# Patient Record
Sex: Female | Born: 1940
Health system: Southern US, Community
[De-identification: ages and names within clinical notes are randomized; demographics above are authoritative.]

## PROBLEM LIST (undated history)

## (undated) DIAGNOSIS — K219 Gastro-esophageal reflux disease without esophagitis: Secondary | ICD-10-CM

## (undated) DIAGNOSIS — M8580 Other specified disorders of bone density and structure, unspecified site: Secondary | ICD-10-CM

## (undated) DIAGNOSIS — N189 Chronic kidney disease, unspecified: Secondary | ICD-10-CM

## (undated) DIAGNOSIS — E785 Hyperlipidemia, unspecified: Secondary | ICD-10-CM

## (undated) DIAGNOSIS — K861 Other chronic pancreatitis: Secondary | ICD-10-CM

## (undated) DIAGNOSIS — T8859XA Other complications of anesthesia, initial encounter: Secondary | ICD-10-CM

## (undated) DIAGNOSIS — E079 Disorder of thyroid, unspecified: Secondary | ICD-10-CM

## (undated) DIAGNOSIS — Z974 Presence of external hearing-aid: Secondary | ICD-10-CM

## (undated) DIAGNOSIS — I10 Essential (primary) hypertension: Secondary | ICD-10-CM

## (undated) DIAGNOSIS — M1990 Unspecified osteoarthritis, unspecified site: Secondary | ICD-10-CM

## (undated) HISTORY — PX: CHOLECYSTECTOMY: SHX55

## (undated) HISTORY — DX: Other chronic pancreatitis: K86.1

## (undated) HISTORY — DX: Hyperlipidemia, unspecified: E78.5

## (undated) HISTORY — PX: TONSILECTOMY, ADENOIDECTOMY, BILATERAL MYRINGOTOMY AND TUBES: SHX2538

## (undated) HISTORY — DX: Other specified disorders of bone density and structure, unspecified site: M85.80

## (undated) HISTORY — PX: TUBAL LIGATION: SHX77

## (undated) HISTORY — DX: Chronic kidney disease, unspecified: N18.9

## (undated) HISTORY — DX: Disorder of thyroid, unspecified: E07.9

## (undated) HISTORY — PX: TONSILLECTOMY AND ADENOIDECTOMY: SHX28

## (undated) HISTORY — DX: Unspecified osteoarthritis, unspecified site: M19.90

## (undated) HISTORY — DX: Gastro-esophageal reflux disease without esophagitis: K21.9

## (undated) HISTORY — PX: APPENDECTOMY: SHX54

## (undated) HISTORY — PX: WHIPPLE PROCEDURE: SHX2667

---

## 1996-08-27 HISTORY — PX: WHIPPLE PROCEDURE: SHX2667

## 1999-03-10 ENCOUNTER — Other Ambulatory Visit: Admission: RE | Admit: 1999-03-10 | Discharge: 1999-03-10 | Payer: Self-pay | Admitting: Internal Medicine

## 2001-07-15 ENCOUNTER — Encounter: Payer: Self-pay | Admitting: *Deleted

## 2001-07-15 ENCOUNTER — Ambulatory Visit (HOSPITAL_COMMUNITY): Admission: RE | Admit: 2001-07-15 | Discharge: 2001-07-15 | Payer: Self-pay | Admitting: *Deleted

## 2002-12-08 ENCOUNTER — Emergency Department (HOSPITAL_COMMUNITY): Admission: EM | Admit: 2002-12-08 | Discharge: 2002-12-08 | Payer: Self-pay | Admitting: Emergency Medicine

## 2002-12-08 ENCOUNTER — Encounter: Payer: Self-pay | Admitting: Emergency Medicine

## 2003-10-26 ENCOUNTER — Encounter: Admission: RE | Admit: 2003-10-26 | Discharge: 2003-10-26 | Payer: Self-pay | Admitting: Family Medicine

## 2003-10-29 ENCOUNTER — Inpatient Hospital Stay (HOSPITAL_COMMUNITY): Admission: EM | Admit: 2003-10-29 | Discharge: 2003-11-01 | Payer: Self-pay | Admitting: Emergency Medicine

## 2003-11-03 ENCOUNTER — Observation Stay (HOSPITAL_COMMUNITY): Admission: RE | Admit: 2003-11-03 | Discharge: 2003-11-04 | Payer: Self-pay | Admitting: *Deleted

## 2003-11-04 ENCOUNTER — Encounter (INDEPENDENT_AMBULATORY_CARE_PROVIDER_SITE_OTHER): Payer: Self-pay | Admitting: *Deleted

## 2005-03-28 ENCOUNTER — Emergency Department (HOSPITAL_COMMUNITY): Admission: EM | Admit: 2005-03-28 | Discharge: 2005-03-28 | Payer: Self-pay | Admitting: Emergency Medicine

## 2005-10-23 ENCOUNTER — Encounter: Admission: RE | Admit: 2005-10-23 | Discharge: 2006-01-21 | Payer: Self-pay | Admitting: Family Medicine

## 2006-02-06 ENCOUNTER — Ambulatory Visit: Payer: Self-pay | Admitting: Family Medicine

## 2006-05-14 ENCOUNTER — Ambulatory Visit: Payer: Self-pay | Admitting: Family Medicine

## 2006-07-19 ENCOUNTER — Ambulatory Visit: Payer: Self-pay | Admitting: Family Medicine

## 2006-08-06 ENCOUNTER — Ambulatory Visit: Payer: Self-pay | Admitting: Family Medicine

## 2006-08-21 ENCOUNTER — Ambulatory Visit: Payer: Self-pay | Admitting: Family Medicine

## 2006-09-24 ENCOUNTER — Encounter: Admission: RE | Admit: 2006-09-24 | Discharge: 2006-12-23 | Payer: Self-pay | Admitting: Family Medicine

## 2006-10-11 ENCOUNTER — Encounter: Admission: RE | Admit: 2006-10-11 | Discharge: 2006-10-11 | Payer: Self-pay | Admitting: Physician Assistant

## 2006-10-11 ENCOUNTER — Ambulatory Visit: Payer: Self-pay | Admitting: Family Medicine

## 2006-11-13 ENCOUNTER — Ambulatory Visit: Payer: Self-pay | Admitting: Family Medicine

## 2006-12-18 ENCOUNTER — Ambulatory Visit: Payer: Self-pay | Admitting: Family Medicine

## 2007-01-28 ENCOUNTER — Ambulatory Visit: Payer: Self-pay | Admitting: Family Medicine

## 2007-02-19 ENCOUNTER — Ambulatory Visit: Payer: Self-pay | Admitting: Family Medicine

## 2007-04-23 ENCOUNTER — Ambulatory Visit: Payer: Self-pay | Admitting: Family Medicine

## 2007-07-21 ENCOUNTER — Ambulatory Visit: Payer: Self-pay | Admitting: Family Medicine

## 2007-07-30 ENCOUNTER — Ambulatory Visit: Payer: Self-pay | Admitting: Family Medicine

## 2007-08-15 ENCOUNTER — Ambulatory Visit: Payer: Self-pay | Admitting: Family Medicine

## 2007-10-29 ENCOUNTER — Ambulatory Visit: Payer: Self-pay | Admitting: Family Medicine

## 2008-01-28 ENCOUNTER — Ambulatory Visit: Payer: Self-pay | Admitting: Family Medicine

## 2008-02-16 ENCOUNTER — Ambulatory Visit (HOSPITAL_BASED_OUTPATIENT_CLINIC_OR_DEPARTMENT_OTHER): Admission: RE | Admit: 2008-02-16 | Discharge: 2008-02-17 | Payer: Self-pay | Admitting: Orthopedic Surgery

## 2008-03-19 ENCOUNTER — Ambulatory Visit (HOSPITAL_COMMUNITY): Admission: RE | Admit: 2008-03-19 | Discharge: 2008-03-19 | Payer: Self-pay | Admitting: Orthopedic Surgery

## 2008-05-05 ENCOUNTER — Ambulatory Visit: Payer: Self-pay | Admitting: Family Medicine

## 2008-08-24 ENCOUNTER — Ambulatory Visit: Payer: Self-pay | Admitting: Family Medicine

## 2008-09-07 LAB — HM DEXA SCAN

## 2008-10-20 ENCOUNTER — Inpatient Hospital Stay (HOSPITAL_COMMUNITY): Admission: EM | Admit: 2008-10-20 | Discharge: 2008-10-23 | Payer: Self-pay | Admitting: Emergency Medicine

## 2008-11-08 ENCOUNTER — Ambulatory Visit: Payer: Self-pay | Admitting: Family Medicine

## 2008-11-19 ENCOUNTER — Encounter (INDEPENDENT_AMBULATORY_CARE_PROVIDER_SITE_OTHER): Payer: Self-pay | Admitting: Internal Medicine

## 2008-12-22 ENCOUNTER — Ambulatory Visit: Payer: Self-pay | Admitting: Family Medicine

## 2008-12-24 ENCOUNTER — Ambulatory Visit: Payer: Self-pay | Admitting: Family Medicine

## 2009-01-12 ENCOUNTER — Ambulatory Visit: Payer: Self-pay | Admitting: Family Medicine

## 2009-01-26 ENCOUNTER — Ambulatory Visit: Payer: Self-pay | Admitting: Family Medicine

## 2009-04-25 ENCOUNTER — Ambulatory Visit: Payer: Self-pay | Admitting: Family Medicine

## 2009-07-25 ENCOUNTER — Ambulatory Visit: Payer: Self-pay | Admitting: Family Medicine

## 2009-09-14 ENCOUNTER — Encounter: Admission: RE | Admit: 2009-09-14 | Discharge: 2009-09-14 | Payer: Self-pay | Admitting: Family Medicine

## 2009-09-14 ENCOUNTER — Ambulatory Visit: Payer: Self-pay | Admitting: Family Medicine

## 2009-10-24 ENCOUNTER — Ambulatory Visit: Payer: Self-pay | Admitting: Family Medicine

## 2009-11-08 ENCOUNTER — Encounter: Admission: RE | Admit: 2009-11-08 | Discharge: 2009-11-08 | Payer: Self-pay | Admitting: Otolaryngology

## 2009-11-16 ENCOUNTER — Ambulatory Visit: Payer: Self-pay | Admitting: Family Medicine

## 2010-01-19 ENCOUNTER — Ambulatory Visit: Payer: Self-pay | Admitting: Family Medicine

## 2010-04-26 ENCOUNTER — Ambulatory Visit: Payer: Self-pay | Admitting: Physician Assistant

## 2010-09-17 ENCOUNTER — Encounter: Payer: Self-pay | Admitting: Neurology

## 2010-09-19 ENCOUNTER — Ambulatory Visit
Admission: RE | Admit: 2010-09-19 | Discharge: 2010-09-19 | Payer: Self-pay | Source: Home / Self Care | Attending: Family Medicine | Admitting: Family Medicine

## 2010-09-20 ENCOUNTER — Ambulatory Visit
Admission: RE | Admit: 2010-09-20 | Discharge: 2010-09-20 | Payer: Self-pay | Source: Home / Self Care | Attending: Family Medicine | Admitting: Family Medicine

## 2010-12-12 LAB — POCT I-STAT, CHEM 8
BUN: 32 mg/dL — ABNORMAL HIGH (ref 6–23)
Calcium, Ion: 1.19 mmol/L (ref 1.12–1.32)
Creatinine, Ser: 1.1 mg/dL (ref 0.4–1.2)
Glucose, Bld: 192 mg/dL — ABNORMAL HIGH (ref 70–99)
HCT: 35 % — ABNORMAL LOW (ref 36.0–46.0)
Hemoglobin: 11.9 g/dL — ABNORMAL LOW (ref 12.0–15.0)
Potassium: 4.3 mEq/L (ref 3.5–5.1)
TCO2: 20 mmol/L (ref 0–100)

## 2010-12-12 LAB — GLUCOSE, CAPILLARY
Glucose-Capillary: 100 mg/dL — ABNORMAL HIGH (ref 70–99)
Glucose-Capillary: 111 mg/dL — ABNORMAL HIGH (ref 70–99)
Glucose-Capillary: 123 mg/dL — ABNORMAL HIGH (ref 70–99)
Glucose-Capillary: 37 mg/dL — CL (ref 70–99)
Glucose-Capillary: 40 mg/dL — ABNORMAL LOW (ref 70–99)
Glucose-Capillary: 46 mg/dL — ABNORMAL LOW (ref 70–99)
Glucose-Capillary: 76 mg/dL (ref 70–99)
Glucose-Capillary: 78 mg/dL (ref 70–99)
Glucose-Capillary: 94 mg/dL (ref 70–99)
Glucose-Capillary: 96 mg/dL (ref 70–99)

## 2010-12-12 LAB — HEMOGLOBIN AND HEMATOCRIT, BLOOD
HCT: 24.8 % — ABNORMAL LOW (ref 36.0–46.0)
HCT: 26.7 % — ABNORMAL LOW (ref 36.0–46.0)
HCT: 29.9 % — ABNORMAL LOW (ref 36.0–46.0)
HCT: 30.2 % — ABNORMAL LOW (ref 36.0–46.0)
HCT: 34.1 % — ABNORMAL LOW (ref 36.0–46.0)
Hemoglobin: 10.1 g/dL — ABNORMAL LOW (ref 12.0–15.0)
Hemoglobin: 10.2 g/dL — ABNORMAL LOW (ref 12.0–15.0)
Hemoglobin: 11.7 g/dL — ABNORMAL LOW (ref 12.0–15.0)
Hemoglobin: 11.7 g/dL — ABNORMAL LOW (ref 12.0–15.0)

## 2010-12-12 LAB — BASIC METABOLIC PANEL
BUN: 6 mg/dL (ref 6–23)
CO2: 19 mEq/L (ref 19–32)
CO2: 21 mEq/L (ref 19–32)
Calcium: 7.8 mg/dL — ABNORMAL LOW (ref 8.4–10.5)
Calcium: 8 mg/dL — ABNORMAL LOW (ref 8.4–10.5)
Chloride: 111 mEq/L (ref 96–112)
Chloride: 116 mEq/L — ABNORMAL HIGH (ref 96–112)
Creatinine, Ser: 0.7 mg/dL (ref 0.4–1.2)
Creatinine, Ser: 0.89 mg/dL (ref 0.4–1.2)
GFR calc Af Amer: >60 mL/min (ref >60)
GFR calc non Af Amer: >60 mL/min (ref >60)
GFR calc non Af Amer: >60 mL/min (ref >60)
Glucose, Bld: 132 mg/dL — ABNORMAL HIGH (ref 70–99)
Glucose, Bld: 148 mg/dL — ABNORMAL HIGH (ref 70–99)
Potassium: 4.9 mEq/L (ref 3.5–5.1)
Sodium: 139 mEq/L (ref 135–145)
Sodium: 140 mEq/L (ref 135–145)

## 2010-12-12 LAB — CBC
HCT: 25.1 % — ABNORMAL LOW (ref 36.0–46.0)
HCT: 29 % — ABNORMAL LOW (ref 36.0–46.0)
HCT: 32.5 % — ABNORMAL LOW (ref 36.0–46.0)
Hemoglobin: 11.4 g/dL — ABNORMAL LOW (ref 12.0–15.0)
Hemoglobin: 8.8 g/dL — ABNORMAL LOW (ref 12.0–15.0)
Hemoglobin: 9.8 g/dL — ABNORMAL LOW (ref 12.0–15.0)
Hemoglobin: 9.8 g/dL — ABNORMAL LOW (ref 12.0–15.0)
MCV: 94.4 fL (ref 78.0–100.0)
Platelets: 203 10*3/uL (ref 150–400)
Platelets: 282 10*3/uL (ref 150–400)
RBC: 2.61 MIL/uL — ABNORMAL LOW (ref 3.87–5.11)
RBC: 3.26 MIL/uL — ABNORMAL LOW (ref 3.87–5.11)
RBC: 3.39 MIL/uL — ABNORMAL LOW (ref 3.87–5.11)
RDW: 13.1 % (ref 11.5–15.5)
RDW: 13.6 % (ref 11.5–15.5)
RDW: 16.9 % — ABNORMAL HIGH (ref 11.5–15.5)
WBC: 10 10*3/uL (ref 4.0–10.5)
WBC: 12.6 10*3/uL — ABNORMAL HIGH (ref 4.0–10.5)
WBC: 6.9 10*3/uL (ref 4.0–10.5)
WBC: 8.6 10*3/uL (ref 4.0–10.5)

## 2010-12-12 LAB — HEMOCCULT GUIAC POC 1CARD (OFFICE): Fecal Occult Bld: POSITIVE

## 2010-12-12 LAB — CLOTEST (H. PYLORI), BIOPSY: Helicobacter screen: NEGATIVE

## 2010-12-12 LAB — DIFFERENTIAL
Basophils Absolute: 0 10*3/uL (ref 0.0–0.1)
Basophils Relative: 0 % (ref 0–1)
Eosinophils Relative: 2 % (ref 0–5)
Monocytes Absolute: 0.6 10*3/uL (ref 0.1–1.0)
Monocytes Relative: 7 % (ref 3–12)

## 2010-12-12 LAB — RETICULOCYTES: Retic Count, Absolute: 54.7 10*3/uL (ref 19.0–186.0)

## 2010-12-12 LAB — CROSSMATCH
ABO/RH(D): A POS
Antibody Screen: NEGATIVE

## 2010-12-12 LAB — COMPREHENSIVE METABOLIC PANEL
ALT: 21 U/L (ref 0–35)
AST: 22 U/L (ref 0–37)
Albumin: 2.6 g/dL — ABNORMAL LOW (ref 3.5–5.2)
Alkaline Phosphatase: 73 U/L (ref 39–117)
Chloride: 114 mEq/L — ABNORMAL HIGH (ref 96–112)
GFR calc Af Amer: >60 mL/min (ref >60)
Potassium: 4.2 mEq/L (ref 3.5–5.1)
Sodium: 138 mEq/L (ref 135–145)
Total Bilirubin: 0.3 mg/dL (ref 0.3–1.2)
Total Protein: 4.7 g/dL — ABNORMAL LOW (ref 6.0–8.3)

## 2010-12-12 LAB — CK TOTAL AND CKMB (NOT AT ARMC)
CK, MB: 1.9 ng/mL (ref 0.3–4.0)
CK, MB: 2.8 ng/mL (ref 0.3–4.0)
Relative Index: INVALID (ref 0.0–2.5)
Relative Index: INVALID (ref 0.0–2.5)
Total CK: 37 U/L (ref 7–177)

## 2010-12-12 LAB — POCT CARDIAC MARKERS
CKMB, poc: <1 ng/mL — ABNORMAL LOW (ref 1.0–8.0)
Troponin i, poc: <0.05 ng/mL (ref 0.00–0.09)

## 2010-12-12 LAB — TROPONIN I: Troponin I: <0.01 ng/mL (ref 0.00–0.06)

## 2010-12-12 LAB — VITAMIN B12: Vitamin B-12: 689 pg/mL (ref 211–911)

## 2010-12-12 LAB — LIPID PANEL
LDL Cholesterol: 44 mg/dL (ref 0–99)
VLDL: 26 mg/dL (ref 0–40)

## 2010-12-12 LAB — HEMOGLOBIN A1C: Hgb A1c MFr Bld: 7.4 % — ABNORMAL HIGH (ref 4.6–6.1)

## 2010-12-19 ENCOUNTER — Other Ambulatory Visit: Payer: Self-pay | Admitting: Family Medicine

## 2010-12-19 ENCOUNTER — Ambulatory Visit (INDEPENDENT_AMBULATORY_CARE_PROVIDER_SITE_OTHER): Payer: MEDICARE | Admitting: Family Medicine

## 2010-12-19 DIAGNOSIS — I10 Essential (primary) hypertension: Secondary | ICD-10-CM

## 2010-12-19 DIAGNOSIS — Z79899 Other long term (current) drug therapy: Secondary | ICD-10-CM

## 2010-12-19 DIAGNOSIS — E119 Type 2 diabetes mellitus without complications: Secondary | ICD-10-CM

## 2010-12-19 DIAGNOSIS — M899 Disorder of bone, unspecified: Secondary | ICD-10-CM

## 2010-12-19 DIAGNOSIS — E785 Hyperlipidemia, unspecified: Secondary | ICD-10-CM

## 2010-12-20 ENCOUNTER — Emergency Department (HOSPITAL_COMMUNITY)
Admission: EM | Admit: 2010-12-20 | Discharge: 2010-12-20 | Disposition: A | Discharge status: Home / Self Care | Payer: Medicare Other | Attending: Emergency Medicine | Admitting: Emergency Medicine

## 2010-12-20 ENCOUNTER — Emergency Department (HOSPITAL_COMMUNITY): Payer: Medicare Other

## 2010-12-20 ENCOUNTER — Encounter (HOSPITAL_COMMUNITY): Payer: Self-pay | Admitting: Radiology

## 2010-12-20 DIAGNOSIS — E039 Hypothyroidism, unspecified: Secondary | ICD-10-CM | POA: Insufficient documentation

## 2010-12-20 DIAGNOSIS — N1330 Unspecified hydronephrosis: Secondary | ICD-10-CM | POA: Insufficient documentation

## 2010-12-20 DIAGNOSIS — I10 Essential (primary) hypertension: Secondary | ICD-10-CM | POA: Insufficient documentation

## 2010-12-20 DIAGNOSIS — N200 Calculus of kidney: Secondary | ICD-10-CM | POA: Insufficient documentation

## 2010-12-20 DIAGNOSIS — E109 Type 1 diabetes mellitus without complications: Secondary | ICD-10-CM | POA: Insufficient documentation

## 2010-12-20 DIAGNOSIS — R109 Unspecified abdominal pain: Secondary | ICD-10-CM | POA: Insufficient documentation

## 2010-12-20 DIAGNOSIS — D72829 Elevated white blood cell count, unspecified: Secondary | ICD-10-CM | POA: Insufficient documentation

## 2010-12-20 DIAGNOSIS — R1032 Left lower quadrant pain: Secondary | ICD-10-CM | POA: Insufficient documentation

## 2010-12-20 HISTORY — DX: Essential (primary) hypertension: I10

## 2010-12-20 LAB — DIFFERENTIAL
Band Neutrophils: 2 % (ref 0–10)
Basophils Absolute: 0 10*3/uL (ref 0.0–0.1)
Basophils Relative: 0 % (ref 0–1)
Blasts: 0 %
Eosinophils Absolute: 0 10*3/uL (ref 0.0–0.7)
Eosinophils Relative: 0 % (ref 0–5)
Lymphocytes Relative: 17 % (ref 12–46)
Lymphs Abs: 2.1 10*3/uL (ref 0.7–4.0)
Metamyelocytes Relative: 0 %
Monocytes Absolute: 0.1 10*3/uL (ref 0.1–1.0)
Monocytes Relative: 1 % — ABNORMAL LOW (ref 3–12)
Myelocytes: 0 %
Neutro Abs: 10.4 10*3/uL — ABNORMAL HIGH (ref 1.7–7.7)
Neutrophils Relative %: 80 % — ABNORMAL HIGH (ref 43–77)
Promyelocytes Absolute: 0 %
nRBC: 0 /100{WBCs}

## 2010-12-20 LAB — URINALYSIS, ROUTINE W REFLEX MICROSCOPIC
Nitrite: NEGATIVE
Specific Gravity, Urine: 1.019 (ref 1.005–1.030)
Urobilinogen, UA: 0.2 mg/dL (ref 0.0–1.0)
pH: 5 (ref 5.0–8.0)

## 2010-12-20 LAB — POCT I-STAT, CHEM 8
BUN: 25 mg/dL — ABNORMAL HIGH (ref 6–23)
Chloride: 107 mEq/L (ref 96–112)
Potassium: 4.1 mEq/L (ref 3.5–5.1)
Sodium: 139 mEq/L (ref 135–145)
TCO2: 19 mmol/L (ref 0–100)

## 2010-12-20 LAB — URINE MICROSCOPIC-ADD ON

## 2010-12-20 LAB — CBC
Platelets: 256 10*3/uL (ref 150–400)
RBC: 3.89 MIL/uL (ref 3.87–5.11)
WBC: 12.6 10*3/uL — ABNORMAL HIGH (ref 4.0–10.5)

## 2011-01-09 NOTE — Op Note (Signed)
NAME:  Deborah Travis, Deborah Travis                ACCOUNT NO.:  [id number]   MEDICAL RECORD NO.:  [id number]          PATIENT TYPE:  AMB   LOCATION:  DSC                          FACILITY:  [facility]   PHYSICIAN:  Marlowe Kays, M.D.  DATE OF BIRTH:  [birth year]   DATE OF PROCEDURE:  02/16/2008  DATE OF DISCHARGE:                               OPERATIVE REPORT   PREOPERATIVE DIAGNOSIS:  Painful bunion with metatarsus primus varus and  hallux valgus deformities, right foot.   POSTOPERATIVE DIAGNOSIS:  Painful bunion with metatarsus primus varus  and hallux valgus deformities, right foot.   OPERATION:  Funk bunionectomy, right foot.   SURGEON:  Marlowe Kays, MD   ASSISTANT:  Nurse.   ANESTHESIA:  General.   JUSTIFICATION FOR PROCEDURE:  She has had a progressive problem with  painful bunion on the right foot.  It has been associated with some  slight hallux valgus which is passively correctable and roughly a 15-  degree first and second metatarsal ankle on the standing films.  The MP  joint of her great toe looked good.  It was felt that the above  bunionectomy would be appropriate for this type of deformity.   PROCEDURE IN DETAIL:  Satisfactory general anesthesia, pneumatic  tourniquet inflated to 300 mmHg after elevating the leg, the right foot  and ankle prepped with DuraPrep and draped in sterile field.  Time-out  performed.  I marked out a incision along the inner border of the distal  foot extending from the distal first metatarsal over the bunion and over  the proximal phalanx.  The incision was carried down to the underlying  capsule with care to protect the dorsal sensory nerve.  I then opened  the capsule, the flap based distally exposing the large bunion which I  removed with a micro saw.  I then measured 1 cm from the articular  surface proximal ward and made a scribe line on the cancellous bone with  a cautery and measured an additional 6 mm proximally and marked a second  line.   At the more proximal line, I then made a cut perpendicular to the  bone preserving the lateral cortex and then made an oblique cut through  the distal line, again preserving the lateral cortex.  I then perforated  the lateral cortex with a small osteotome and gently cracked down the  osteotomy.  I did a little additional tailoring of the bunion deformity  and at the conclusion, the great toe was straight with hallux valgus  corrected and the bunionectomy area was nice and smooth and in line with  the first metatarsal shaft.  I irrigated the wound well with sterile  saline and closed the capsular flap proximally first with interrupted 0  Vicryl and then along the perimeter.  Subcutaneous tissue was closed and  skin was closed as a unit with interrupted 4-0 nylon mattress sutures.  The toe was blocked with 0.5%  plain Marcaine.  Betadine dry dressing with a sterile padded tongue  blade along the great toe and metatarsal was applied.  Tourniquet was  released.  She tolerated the procedure well and was taken to recovery in  satisfactory condition with no known complications.           ______________________________  Marlowe Kays, M.D.     JA/MEDQ  D:  02/16/2008  T:  02/17/2008  Job:  [id number]

## 2011-01-09 NOTE — Consult Note (Signed)
NAME:  Deborah Travis, Deborah Travis                ACCOUNT NO.:  [id number]   MEDICAL RECORD NO.:  [id number]          PATIENT TYPE:  OBV   LOCATION:  3705                         FACILITY:  [facility]   PHYSICIAN:  Graylin Shiver, M.D.   DATE OF BIRTH:  09/07/1940   DATE OF CONSULTATION:  10/21/2008  DATE OF DISCHARGE:                                 CONSULTATION   We were asked to see Deborah Travis today in consultation for GI and heme-  positive anemia by Dr. Peggye Pitt of [facility].   HISTORY OF PRESENT ILLNESS:  This is a very pleasant 70 year old female  who was admitted with chest pain.  Her cardiac enzymes are negative.  She tells me she has had black stools times past 2 days, and she had  some chronic epigastric pain.  Her appetite is good.  She describes no  weight loss.  No NSAID use.  No vomiting.  No constipation.  She has a  significant GI history of recurrent ulcers and idiopathic pancreatitis  for which she had a Whipple procedure.  The patient is also due for a  screening colon.  Previously, she had a tubulovillous adenoma on  colonoscopy in 2005.  Her last endoscopy was also in 2005 and 2 duodenal  ulcers were found by Dr. Roosvelt Harps.  Her PCP is Dr. Sharlot Gowda.   PAST MEDICAL HISTORY:  Significant for recurrent pancreatitis, status  post Whipple, hypothyroidism, hypertension, hyperlipidemia, and  recurrent gastric and duodenal ulcers.  She is status post  cholecystectomy.  She developed diabetes mellitus 8 years after her  Whipple procedure.   CURRENT MEDICATIONS:  Fosamax, vitamin D, lisinopril,  hydrochlorothiazide, NovoLog, metformin, calcium, multivitamin,  pravastatin, and levothyroxine.   ALLERGIES:  CODEINE and MORPHINE.   SOCIAL HISTORY:  Negative for alcohol, tobacco, and drugs.   FAMILY HISTORY:  Negative for colon cancer, biliary disease,  pancreatitis, and ulcers.   REVIEW OF SYSTEMS:  Negative except for HPI.   PHYSICAL EXAMINATION:  GENERAL:  She is  alert and oriented in no  apparent distress.  She is very pleasant to speak.  VITAL SIGNS:  Temperature 97.1, pulse 76, respirations 18, and blood  pressure 89/60.  HEAR:  Regular rate and rhythm.  LUNGS:  Clear to auscultation.  ABDOMEN:  Nontender and nondistended with good bowel sounds.   LABORATORY DATA:  Hemoglobin is currently 8.5, is down from a 11.4 on  admission.  Her MCV value was 96, BUN 11, creatinine 0.8, hematocrit  24.8, white count 8.6, platelets 197,000, ferritin 47, TIBC 260% and sat  35%.  Chest x-ray shows prominence of the right paratracheal tissues.   ASSESSMENT:  Dr. Herbert Moors has seen and examined the patient,  collected a history, and reviewed the chart.  His impression is that  this is a pleasant 70 year old female with heme-positive anemia, history  of ulcers, possible tubulovillous adenoma.   PLAN:  We will plan for endoscopy and colonoscopy on October 22, 2008,  at approximately 11:45 a.m.      Stephani Police, PA    ______________________________  Karel Jarvis  Leafy Ro, M.D.    MLY/MEDQ  D:  10/21/2008  T:  10/22/2008  Job:  [id number]   cc:   Everardo All. Madilyn Fireman, M.D.  Eduardo Osier. Sharyn Lull, M.D.

## 2011-01-09 NOTE — Op Note (Signed)
NAME:  Deborah Travis, Deborah Travis                ACCOUNT NO.:  [id number]   MEDICAL RECORD NO.:  [id number]          PATIENT TYPE:  AMB   LOCATION:  DAY                          FACILITY:  [facility]   PHYSICIAN:  Marlowe Kays, M.D.  DATE OF BIRTH:  June 30, 1941   DATE OF PROCEDURE:  03/19/2008  DATE OF DISCHARGE:                               OPERATIVE REPORT   PREOPERATIVE DIAGNOSIS:  Painful bunion, metatarsus primus varus and  hallux valgus deformities of left foot.   POSTOPERATIVE DIAGNOSIS:  Painful bunion, metatarsus primus varus and  hallux valgus deformities of left foot.   OPERATION PERFORMED:  Alfred Levins bunionectomy of the left foot.   SURGEON:  [place].   ASSISTANT:  None.   ANESTHESIA:  General.   PATHOLOGY AND INDICATIONS FOR PROCEDURE:  She has a 15-degree first and  second metatarsal angle with painful bunion and hallux valgus deformity  which is passively correctable.  I performed a successful Funk  bunionectomy on her right foot a little over a month ago, with a nice  result.   DESCRIPTION OF PROCEDURE:  Satisfactory general anesthesia, pneumatic  tourniquet left lower extremity with the leg esmarched out nonsterilely  inflated to 300 mmHg.  DuraPrep from midcalf to toes and the leg was  draped in a sterile field.  Time-out performed.  I made a dorsal medial  incision extending from just over the proximal phalanx of the big toe  proximally to the distal metatarsal.  The incision was carried down to  the joint capsule, protecting the dorsal sensory nerve.  The capsule was  opened with a flap based distally to expose a large bunion, which was  removed with a combination of osteotome and rongeur.  I then measured a  centimeter proximal from the articular surface and made a scribe line on  the cut bone with a cautery and then measured and an additional 6 mm  proximal to that, with another scribe line made.  On the more proximal  line I then used a micro saw to make a cut  perpendicular to the bone,  leaving the lateral cortex intact.  Distally I then made an oblique cut,  again, leaving the lateral cortex intact and removing the wedge of bone.  I then perforated the lateral cortex with a micro osteotome and closed  the osteotomy without complication.  This seemed to give a nice  correction to the deformity.  I then trimmed up around the bunion area  with a small rongeur and after irrigating the wound well and placing a  little cancellus bone graft at the osteotomy site, I held the great toe  in a corrected position and closed the flap with interrupted 0 Vicryl.  The skin and subcutaneous tissues were then closed with interrupted 4-0  nylon mattress sutures.  Adaptic and a dry, sterile  dressing were applied with a well-padded sterile tongue blade along the  medial toe and foot.  The tourniquet was released.  She tolerated the  procedure well and was taken to recovery in satisfactory condition with  no complications.  ______________________________  Marlowe Kays, M.D.     JA/MEDQ  D:  03/19/2008  T:  03/19/2008  Job:  [id number]

## 2011-01-09 NOTE — Op Note (Signed)
NAME:  Deborah Travis, Deborah Travis                ACCOUNT NO.:  [id number]   MEDICAL RECORD NO.:  [id number]          PATIENT TYPE:  INP   LOCATION:  3705                         FACILITY:  [facility]   PHYSICIAN:  Graylin Shiver, M.D.   DATE OF BIRTH:  November 03, 1940   DATE OF PROCEDURE:  10/22/2008  DATE OF DISCHARGE:                               OPERATIVE REPORT   INDICATIONS FOR PROCEDURE:  Anemia, rule out upper GI lesion.   Informed consent was obtained after explanation of the risks of  bleeding, infection, and perforation.   PREMEDICATION:  1. Fentanyl 62.5 mcg IV.  2. Versed 6 mg IV.   PROCEDURE:  With the patient in the left lateral decubitus position, the  Pentax gastroscope was inserted into the oropharynx and passed into the  esophagus.  It was advanced down the esophagus, then into the stomach  and into the duodenum.  The second portion of the duodenum looked  normal.  In the immediate postbulbar area, there was a 1-cm postbulbar  duodenal ulcer.  The duodenal bulb looked fine.  The stomach looked  normal.  Biopsy for CLO test was obtained.  The esophagus looked normal.  She tolerated the procedure well without complications.   IMPRESSION:  1-cm postbulbar duodenal ulcer.   RECOMMENDATIONS:  I would recommend she be on a proton pump inhibitor  and the CLO test will be checked.           ______________________________  Graylin Shiver, M.D.     SFG/MEDQ  D:  10/22/2008  T:  10/23/2008  Job:  [id number]   cc:   [facility]

## 2011-01-09 NOTE — Consult Note (Signed)
NAME:  Deborah Travis, Deborah Travis                ACCOUNT NO.:  [id number]   MEDICAL RECORD NO.:  [id number]          PATIENT TYPE:  OBV   LOCATION:  3705                         FACILITY:  [facility]   PHYSICIAN:  Cassell Clement, M.D. DATE OF BIRTH:  [birth year]   DATE OF CONSULTATION:  10/20/2008  DATE OF DISCHARGE:                                 CONSULTATION   I was asked to see this patient at the request of [facility] regarding  possible coronary artery disease.  This is a pleasant 70 year old  Caucasian female who is a patient of Dr. Sharlot Gowda.  She was admitted  on October 19, 2008, with chest pain and weakness and near syncope.  She does not have any history of known heart problems.  Yesterday, she  was lying on her couch resting and when she got up, she felt a little  weak and had a twinge of chest tightness.  Her symptoms passed and she  had made previous arrangements to meet a friend while walking her dog,  so she proceeded to do that.  It involved walking up hill and while  walking, she noted a tightness in her chest and extreme weakness and  dyspnea.  They walked a short distance, perhaps a block together and the  patient decided she could not walk any further and needed to go home,  but was too weak to walk home, so the friend called 911.  Prior to the  EMS arriving, the patient was very shaky and clammy, but by the time EMS  arrived, she actually was feeling better.  She was brought to the  emergency room where she was evaluated and admitted.  Of note is the  fact that she does not have any history of prior cardiac workup.  She  does have insulin-dependent diabetes for about 4 years.  She also has a  past history of bleeding ulcers.  She has not been experiencing any  recent epigastric pain, but her stools have been dark since October 18, 2008.  In the emergency room, her stool was Hemoccult positive and she  was found to be anemic.   Her family history reveals that her father died  at 29 of heart disease.  Mother died at 85 of a stroke.  She has an older sister, who does not  have any heart trouble.   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  She quit smoking about 5  months ago.  She does not use alcohol.  She is divorced.  A grandson  lives with her.   Past medical history shows that she had a Whipple procedure for  relapsing pancreatitis by Dr. Gerrit Friends several years ago.  She has had a  history of hypothyroidism and has been on thyroid medication for about  10 years.  She has a history of hypercholesterolemia and hypertension.   Her review of systems reveals no recent bronchitis.  She does have a dry  cough, which she attributes to her lisinopril, but she does not think it  is a problem to want to switch to something else.  All other systems  negative in detail.   Her home medications:  1. Lisinopril HCT.  2. Pravastatin.  3. Metformin.  4. Levothyroxine.  5. Vitamin D.  6. Fosamax.  7. Calcium 1200 mg daily.  8. Vitamin B complex daily.  9. Multivitamin daily.   PHYSICAL EXAMINATION:  VITAL SIGNS:  This afternoon, her blood pressure  is low at 93/52, her pulse is 74, temperature 98.  GENERAL:  This is a pale, middle-aged woman in no acute distress.  HEENT:  Pupils were equal and reactive.  Sclerae nonicteric.  NECK:  Jugular venous pressure is normal.  Carotids are normal.  Thyroid  is not enlarged.  The patient has no lymphadenopathy.  CHEST:  Clear to  auscultation.  HEART:  No murmur, gallop, rub, or click.  ABDOMEN:  Soft without hepatosplenomegaly or masses.  EXTREMITIES:  Good peripheral pulses.  No phlebitis or edema.  SKIN:  Pale, but there is no skin rash.  NEUROLOGIC:  Physiologic.   Her electrocardiogram is normal.  Her chest x-ray has no active disease.   Pertinent labs include hemoglobin on admission 11.4 down to 9.6 today.  Her stool was positive hemoccult.   She has troponins of 0.02 and 0.62.  CK-MBs are normal at 1.5 and 2.8.  Cholesterol was 104 with LDL of 44.   IMPRESSION:  1. Chest pain, possible angina brought on by stress of anemia and      gastrointestinal bleed, but more likely, the chest pain is related      to the gastrointestinal bleed with hypotension itself.  No evidence      at this point of an acute myocardial infarction.  Her EKG is      totally normal and the slight bump in her troponin with normal CK-      MBs would be consistent with hypotension secondary to her      gastrointestinal bleed.  2. She does have multiple risk factors for coronary artery disease      including family history, diabetes, hypertension, prior      hypercholesterolemia, and smoking and after she is stable from      gastrointestinal standpoint, we will want to consider outpatient      Cardiolite stress testing on a later date.   RECOMMENDATIONS:  At this point, no inpatient stress testing is  indicated.  She may need a GI consult and possible consideration of  endoscopy looking for a bleeding source.  We will plan for an outpatient  stress Cardiolite at a later date.  We will follow with you during this  hospitalization to help coordinate that.           ______________________________  Cassell Clement, M.D.     TB/MEDQ  D:  10/20/2008  T:  10/21/2008  Job:  [id number]   cc:   Sharlot Gowda, M.D.  [facility]

## 2011-01-09 NOTE — H&P (Signed)
NAME:  Deborah Travis, Deborah Travis                ACCOUNT NO.:  [id number]   MEDICAL RECORD NO.:  [id number]          PATIENT TYPE:  EMS   LOCATION:  MAJO                         FACILITY:  [facility]   PHYSICIAN:  Vania Rea, M.D. DATE OF BIRTH:  1940-11-06   DATE OF ADMISSION:  10/20/2008  DATE OF DISCHARGE:                              HISTORY & PHYSICAL   PRIMARY CARE PHYSICIAN:  Sharlot Gowda, M.D.   CHIEF COMPLAINT:  Chest pressure.   HISTORY OF PRESENT ILLNESS:  This is a 70 year old-Caucasian lady with a  history of diabetes and hypertension, also peptic ulcer disease on no  specific treatment who was in a fair state of health and usually goes  walking her dog everyday when the weather is good, who woke up this  afternoon with chest pressure, but thought it would go away  spontaneously and took her dog for a walk.  As she was walking, the  chest pressure got progressively worse.  It was associated with nausea,  dizziness, diaphoresis and a feeling of going to pass out.  Eventually,  she sat down on the ground, and with the rest, her chest pressure  subsided.  A friend who was walking with her called an ambulance, and  she was brought to the emergency room to be evaluated.  She was noted to  be hypotensive with a blood pressure in the 80s, and as a result she  received IV fluids, but she did not receive sublingual nitroglycerin.  In the emergency room, the patient has remained pain free, but the  hospitalist service was called to assist with management.  She has had  no similar history.  She has never had a cardiac work-up.   PAST MEDICAL HISTORY:  1. Diabetes.  2. Hypertension.  3. Hypothyroidism.  4. Peptic ulcer disease status post bleed 4 years ago.  5. History of chronic pancreatitis, resolved status post a partial      Whipple's procedure with preservation of the pylorus in 1998.   MEDICATIONS:  1. Fosamax 70 mg weekly each Wednesday.  2. Vitamin D 50,000 units weekly each  Wednesday.  3. Lisinopril/HCTZ combination one tablet daily.  4. NovoLog 70/30 mix, 10 units twice daily.  5. Metformin 500 mg daily.  6. Calcium citrate 1200 mg daily.  7. Multivitamins one daily.  8. Levothyroxine 100 mcg daily.  9. Pravastatin unknown dose one tablet daily.   ALLERGIES:  1. CODEINE.  2. MORPHINE.   SOCIAL HISTORY:  Denies tobacco, alcohol or illicit drug use.  She is  retired and used to work as an Transport planner.  Apart  from walking her dog in good weather, she does not do any specific form  of exercise.   FAMILY HISTORY:  No family history of cardiac disease.   She does have a chronic cough for many months now, which she feels is  associated with her lisinopril use.  She has been using that for a  little under a year.  She also, for the past 2 weeks, has been having a  runny nose, which she feels is due to  allergies.   REVIEW OF SYSTEMS:  The patient has had 2 episodes of black tarry  stools, one on Monday and one on Tuesday.  Otherwise, other than noted  above, a 10-point review of systems is unremarkable.   PHYSICAL EXAMINATION:  GENERAL:  Slender, small-built, middle-aged  Caucasian lady lying on a stretcher, not in acute distress.  VITAL SIGNS:  Temperature is 97.8, blood pressure in the emergency room  has ranged between 81/59 to a current value of 92/56, her pulse is 70,  respirations 14.  She is saturating at 99% on 2 liters.  HEENT:  Pupils are round and equal.  Mucous membranes are pink and  anicteric.  She is not dehydrated.  No cervical lymphadenopathy or  thyromegaly.  No jugular venous distention.  No carotid bruit.  CHEST:  Clear to auscultation bilaterally.  CARDIOVASCULAR:  Regular rhythm without murmur.  ABDOMEN:  Scaphoid, soft and nontender.  EXTREMITIES:  Without edema.  She has 2+ dorsalis pedis pulses  bilaterally.  CENTRAL NERVOUS SYSTEM:  Cranial nerves II-XII are grossly intact.  She  has no focal neurologic  deficit.   LABORATORY DATA:  White count is 12.6, hemoglobin 11.4, platelets 282.  Her serum chemistry revealed a sodium of 138, potassium 4.3, chloride  109, BUN 32, creatinine 1.1, glucose 192.  Her cardiac enzymes are  completely normal.  Her stool occult blood was positive.   CHEST X-RAY:  No active pulmonary process.  Prominence of the right  paratracheal tissues, which could be due to mediastinal vascular  structures, but adenopathy is not excluded.   ASSESSMENT:  1. Unstable angina in a lady with evidence of some GI bleeding.  Plan      - will admit this lady.  Will hydrate her.  Will check her      hemoglobin every 4 hours and also check cardiac enzymes.  If her      hemoglobin remains stable, will continue the chest pain rule out      pathway, but at some point she will likely need a gastrointestinal      evaluation for GI bleed  2. Diabetes type 2, control unknown.  3. Hypertension, controlled.  4. Cough associated with lisinopril.  5. History of osteoporosis on Fosamax and vitamin D.  Will hold      Fosamax for the time being in view of the history suggestive of GI      bleed.  6. Other plans as per orders.      Vania Rea, M.D.  Electronically Signed     LC/MEDQ  D:  10/20/2008  T:  10/20/2008  Job:  [id number]   cc:   Sharlot Gowda, M.D.  John C. Madilyn Fireman, M.D.

## 2011-01-09 NOTE — Discharge Summary (Signed)
Deborah Travis, Deborah Travis                ACCOUNT NO.:  [id number]   MEDICAL RECORD NO.:  [id number]          PATIENT TYPE:  INP   LOCATION:  3705                         FACILITY:  [facility]   PHYSICIAN:  Lonia Blood, M.D.DATE OF BIRTH:  [birth year]   DATE OF ADMISSION:  10/19/2008  DATE OF DISCHARGE:  10/23/2008                               DISCHARGE SUMMARY   PRIMARY CARE PHYSICIAN:  Sharlot Gowda, M.D.   GI PHYSICIAN:  Dr. Evette Cristal.   CARDIOLOGIST:  Dr. Patty Sermons.   DISCHARGE DIAGNOSES:  1. A 1-cm post bulbar duodenal ulcer.      a.     Biopsy for CLOtest pending at discharge.      b.     Protonix initiated.  2. Iron-deficiency anemia.      a.     Failed inpatient colonoscopy due to poor prep.      b.     To follow up with Dr. Evette Cristal in an outpatient setting to       reschedule colonoscopy.  3. Chest pain.      a.     Ruled out for acute myocardial infarction.      b.     For outpatient stress testing once gastrointestinal issues       stabilized.  4. Acute blood loss anemia.      a.     Mild.      b.     Status post 1 unit packed red blood cells.      c.     Nadir hemoglobin 8.8, with peak hemoglobin 11.7, and       discharge hemoglobin 10.1.  5. Diabetes mellitus.  6. Hypertension.  7. Hypothyroidism.  8. Peptic ulcer disease history with significant gastrointestinal      bleeding approximately 4 years ago.  9. Chronic pancreatitis, status post partial Whipple procedure with      preservation of the pylorus in 1998.   DISCHARGE MEDICATIONS:  1. Levothyroxine 100 mcg p.o. daily.  2. Pravastatin p.o. daily.  3. Fosamax 70 mg p.o. q. Wednesday.  4. Vitamin D 50,000 units p.o. q. Wednesday.  5. Calcium citrate 1200 mg daily.  6. Multivitamin p.o. daily.  7. Lisinopril/HCTZ p.o. daily.  8. NovoLog 70/30 10 units subcu b.i.d.  9. Metformin 500 mg p.o. daily.  10.Protonix 40 mg p.o. daily.   FOLLOW UP:  1. The patient is advised to follow up with Dr. Susann Givens within 7  days'      time.  A CBC is recommended at that time.  Additionally, pathology      results from the [facility] computer system should be assessed to      determine if the patient's CLOtest has returned.  2. The patient is advised to follow up with Dr. Evette Cristal in the      outpatient setting to complete her colonoscopy, which was not able      to be accomplished during her inpatient day.  3. Once the patient's GI issues are stabilized, an estimated time      frame of 15-30 days, the patient  should follow up with Dr.      Patty Sermons in the outpatient setting for scheduling of a cardiac      stress test.   CONSULTATIONS:  1. Dr. Patty Sermons.  2. Dr. Evette Cristal.   PROCEDURES:  1. EGD October 22, 2008 - a 1-cm post post bulbar duodenal ulcer with      biopsies taken for CLOtest.  2. Failed colonoscopy October 22, 2008.   HOSPITAL COURSE:  [name] a very pleasant 70 year old female  who presented to the hospital on October 20, 2008 with complaints of  chest pressure.  She was admitted to the acute units.  Cardiac enzymes  were cycled.  These were unrevealing.  Serial cardiac enzymes were  accomplished and were not acute.  The patient was seen in consultation  by Dr. Patty Sermons.  It was not felt that the patient was suffering with  severe unstable angina pectoris.  Given the patient's newly-discovered  acute GI issues, it was felt that stabilization of these issues was  warranted prior to evaluation.  The patient is felt to be safe and  stable for outpatient cardiac stress testing and this is to be  accomplished once the patient's GI issues have stabilized.   During the patient's hospital stay, she was found to be significantly  anemic, with a hemoglobin nadir of approximately 8.8.  The patient was  guaiac tested and this was found to be positive.  GI was consulted.  The  patient underwent an EGD.  This revealed a duodenal ulcer as noted  above.  The treatment for such will be Protonix  daily without fail.  Attempts were made to proceed with colonoscopy.  Unfortunately, the  patient was not adequately prepped and this had to be aborted.  A biopsy  was taken for CLOtest but the results are not yet available at the time  of her discharge.  After returning to the floor, the patient improved  nicely.  She did receive 1 unit of packed red blood cells during her  hospital stay for her anemia.  The patient's hemoglobin peaked in the  mid 11s but then reached a stable hemoglobin of approximately 10.  She  was able to tolerate p.o. intake without difficulty.  She was stable on  her feet with stable vitals and was therefore cleared for discharge  home.      Lonia Blood, M.D.  Electronically Signed     JTM/MEDQ  D:  10/23/2008  T:  10/23/2008  Job:  [id number]   cc:   Sharlot Gowda, M.D.  Graylin Shiver, M.D.  Cassell Clement, M.D.

## 2011-01-09 NOTE — Op Note (Signed)
NAME:  Deborah Travis, Deborah Travis                ACCOUNT NO.:  [id number]   MEDICAL RECORD NO.:  [id number]          PATIENT TYPE:  INP   LOCATION:  3705                         FACILITY:  [facility]   PHYSICIAN:  Graylin Shiver, M.D.   DATE OF BIRTH:  [birth year]   DATE OF PROCEDURE:  10/22/2008  DATE OF DISCHARGE:                               OPERATIVE REPORT   INDICATIONS:  1. Anemia.  2. History of adenomatous colon polyps.   Informed consent was obtained after explanation of the risks of  bleeding, infection, and perforation.   PREMEDICATION:  The procedure was done immediately after an EGD with an  additional dose of fentanyl 12.5 mcg IV.   PROCEDURE:  A colonoscopy was planned.  However, a full colonoscopy  could not be done due to the prep, which was very poor.   With the patient in the left lateral decubitus position, a rectal exam  was performed.  No masses were felt.  The colonoscope was inserted into  the rectum and advanced into the sigmoid colon.  The prep was poor.  There was stool and there was fibrous-appearing vegetable matter.  A  good exam could not be done.  I saw no obvious lesions on this exam, but  a full colonoscopy could not be done.   I have instructed the patient to call for an outpatient followup  colonoscopy after she is discharged from the hospital.           ______________________________  Graylin Shiver, M.D.     SFG/MEDQ  D:  10/22/2008  T:  10/23/2008  Job:  [id number]

## 2011-01-10 ENCOUNTER — Ambulatory Visit (INDEPENDENT_AMBULATORY_CARE_PROVIDER_SITE_OTHER): Payer: Medicare Other | Admitting: Family Medicine

## 2011-01-10 ENCOUNTER — Encounter: Payer: Self-pay | Admitting: Family Medicine

## 2011-01-10 DIAGNOSIS — M79605 Pain in left leg: Secondary | ICD-10-CM

## 2011-01-10 DIAGNOSIS — R109 Unspecified abdominal pain: Secondary | ICD-10-CM

## 2011-01-10 DIAGNOSIS — R1011 Right upper quadrant pain: Secondary | ICD-10-CM

## 2011-01-10 DIAGNOSIS — K279 Peptic ulcer, site unspecified, unspecified as acute or chronic, without hemorrhage or perforation: Secondary | ICD-10-CM

## 2011-01-10 DIAGNOSIS — M79609 Pain in unspecified limb: Secondary | ICD-10-CM

## 2011-01-10 LAB — CBC WITH DIFFERENTIAL/PLATELET
Basophils Absolute: 0 10*3/uL (ref 0.0–0.1)
Basophils Relative: 0 % (ref 0–1)
Eosinophils Absolute: 0.1 10*3/uL (ref 0.0–0.7)
Eosinophils Relative: 1 % (ref 0–5)
HCT: 37.6 % (ref 36.0–46.0)
Lymphocytes Relative: 32 % (ref 12–46)
MCH: 31.4 pg (ref 26.0–34.0)
MCHC: 33.5 g/dL (ref 30.0–36.0)
MCV: 93.8 fL (ref 78.0–100.0)
Monocytes Absolute: 0.6 10*3/uL (ref 0.1–1.0)
RDW: 13.5 % (ref 11.5–15.5)

## 2011-01-10 LAB — COMPREHENSIVE METABOLIC PANEL
BUN: 17 mg/dL (ref 6–23)
CO2: 18 mEq/L — ABNORMAL LOW (ref 19–32)
Glucose, Bld: 128 mg/dL — ABNORMAL HIGH (ref 70–99)
Sodium: 142 mEq/L (ref 135–145)
Total Bilirubin: 0.3 mg/dL (ref 0.3–1.2)
Total Protein: 6.2 g/dL (ref 6.0–8.3)

## 2011-01-10 NOTE — Progress Notes (Signed)
  Subjective:    Patient ID: Deborah Travis, female    DOB: 11-04-1940, 70 y.o.   MRN: [id number]  HPI she is here for evaluation of a ten-day history of right upper cautery and aching sensation associated with nausea. She states that food makes this worse and lasts approximately 3 or 4 hours. She has had no bloating, vomiting, diarrhea, black tarry stools. She has a previous history of pancreatitis and subsequent partial Whipple procedure. She also has a previous history of bleeding ulcers. Gall bladder surgery was over 40 years ago. He presently is on a PPI. He also complains of left hip and thigh pain that lasts approximately 5 seconds. It can occur at any time or any position. She also recently had a bout of renal stones but is doing well at this time. She did not get her DEXA scan done.   Review of Systems Negative except as above    Objective:   Physical Exam alert and in no distress. Cardiac exam shows regular rhythm without murmurs or gallops. Lungs are clear to auscultation. Abdominal exam shows active bowel sounds with right upper quadrant tenderness but no rebound.        Assessment & Plan:  RUQ pain, etiology unclear. Routine blood screening including amylase and lipase. With her previous history of pancreatitis as well as ulcer disease, double referral will be made. No therapy for the hip pain at this time.

## 2011-01-10 NOTE — Patient Instructions (Signed)
Stay on present medications. I will call you with results tomorrow. We will probably refer you back to Dr. Madilyn Fireman.

## 2011-01-12 ENCOUNTER — Telehealth: Payer: Self-pay

## 2011-01-12 NOTE — H&P (Signed)
NAME:  ADELEE, HANNULA                          ACCOUNT NO.:  [id number]   MEDICAL RECORD NO.:  [id number]                   PATIENT TYPE:  OBV   LOCATION:  5731                                 FACILITY:  [facility]   PHYSICIAN:  Althea Grimmer. Luther Parody, M.D.            DATE OF BIRTH:  07-Oct-1940   DATE OF ADMISSION:  11/03/2003  DATE OF DISCHARGE:                                HISTORY & PHYSICAL   Ms. Kram is a 70 year old female who I asked to return to the hospital  today because of recurrent melena and hematochezia.  She was admitted to the  hospital on March 4 and underwent an upper endoscopy that showed two  duodenal ulcers, one of which was at least 1 cm in size and had a dark spot  within it.  This was at the distal bulb area and very hard to visualize  because of the angulation of the duodenum.  She was in the hospital,  however, from the 4th through the 7th with a very stable and spontaneously  increasing hemoglobin and her discharge hemoglobin was 10.1.  However, the  day following surgery she called with reported melena and today she had  melena and hematochezia times two.  She has a prior history of gastric and  duodenal ulcers and also chronic relapsing idiopathic pancreatitis, treated  with a pylorus-sparing Whipple operation.  She had been treated with IV  Protonix drip when hospitalized and went home on Prevacid twice daily.  Her  epigastric abdominal pain has basically disappeared at present.  For the  remainder of her history, please see the recently dictated history and  discharge summary within the past week.   PHYSICAL EXAMINATION:  GENERAL:  She is a well-developed, well-nourished  adult female in no acute distress.  VITAL SIGNS:  Afebrile; blood pressure 103/61; pulse 66 and regular.  SKIN:  Normal.  EYES:  Anicteric.  OROPHARYNX:  Unremarkable.  NECK:  Supple without thyromegaly.  CHEST:  Clear.  HEART SOUNDS:  Regular rate and rhythm.  ABDOMEN:  Soft with  active bowel sounds.  The epigastric tenderness  previously noted is not present today.  RECTAL:  Maroon stool.  EXTREMITIES:  Without cyanosis, clubbing, edema or rash.   Hemoglobin is 10.2, which is actually higher than at discharge.  The patient  was taken immediately for upper endoscopy which revealed that the smaller  duodenal ulcer apparently had completely healed.  The large duodenal ulcer  appeared to be healing.  There was no active bleeding.  The dark spot was  less evident and there was no blood throughout the entire GI tract.  For  this reason, I suspect that she may be having lower GI bleeding as well.   IMPRESSION:  Rule out lower gastrointestinal bleed.   PLAN:  The patient is admitted for hydration, serial hemoglobins, IV  Protonix and colonoscopy in the morning.  Please see the orders.  Althea Grimmer. Luther Parody, M.D.    PJS/MEDQ  D:  11/03/2003  T:  11/04/2003  Job:  [id number]   cc:   Meredith Staggers, M.D.  [address], [address]  [place]  [place] [address]  Fax: [phone]   Everardo All. Madilyn Fireman, M.D.  [address], [address]  [place]  [place] [address]  Fax: [phone]

## 2011-01-12 NOTE — Discharge Summary (Signed)
NAME:  Deborah Travis, Deborah Travis                          ACCOUNT NO.:  [id number]   MEDICAL RECORD NO.:  [id number]                   PATIENT TYPE:  INP   LOCATION:  5502                                 FACILITY:  [facility]   PHYSICIAN:  Althea Grimmer. Luther Parody, M.D.            DATE OF BIRTH:  1941-01-11   DATE OF ADMISSION:  10/29/2003  DATE OF DISCHARGE:  11/01/2003                                 DISCHARGE SUMMARY   DISCHARGE DIAGNOSES:  1. Recurrent duodenal ulcer disease.  2. History of chronic idiopathic relapsing pancreatitis status post pylorus     sparing Whipple surgery.  3. Status post remote cholecystectomy.  4. Hypertension (not evident on this admission).  5. Hypothyroidism on supplementation.   HISTORY OF PRESENT ILLNESS:  Deborah Travis is a 70 year old female sent to the  emergency room with melena and hematochezia associated with epigastric pain.  She has had gastric and duodenal ulcers in the past and was last seen in our  practice for these in 1999. Tests for Helicobacter at that time were  negative. She also had a history of chronic relapsing idiopathic  pancreatitis treated here and at [facility] with a pylorus sparing Whipple  operation done by Dr. Gerrit Friends. She did report that as an outpatient she was  told that her pancreatic enzymes were elevated, but I have no documentation  of this. She has had no vomiting. She reports not taking any nonsteroidals,  though she has taken Excedrin PM every few nights, occasionally two at a  time. For the remainder of the history please see the recent dictated  admission note.   PHYSICAL EXAMINATION:  VITAL SIGNS:  She was afebrile. Blood pressure  115/80, pulse 90 and  regular.  HEENT:  Eyes were anicteric.  CHEST:  Clear.  HEART:  Regular rate and rhythm.  ABDOMEN:  Tender in the epigastrium. There was a long, well-healed  horizontal upper abdominal scar.   LABORATORY DATA:  Hemoglobin 11, white blood count 11.4.  Amylase, lipase,  and liver  function tests normal. Upper endoscopy was performed the day of  admission and revealed two duodenal ulcers; one of which had stigmata of  recent bleeding, but was not actively hemorrhaging. CLOtest was performed  and was negative. The patient was treated with Protonix drip. With hydration  her hemoglobin dropped to 8.5 and she was transfused one unit of blood.  Gradually her pain lessened and she was switched to oral medications. She  had no further overt bleeding.   On discharge her hemoglobin was 10.1, her lipase 18. Prothrombin time was  normal, INR 1. Serum gastrin pending.   DISCHARGE MEDICATIONS:  1. Levoxyl 75 mcg daily.  2. Hydrochlorothiazide was on hold since the patient's blood pressure was     borderline throughout her hospitalization.  3. Vicodin 5/500 mg one every six hours p.r.n. pain.  4. Aciphex 20 mg b.i.d.   CONDITION ON DISCHARGE:  Improved  and stable.   DISCHARGE FOLLOW UP:  Follow-up to see Dr. Madilyn Fireman in 10-14 days in the office  or call sooner if p.r.n. problems.                                                Althea Grimmer. Luther Parody, M.D.    PJS/MEDQ  D:  11/01/2003  T:  11/02/2003  Job:  [id number]   cc:   Everardo All. Madilyn Fireman, M.D.  [address]  [place]  [place] [address]  Fax: [phone]   Meredith Staggers, M.D.  [address]  [place]  [place] [address]  Fax: [phone]

## 2011-01-12 NOTE — H&P (Signed)
NAME:  Deborah Travis, Deborah Travis                          ACCOUNT NO.:  [id number]   MEDICAL RECORD NO.:  [id number]                   PATIENT TYPE:  INP   LOCATION:  5502                                 FACILITY:  [facility]   PHYSICIAN:  Althea Grimmer. Luther Parody, M.D.            DATE OF BIRTH:  1941/05/28   DATE OF ADMISSION:  10/29/2003  DATE OF DISCHARGE:                                HISTORY & PHYSICAL   HISTORY OF PRESENT ILLNESS:  Deborah Travis is a 70 year old female who is sent  to the emergency room because of melanoa and hematochezia associated with  epigastric pain.  She has an interesting past history in that she has had  multiple gastric and duodenal ulcers and was last seen in our practice for  these in 1999.  At that time, CLOtest was negative.  She also was suffering  from chronic relapsing pancreatitis and as evaluated both here and at [facility]  for this problem.  There is a question as to whether her ulcer disease at  that time was secondary to aspirin or nonsteroidals.  She eventually in 1999  underwent a pylorus sparing Whipple operation and has done quite well until  recently.  She reports to me that recently her pancreatic enzymes were  elevated but I have no documentation or known further evaluation of this.   This morning, she passed both melena and red blood in the stool and said  that her epigastric discomfort was worse.  It was burning and constant  bordering ache.  She has not vomited.  She is not taking any medications for  ulcers at this time.  She does not take nonsteroidals, though she does take  an Excedrin PM every few nights and occasionally two at a time.  She is not  losing weight.   PAST MEDICAL HISTORY:  1. History of ulcer disease.  2. History of chronic relapsing pancreatitis, status post pylorus sparing     Whipple disease.  3. Status post remote cholecystectomy.  4. Hypertension.  5. Hypothyroidism.   CURRENT MEDICATIONS:  1. Levoxyl 75 mcg q.d.  2.  Hydrochlorothiazide 25 mg q.d.   ALLERGIES:  None reported.   FAMILY HISTORY:  Negative for known colorectal neoplasia, ulcers,  pancreatitis, or biliary disease.   SOCIAL HISTORY:  Nonsmoker and nondrinker.   REVIEW OF SYMPTOMS:  GENERAL:  No weight loss or night sweats.  ENDOCRINE:  History of hypothyroidism on supplementation.  SKIN:  No rash or pruritus.  EYES:  No icterus or change in vision.  ENT:  No aphthous ulcers or chronic  sore throat.  RESPIRATORY:  No shortness of breath, cough, wheezing.  CARDIAC:  No chest pain, palpitations, or history of valvular heart disease.  GASTROINTESTINAL:  As above.  GENITOURINARY:  No dysuria or hematuria.  The  remainder of the review of systems is negative.   PHYSICAL EXAMINATION:  GENERAL:  She is a well-developed, well-nourished  adult female in no acute distress.  VITAL SIGNS:  Afebrile.  Blood pressure 115/80, pulse is 90 and regular.  SKIN:  Normal.  HEENT:  Eyes:  Anicteric.  Oropharynx:  Unremarkable.  NECK:  Supple without thyromegaly.  There is no cervical or inguinal  adenopathy.  CHEST:  Clear.  HEART:  Regular rate and rhythm.  ABDOMEN:  Soft with epigastric tenderness to deep palpation.  There is a  long well-healed horizontal upper abdominal scar.  RECTAL:  Not performed.  EXTREMITIES:  Without clubbing, cyanosis, edema, or rash.   LABORATORY DATA:  Hemoglobin of 11, platelet count 327,000, white blood  count 11.4.  Prothrombin time and INR are normal.  Electrolytes are normal.  BUN 19, creatinine 1.  Liver function tests normal.  Amylase and lipase are  normal.   A CT scan from October 26, 2003 demonstrates that the patient is status post  cholecystectomy and Whipple procedure.  There is normal pneumobilia  consistent with her prior surgery.  There are pancreatic tail  calcifications.  She is status post appendectomy.  No other pathologic  findings are noted.   IMPRESSION:  A 70 year old female who occasionally uses  Excedrin.  She has a  history of bleeding from ulcer disease and sounds like she has a recurrent  ulcer.   PLAN:  Upper endoscopy will be performed as soon as possible.  She is  started on Protonix.  Further recommendations will follow this procedure.  I  do not believe that her pain is due to pancreatitis at this time.  Upper  endoscopy did indeed reveal two nonbleeding duodenal ulcers.  This procedure  note is dictated separately.                                                Althea Grimmer. Luther Parody, M.D.    PJS/MEDQ  D:  10/29/2003  T:  10/30/2003  Job:  [id number]   cc:   Meredith Staggers, M.D.  [address]  [place]  [place] [address]  Fax: [phone]

## 2011-01-12 NOTE — Telephone Encounter (Signed)
Called pt told her of lab results and she came by to pick up a copy of labs

## 2011-03-01 LAB — HM COLONOSCOPY

## 2011-03-23 ENCOUNTER — Telehealth: Payer: Self-pay

## 2011-03-23 NOTE — Telephone Encounter (Signed)
Called pt to let her know that Rosaura Carpenter has decided to let the GI doctor make that decision

## 2011-03-23 NOTE — Telephone Encounter (Signed)
Pt called said right side still hurting for 3 days problem with B/M not able to go said she saw Dr.Hayes and goes back Aug.13 she would like to know if a colonoscopy is needed wants your advise

## 2011-03-23 NOTE — Telephone Encounter (Signed)
Tell her that I would defer that decision to her GI doctor

## 2011-03-28 ENCOUNTER — Other Ambulatory Visit: Payer: Self-pay | Admitting: Gastroenterology

## 2011-03-28 DIAGNOSIS — R634 Abnormal weight loss: Secondary | ICD-10-CM

## 2011-03-28 DIAGNOSIS — R1011 Right upper quadrant pain: Secondary | ICD-10-CM

## 2011-03-30 ENCOUNTER — Other Ambulatory Visit: Payer: Self-pay | Admitting: Gastroenterology

## 2011-03-30 ENCOUNTER — Ambulatory Visit
Admission: RE | Admit: 2011-03-30 | Discharge: 2011-03-30 | Disposition: A | Discharge status: Home / Self Care | Payer: Medicare Other | Source: Ambulatory Visit | Attending: Gastroenterology | Admitting: Gastroenterology

## 2011-03-30 DIAGNOSIS — R634 Abnormal weight loss: Secondary | ICD-10-CM

## 2011-03-30 DIAGNOSIS — R1011 Right upper quadrant pain: Secondary | ICD-10-CM

## 2011-03-30 MED ORDER — IOHEXOL 300 MG/ML  SOLN
100.0000 mL | Freq: Once | INTRAMUSCULAR | Status: AC | PRN
  Administered 2011-03-30: 100 mL via INTRAVENOUS

## 2011-04-09 ENCOUNTER — Other Ambulatory Visit: Payer: Self-pay | Admitting: Gastroenterology

## 2011-04-09 DIAGNOSIS — R1011 Right upper quadrant pain: Secondary | ICD-10-CM

## 2011-04-10 ENCOUNTER — Ambulatory Visit
Admission: RE | Admit: 2011-04-10 | Discharge: 2011-04-10 | Disposition: A | Discharge status: Home / Self Care | Payer: Medicare Other | Source: Ambulatory Visit | Attending: Gastroenterology | Admitting: Gastroenterology

## 2011-04-10 DIAGNOSIS — R1011 Right upper quadrant pain: Secondary | ICD-10-CM

## 2011-04-17 ENCOUNTER — Encounter: Payer: Self-pay | Admitting: Family Medicine

## 2011-04-19 ENCOUNTER — Ambulatory Visit: Payer: MEDICARE | Admitting: Family Medicine

## 2011-05-12 ENCOUNTER — Encounter (HOSPITAL_BASED_OUTPATIENT_CLINIC_OR_DEPARTMENT_OTHER): Payer: Self-pay | Admitting: *Deleted

## 2011-05-12 ENCOUNTER — Emergency Department (HOSPITAL_BASED_OUTPATIENT_CLINIC_OR_DEPARTMENT_OTHER)
Admission: EM | Admit: 2011-05-12 | Discharge: 2011-05-13 | Disposition: A | Discharge status: Home / Self Care | Payer: Medicare Other | Attending: Emergency Medicine | Admitting: Emergency Medicine

## 2011-05-12 DIAGNOSIS — E119 Type 2 diabetes mellitus without complications: Secondary | ICD-10-CM | POA: Insufficient documentation

## 2011-05-12 DIAGNOSIS — N189 Chronic kidney disease, unspecified: Secondary | ICD-10-CM | POA: Insufficient documentation

## 2011-05-12 DIAGNOSIS — H8110 Benign paroxysmal vertigo, unspecified ear: Secondary | ICD-10-CM

## 2011-05-12 DIAGNOSIS — I129 Hypertensive chronic kidney disease with stage 1 through stage 4 chronic kidney disease, or unspecified chronic kidney disease: Secondary | ICD-10-CM | POA: Insufficient documentation

## 2011-05-12 DIAGNOSIS — R110 Nausea: Secondary | ICD-10-CM | POA: Insufficient documentation

## 2011-05-12 MED ORDER — ONDANSETRON HCL 4 MG/2ML IJ SOLN
INTRAMUSCULAR | Status: AC
  Administered 2011-05-13
  Filled 2011-05-12: qty 2

## 2011-05-12 NOTE — ED Notes (Signed)
EMS reports N/V and dizziness possible vertigo

## 2011-05-13 ENCOUNTER — Encounter (HOSPITAL_BASED_OUTPATIENT_CLINIC_OR_DEPARTMENT_OTHER): Payer: Self-pay

## 2011-05-13 MED ORDER — LORAZEPAM 2 MG/ML IJ SOLN
1.0000 mg | Freq: Once | INTRAMUSCULAR | Status: AC
  Administered 2011-05-13: 1 mg via INTRAVENOUS
  Filled 2011-05-13: qty 1

## 2011-05-13 MED ORDER — MECLIZINE HCL 25 MG PO TABS: 25.0000 mg | ORAL_TABLET | Freq: Four times a day (QID) | ORAL | Status: AC

## 2011-05-13 MED ORDER — MECLIZINE HCL 25 MG PO TABS
25.0000 mg | ORAL_TABLET | Freq: Once | ORAL | Status: AC
  Administered 2011-05-13: 25 mg via ORAL
  Filled 2011-05-13: qty 1

## 2011-05-13 MED ORDER — SODIUM CHLORIDE 0.9 % IV SOLN
INTRAVENOUS | Status: DC
  Administered 2011-05-13: 01:00:00 via INTRAVENOUS

## 2011-05-13 NOTE — ED Notes (Signed)
Pt has woke up, ambulated to restroom without difficulty and returned to stretcher.  Pt was able to contact a friend to come to [facility] and pick her up.  Discharge pending.

## 2011-05-13 NOTE — ED Notes (Signed)
Pt remains very drowsy and confused.  Dr Freida Busman is aware.

## 2011-05-13 NOTE — ED Notes (Signed)
Pt states that she believes that she has vertigo.  Pt presents c/o dizziness when standing up, nausea, and vomiting.  Pt states that in the last 24 hours she has vomited approx 2 times.  Pt denies dysuria, hematuria, diarrhea, constipation, sinus pain/pressure, congestion, sneezing, sob.

## 2011-05-13 NOTE — ED Provider Notes (Signed)
History     CSN: [id number] Arrival date & time: 05/12/2011 11:48 PM   Chief Complaint  Patient presents with  . Dizziness  . Nausea     (Include location/radiation/quality/duration/timing/severity/associated sxs/prior treatment) The history is provided by the patient.  pt has h/o vertigo, similar sx started today--ems gave pt zofran for n/v--sx worse with movement of head, denies headache, neck pain, focal weakness--sx better with eyes closed--no ear pain or recent uri sx   Past Medical History  Diagnosis Date  . Hypertension   . Diabetes mellitus   . Thyroid disease     HYPOTHYROID  . GERD (gastroesophageal reflux disease)   . Osteoporosis   . Vitamin D deficiency   . Dyslipidemia   . Chronic kidney disease     RENAL STONE     Past Surgical History  Procedure Date  . Cholecystectomy   . Whipple procedure partial    History reviewed. No pertinent family history.  History  Substance Use Topics  . Smoking status: Current Everyday Smoker -- 0.0 packs/day  . Smokeless tobacco: Never Used  . Alcohol Use: Not on file    OB History    Grav Para Term Preterm Abortions TAB SAB Ect Mult Living                  Review of Systems  All other systems reviewed and are negative.    Allergies  Codeine and Morphine and related  Home Medications   Current Outpatient Rx  Name Route Sig Dispense Refill  . ONDANSETRON HCL 4 MG/2ML IJ SOLN Intravenous Inject 4 mg into the vein once.      Marland Kitchen CALCIUM CARBONATE 1250 MG PO CAPS Oral Take 1,250 mg by mouth 2 (two) times daily with a meal.      . INSULIN ASPART PROT & ASPART (70-30) 100 UNIT/ML [place] SUSP Subcutaneous Inject 10 Units into the skin 2 (two) times daily with a meal.      . LANSOPRAZOLE 15 MG PO CPDR Oral Take 15 mg by mouth daily.      Marland Kitchen LEVOTHYROXINE SODIUM 100 MCG PO TABS Oral Take 100 mcg by mouth daily.      Marland Kitchen LISINOPRIL 10 MG PO TABS Oral Take 10 mg by mouth 1 dose over 24 hours.     Marland Kitchen METFORMIN HCL 1000 MG  (MOD) PO TB24 Oral Take 1,000 mg by mouth 2 (two) times daily with a meal.      . PRAVASTATIN SODIUM 40 MG PO TABS Oral Take 40 mg by mouth daily.        Physical Exam    BP 128/67  Pulse 67  Temp(Src) 98 F (36.7 C) (Oral)  Resp 18  SpO2 98%  Physical Exam  Nursing note and vitals reviewed. Constitutional: She is oriented to person, place, and time. Vital signs are normal. She appears well-developed and well-nourished.  Non-toxic appearance. No distress.  HENT:  Head: Normocephalic and atraumatic.  Eyes: Conjunctivae and EOM are normal. Pupils are equal, round, and reactive to light.  Neck: Normal range of motion. Neck supple. No tracheal deviation present.  Cardiovascular: Normal rate, regular rhythm and normal heart sounds.  Exam reveals no gallop.   No murmur heard. Pulmonary/Chest: Effort normal and breath sounds normal. No stridor. No respiratory distress. She has no wheezes.  Abdominal: Soft. Normal appearance and bowel sounds are normal. She exhibits no distension. There is no tenderness. There is no rebound.  Musculoskeletal: Normal range of motion. She exhibits  no edema and no tenderness.  Neurological: She is alert and oriented to person, place, and time. She has normal strength. No sensory deficit. Coordination normal. GCS eye subscore is 4. GCS verbal subscore is 5. GCS motor subscore is 6.       Nystagmus noted  Skin: Skin is warm and dry.  Psychiatric: She has a normal mood and affect. Her speech is normal and behavior is normal.    ED Course  Procedures  Results for orders placed in visit on 04/17/11  HM DEXA SCAN      Component Value Range   HM Dexa Scan OSTEOPENIA     No results found.   No diagnosis found.   MDM 3:30 AM Pt rechecked after getting ativan and antivert, sleepy, dizziness improved, will continue to monitor     6:28 AM Patient rechecked and now able to walk in the department unassisted will be discharged to home with a relative will  be given a prescription for Antivert  Toy Baker, MD 05/13/11 [phone]

## 2011-05-14 ENCOUNTER — Encounter (INDEPENDENT_AMBULATORY_CARE_PROVIDER_SITE_OTHER): Payer: Self-pay | Admitting: Surgery

## 2011-05-16 ENCOUNTER — Encounter (INDEPENDENT_AMBULATORY_CARE_PROVIDER_SITE_OTHER): Payer: Self-pay | Admitting: Surgery

## 2011-05-16 ENCOUNTER — Ambulatory Visit (INDEPENDENT_AMBULATORY_CARE_PROVIDER_SITE_OTHER): Payer: Medicare Other | Admitting: Surgery

## 2011-05-16 DIAGNOSIS — R109 Unspecified abdominal pain: Secondary | ICD-10-CM | POA: Insufficient documentation

## 2011-05-16 NOTE — Patient Instructions (Signed)
Please call if pain recurs.  TMG

## 2011-05-16 NOTE — Progress Notes (Signed)
Chief Complaint  Patient presents with  . New Evaluation    abdominal pain  for [facility]    HISTORY: Patient is a 70 year old female who presents at the request of her gastroenterologist for evaluation of right flank and right upper quadrant abdominal pain. Patient is known to my surgical practice. She had undergone cholecystectomy and then a pylorus sparing Whipple procedure in the late 1990s. This was for chronic pancreatitis.  Over the past 6 months the patient has noted intermittent pain in the right upper cautery of the abdominal wall. She denies nausea or vomiting. She denies fevers or chills. She has had no change in her bowel habits. Pain occurs randomly and does not appear to be related to food intake or physical activity. She denies any sign of hernia. Pain does occur at night on occasion. Pain may be related to unusual levels of stress relating to the death of her son recently.  Patient has undergone an extensive workup by her gastroenterologist including laboratory studies, upper endoscopy, and CT scan of the abdomen and pelvis. Essentially all of these studies were normal.   Past Medical History  Diagnosis Date  . Hypertension   . Diabetes mellitus   . GERD (gastroesophageal reflux disease)   . Osteoporosis   . Vitamin D deficiency   . Dyslipidemia   . Chronic kidney disease     RENAL STONE  . Duodenal ulcer   . Thyroid disease     HYPOTHYROID  . Chronic pancreatitis      Current Outpatient Prescriptions  Medication Sig Dispense Refill  . calcium carbonate 1250 MG capsule Take 1,250 mg by mouth 2 (two) times daily with a meal.        . esomeprazole (NEXIUM) 40 MG capsule Take 40 mg by mouth 2 (two) times daily.        . insulin aspart protamine-insulin aspart (NOVOLOG 70/30) (70-30) 100 UNIT/ML injection Inject 10 Units into the skin 2 (two) times daily with a meal.        . levothyroxine (SYNTHROID, LEVOTHROID) 100 MCG tablet Take 100 mcg by mouth daily.        Marland Kitchen  lisinopril (PRINIVIL,ZESTRIL) 10 MG tablet Take 10 mg by mouth 1 dose over 24 hours.       . meclizine (ANTIVERT) 25 MG tablet Take 1 tablet (25 mg total) by mouth 4 (four) times daily.  28 tablet  0  . metFORMIN (GLUMETZA) 1000 MG (MOD) 24 hr tablet Take 1,000 mg by mouth 2 (two) times daily with a meal.        . pravastatin (PRAVACHOL) 40 MG tablet Take 40 mg by mouth daily.        . lansoprazole (PREVACID) 15 MG capsule Take 15 mg by mouth daily.        . ondansetron (ZOFRAN) 4 MG/2ML SOLN Inject 4 mg into the vein once.        . Pancrelipase, Lip-Prot-Amyl, (CREON) 24000 UNITS CPEP Take by mouth. 3 capsules before each meal          Allergies  Allergen Reactions  . Codeine Itching  . Morphine And Related Itching     No family history on file.   History   Social History  . Marital Status: Single    Spouse Name: N/A    Number of Children: N/A  . Years of Education: N/A   Social History Main Topics  . Smoking status: Current Everyday Smoker -- 0.0 packs/day  . Smokeless tobacco:  Never Used   Comment: 3 cigarettes a day  . Alcohol Use: No  . Drug Use: No  . Sexually Active: None   Other Topics Concern  . None   Social History Narrative  . None     REVIEW OF SYSTEMS - PERTINENT POSITIVES ONLY: Patient has noted a small sharp foreign body beneath the skin in the right flank. This is near the site of her pain. Otherwise review of systems is essentially negative.   EXAM: Filed Vitals:   05/16/11 0852  BP: 108/70  Pulse: 100  Temp: 97.4 F (36.3 C)  Resp: 16    HEENT: normocephalic; pupils equal and reactive; sclerae clear; dentition good; mucous membranes moist NECK:  ; symmetric on extension; no palpable anterior or posterior cervical lymphadenopathy; no supraclavicular masses; no tenderness CHEST: clear to auscultation bilaterally without rales, rhonchi, or wheezes CARDIAC: regular rate and rhythm without significant murmur; peripheral pulses are  full ABDOMEN: Abdomen is examined both in a recumbent and a standing position. There is an extended right subcostal incision which is well healed. There is no sign of herniation. There is no hepatosplenomegaly. There is palpable suture material at the right lateral extent of the incision consistent with the previously placed Novafil suture. This is mildly tender to palpation. Remainder of the abdomen is without palpable mass. Bowel sounds are present. EXT:  non-tender without edema; no deformity NEURO: no gross focal deficits; no sign of tremor   LABORATORY RESULTS: See E-Chart for most recent results   RADIOLOGY RESULTS: See E-Chart or I-Site for most recent results   IMPRESSION: #1 status post Whipple procedure with sparing of the pylorus for chronic pancreatitis #2 status post cholecystectomy #3 right flank and right upper quadrant abdominal pain, intermittent, of undetermined etiology #4 foreign body right flank consistent with suture material   PLAN: At this point the patient has been pain free for the past 2 weeks. She does not wish any intervention at this time. We discussed the possibility of diagnostic laparoscopy but I am not in favor of that procedure. We discussed removing the foreign body from the right most extent of her incision under local anesthesia and sedation as an outpatient procedure. Patient is interested in this procedure if her pain recurs. I have asked her to contact us if she begins having pain again. If the pain appears to be centered around the area of the suture material, it would be a simple matter to remove that as an outpatient procedure.  Patient will contact us if symptoms recur.   Velora Heckler, MD, FACS General & Endocrine Surgery [facility]      Visit Diagnoses: 1. Abdominal pain     Primary Care Physician: Carollee Herter, MD, MD

## 2011-05-21 ENCOUNTER — Ambulatory Visit (INDEPENDENT_AMBULATORY_CARE_PROVIDER_SITE_OTHER): Payer: Medicare Other | Admitting: Family Medicine

## 2011-05-21 ENCOUNTER — Encounter: Payer: Self-pay | Admitting: Family Medicine

## 2011-05-21 DIAGNOSIS — Z634 Disappearance and death of family member: Secondary | ICD-10-CM

## 2011-05-21 DIAGNOSIS — G479 Sleep disorder, unspecified: Secondary | ICD-10-CM

## 2011-05-21 DIAGNOSIS — Z23 Encounter for immunization: Secondary | ICD-10-CM

## 2011-05-21 DIAGNOSIS — K8681 Exocrine pancreatic insufficiency: Secondary | ICD-10-CM | POA: Insufficient documentation

## 2011-05-21 DIAGNOSIS — I152 Hypertension secondary to endocrine disorders: Secondary | ICD-10-CM | POA: Insufficient documentation

## 2011-05-21 DIAGNOSIS — E118 Type 2 diabetes mellitus with unspecified complications: Secondary | ICD-10-CM | POA: Insufficient documentation

## 2011-05-21 DIAGNOSIS — E785 Hyperlipidemia, unspecified: Secondary | ICD-10-CM

## 2011-05-21 DIAGNOSIS — E1159 Type 2 diabetes mellitus with other circulatory complications: Secondary | ICD-10-CM

## 2011-05-21 DIAGNOSIS — E1169 Type 2 diabetes mellitus with other specified complication: Secondary | ICD-10-CM

## 2011-05-21 DIAGNOSIS — E119 Type 2 diabetes mellitus without complications: Secondary | ICD-10-CM

## 2011-05-21 DIAGNOSIS — K219 Gastro-esophageal reflux disease without esophagitis: Secondary | ICD-10-CM

## 2011-05-21 DIAGNOSIS — I10 Essential (primary) hypertension: Secondary | ICD-10-CM

## 2011-05-21 LAB — POCT GLYCOSYLATED HEMOGLOBIN (HGB A1C): Hemoglobin A1C: 6.1

## 2011-05-21 MED ORDER — ZOLPIDEM TARTRATE 5 MG PO TABS: 5.0000 mg | ORAL_TABLET | Freq: Every evening | ORAL | Status: DC | PRN

## 2011-05-21 NOTE — Progress Notes (Signed)
  Subjective:    Patient ID: Deborah Travis, female    DOB: [birth year], 70 y.o.   MRN: [id number]  HPI She was seen recently in the emergency room and treated for dizziness with Antivert. She is doing much better today. She continues on NovoLog and metformin. Her blood sugars are in the low 100s. The last 6 months and been very stressful for her dealing with her grandson who recently committed suicide. Also of note is after that occurred the abdominal pain she was having has gone away. She has lost some weight but claims is on recent stress She does plan to get a DEXA scan done as well as eye exam but has postponed because of the above. Her reflux is under good control. She continues on medicines listed in the chart. Smoking and drinking were reviewed.   Review of Systems Negative except as above    Objective:   Physical Exam Alert and in no distress otherwise not examined. Hemoglobin A1c 6.1       Assessment & Plan:   1. Diabetes mellitus  POCT HgB A1C  2. Bereavement    3. Hypertension associated with diabetes    4. GERD (gastroesophageal reflux disease)    5. Hyperlipidemia LDL goal < 70    6. Sleep disturbance     strongly encouraged her to get involved with bereavement counseling through hospice. Continue on present medications. I will give her a small dose of Ambien with instructions on use. Continue on present medications.

## 2011-05-24 LAB — POCT I-STAT, CHEM 8
Glucose, Bld: 107 — ABNORMAL HIGH
HCT: 48 — ABNORMAL HIGH
Hemoglobin: 16.3 — ABNORMAL HIGH
Potassium: 3.7
Sodium: 144

## 2011-05-25 LAB — BASIC METABOLIC PANEL
BUN: 13
GFR calc Af Amer: >60
GFR calc non Af Amer: 53 — ABNORMAL LOW
Potassium: 5.8 — ABNORMAL HIGH
Sodium: 142

## 2011-05-25 LAB — POCT I-STAT 4, (NA,K, GLUC, HGB,HCT)
Glucose, Bld: 113 — ABNORMAL HIGH
HCT: 43

## 2011-05-25 LAB — GLUCOSE, CAPILLARY: Glucose-Capillary: 101 — ABNORMAL HIGH

## 2011-05-25 LAB — HEMOGLOBIN AND HEMATOCRIT, BLOOD: Hemoglobin: 13.3

## 2011-06-26 ENCOUNTER — Telehealth: Payer: Self-pay | Admitting: Family Medicine

## 2011-06-26 ENCOUNTER — Other Ambulatory Visit: Payer: Self-pay | Admitting: Internal Medicine

## 2011-06-26 MED ORDER — PRAVASTATIN SODIUM 40 MG PO TABS: 40.0000 mg | ORAL_TABLET | Freq: Every day | ORAL | Status: DC

## 2011-06-26 MED ORDER — LEVOTHYROXINE SODIUM 100 MCG PO TABS: 100.0000 ug | ORAL_TABLET | Freq: Every day | ORAL | Status: DC

## 2011-06-26 MED ORDER — METFORMIN HCL ER (MOD) 1000 MG PO TB24: 1000.0000 mg | ORAL_TABLET | Freq: Two times a day (BID) | ORAL | Status: DC

## 2011-06-26 MED ORDER — LISINOPRIL 10 MG PO TABS: 10.0000 mg | ORAL_TABLET | ORAL | Status: DC

## 2011-06-26 NOTE — Telephone Encounter (Signed)
Renewed her med and sent to wrong place. Canceled her order at [facility] and renewed all 4 meds at [facility]

## 2011-06-26 NOTE — Telephone Encounter (Signed)
Is it okay to refill all 4 of her meds. She has an appt set up for 09/20/11.

## 2011-06-26 NOTE — Telephone Encounter (Signed)
Go head and renew her medications

## 2011-06-27 ENCOUNTER — Telehealth: Payer: Self-pay | Admitting: Family Medicine

## 2011-06-27 NOTE — Telephone Encounter (Signed)
FAX REQ RECV'D GLUMETZA 1000 MG #180 REQUESTING THAT PT BE CHANGED TO METFORMIN BECAUSE DRUG COST $1000.00

## 2011-06-28 ENCOUNTER — Telehealth: Payer: Self-pay | Admitting: Family Medicine

## 2011-06-28 MED ORDER — METFORMIN HCL 1000 MG PO TABS: 1000.0000 mg | ORAL_TABLET | Freq: Two times a day (BID) | ORAL | Status: DC

## 2011-06-28 NOTE — Telephone Encounter (Signed)
Renew her Ambien

## 2011-06-28 NOTE — Telephone Encounter (Signed)
Deborah Travis called in at [facility] [address]. [phone]

## 2011-06-28 NOTE — Telephone Encounter (Signed)
Patient was changed to generic metformin

## 2011-07-17 ENCOUNTER — Telehealth: Payer: Self-pay | Admitting: Family Medicine

## 2011-07-17 NOTE — Telephone Encounter (Signed)
I let pt know that you does not really want to give out an antibotic for something you have not seen yet and that you would like her to come in but pt said she doesn't have a way to get here cause she doesn't have a car.

## 2011-07-17 NOTE — Telephone Encounter (Signed)
She needs an appointment.

## 2011-07-18 ENCOUNTER — Ambulatory Visit: Payer: Medicare Other | Admitting: Medical

## 2011-08-01 ENCOUNTER — Ambulatory Visit
Admission: RE | Admit: 2011-08-01 | Discharge: 2011-08-01 | Disposition: A | Discharge status: Home / Self Care | Payer: Medicare Other | Source: Ambulatory Visit | Attending: Medical | Admitting: Medical

## 2011-08-01 ENCOUNTER — Ambulatory Visit (INDEPENDENT_AMBULATORY_CARE_PROVIDER_SITE_OTHER): Payer: Medicare Other | Admitting: Medical

## 2011-08-01 ENCOUNTER — Encounter: Payer: Self-pay | Admitting: Medical

## 2011-08-01 VITALS — BP 100/70 | HR 68 | Temp 98.0°F | Resp 14 | Wt 108.0 lb

## 2011-08-01 DIAGNOSIS — R0781 Pleurodynia: Secondary | ICD-10-CM | POA: Insufficient documentation

## 2011-08-01 DIAGNOSIS — M79675 Pain in left toe(s): Secondary | ICD-10-CM | POA: Insufficient documentation

## 2011-08-01 DIAGNOSIS — R079 Chest pain, unspecified: Secondary | ICD-10-CM

## 2011-08-01 DIAGNOSIS — M79609 Pain in unspecified limb: Secondary | ICD-10-CM

## 2011-08-01 DIAGNOSIS — M549 Dorsalgia, unspecified: Secondary | ICD-10-CM

## 2011-08-01 DIAGNOSIS — W108XXA Fall (on) (from) other stairs and steps, initial encounter: Secondary | ICD-10-CM | POA: Insufficient documentation

## 2011-08-01 MED ORDER — TRAMADOL HCL 50 MG PO TABS
ORAL_TABLET | ORAL | Status: DC
Start: 2011-08-01 — End: 2011-08-06

## 2011-08-01 NOTE — Progress Notes (Signed)
Subjective:   HPI  Deborah Travis is a 70 y.o. female who presents with injury.  She notes that she fell down 2-3 steps as she was going down stairs in her daughter's house last evening. She says she mis stepped, slid down and fell against the stairs.  Denies head injury or LOC.  She landed on her right side, back hit the stairs.  She is complaining of back pain in right chest wall and low back, pain in left foot.  Hurts in right anterior ribs.  Denies any breathing problems.  Using some Tylenol for symptom.  She hit her left great toe on something and this hurts too. She notes no prior fractures, but has had bilat bunion surgery, and she has hx/o osteopenia.  She has fallen one other time in remote past, but both times were slipping on a surface type injury.  No other aggravating or relieving factors.    The following portions of the patient's history were reviewed and updated as appropriate: allergies, current medications, past family history, past medical history, past social history, past surgical history and problem list.  Past Medical History  Diagnosis Date  . Hypertension   . Diabetes mellitus   . GERD (gastroesophageal reflux disease)   . Osteoporosis   . Vitamin D deficiency   . Dyslipidemia   . Chronic kidney disease     RENAL STONE  . Duodenal ulcer   . Thyroid disease     HYPOTHYROID  . Chronic pancreatitis    Review of Systems Constitutional: -fever, -chills, -sweats, -unexpected -weight change,+fatigue ENT: -runny nose, -ear pain, -sore throat Cardiology:  -chest pain, -palpitations, -edema Respiratory: -cough, -shortness of breath, -wheezing Gastroenterology: -abdominal pain, -nausea, -vomiting, -diarrhea, -constipation Hematology: -bleeding or bruising problems Musculoskeletal: -arthralgias, -myalgias, -joint swelling, +back pain Ophthalmology: -vision changes Urology: -dysuria, -difficulty urinating, -hematuria, -urinary frequency, -urgency Neurology: -headache,  -weakness, -tingling, -numbness   Objective:   Physical Exam  Filed Vitals:   08/01/11 0932  BP: 100/70  Pulse: 68  Temp: 98 F (36.7 C)  Resp: 14    General appearance: alert, no distress, WD/WN, thin white female Skin: right posterior lower ribs with few areas of purplish ecchymosis Neck: supple, no lymphadenopathy, no thyromegaly, no masses Heart: RRR, normal S1, S2, no murmurs Lungs: CTA bilaterally, no wheezes, rhonchi, or rales Abdomen: +bs, soft, non tender, non distended Back and chest wall, quite tender over right lateral and posterior chest wall, tender to lesser extent over right lumbar paraspinal region, and tender over posterior pelvis at superior aspect of the medial pelvis, but no midline back tenderness, ROM within normal Musculoskeletal: mild right lateral upper arm tender, otherwise nontender, no swelling, no obvious deformity Extremities: no edema, no cyanosis, no clubbing Pulses: 2+ symmetric, upper and lower extremities, normal cap refill Neurological: alert, oriented x 3, CN2-12 intact, strength normal upper extremities and lower extremities, nonfocal exam, gait normal Psychiatric: normal affect, behavior normal, pleasant    Assessment and Plan :    Encounter Diagnoses  Name Primary?  . Fall down stairs Yes  . Rib pain   . Back pain   . Toe pain, left    Advised she avoid re injury, discussed deep breathing and incentive spirometry, ice pack prn, advised that she likely has a rib contusion, but this can take a month or more to resolve completley.  She can use OTC Aleve for the next week prn, script for Ultram today.   Will send for xray of back  and ribs.  Discussed dangers of falls/fractures and discussed fall precautions.  Advised Ca+Vit D for osteopenia.  Follow-up in January as planned for physical.

## 2011-08-06 ENCOUNTER — Telehealth: Payer: Self-pay | Admitting: Internal Medicine

## 2011-08-06 ENCOUNTER — Other Ambulatory Visit: Payer: Self-pay | Admitting: Family Medicine

## 2011-08-06 MED ORDER — TRAMADOL HCL 50 MG PO TABS: ORAL_TABLET | ORAL | Status: DC

## 2011-08-06 NOTE — Telephone Encounter (Signed)
Her tramadol was renewed

## 2011-08-06 NOTE — Progress Notes (Signed)
Patient wanted a refill on tramadol. This was called in.

## 2011-08-23 ENCOUNTER — Other Ambulatory Visit: Payer: Self-pay | Admitting: Family Medicine

## 2011-08-23 MED ORDER — HYDROCOD POLST-CHLORPHEN POLST 10-8 MG/5ML PO LQCR: 5.0000 mL | Freq: Two times a day (BID) | ORAL | Status: DC | PRN

## 2011-08-23 NOTE — Telephone Encounter (Signed)
Called med in 

## 2011-08-23 NOTE — Telephone Encounter (Signed)
Pt called and states that she now has a cold with a cough and due to the broken rib (which she was seen here for) it is extremely painful when she coughs.  Is asking that you call in something for cough. [facility] [address]  Please call pt with status.

## 2011-08-23 NOTE — Telephone Encounter (Signed)
Call in some Tussionex

## 2011-08-24 ENCOUNTER — Encounter: Payer: Self-pay | Admitting: Medical

## 2011-08-24 ENCOUNTER — Ambulatory Visit (INDEPENDENT_AMBULATORY_CARE_PROVIDER_SITE_OTHER): Payer: Medicare Other | Admitting: Medical

## 2011-08-24 VITALS — BP 112/80 | HR 76 | Temp 98.1°F | Resp 16 | Wt 106.0 lb

## 2011-08-24 DIAGNOSIS — J40 Bronchitis, not specified as acute or chronic: Secondary | ICD-10-CM

## 2011-08-24 MED ORDER — HYDROCODONE-HOMATROPINE 5-1.5 MG/5ML PO SYRP: 5.0000 mL | ORAL_SOLUTION | Freq: Four times a day (QID) | ORAL | Status: AC | PRN

## 2011-08-24 MED ORDER — AZITHROMYCIN 500 MG PO TABS: 500.0000 mg | ORAL_TABLET | Freq: Every day | ORAL | Status: AC

## 2011-08-24 NOTE — Progress Notes (Signed)
Subjective:   HPI  Deborah Travis is a 70 y.o. female who presents  with 3 day history of productive cough, chest congestion, green nasal drainage, scratchy throat, chest discomfort with coughing, and she called in yesterday and Tussionex was called out, but she couldn't afford this. She is a smoker.  She is still recovering from recent rib contusion injury.  No other aggravating or relieving factors.    No other c/o.  The following portions of the patient's history were reviewed and updated as appropriate: allergies, current medications, past family history, past medical history, past social history, past surgical history and problem list.  Past Medical History  Diagnosis Date  . Hypertension   . Diabetes mellitus   . GERD (gastroesophageal reflux disease)   . Vitamin D deficiency   . Dyslipidemia   . Chronic kidney disease     RENAL STONE  . Duodenal ulcer   . Thyroid disease     HYPOTHYROID  . Chronic pancreatitis   . Osteopenia     Review of Systems Constitutional: +fever, -chills, -sweats, -unexpected -weight change,-fatigue ENT: +runny nose, -ear pain, +sore throat Cardiology:  -chest pain, -palpitations, -edema Respiratory: +cough, +shortness of breath, -wheezing Gastroenterology: -abdominal pain, -nausea, -vomiting, -diarrhea, -constipation Hematology: -bleeding or bruising problems Musculoskeletal: -arthralgias, -myalgias, -joint swelling, -back pain Ophthalmology: -vision changes Urology: -dysuria, -difficulty urinating, -hematuria, -urinary frequency, -urgency Neurology: -headache, -weakness, -tingling, -numbness   Objective:   Filed Vitals:   08/24/11 1542  BP: 112/80  Pulse: 76  Temp: 98.1 F (36.7 C)  Resp: 16    General appearance: Alert, WD/WN, no distress, ill appearing                             Skin: warm, no rash, no diaphoresis                           Head: no sinus tenderness                            Eyes: conjunctiva normal, corneas  clear, PERRLA                            Ears: pearly TMs, external ear canals normal                          Nose: septum midline, turbinates swollen, with erythema and clear discharge             Mouth/throat: MMM, tongue normal, mild pharyngeal erythema                           Neck: supple, no adenopathy, no thyromegaly, nontender                          Heart: RRR, normal S1, S2, no murmurs                         Lungs: +bronchial breath sounds, +scattered rhonchi, no wheezes, no rales                Extremities: no edema, nontender     Assessment and Plan:   Encounter Diagnosis  Name Primary?  . Bronchitis  Yes   Prescription given today for Azithromycin and Hydromet as below.  Discussed diagnosis and treatment of bronchitis.  Suggested symptomatic OTC remedies for cough and congestion.  Nasal saline spray for nasal congestion.  Tylenol or Ibuprofen OTC for fever and malaise.  Call/return in 2-3 days if symptoms are worse or not improving.  Advised that cough may linger even after the infection is improved.

## 2011-08-24 NOTE — Patient Instructions (Signed)
Acute Bronchitis Bronchitis is a problem of the air tubes leading to your lungs. Acute means the illness started quickly. In this condition, the lining of those tubes becomes puffy (swollen) and can leak fluid. This makes it harder for air to get in and out of your lungs. You may cough a lot. This is because the air tubes are narrow. Bronchitis is most often caused by a virus. Medicines that kill germs (antibiotics) may be needed with germ (bacteria) infections for people who:  Smoke.   Have lasting (chronic) lung problems.   Are elderly.  HOME CARE  Rest.   Drink enough water and fluids to keep the pee clear or pale yellow.   Only take medicine as told by your doctor.   Medicines may be prescribed that will open up the airways. This will help make breathing easier.   Bronchitis usually gets better on its own in a few days.  Recovery from some problems (symptoms) of bronchitis may be slow. You should start feeling a little better after 2 to 3 days. Coughing may last for 3 to 4 weeks. GET HELP RIGHT AWAY IF:  You or your child has a temperature by mouth above 101, not controlled by medicine.   Chills or chest pain develops.   You or your child develops very bad shortness of breath.   There is bloody saliva mixed with mucus (sputum).   You or your child throws up (vomits) often, loses too much fluid (dehydration), feels faint, or has a very bad headache.   You or your child does not improve after 1 week of treatment.  MAKE SURE YOU:   Understand these instructions.   Will watch this condition.   Will get help right away if you or your child is not doing well or gets worse.  Document Released: 01/30/2008 Document Re-Released: 11/07/2009 [employer] Patient Information 2011 [employer].  

## 2011-08-29 ENCOUNTER — Other Ambulatory Visit: Payer: Self-pay | Admitting: Family Medicine

## 2011-08-29 ENCOUNTER — Telehealth: Payer: Self-pay | Admitting: Medical

## 2011-08-29 ENCOUNTER — Other Ambulatory Visit: Payer: Self-pay | Admitting: Medical

## 2011-08-29 MED ORDER — AMOXICILLIN-POT CLAVULANATE 875-125 MG PO TABS: 1.0000 | ORAL_TABLET | Freq: Two times a day (BID) | ORAL | Status: AC

## 2011-08-29 MED ORDER — TRAMADOL HCL 50 MG PO TABS: 50.0000 mg | ORAL_TABLET | Freq: Four times a day (QID) | ORAL | Status: DC | PRN

## 2011-08-29 NOTE — Telephone Encounter (Signed)
Per chart, Augmentin already sent in today by Dr. Susann Givens.

## 2011-08-29 NOTE — Progress Notes (Signed)
She is no better. Still coughing and congested. I will switch her to Augmentin

## 2011-09-05 ENCOUNTER — Encounter: Payer: Self-pay | Admitting: Medical

## 2011-09-05 ENCOUNTER — Ambulatory Visit (INDEPENDENT_AMBULATORY_CARE_PROVIDER_SITE_OTHER): Payer: Medicare Other | Admitting: Medical

## 2011-09-05 VITALS — BP 90/60 | HR 72 | Temp 98.3°F | Resp 16 | Wt 105.0 lb

## 2011-09-05 DIAGNOSIS — R0989 Other specified symptoms and signs involving the circulatory and respiratory systems: Secondary | ICD-10-CM

## 2011-09-05 DIAGNOSIS — R0601 Orthopnea: Secondary | ICD-10-CM

## 2011-09-05 DIAGNOSIS — R0602 Shortness of breath: Secondary | ICD-10-CM | POA: Insufficient documentation

## 2011-09-05 DIAGNOSIS — R05 Cough: Secondary | ICD-10-CM

## 2011-09-05 DIAGNOSIS — J3489 Other specified disorders of nose and nasal sinuses: Secondary | ICD-10-CM | POA: Insufficient documentation

## 2011-09-05 DIAGNOSIS — R059 Cough, unspecified: Secondary | ICD-10-CM

## 2011-09-05 DIAGNOSIS — R062 Wheezing: Secondary | ICD-10-CM | POA: Insufficient documentation

## 2011-09-05 MED ORDER — ALBUTEROL SULFATE HFA 108 (90 BASE) MCG/ACT IN AERS: 2.0000 | INHALATION_SPRAY | Freq: Four times a day (QID) | RESPIRATORY_TRACT | Status: DC | PRN

## 2011-09-05 MED ORDER — METHYLPREDNISOLONE 4 MG PO KIT: PACK | ORAL | Status: AC

## 2011-09-05 NOTE — Progress Notes (Signed)
Subjective:   HPI  Deborah Travis is a 71 y.o. female who presents for recheck.  I saw her recently for bronchitis, she began on Azithromycin, but she called back about symptoms not improving.  Augmentin called out.  She has 3 days left on Augmentin.  Of note, she had rib contusion within last few months.  She reports ongoing cough, shortness of breath, wheezing, and in the last several days, sleeping inclined as she is unable to sleep flat due to SOB.  She reports left maxillary sinus pressure, green sputum, fatigue, head congestion, subjective fever, chills.  She denies chest pain, palpations, no aches.  She is using EchoStar, Mucinex.  Denies hx/o lung or heart disease.  Diabetes has been well controlled.  She notes hx/o having to use albuterol once prior for bronchitis, but no hx/o nebulizer or steroidal therapy.  She started smoking in her 46s, but threw her cigarettes out yesterday.  She has lately been smoking 3-4 cigarettes daily.  No other aggravating or relieving factors.    No other c/o.  The following portions of the patient's history were reviewed and updated as appropriate: allergies, current medications, past family history, past medical history, past social history, past surgical history and problem list.  Past Medical History  Diagnosis Date  . Hypertension   . Diabetes mellitus   . GERD (gastroesophageal reflux disease)   . Vitamin D deficiency   . Dyslipidemia   . Chronic kidney disease     RENAL STONE  . Duodenal ulcer   . Thyroid disease     HYPOTHYROID  . Chronic pancreatitis   . Osteopenia     Review of Systems Constitutional: +fever, +chills, -sweats, -unexpected -weight change, +fatigue ENT: +runny nose, -ear pain, +sore throat Cardiology:  -chest pain, -palpitations, -edema Respiratory: +cough, +shortness of breath, +wheezing Gastroenterology: -abdominal pain, -nausea, -vomiting, -diarrhea  Objective:   Filed Vitals:   09/05/11 1622  BP: 90/60  Pulse:  72  Temp: 98.3 F (36.8 C)  Resp: 16    General appearance: Alert, WD/WN, no distress                             Skin: warm, no rash, no diaphoresis                           Head: no sinus tenderness                            Eyes: conjunctiva normal, corneas clear, PERRLA                            Ears: pearly TMs, external ear canals normal                          Nose: septum midline, turbinates swollen, with erythema and clear discharge             Mouth/throat: MMM, tongue normal, mild pharyngeal erythema                           Neck: supple, no adenopathy, no thyromegaly, nontender                          Heart:  RRR, normal S1, S2, no murmurs                         Lungs: +bronchial breath sounds, +decreased breath sounds, few scattered wheezes, no rales                Extremities: no edema, nontender     Adult ECG Report  Indication: SOB, wheezing  Rate: 75 bpm  Rhythm: normal sinus rhythm  QRS Axis: -15 degrees  PR Interval:  QRS Duration: 64ms  QTc:  Conduction Disturbances: none  Other Abnormalities: low voltage, P wave borderline enlargement  Patient's cardiac risk factors are: advanced age (older than 41 for men, 56 for women), diabetes mellitus, dyslipidemia and hypertension.  EKG comparison: none  Narrative Interpretation: left axis deviation, otherwise normal   Assessment and Plan:   Encounter Diagnoses  Name Primary?  . Wheezing Yes  . Shortness of breath   . Orthopnea   . Cough   . Sinus pressure    Pulse ox 98% room air.  Discussed possible etiologies, but I suspect bronchial inflammation instead of worsening infection.   Will send for CXR, labs today to help further evaluate.   Continue Augmentin, add Medrol Dosepak, Albuterol, and we will call with results.  C/t Mucinex, rest.  If worse, call/return.

## 2011-09-06 ENCOUNTER — Ambulatory Visit
Admission: RE | Admit: 2011-09-06 | Discharge: 2011-09-06 | Disposition: A | Discharge status: Home / Self Care | Payer: Medicare Other | Source: Ambulatory Visit | Attending: Medical | Admitting: Medical

## 2011-09-06 LAB — CBC WITH DIFFERENTIAL/PLATELET
Eosinophils Absolute: 0.3 10*3/uL (ref 0.0–0.7)
Eosinophils Relative: 3 % (ref 0–5)
HCT: 39.5 % (ref 36.0–46.0)
Lymphs Abs: 3 10*3/uL (ref 0.7–4.0)
MCH: 29.9 pg (ref 26.0–34.0)
MCV: 94.5 fL (ref 78.0–100.0)
Monocytes Absolute: 0.9 10*3/uL (ref 0.1–1.0)
Monocytes Relative: 9 % (ref 3–12)
Platelets: 320 10*3/uL (ref 150–400)
RBC: 4.18 MIL/uL (ref 3.87–5.11)

## 2011-09-06 LAB — BASIC METABOLIC PANEL
BUN: 14 mg/dL (ref 6–23)
CO2: 21 mEq/L (ref 19–32)
Chloride: 106 mEq/L (ref 96–112)
Creat: 0.85 mg/dL (ref 0.50–1.10)
Glucose, Bld: 99 mg/dL (ref 70–99)
Potassium: 4.8 mEq/L (ref 3.5–5.3)

## 2011-09-20 ENCOUNTER — Encounter: Payer: Self-pay | Admitting: Internal Medicine

## 2011-09-20 ENCOUNTER — Ambulatory Visit: Payer: Medicare Other | Admitting: Family Medicine

## 2011-09-25 ENCOUNTER — Ambulatory Visit: Payer: Medicare Other | Admitting: Family Medicine

## 2011-10-09 ENCOUNTER — Encounter: Payer: Self-pay | Admitting: Family Medicine

## 2011-10-09 ENCOUNTER — Other Ambulatory Visit: Payer: Self-pay | Admitting: Family Medicine

## 2011-10-09 ENCOUNTER — Ambulatory Visit (INDEPENDENT_AMBULATORY_CARE_PROVIDER_SITE_OTHER): Payer: Medicare Other | Admitting: Family Medicine

## 2011-10-09 DIAGNOSIS — I10 Essential (primary) hypertension: Secondary | ICD-10-CM

## 2011-10-09 DIAGNOSIS — E785 Hyperlipidemia, unspecified: Secondary | ICD-10-CM

## 2011-10-09 DIAGNOSIS — K219 Gastro-esophageal reflux disease without esophagitis: Secondary | ICD-10-CM

## 2011-10-09 DIAGNOSIS — E1169 Type 2 diabetes mellitus with other specified complication: Secondary | ICD-10-CM

## 2011-10-09 DIAGNOSIS — I152 Hypertension secondary to endocrine disorders: Secondary | ICD-10-CM

## 2011-10-09 DIAGNOSIS — E1159 Type 2 diabetes mellitus with other circulatory complications: Secondary | ICD-10-CM

## 2011-10-09 DIAGNOSIS — E119 Type 2 diabetes mellitus without complications: Secondary | ICD-10-CM

## 2011-10-09 DIAGNOSIS — E559 Vitamin D deficiency, unspecified: Secondary | ICD-10-CM

## 2011-10-09 LAB — POCT UA - MICROALBUMIN: Albumin/Creatinine Ratio, Urine, POC: 10.3

## 2011-10-09 MED ORDER — PRAVASTATIN SODIUM 40 MG PO TABS: 40.0000 mg | ORAL_TABLET | Freq: Every day | ORAL | Status: DC

## 2011-10-09 MED ORDER — INSULIN ASPART PROT & ASPART (70-30 MIX) 100 UNIT/ML ~~LOC~~ SUSP: 10.0000 [IU] | Freq: Two times a day (BID) | SUBCUTANEOUS | Status: DC

## 2011-10-09 MED ORDER — METFORMIN HCL 1000 MG PO TABS: 1000.0000 mg | ORAL_TABLET | Freq: Two times a day (BID) | ORAL | Status: DC

## 2011-10-09 MED ORDER — ESOMEPRAZOLE MAGNESIUM 40 MG PO CPDR: 40.0000 mg | DELAYED_RELEASE_CAPSULE | Freq: Two times a day (BID) | ORAL | Status: DC

## 2011-10-09 MED ORDER — LISINOPRIL 10 MG PO TABS: 10.0000 mg | ORAL_TABLET | ORAL | Status: DC

## 2011-10-09 MED ORDER — LEVOTHYROXINE SODIUM 100 MCG PO TABS: 100.0000 ug | ORAL_TABLET | Freq: Every day | ORAL | Status: DC

## 2011-10-09 NOTE — Progress Notes (Signed)
  Subjective:    Patient ID: Deborah Travis, female    DOB: 15-Dec-1940, 71 y.o.   MRN: [id number]  HPI She is here for a diabetes he continues on medications listed in the chart. She keeps himself fairly active. She does smoke approximately 3 cigarettes per day. She does not drink she has had some difficulty with reflux and would like samples of Nexium which seemed to work fairly well. She does use insulin however no samples were available to give to her. She has a history of vitamin D deficiency. Apparently her last DEXA scan was over 2 years ago.   Review of Systems     Objective:   Physical Exam Alert and in no distress. Hemoglobin A1c 6.3. Foot exam shows normal pulses, sensation with no lesions. Reflexes not checked.      Assessment & Plan:   1. Diabetes mellitus  POCT HgB A1C, POCT UA - Microalbumin  2. Hypertension associated with diabetes    3. Hyperlipidemia LDL goal < 70    4. GERD (gastroesophageal reflux disease)    5. Vitamin d deficiency  DG Bone Density   continue on present medication regimen. Encouraged her to quit smoking entirely. Recheck here in 4 months

## 2011-10-09 NOTE — Telephone Encounter (Signed)
Renew her medications

## 2011-10-09 NOTE — Patient Instructions (Signed)
Stay on your present medications and cut out those last 3 cigarettes!

## 2011-10-09 NOTE — Telephone Encounter (Signed)
Pt called and said she needed a 90 day refill on all the meds you went over with her this morning.  Please refill to [facility].

## 2011-10-09 NOTE — Telephone Encounter (Signed)
Med sent in for 90 days 

## 2012-01-12 ENCOUNTER — Encounter (HOSPITAL_BASED_OUTPATIENT_CLINIC_OR_DEPARTMENT_OTHER): Payer: Self-pay | Admitting: Emergency Medicine

## 2012-01-12 ENCOUNTER — Inpatient Hospital Stay (HOSPITAL_COMMUNITY)
Admission: EM | Admit: 2012-01-12 | Discharge: 2012-01-14 | Disposition: A | Discharge status: Home / Self Care | Payer: Medicare Other | Source: Home / Self Care | Attending: Internal Medicine | Admitting: Internal Medicine

## 2012-01-12 ENCOUNTER — Emergency Department (HOSPITAL_BASED_OUTPATIENT_CLINIC_OR_DEPARTMENT_OTHER): Payer: Medicare Other

## 2012-01-12 DIAGNOSIS — L03119 Cellulitis of unspecified part of limb: Secondary | ICD-10-CM

## 2012-01-12 DIAGNOSIS — E119 Type 2 diabetes mellitus without complications: Secondary | ICD-10-CM | POA: Diagnosis present

## 2012-01-12 DIAGNOSIS — I10 Essential (primary) hypertension: Secondary | ICD-10-CM | POA: Diagnosis present

## 2012-01-12 DIAGNOSIS — L02519 Cutaneous abscess of unspecified hand: Secondary | ICD-10-CM | POA: Diagnosis present

## 2012-01-12 DIAGNOSIS — M949 Disorder of cartilage, unspecified: Secondary | ICD-10-CM | POA: Diagnosis present

## 2012-01-12 DIAGNOSIS — I959 Hypotension, unspecified: Secondary | ICD-10-CM

## 2012-01-12 DIAGNOSIS — S61409A Unspecified open wound of unspecified hand, initial encounter: Secondary | ICD-10-CM | POA: Diagnosis present

## 2012-01-12 DIAGNOSIS — E559 Vitamin D deficiency, unspecified: Secondary | ICD-10-CM | POA: Diagnosis present

## 2012-01-12 DIAGNOSIS — Z87442 Personal history of urinary calculi: Secondary | ICD-10-CM

## 2012-01-12 DIAGNOSIS — E118 Type 2 diabetes mellitus with unspecified complications: Secondary | ICD-10-CM | POA: Diagnosis present

## 2012-01-12 DIAGNOSIS — E039 Hypothyroidism, unspecified: Secondary | ICD-10-CM | POA: Diagnosis present

## 2012-01-12 DIAGNOSIS — M899 Disorder of bone, unspecified: Secondary | ICD-10-CM | POA: Diagnosis present

## 2012-01-12 DIAGNOSIS — W64XXXA Exposure to other animate mechanical forces, initial encounter: Secondary | ICD-10-CM | POA: Diagnosis present

## 2012-01-12 DIAGNOSIS — I129 Hypertensive chronic kidney disease with stage 1 through stage 4 chronic kidney disease, or unspecified chronic kidney disease: Secondary | ICD-10-CM | POA: Diagnosis present

## 2012-01-12 DIAGNOSIS — Z79899 Other long term (current) drug therapy: Secondary | ICD-10-CM

## 2012-01-12 DIAGNOSIS — IMO0001 Reserved for inherently not codable concepts without codable children: Secondary | ICD-10-CM | POA: Diagnosis present

## 2012-01-12 DIAGNOSIS — F17200 Nicotine dependence, unspecified, uncomplicated: Secondary | ICD-10-CM | POA: Diagnosis present

## 2012-01-12 DIAGNOSIS — E1169 Type 2 diabetes mellitus with other specified complication: Secondary | ICD-10-CM | POA: Diagnosis not present

## 2012-01-12 DIAGNOSIS — E785 Hyperlipidemia, unspecified: Secondary | ICD-10-CM | POA: Diagnosis present

## 2012-01-12 DIAGNOSIS — Y929 Unspecified place or not applicable: Secondary | ICD-10-CM

## 2012-01-12 DIAGNOSIS — Z885 Allergy status to narcotic agent status: Secondary | ICD-10-CM

## 2012-01-12 DIAGNOSIS — E032 Hypothyroidism due to medicaments and other exogenous substances: Secondary | ICD-10-CM

## 2012-01-12 DIAGNOSIS — K219 Gastro-esophageal reflux disease without esophagitis: Secondary | ICD-10-CM | POA: Diagnosis present

## 2012-01-12 DIAGNOSIS — Z794 Long term (current) use of insulin: Secondary | ICD-10-CM

## 2012-01-12 DIAGNOSIS — S50811A Abrasion of right forearm, initial encounter: Secondary | ICD-10-CM

## 2012-01-12 DIAGNOSIS — IMO0002 Reserved for concepts with insufficient information to code with codable children: Secondary | ICD-10-CM | POA: Diagnosis present

## 2012-01-12 DIAGNOSIS — R031 Nonspecific low blood-pressure reading: Secondary | ICD-10-CM | POA: Diagnosis present

## 2012-01-12 DIAGNOSIS — L0390 Cellulitis, unspecified: Secondary | ICD-10-CM | POA: Diagnosis present

## 2012-01-12 DIAGNOSIS — L03114 Cellulitis of left upper limb: Secondary | ICD-10-CM

## 2012-01-12 DIAGNOSIS — K8681 Exocrine pancreatic insufficiency: Secondary | ICD-10-CM | POA: Diagnosis present

## 2012-01-12 DIAGNOSIS — Y999 Unspecified external cause status: Secondary | ICD-10-CM

## 2012-01-12 DIAGNOSIS — N189 Chronic kidney disease, unspecified: Secondary | ICD-10-CM | POA: Diagnosis present

## 2012-01-12 LAB — DIFFERENTIAL
Basophils Absolute: 0 10*3/uL (ref 0.0–0.1)
Basophils Relative: 0 % (ref 0–1)
Eosinophils Absolute: 0 K/uL (ref 0.0–0.7)
Eosinophils Relative: 0 % (ref 0–5)
Lymphocytes Relative: 32 % (ref 12–46)
Lymphs Abs: 2.9 K/uL (ref 0.7–4.0)
Monocytes Absolute: 0.9 K/uL (ref 0.1–1.0)
Monocytes Relative: 11 % (ref 3–12)
Neutro Abs: 5.1 10*3/uL (ref 1.7–7.7)
Neutrophils Relative %: 57 % (ref 43–77)

## 2012-01-12 LAB — CBC
HCT: 35 % — ABNORMAL LOW (ref 36.0–46.0)
Hemoglobin: 12 g/dL (ref 12.0–15.0)
MCH: 32.2 pg (ref 26.0–34.0)
MCHC: 34.3 g/dL (ref 30.0–36.0)
MCV: 93.8 fL (ref 78.0–100.0)
Platelets: 209 K/uL (ref 150–400)
RBC: 3.73 MIL/uL — ABNORMAL LOW (ref 3.87–5.11)
RDW: 13.4 % (ref 11.5–15.5)
WBC: 9 10*3/uL (ref 4.0–10.5)

## 2012-01-12 LAB — BASIC METABOLIC PANEL WITH GFR
BUN: 12 mg/dL (ref 6–23)
CO2: 23 meq/L (ref 19–32)
Calcium: 8.8 mg/dL (ref 8.4–10.5)
Creatinine, Ser: 1.1 mg/dL (ref 0.50–1.10)
GFR calc non Af Amer: 50 mL/min — ABNORMAL LOW (ref >90)
Glucose, Bld: 174 mg/dL — ABNORMAL HIGH (ref 70–99)

## 2012-01-12 LAB — BASIC METABOLIC PANEL
Chloride: 110 mEq/L (ref 96–112)
GFR calc Af Amer: 58 mL/min — ABNORMAL LOW (ref >90)
Potassium: 3.6 mEq/L (ref 3.5–5.1)
Sodium: 142 mEq/L (ref 135–145)

## 2012-01-12 LAB — GLUCOSE, CAPILLARY
Glucose-Capillary: 81 mg/dL (ref 70–99)
Glucose-Capillary: 190 mg/dL — ABNORMAL HIGH (ref 70–99)

## 2012-01-12 LAB — CREATININE, SERUM: Creatinine, Ser: 0.91 mg/dL (ref 0.50–1.10)

## 2012-01-12 MED ORDER — ENOXAPARIN SODIUM 40 MG/0.4ML ~~LOC~~ SOLN
40.0000 mg | Freq: Every day | SUBCUTANEOUS | Status: DC
  Filled 2012-01-12 (×3): qty 0.4

## 2012-01-12 MED ORDER — CALCIUM CARBONATE 1250 (500 CA) MG PO TABS
1.0000 | ORAL_TABLET | Freq: Every day | ORAL | Status: DC
  Administered 2012-01-13 – 2012-01-14 (×2): 500 mg via ORAL
  Filled 2012-01-12 (×2): qty 1

## 2012-01-12 MED ORDER — BISACODYL 5 MG PO TBEC: 10.0000 mg | DELAYED_RELEASE_TABLET | Freq: Every day | ORAL | Status: DC | PRN

## 2012-01-12 MED ORDER — ONDANSETRON HCL 4 MG/2ML IJ SOLN: 4.0000 mg | Freq: Four times a day (QID) | INTRAMUSCULAR | Status: DC | PRN

## 2012-01-12 MED ORDER — OXYCODONE-ACETAMINOPHEN 5-325 MG PO TABS
1.0000 | ORAL_TABLET | ORAL | Status: DC | PRN
  Administered 2012-01-12: 1 via ORAL
  Administered 2012-01-13 – 2012-01-14 (×2): 2 via ORAL
  Filled 2012-01-12: qty 2
  Filled 2012-01-12: qty 1
  Filled 2012-01-12: qty 2

## 2012-01-12 MED ORDER — SODIUM CHLORIDE 0.9 % IV BOLUS (SEPSIS)
250.0000 mL | Freq: Once | INTRAVENOUS | Status: AC
  Administered 2012-01-12: 250 mL via INTRAVENOUS

## 2012-01-12 MED ORDER — LEVOTHYROXINE SODIUM 100 MCG PO TABS
100.0000 ug | ORAL_TABLET | Freq: Every day | ORAL | Status: DC
  Administered 2012-01-13 – 2012-01-14 (×2): 100 ug via ORAL
  Filled 2012-01-12 (×2): qty 1

## 2012-01-12 MED ORDER — ONDANSETRON HCL 4 MG PO TABS: 4.0000 mg | ORAL_TABLET | Freq: Four times a day (QID) | ORAL | Status: DC | PRN

## 2012-01-12 MED ORDER — INSULIN ASPART PROT & ASPART (70-30 MIX) 100 UNIT/ML ~~LOC~~ SUSP
10.0000 [IU] | Freq: Two times a day (BID) | SUBCUTANEOUS | Status: DC
  Administered 2012-01-12 – 2012-01-13 (×2): 10 [IU] via SUBCUTANEOUS
  Filled 2012-01-12 (×2): qty 3

## 2012-01-12 MED ORDER — HYDROMORPHONE HCL PF 1 MG/ML IJ SOLN
1.0000 mg | INTRAMUSCULAR | Status: DC | PRN
  Administered 2012-01-12 – 2012-01-14 (×6): 1 mg via INTRAVENOUS
  Filled 2012-01-12 (×6): qty 1

## 2012-01-12 MED ORDER — SIMVASTATIN 20 MG PO TABS
20.0000 mg | ORAL_TABLET | Freq: Every day | ORAL | Status: DC
  Administered 2012-01-13: 20 mg via ORAL
  Filled 2012-01-12 (×2): qty 1

## 2012-01-12 MED ORDER — CALCIUM CARBONATE 1250 (500 CA) MG PO CAPS: 1250.0000 mg | ORAL_CAPSULE | Freq: Every day | ORAL | Status: DC

## 2012-01-12 MED ORDER — ACETAMINOPHEN 325 MG PO TABS: 650.0000 mg | ORAL_TABLET | Freq: Four times a day (QID) | ORAL | Status: DC | PRN

## 2012-01-12 MED ORDER — FENTANYL CITRATE 0.05 MG/ML IJ SOLN
50.0000 ug | INTRAMUSCULAR | Status: DC | PRN
  Administered 2012-01-12: 50 ug via INTRAVENOUS
  Filled 2012-01-12: qty 2

## 2012-01-12 MED ORDER — VITAMIN D3 25 MCG (1000 UNIT) PO TABS
1000.0000 [IU] | ORAL_TABLET | Freq: Every day | ORAL | Status: DC
  Administered 2012-01-13 – 2012-01-14 (×2): 1000 [IU] via ORAL
  Filled 2012-01-12 (×2): qty 1

## 2012-01-12 MED ORDER — ADULT MULTIVITAMIN W/MINERALS CH
1.0000 | ORAL_TABLET | Freq: Every day | ORAL | Status: DC
  Administered 2012-01-13 – 2012-01-14 (×2): 1 via ORAL
  Filled 2012-01-12 (×2): qty 1

## 2012-01-12 MED ORDER — SODIUM CHLORIDE 0.9 % IV SOLN
Freq: Once | INTRAVENOUS | Status: AC
  Administered 2012-01-12: 13:00:00 via INTRAVENOUS

## 2012-01-12 MED ORDER — PANTOPRAZOLE SODIUM 40 MG PO TBEC: 40.0000 mg | DELAYED_RELEASE_TABLET | Freq: Every day | ORAL | Status: DC

## 2012-01-12 MED ORDER — SENNA 8.6 MG PO TABS
1.0000 | ORAL_TABLET | Freq: Two times a day (BID) | ORAL | Status: DC
  Administered 2012-01-14: 8.6 mg via ORAL
  Filled 2012-01-12 (×5): qty 1

## 2012-01-12 MED ORDER — PANTOPRAZOLE SODIUM 40 MG PO TBEC
40.0000 mg | DELAYED_RELEASE_TABLET | Freq: Every day | ORAL | Status: DC
  Administered 2012-01-13 – 2012-01-14 (×2): 40 mg via ORAL
  Filled 2012-01-12 (×2): qty 1

## 2012-01-12 MED ORDER — SODIUM CHLORIDE 0.9 % IV SOLN
INTRAVENOUS | Status: DC
  Administered 2012-01-12: via INTRAVENOUS
  Administered 2012-01-13: 100 mL via INTRAVENOUS

## 2012-01-12 MED ORDER — FENTANYL CITRATE 0.05 MG/ML IJ SOLN
INTRAMUSCULAR | Status: AC
  Administered 2012-01-12: 50 ug
  Filled 2012-01-12: qty 2

## 2012-01-12 MED ORDER — METFORMIN HCL 500 MG PO TABS
1000.0000 mg | ORAL_TABLET | Freq: Two times a day (BID) | ORAL | Status: DC
  Administered 2012-01-13 – 2012-01-14 (×3): 1000 mg via ORAL
  Filled 2012-01-12 (×5): qty 2

## 2012-01-12 MED ORDER — METFORMIN HCL 500 MG PO TABS
1000.0000 mg | ORAL_TABLET | Freq: Two times a day (BID) | ORAL | Status: DC
  Filled 2012-01-12: qty 2

## 2012-01-12 MED ORDER — SODIUM CHLORIDE 0.9 % IV SOLN
3.0000 g | Freq: Three times a day (TID) | INTRAVENOUS | Status: DC
  Administered 2012-01-12 – 2012-01-13 (×5): 3 g via INTRAVENOUS
  Filled 2012-01-12 (×8): qty 3

## 2012-01-12 MED ORDER — ACETAMINOPHEN 650 MG RE SUPP: 650.0000 mg | Freq: Four times a day (QID) | RECTAL | Status: DC | PRN

## 2012-01-12 MED ORDER — INSULIN ASPART 100 UNIT/ML ~~LOC~~ SOLN: 0.0000 [IU] | Freq: Three times a day (TID) | SUBCUTANEOUS | Status: DC

## 2012-01-12 MED ORDER — SODIUM CHLORIDE 0.9 % IV SOLN
3.0000 g | Freq: Once | INTRAVENOUS | Status: AC
  Administered 2012-01-12: 3 g via INTRAVENOUS
  Filled 2012-01-12 (×2): qty 3

## 2012-01-12 MED ORDER — FENTANYL CITRATE 0.05 MG/ML IJ SOLN
50.0000 ug | Freq: Once | INTRAMUSCULAR | Status: AC
  Administered 2012-01-12: 50 ug via INTRAVENOUS
  Filled 2012-01-12: qty 2

## 2012-01-12 NOTE — ED Provider Notes (Signed)
History     CSN: [id number]  Arrival date & time 01/12/12  1002   First MD Initiated Contact with Patient 01/12/12 1132      Chief Complaint  Patient presents with  . Animal Bite    (Consider location/radiation/quality/duration/timing/severity/associated sxs/prior treatment) HPI Comments: Pt reports yesterday her cat was scared by her family members dog and her cat ended up biting her several times to left hand around thumb and radial portion of hand and wrist area.  Minimal bleeding. Also was scratched by cat on left forearm and scratched on right forearm by the dog.  Both animcals have had all shot including rabies.  Pt's tetanus is UTD.  No fevers or chills, no N/V/D.  Right arm and thumb is painful, thumb is swollen.  No meds PTA.  Hurst to move at thumb on left.  Pt is diabetic and noted fluctuation of sugars at home.    Patient is a 71 y.o. female presenting with animal bite. The history is provided by the patient.  Animal Bite  Pertinent negatives include no numbness and no weakness.    Past Medical History  Diagnosis Date  . Hypertension   . Diabetes mellitus   . GERD (gastroesophageal reflux disease)   . Vitamin d deficiency   . Dyslipidemia   . Chronic kidney disease     RENAL STONE  . Duodenal ulcer   . Thyroid disease     HYPOTHYROID  . Chronic pancreatitis   . Osteopenia     Past Surgical History  Procedure Date  . Cholecystectomy   . Whipple procedure 1998  . Appendectomy   . Tonsilectomy, adenoidectomy, bilateral myringotomy and tubes     Family History  Problem Relation Age of Onset  . Stroke Mother   . Heart disease Father   . Diabetes Sister     History  Substance Use Topics  . Smoking status: Current Everyday Smoker -- 0.0 packs/day  . Smokeless tobacco: Never Used   Comment: 3 cigarettes a day  . Alcohol Use: No    OB History    Grav Para Term Preterm Abortions TAB SAB Ect Mult Living                  Review of Systems    Constitutional: Negative for fever and chills.  Musculoskeletal: Positive for joint swelling and arthralgias.  Skin: Positive for color change and wound.  Neurological: Negative for weakness and numbness.  All other systems reviewed and are negative.    Allergies  Codeine and Morphine and related  Home Medications   Current Outpatient Rx  Name Route Sig Dispense Refill  . ALBUTEROL SULFATE HFA 108 (90 BASE) MCG/ACT IN AERS Inhalation Inhale 2 puffs into the lungs every 6 (six) hours as needed for wheezing. 1 Inhaler 0  . CALCIUM CARBONATE 1250 MG PO CAPS Oral Take 1,250 mg by mouth 2 (two) times daily with a meal.      . VITAMIN D 1000 UNITS PO TABS Oral Take 1,000 Units by mouth daily.      Marland Kitchen ESOMEPRAZOLE MAGNESIUM 40 MG PO CPDR Oral Take 1 capsule (40 mg total) by mouth 2 (two) times daily. 90 capsule 3  . INSULIN ASPART PROT & ASPART (70-30) 100 UNIT/ML Paden SUSP Subcutaneous Inject 10 Units into the skin 2 (two) times daily with a meal. 30 mL 1  . LEVOTHYROXINE SODIUM 100 MCG PO TABS Oral Take 1 tablet (100 mcg total) by mouth daily. 90 tablet 1  .  LISINOPRIL 10 MG PO TABS Oral Take 1 tablet (10 mg total) by mouth 1 day or 1 dose. 90 tablet 3  . METFORMIN HCL 1000 MG PO TABS Oral Take 1 tablet (1,000 mg total) by mouth 2 (two) times daily with a meal. 180 tablet 1  . ONE-DAILY MULTI VITAMINS PO TABS Oral Take 1 tablet by mouth daily.      Marland Kitchen PRAVASTATIN SODIUM 40 MG PO TABS Oral Take 1 tablet (40 mg total) by mouth daily. 90 tablet 1  . ZOLPIDEM TARTRATE 5 MG PO TABS Oral Take 1 tablet (5 mg total) by mouth at bedtime as needed for sleep. 15 tablet 0    BP 93/56  Pulse 70  Temp(Src) 98.2 F (36.8 C) (Oral)  Resp 16  SpO2 98%  Physical Exam  Nursing note and vitals reviewed. Constitutional: She is oriented to person, place, and time. She appears well-developed and well-nourished. No distress.  HENT:  Head: Normocephalic.  Eyes: No scleral icterus.  Neck: Normal range of  motion. Neck supple.  Cardiovascular: Normal rate.   Pulmonary/Chest: Effort normal.  Abdominal: Soft. There is no tenderness.  Musculoskeletal:       Left wrist: She exhibits decreased range of motion, tenderness and swelling. She exhibits no bony tenderness, no deformity and no laceration.       Left hand: She exhibits decreased range of motion and tenderness. She exhibits normal capillary refill, no deformity and no laceration. normal sensation noted.       Hands: Neurological: She is alert and oriented to person, place, and time.  Skin: Skin is warm and dry. No pallor.    ED Course  Procedures (including critical care time)  Labs Reviewed  CBC - Abnormal; Notable for the following:    RBC 3.73 (*)    HCT 35.0 (*)    All other components within normal limits  BASIC METABOLIC PANEL - Abnormal; Notable for the following:    Glucose, Bld 174 (*)    GFR calc non Af Amer 50 (*)    GFR calc Af Amer 58 (*)    All other components within normal limits  DIFFERENTIAL   Dg Hand Complete Left  01/12/2012  *RADIOLOGY REPORT*  Clinical Data: Left hand injury and pain.  LEFT HAND - COMPLETE 3+ VIEW  Comparison: None  Findings: No evidence of acute fracture, subluxation or dislocation identified.  No radio-opaque foreign bodies are present.  No focal bony lesions are noted.  The joint spaces are unremarkable.  IMPRESSION: No evidence of bony abnormality.  Original Report Authenticated By: Rosendo Gros, M.D.   I reviewed the above plain film myself.    1. Cellulitis of left hand   2. Abrasion of right forearm   3. Diabetes mellitus       MDM  Pt with multiple bite marks by cat, tetanus is UTD and rabies are UTD of animals per pt.  However given evidence of early cellulitis, swelling and pt is diabetic, will start IV unasyn and admit to hospital. Pt is agreeable.          Gavin Pound. Etty Isaac, MD 01/12/12 1511

## 2012-01-12 NOTE — Progress Notes (Signed)
ANTIBIOTIC CONSULT NOTE - INITIAL  Pharmacy Consult for Unasyn Indication: Cellulitis of left hand   Allergies  Allergen Reactions  . Codeine Itching  . Morphine And Related Itching    Patient Measurements: Height: 5\' 3"  (160 cm) Weight: 112 lb 3.4 oz (50.9 kg) IBW/kg (Calculated) : 52.4    Vital Signs: Temp: 98.2 F (36.8 C) (05/18 1637) Temp src: Oral (05/18 1637) BP: 102/67 mmHg (05/18 1637) Pulse Rate: 71  (05/18 1637) Intake/Output from previous day:   Intake/Output from this shift:    Labs:  Basename 01/12/12 1223  WBC 9.0  HGB 12.0  PLT 209  LABCREA --  CREATININE 1.10   Estimated Creatinine Clearance: 39.3 ml/min (by C-G formula based on Cr of 1.1). No results found for this basename: VANCOTROUGH:2,VANCOPEAK:2,VANCORANDOM:2,GENTTROUGH:2,GENTPEAK:2,GENTRANDOM:2,TOBRATROUGH:2,TOBRAPEAK:2,TOBRARND:2,AMIKACINPEAK:2,AMIKACINTROU:2,AMIKACIN:2, in the last 72 hours   Microbiology: No results found for this or any previous visit (from the past 720 hour(s)).  Medical History: Past Medical History  Diagnosis Date  . Hypertension   . Diabetes mellitus   . GERD (gastroesophageal reflux disease)   . Vitamin d deficiency   . Dyslipidemia   . Chronic kidney disease     RENAL STONE  . Duodenal ulcer   . Thyroid disease     HYPOTHYROID  . Chronic pancreatitis   . Osteopenia     Medications:  Prescriptions prior to admission  Medication Sig Dispense Refill  . calcium carbonate 1250 MG capsule Take 1,250 mg by mouth daily.       . cholecalciferol (VITAMIN D) 1000 UNITS tablet Take 1,000 Units by mouth daily.        Marland Kitchen esomeprazole (NEXIUM) 40 MG capsule Take 40 mg by mouth daily.      . insulin aspart protamine-insulin aspart (NOVOLOG 70/30) (70-30) 100 UNIT/ML injection Inject 10 Units into the skin 2 (two) times daily with a meal.  30 mL  1  . levothyroxine (SYNTHROID, LEVOTHROID) 100 MCG tablet Take 1 tablet (100 mcg total) by mouth daily.  90 tablet  1  .  lisinopril (PRINIVIL,ZESTRIL) 10 MG tablet Take 10 mg by mouth daily.      . metFORMIN (GLUCOPHAGE) 1000 MG tablet Take 1 tablet (1,000 mg total) by mouth 2 (two) times daily with a meal.  180 tablet  1  . Multiple Vitamin (MULITIVITAMIN WITH MINERALS) TABS Take 1 tablet by mouth daily.      . pravastatin (PRAVACHOL) 40 MG tablet Take 1 tablet (40 mg total) by mouth daily.  90 tablet  1   Scheduled:    . sodium chloride   Intravenous Once  . ampicillin-sulbactam (UNASYN) IV  3 g Intravenous Once  . calcium carbonate  1,250 mg Oral Daily  . cholecalciferol  1,000 Units Oral Daily  . enoxaparin  40 mg Subcutaneous Q24H  . fentaNYL      . fentaNYL  50 mcg Intravenous Once  . insulin aspart  0-9 Units Subcutaneous TID WC  . insulin aspart protamine-insulin aspart  10 Units Subcutaneous BID WC  . levothyroxine  100 mcg Oral Daily  . metFORMIN  1,000 mg Oral BID WC  . mulitivitamin with minerals  1 tablet Oral Daily  . pantoprazole  40 mg Oral Daily  . senna  1 tablet Oral BID  . simvastatin  20 mg Oral q1800  . sodium chloride  250 mL Intravenous Once  . DISCONTD: pantoprazole  40 mg Oral Daily   Assessment: 71 y.o. Female with multiple bite marks by cat;  evidence of  early cellulitis, swelling and pt is diabetic. Starting IV Unasyn.  SCr =1.1,  Estimated CrCl ~ 39 ml/min   Plan:  Unasyn 3gm  IV q8h  Arman Filter 01/12/2012,7:04 PM

## 2012-01-12 NOTE — ED Notes (Addendum)
Pt c/o pain to LT wrist/hand s/p cat bite yesterday (also has bite from dog on RT forearm- both animals are  up to date on vaccines)

## 2012-01-12 NOTE — H&P (Signed)
[facility] History and Physical  Deborah Travis ZOX:[id number] DOB: 1941/06/29 DOA: 01/12/2012  Referring physician:  PCP: Carollee Herter, MD, MD   Chief Complaint: Left wrist area/forearm pain and swelling  HPI:  The patient is a 71 year old white female with past medical history significant for diabetes and mellitus will arm presents following a cat bite yesterday at about 10:30 AM, to the radial aspect of her left hand and forearm/wrist, and a superficial scratches to her right forearm by a dog. She states that her cat was scared by a family member's are dog and ended up biting her several times to the areas noted above and she also sustained superficial scratches from the dog as well. She states that overnight she began having some swelling to the right hand/forearm area, and today it was more swollen and much more painful and so she went to the [facility] ED. She denies fevers and she's not had any drainage. It is noted that both the cat and the dog are up to date on their shots. At the [facility] ED x-ray of the left hand was done and showed no evidence of bony abnormality. She was started on IV antibiotics and admitted for further evaluation and management.  Review of Systems She denies chest pain, cough, dysuria, diarrhea, melena, hematochezia, nausea or vomiting, polyuria, polyphagia, polydipsia, dizziness, fevers, shortness of breath, leg swelling, and no hemoptysis. Other review of systems as per history of present illness. Past Medical History  Diagnosis Date  . Hypertension   . Diabetes mellitus   . GERD (gastroesophageal reflux disease)   . Vitamin d deficiency   . Dyslipidemia   . Chronic kidney disease     RENAL STONE  . Duodenal ulcer   . Thyroid disease     HYPOTHYROID  . Chronic pancreatitis   . Osteopenia    Past Surgical History  Procedure Date  . Cholecystectomy   . Whipple procedure 1998  . Appendectomy   . Tonsilectomy, adenoidectomy, bilateral  myringotomy and tubes    Social History:  reports that she has been smoking.  She has never used smokeless tobacco. She reports that she does not drink alcohol or use illicit drugs.  Allergies  Allergen Reactions  . Codeine Itching  . Morphine And Related Itching    Family History  Problem Relation Age of Onset  . Stroke Mother   . Heart disease Father   . Diabetes Sister     Prior to Admission medications   Medication Sig Start Date End Date Taking? Authorizing Provider  calcium carbonate 1250 MG capsule Take 1,250 mg by mouth daily.    Yes Historical Provider, MD  cholecalciferol (VITAMIN D) 1000 UNITS tablet Take 1,000 Units by mouth daily.     Yes Historical Provider, MD  esomeprazole (NEXIUM) 40 MG capsule Take 40 mg by mouth daily.   Yes Historical Provider, MD  insulin aspart protamine-insulin aspart (NOVOLOG 70/30) (70-30) 100 UNIT/ML injection Inject 10 Units into the skin 2 (two) times daily with a meal. 10/09/11  Yes Ronnald Nian, MD  levothyroxine (SYNTHROID, LEVOTHROID) 100 MCG tablet Take 1 tablet (100 mcg total) by mouth daily. 10/09/11  Yes Ronnald Nian, MD  lisinopril (PRINIVIL,ZESTRIL) 10 MG tablet Take 10 mg by mouth daily.   Yes Historical Provider, MD  metFORMIN (GLUCOPHAGE) 1000 MG tablet Take 1 tablet (1,000 mg total) by mouth 2 (two) times daily with a meal. 10/09/11 10/08/12 Yes Ronnald Nian, MD  Multiple Vitamin [facility]  WITH MINERALS) TABS Take 1 tablet by mouth daily.   Yes Historical Provider, MD  pravastatin (PRAVACHOL) 40 MG tablet Take 1 tablet (40 mg total) by mouth daily. 10/09/11  Yes Ronnald Nian, MD   Physical Exam: Filed Vitals:   01/12/12 1013 01/12/12 1414 01/12/12 1503 01/12/12 1637  BP: 96/56 83/51 93/56  102/67  Pulse: 71 69 70 71  Temp: 97.6 F (36.4 C)  98.2 F (36.8 C) 98.2 F (36.8 C)  TempSrc: Oral   Oral  Resp: 16 14 16 16   Height:    5\' 3"  (1.6 m)  Weight:    50.9 kg (112 lb 3.4 oz)  SpO2: 100% 98%  100%    Constitutional: Vital signs reviewed.  Patient is a well-developed and well-nourished, in no acute distress and cooperative with exam. Alert and oriented x3.  Head: Normocephalic and atraumatic Mouth: no erythema or exudates, MMM Eyes: PERRL, EOMI, conjunctivae normal, No scleral icterus.  Neck: Supple, Trachea midline normal ROM, No JVD, mass, thyromegaly, or carotid bruit present.  Cardiovascular: RRR, S1 normal, S2 normal, no MRG, pulses symmetric and intact bilaterally Pulmonary/Chest: CTAB, no wheezes, rales, or rhonchi Abdominal: Soft. Non-tender, non-distended, bowel sounds are normal, no masses, organomegaly, or guarding present. Extremities: Left upper extremity radial aspect of left hand and wrist area edematous and tender, scratch marks noted, no erythema, warm. Superficial scratch marks noted on her right forearm. No edema or tenderness on the right. Lower extremities with no cyanosis or edema Neurological: A&O x3, Strength is normal and symmetric bilaterally, cranial nerve II-XII are grossly intact, no focal motor deficit, sensory intact to light touch bilaterally.  Psychiatric: Normal mood and affect. speech and behavior is normal.  Labs on Admission:  Basic Metabolic Panel:  Lab 01/12/12 1610  NA 142  K 3.6  CL 110  CO2 23  GLUCOSE 174*  BUN 12  CREATININE 1.10  CALCIUM 8.8  MG --  PHOS --   Liver Function Tests: No results found for this basename: AST:5,ALT:5,ALKPHOS:5,BILITOT:5,PROT:5,ALBUMIN:5 in the last 168 hours No results found for this basename: LIPASE:5,AMYLASE:5 in the last 168 hours No results found for this basename: AMMONIA:5 in the last 168 hours CBC:  Lab 01/12/12 1223  WBC 9.0  NEUTROABS 5.1  HGB 12.0  HCT 35.0*  MCV 93.8  PLT 209   Cardiac Enzymes: No results found for this basename: CKTOTAL:5,CKMB:5,CKMBINDEX:5,TROPONINI:5 in the last 168 hours BNP: No components found with this basename: POCBNP:5 CBG:  Lab 01/12/12 1739  GLUCAP 81     Radiological Exams on Admission: Dg Hand Complete Left  01/12/2012  *RADIOLOGY REPORT*  Clinical Data: Left hand injury and pain.  LEFT HAND - COMPLETE 3+ VIEW  Comparison: None  Findings: No evidence of acute fracture, subluxation or dislocation identified.  No radio-opaque foreign bodies are present.  No focal bony lesions are noted.  The joint spaces are unremarkable.  IMPRESSION: No evidence of bony abnormality.  Original Report Authenticated By: Rosendo Gros, M.D.      Assessment/Plan Principal Problem:  *Cellulitis, left upper extremity-status post cat bite -Will obtain blood cultures continue IV antibiotics-Unasyn that will include coverage for possible Pasteurella Multicoida. -Pain management and follow.  Active Problems:  Hypotension -Possibly secondary to #1 (although NO other findings consistent with a SIRS/sepsis such as tachycardia or leukocytosis/leukopenia) -Obtain blood cultures as above, antibiotics as above. Hemoglobin is stable. She is chest pain-free -We'll bolus with IV fluids monitor and further treat accordingly.  Diabetes mellitus -Continue outpatient meds,  also cover, with sliding-scale insulin and follow.  GERD (gastroesophageal reflux disease) -Continue PPI  hypothyroidism -Continue Synthroid  Code Status: Full code  Kela Millin, MD  [facility] Pager [phone]  If 7PM-7AM, please contact night-coverage [email or web address] Password [email or web address] 01/12/2012, 7:10 PM

## 2012-01-12 NOTE — ED Notes (Signed)
Pt c/o of pain -8- order reced for pain med

## 2012-01-12 NOTE — Progress Notes (Signed)
Deborah Travis [id number] Admission Data: 01/12/2012 7:13 PM Attending Provider: Kela Millin, MD  WGN:[id number] Deborah Most, MD, MD Consults/ Treatment Team:    Deborah Travis is a 71 y.o. female patient admitted from [facility] with left hand cellulitis related to dog and cat bites on her upper extremities awake, alert  & orientated  X 3,  No Order, VSS - Blood pressure 102/67, pulse 71, temperature 98.2 F (36.8 C), temperature source Oral, resp. rate 16, height 5\' 3"  (1.6 m), weight 50.9 kg (112 lb 3.4 oz), SpO2 100.00%.,  no c/o shortness of breath, no c/o chest pain, no distress noted.    IV site WDL:  antecubital right, condition patent and no redness with a transparent dsg that's clean dry and intact.  Allergies:   Allergies  Allergen Reactions  . Codeine Itching  . Morphine And Related Itching     Past Medical History  Diagnosis Date  . Hypertension   . Diabetes mellitus   . GERD (gastroesophageal reflux disease)   . Vitamin d deficiency   . Dyslipidemia   . Chronic kidney disease     RENAL STONE  . Duodenal ulcer   . Thyroid disease     HYPOTHYROID  . Chronic pancreatitis   . Osteopenia     History:  obtained from the patient.   Pt orientation to unit, room and routine. Information packet given to patient/family and safety video watched.  Admission INP armband ID verified with patient/family, and in place. SR up x 2, fall risk assessment complete with Patient and family verbalizing understanding of risks associated with falls. Pt verbalizes an understanding of how to use the call bell and to call for help before getting out of bed.  Skin, clean-dry- intact without evidence of bruising, or skin tears.   No evidence of skin break down noted on exam.     Will cont to monitor and assist as needed.  Cindra Eves, RN 01/12/2012 7:13 PM

## 2012-01-13 LAB — GLUCOSE, CAPILLARY: Glucose-Capillary: 95 mg/dL (ref 70–99)

## 2012-01-13 LAB — CBC
MCH: 31 pg (ref 26.0–34.0)
MCV: 93.8 fL (ref 78.0–100.0)
Platelets: 198 10*3/uL (ref 150–400)
RDW: 13.9 % (ref 11.5–15.5)
WBC: 9.8 10*3/uL (ref 4.0–10.5)

## 2012-01-13 NOTE — Progress Notes (Signed)
Pt was discovered smoking in the bathroom. Cigarettes confiscated and put in med drawer. Will text MD for an order for nicotine patch. Peter Congo RN

## 2012-01-13 NOTE — Progress Notes (Signed)
CBG: 51  Treatment: 15 GM carbohydrate snack  Symptoms: Shaky  Follow-up CBG: Time:1230 CBG Result:59 Possible Reasons for Event: Inadequate meal intake  Comments/MD notified:  Pt was given orange juice and pt started to eat lunch, CBG retaken at 1255 was 79  Jai Bear Czarnecki

## 2012-01-13 NOTE — Progress Notes (Signed)
This AM before breakfast pts CBG 88,  after receiving 10 units of 70/30 CBG 51 at lunch time. Pt's CBG now 95. Dr. Arthor Captain notified orders obtained to hold PM dose of 70/30 scheduled for now. Deborah Travis [facility]

## 2012-01-13 NOTE — Progress Notes (Signed)
DAILY PROGRESS NOTE                              [facility]  SUBJECTIVE: Complaining about pain, patient cannot make a grip with her left hand  OBJECTIVE: BP 93/50  Pulse 71  Temp(Src) 98.5 F (36.9 C) (Oral)  Resp 18  Ht 5\' 3"  (1.6 m)  Wt 50.9 kg (112 lb 3.4 oz)  BMI 19.88 kg/m2  SpO2 93%  Intake/Output Summary (Last 24 hours) at 01/13/12 1254 Last data filed at 01/13/12 0900  Gross per 24 hour  Intake 1581.66 ml  Output      2 ml  Net 1579.66 ml                      Weight change:  Physical Exam: General: Alert and awake oriented x3 not in any acute distress. HEENT: anicteric sclera, pupils equal reactive to light and accommodation CVS: S1-S2 heard, no murmur rubs or gallops Chest: clear to auscultation bilaterally, no wheezing rales or rhonchi Abdomen:  normal bowel sounds, soft, nontender, nondistended, no organomegaly Neuro: Cranial nerves II-XII intact, no focal neurological deficits Extremities: no cyanosis, no clubbing or edema noted bilaterally   Lab Results:  Basename 01/12/12 2100 01/12/12 1223  NA -- 142  K -- 3.6  CL -- 110  CO2 -- 23  GLUCOSE -- 174*  BUN -- 12  CREATININE 0.91 1.10  CALCIUM -- 8.8  MG -- --  PHOS -- --   No results found for this basename: AST:2,ALT:2,ALKPHOS:2,BILITOT:2,PROT:2,ALBUMIN:2 in the last 72 hours No results found for this basename: LIPASE:2,AMYLASE:2 in the last 72 hours  Basename 01/13/12 0515 01/12/12 1223  WBC 9.8 9.0  NEUTROABS -- 5.1  HGB 11.4* 12.0  HCT 34.5* 35.0*  MCV 93.8 93.8  PLT 198 209   No results found for this basename: CKTOTAL:3,CKMB:3,CKMBINDEX:3,TROPONINI:3 in the last 72 hours No components found with this basename: POCBNP:3 No results found for this basename: DDIMER:2 in the last 72 hours No results found for this basename: HGBA1C:2 in the last 72 hours No results found for this basename: CHOL:2,HDL:2,LDLCALC:2,TRIG:2,CHOLHDL:2,LDLDIRECT:2 in the last 72  hours No results found for this basename: TSH,T4TOTAL,FREET3,T3FREE,THYROIDAB in the last 72 hours No results found for this basename: VITAMINB12:2,FOLATE:2,FERRITIN:2,TIBC:2,IRON:2,RETICCTPCT:2 in the last 72 hours  Micro Results: No results found for this or any previous visit (from the past 240 hour(s)).  Studies/Results: Dg Hand Complete Left  01/12/2012  *RADIOLOGY REPORT*  Clinical Data: Left hand injury and pain.  LEFT HAND - COMPLETE 3+ VIEW  Comparison: None  Findings: No evidence of acute fracture, subluxation or dislocation identified.  No radio-opaque foreign bodies are present.  No focal bony lesions are noted.  The joint spaces are unremarkable.  IMPRESSION: No evidence of bony abnormality.  Original Report Authenticated By: Rosendo Gros, M.D.   Medications: Scheduled Meds:   . ampicillin-sulbactam (UNASYN) IV  3 g Intravenous Once  . ampicillin-sulbactam (UNASYN) IV  3 g Intravenous Q8H  . calcium carbonate  1 tablet Oral Daily  . cholecalciferol  1,000 Units Oral Daily  . enoxaparin  40 mg Subcutaneous QHS  . fentaNYL      . insulin aspart  0-9 Units Subcutaneous TID WC  . insulin aspart protamine-insulin aspart  10 Units Subcutaneous BID WC  . levothyroxine  100 mcg Oral Daily  . metFORMIN  1,000 mg Oral BID WC  . mulitivitamin  with minerals  1 tablet Oral Daily  . pantoprazole  40 mg Oral Daily  . senna  1 tablet Oral BID  . simvastatin  20 mg Oral q1800  . sodium chloride  250 mL Intravenous Once  . DISCONTD: calcium carbonate  1,250 mg Oral Daily  . DISCONTD: metFORMIN  1,000 mg Oral BID WC  . DISCONTD: pantoprazole  40 mg Oral Daily   Continuous Infusions:   . sodium chloride 100 mL (01/13/12 1103)   PRN Meds:.acetaminophen, acetaminophen, bisacodyl, HYDROmorphone (DILAUDID) injection, ondansetron (ZOFRAN) IV, ondansetron, oxyCODONE-acetaminophen, DISCONTD: fentaNYL  ASSESSMENT & PLAN: Principal Problem:  *Cellulitis Active Problems:  Diabetes  mellitus  GERD (gastroesophageal reflux disease)  Hypotension  Hypothyroidism   Left upper extremity cellulitis -Secondary to a cat bite, blood culture obtained.  -Patient started on Unasyn for coverage for possible Pasteurella multocida. -Improving, likely can be discharged in the morning on oral Augmentin.  Hypotension -No finding consistent with SIRS/sepsis. This is likely transient hypotension -Bolus of IV fluids monitor her and treat accordingly.  Diabetes mellitus type 2 -Continued outpatient medication. Seems controlled, less hemoglobin A1c was 6.3 on 10/09/2011. -Carbohydrate modified diet, insulin sliding scale.  Hypothyroidism -Continue Synthroid.   LOS: 1 day   Tahirah Sara A 01/13/2012, 12:54 PM

## 2012-01-14 DIAGNOSIS — E119 Type 2 diabetes mellitus without complications: Secondary | ICD-10-CM

## 2012-01-14 DIAGNOSIS — L03119 Cellulitis of unspecified part of limb: Secondary | ICD-10-CM

## 2012-01-14 DIAGNOSIS — L02519 Cutaneous abscess of unspecified hand: Secondary | ICD-10-CM

## 2012-01-14 DIAGNOSIS — E032 Hypothyroidism due to medicaments and other exogenous substances: Secondary | ICD-10-CM

## 2012-01-14 DIAGNOSIS — I959 Hypotension, unspecified: Secondary | ICD-10-CM

## 2012-01-14 LAB — GLUCOSE, CAPILLARY: Glucose-Capillary: 95 mg/dL (ref 70–99)

## 2012-01-14 MED ORDER — AMOXICILLIN-POT CLAVULANATE 875-125 MG PO TABS: 1.0000 | ORAL_TABLET | Freq: Two times a day (BID) | ORAL | Status: DC

## 2012-01-14 NOTE — Care Management Note (Signed)
    Page 1 of 1   01/14/2012     10:26:22 AM   CARE MANAGEMENT NOTE 01/14/2012  Patient:  Deborah Travis, Deborah Travis   Account Number:  [id number]  Date Initiated:  01/14/2012  Documentation initiated by:  Letha Cape  Subjective/Objective Assessment:   dx cellulitis  admit- lives with daughter.     Action/Plan:   Anticipated DC Date:  01/14/2012   Anticipated DC Plan:  HOME/SELF CARE      DC Planning Services  CM consult      Choice offered to / List presented to:             Status of service:  Completed, signed off Medicare Important Message given?   (If response is "NO", the following Medicare IM given date fields will be blank) Date Medicare IM given:   Date Additional Medicare IM given:    Discharge Disposition:  HOME/SELF CARE  Per UR Regulation:  Reviewed for med. necessity/level of care/duration of stay  If discussed at Long Length of Stay Meetings, dates discussed:    Comments:  01/14/12 10:19 Letha Cape RN, BSN [phone] patient lives with daughter, pta independent, patient has medication coverage and transportation.  Patient for dc today, no need identified.

## 2012-01-14 NOTE — Progress Notes (Signed)
Inpatient Diabetes Program Recommendations  AACE/ADA: New Consensus Statement on Inpatient Glycemic Control (2009)  Target Ranges:  Prepandial:   less than 140 mg/dL      Peak postprandial:   less than 180 mg/dL (1-2 hours)      Critically ill patients:  140 - 180 mg/dL   Reason for Visit: Most CBG's below target  Results for OLUWADARA, GORMAN (MRN [id number]) as of 01/14/2012 09:50  Ref. Range 01/13/2012 08:11 01/13/2012 11:43 01/13/2012 12:23 01/13/2012 12:55 01/13/2012 16:41 01/13/2012 21:08 01/14/2012 07:47  Glucose-Capillary Latest Range: 70-99 mg/dL 88 51 (L) 59 (L) 76 95 187 (H) 95    Note: Patient only eating 20%.  Evening dose of 70/30 held with MD order due to previous low CBG at noon after AM dose of 70/30.  Request MD discontinue 70/30 until appetite improves.  Also request MD stop Metformin in effort to improve appetite.  Thank you. Byrl Latin S. Elsie Lincoln, RN, CNS, CDE  [phone])

## 2012-01-14 NOTE — Discharge Summary (Signed)
HOSPITAL DISCHARGE SUMMARY  Deborah Travis  MRN: [id number]  DOB:03-11-1941  Date of Admission: 01/12/2012 Date of Discharge: 01/14/2012         LOS: 2 days   Attending Physician:  Clydia Llano A  Patient's PCP:  Carollee Herter, MD, MD  Consults:  None  Discharge Diagnoses: Present on Admission:  .Cellulitis .Diabetes mellitus .GERD (gastroesophageal reflux disease) .Hypotension .Hypothyroidism   Medication List  As of 01/14/2012 12:16 PM   TAKE these medications         amoxicillin-clavulanate 875-125 MG per tablet   Commonly known as: AUGMENTIN   Take 1 tablet by mouth 2 (two) times daily.      calcium carbonate 1250 MG capsule   Take 1,250 mg by mouth daily.      cholecalciferol 1000 UNITS tablet   Commonly known as: VITAMIN D   Take 1,000 Units by mouth daily.      esomeprazole 40 MG capsule   Commonly known as: NEXIUM   Take 40 mg by mouth daily.      insulin aspart protamine-insulin aspart (70-30) 100 UNIT/ML injection   Commonly known as: NOVOLOG 70/30   Inject 10 Units into the skin 2 (two) times daily with a meal.      levothyroxine 100 MCG tablet   Commonly known as: SYNTHROID, LEVOTHROID   Take 1 tablet (100 mcg total) by mouth daily.      lisinopril 10 MG tablet   Commonly known as: PRINIVIL,ZESTRIL   Take 10 mg by mouth daily.      metFORMIN 1000 MG tablet   Commonly known as: GLUCOPHAGE   Take 1 tablet (1,000 mg total) by mouth 2 (two) times daily with a meal.      mulitivitamin with minerals Tabs   Take 1 tablet by mouth daily.      pravastatin 40 MG tablet   Commonly known as: PRAVACHOL   Take 1 tablet (40 mg total) by mouth daily.             Brief Admission History: The patient is a 71 year old white female with past medical history significant for diabetes and mellitus will arm presents following a cat bite yesterday at about 10:30 AM, to the radial aspect of her left hand and forearm/wrist, and a superficial scratches to  her right forearm by a dog. She states that her cat was scared by a family member's are dog and ended up biting her several times to the areas noted above and she also sustained superficial scratches from the dog as well. She states that overnight she began having some swelling to the right hand/forearm area, and today it was more swollen and much more painful and so she went to the [facility] ED. She denies fevers and she's not had any drainage.  It is noted that both the cat and the dog are up to date on their shots.  At the [facility] ED x-ray of the left hand was done and showed no evidence of bony abnormality. She was started on IV antibiotics and admitted for further evaluation and management.  Hospital Course: Present on Admission:  .Cellulitis .Diabetes mellitus .GERD (gastroesophageal reflux disease) .Hypotension .Hypothyroidism  1. Left upper extremity cellulitis: Patient admitted to the hospital after the cat bite cellulitis. Because of patient diabetic and the infection involving part of the thumb patient admitted for IV antibiotics. Patient started initially on Unasyn. She did receive that for 2 days and her cellulitis improved very much. She was  felt to be appropriate to be discharged home on Augmentin for 7 more days.  2. Diabetes mellitus type 2: Seems very controlled with hemoglobin A1c of 6.3 check done February 2013. Patient is on 10 units of 70/30 mix twice a day and 1 g of metformin twice a day. Please note that while she is in the hospital patient developed hypoglycemia was CBG of 51. Patient denies any hypoglycemic episodes at home. She still-hypoglycemia awareness with sweating, anxiety and shaking. Patient felt comfortable to go home in the same dose of 70/30 mix and metformin.  3. Hypotension: This is was transient with systolic blood pressure of 83. After that patient blood pressure was really ranging between 90 and 110. Patient is hopeful pressure stays like this all the  time. She does not have any symptoms like dizziness or chest pain.  4. Hypothyroidism: Her preadmission Synthroid supplement was continued.   Day of Discharge BP 97/67  Pulse 67  Temp(Src) 98 F (36.7 C) (Oral)  Resp 18  Ht 5\' 3"  (1.6 m)  Wt 50.9 kg (112 lb 3.4 oz)  BMI 19.88 kg/m2  SpO2 95% Physical Exam: GEN: No acute distress, cooperative with exam PSYCH: alert and oriented x4; does not appear anxious does not appear depressed; affect is normal  HEENT: Mucous membranes pink and anicteric;  Mouth: without oral thrush or lesions Eyes: PERRLA; EOM intact;  Neck: no cervical lymphadenopathy nor thyromegaly or carotid bruit; no JVD;  CHEST WALL: No tenderness, symmetrical to breathing bilaterally CHEST: Normal respiration, clear to auscultation bilaterally  HEART: Regular rate and rhythm; no murmurs, rubs or gallops, S1 and S2 heard  BACK: No kyphosis or scoliosis; no CVA tenderness  ABDOMEN:  soft non-tender; no masses, no organomegaly, normal abdominal bowel sounds; no pannus; no intertriginous candida.  EXTREMITIES: No bone or joint deformity; no edema; no ulcerations.  PULSES: 2+ and symmetric, neurovascularity is intact SKIN: Normal hydration no rash or ulceration, no flushing or suspicious lesions  CNS: Cranial nerves 2-12 grossly intact no focal neurologic deficit, coordination is intact gait not tested    Results for orders placed during the hospital encounter of 01/12/12 (from the past 24 hour(s))  GLUCOSE, CAPILLARY     Status: Abnormal   Collection Time   01/13/12 12:23 PM      Component Value Range   Glucose-Capillary 59 (*) 70 - 99 (mg/dL)  GLUCOSE, CAPILLARY     Status: Normal   Collection Time   01/13/12 12:55 PM      Component Value Range   Glucose-Capillary 76  70 - 99 (mg/dL)  GLUCOSE, CAPILLARY     Status: Normal   Collection Time   01/13/12  4:41 PM      Component Value Range   Glucose-Capillary 95  70 - 99 (mg/dL)  GLUCOSE, CAPILLARY     Status:  Abnormal   Collection Time   01/13/12  9:08 PM      Component Value Range   Glucose-Capillary 187 (*) 70 - 99 (mg/dL)   Comment 1 Notify RN    GLUCOSE, CAPILLARY     Status: Normal   Collection Time   01/14/12  7:47 AM      Component Value Range   Glucose-Capillary 95  70 - 99 (mg/dL)    Disposition: Home   Follow-up Appts: Discharge Orders    Future Appointments: Provider: Department: Dept Phone: Center:   02/05/2012 8:45 AM Ronnald Nian, MD [facility] [phone] [facility]     Future  Orders Please Complete By Expires   Diet Carb Modified      Increase activity slowly         Follow-up Information    Follow up with Carollee Herter, MD in 2 weeks.   Contact information:   [address] [place] [address] [phone]          I spent 40 minutes completing paperwork and coordinating discharge efforts.  SignedClydia Llano A 01/14/2012, 12:16 PM

## 2012-01-14 NOTE — Progress Notes (Signed)
Patient discharged home with daughter. Discharge instructions given. Patient verbalized understanding. PRIDDY, Cleofas Hudgins

## 2012-01-15 ENCOUNTER — Inpatient Hospital Stay (HOSPITAL_COMMUNITY)
Admission: EM | Admit: 2012-01-15 | Discharge: 2012-01-17 | DRG: 603 | Disposition: A | Discharge status: Home / Self Care | Payer: Medicare Other | Attending: Internal Medicine | Admitting: Internal Medicine

## 2012-01-15 ENCOUNTER — Encounter (HOSPITAL_COMMUNITY): Payer: Self-pay | Admitting: Emergency Medicine

## 2012-01-15 DIAGNOSIS — L03114 Cellulitis of left upper limb: Secondary | ICD-10-CM | POA: Diagnosis present

## 2012-01-15 DIAGNOSIS — E119 Type 2 diabetes mellitus without complications: Secondary | ICD-10-CM

## 2012-01-15 DIAGNOSIS — I152 Hypertension secondary to endocrine disorders: Secondary | ICD-10-CM | POA: Insufficient documentation

## 2012-01-15 DIAGNOSIS — E1159 Type 2 diabetes mellitus with other circulatory complications: Secondary | ICD-10-CM | POA: Insufficient documentation

## 2012-01-15 DIAGNOSIS — E039 Hypothyroidism, unspecified: Secondary | ICD-10-CM | POA: Diagnosis present

## 2012-01-15 DIAGNOSIS — E118 Type 2 diabetes mellitus with unspecified complications: Secondary | ICD-10-CM | POA: Diagnosis present

## 2012-01-15 DIAGNOSIS — I10 Essential (primary) hypertension: Secondary | ICD-10-CM | POA: Insufficient documentation

## 2012-01-15 DIAGNOSIS — I959 Hypotension, unspecified: Secondary | ICD-10-CM | POA: Diagnosis present

## 2012-01-15 DIAGNOSIS — L0390 Cellulitis, unspecified: Secondary | ICD-10-CM

## 2012-01-15 DIAGNOSIS — IMO0001 Reserved for inherently not codable concepts without codable children: Secondary | ICD-10-CM

## 2012-01-15 DIAGNOSIS — M7989 Other specified soft tissue disorders: Secondary | ICD-10-CM

## 2012-01-15 DIAGNOSIS — K8681 Exocrine pancreatic insufficiency: Secondary | ICD-10-CM | POA: Diagnosis present

## 2012-01-15 DIAGNOSIS — E1169 Type 2 diabetes mellitus with other specified complication: Secondary | ICD-10-CM | POA: Insufficient documentation

## 2012-01-15 DIAGNOSIS — E785 Hyperlipidemia, unspecified: Secondary | ICD-10-CM | POA: Insufficient documentation

## 2012-01-15 LAB — DIFFERENTIAL
Basophils Absolute: 0 10*3/uL (ref 0.0–0.1)
Basophils Absolute: 0 10*3/uL (ref 0.0–0.1)
Basophils Relative: 0 % (ref 0–1)
Eosinophils Relative: 3 % (ref 0–5)
Lymphocytes Relative: 24 % (ref 12–46)
Monocytes Relative: 10 % (ref 3–12)
Neutro Abs: 4.9 10*3/uL (ref 1.7–7.7)
Neutro Abs: 3.1 10*3/uL (ref 1.7–7.7)
Neutrophils Relative %: 55 % (ref 43–77)

## 2012-01-15 LAB — CBC
Hemoglobin: 11 g/dL — ABNORMAL LOW (ref 12.0–15.0)
MCH: 30.9 pg (ref 26.0–34.0)
MCHC: 33.4 g/dL (ref 30.0–36.0)
MCV: 93 fL (ref 78.0–100.0)
Platelets: 203 10*3/uL (ref 150–400)
Platelets: 203 10*3/uL (ref 150–400)
RDW: 13.7 % (ref 11.5–15.5)
WBC: 7.5 10*3/uL (ref 4.0–10.5)

## 2012-01-15 LAB — COMPREHENSIVE METABOLIC PANEL
ALT: 16 U/L (ref 0–35)
Calcium: 8.5 mg/dL (ref 8.4–10.5)
GFR calc Af Amer: 72 mL/min — ABNORMAL LOW (ref >90)
Glucose, Bld: 140 mg/dL — ABNORMAL HIGH (ref 70–99)
Sodium: 141 mEq/L (ref 135–145)
Total Protein: 5.6 g/dL — ABNORMAL LOW (ref 6.0–8.3)

## 2012-01-15 LAB — PROTIME-INR
INR: 1.12 (ref 0.00–1.49)
Prothrombin Time: 14.6 seconds (ref 11.6–15.2)

## 2012-01-15 LAB — APTT: aPTT: 37 seconds (ref 24–37)

## 2012-01-15 LAB — HEMOGLOBIN A1C: Mean Plasma Glucose: 148 mg/dL — ABNORMAL HIGH (ref <117)

## 2012-01-15 LAB — SEDIMENTATION RATE: Sed Rate: 19 mm/hr (ref 0–22)

## 2012-01-15 MED ORDER — ENOXAPARIN SODIUM 40 MG/0.4ML ~~LOC~~ SOLN
40.0000 mg | SUBCUTANEOUS | Status: DC
  Administered 2012-01-15: 40 mg via SUBCUTANEOUS
  Filled 2012-01-15 (×3): qty 0.4

## 2012-01-15 MED ORDER — SODIUM CHLORIDE 0.9 % IV SOLN
INTRAVENOUS | Status: AC
  Administered 2012-01-15: 12:00:00 via INTRAVENOUS

## 2012-01-15 MED ORDER — MORPHINE SULFATE 2 MG/ML IJ SOLN
1.0000 mg | INTRAMUSCULAR | Status: DC | PRN
  Administered 2012-01-15 – 2012-01-17 (×7): 1 mg via INTRAVENOUS
  Filled 2012-01-15 (×6): qty 1

## 2012-01-15 MED ORDER — PANTOPRAZOLE SODIUM 40 MG PO TBEC
40.0000 mg | DELAYED_RELEASE_TABLET | Freq: Every day | ORAL | Status: DC
  Administered 2012-01-15 – 2012-01-17 (×3): 40 mg via ORAL
  Filled 2012-01-15 (×2): qty 1

## 2012-01-15 MED ORDER — LISINOPRIL 10 MG PO TABS
10.0000 mg | ORAL_TABLET | Freq: Every day | ORAL | Status: DC
  Administered 2012-01-15: 10 mg via ORAL
  Filled 2012-01-15 (×2): qty 1

## 2012-01-15 MED ORDER — DIPHENHYDRAMINE HCL 50 MG/ML IJ SOLN
12.5000 mg | Freq: Once | INTRAMUSCULAR | Status: AC
  Administered 2012-01-15: 12.5 mg via INTRAVENOUS

## 2012-01-15 MED ORDER — ADULT MULTIVITAMIN W/MINERALS CH
1.0000 | ORAL_TABLET | Freq: Every day | ORAL | Status: DC
  Administered 2012-01-15 – 2012-01-17 (×3): 1 via ORAL
  Filled 2012-01-15 (×3): qty 1

## 2012-01-15 MED ORDER — HYDROCODONE-ACETAMINOPHEN 5-325 MG PO TABS
1.0000 | ORAL_TABLET | ORAL | Status: DC | PRN
  Administered 2012-01-15 – 2012-01-16 (×2): 2 via ORAL
  Filled 2012-01-15 (×3): qty 2

## 2012-01-15 MED ORDER — CALCIUM CARBONATE 1250 (500 CA) MG PO TABS
1250.0000 mg | ORAL_TABLET | Freq: Every day | ORAL | Status: DC
  Administered 2012-01-15 – 2012-01-17 (×3): 1250 mg via ORAL
  Filled 2012-01-15 (×3): qty 1

## 2012-01-15 MED ORDER — INSULIN ASPART PROT & ASPART (70-30 MIX) 100 UNIT/ML ~~LOC~~ SUSP
10.0000 [IU] | Freq: Two times a day (BID) | SUBCUTANEOUS | Status: DC
  Administered 2012-01-15: 10 [IU] via SUBCUTANEOUS
  Filled 2012-01-15: qty 3

## 2012-01-15 MED ORDER — MORPHINE SULFATE 4 MG/ML IJ SOLN
4.0000 mg | Freq: Once | INTRAMUSCULAR | Status: AC
  Administered 2012-01-15: 4 mg via INTRAVENOUS
  Filled 2012-01-15: qty 1

## 2012-01-15 MED ORDER — SODIUM CHLORIDE 0.9 % IV SOLN
1.5000 g | Freq: Four times a day (QID) | INTRAVENOUS | Status: DC
  Administered 2012-01-15 – 2012-01-17 (×8): 1.5 g via INTRAVENOUS
  Filled 2012-01-15 (×10): qty 1.5

## 2012-01-15 MED ORDER — DIPHENHYDRAMINE HCL 50 MG/ML IJ SOLN
12.5000 mg | Freq: Once | INTRAMUSCULAR | Status: DC
  Filled 2012-01-15: qty 1

## 2012-01-15 MED ORDER — ONDANSETRON HCL 4 MG PO TABS: 4.0000 mg | ORAL_TABLET | Freq: Four times a day (QID) | ORAL | Status: DC | PRN

## 2012-01-15 MED ORDER — DIPHENHYDRAMINE HCL 50 MG/ML IJ SOLN
12.5000 mg | Freq: Four times a day (QID) | INTRAMUSCULAR | Status: DC | PRN
  Administered 2012-01-15 (×2): 12.5 mg via INTRAVENOUS
  Filled 2012-01-15 (×2): qty 1

## 2012-01-15 MED ORDER — MORPHINE SULFATE 2 MG/ML IJ SOLN
INTRAMUSCULAR | Status: AC
  Filled 2012-01-15: qty 1

## 2012-01-15 MED ORDER — SIMVASTATIN 20 MG PO TABS
20.0000 mg | ORAL_TABLET | Freq: Every day | ORAL | Status: DC
  Administered 2012-01-15 – 2012-01-16 (×2): 20 mg via ORAL
  Filled 2012-01-15 (×3): qty 1

## 2012-01-15 MED ORDER — VITAMIN D3 25 MCG (1000 UNIT) PO TABS
1000.0000 [IU] | ORAL_TABLET | Freq: Every day | ORAL | Status: DC
  Administered 2012-01-15 – 2012-01-17 (×3): 1000 [IU] via ORAL
  Filled 2012-01-15 (×3): qty 1

## 2012-01-15 MED ORDER — ONDANSETRON HCL 4 MG/2ML IJ SOLN: 4.0000 mg | Freq: Four times a day (QID) | INTRAMUSCULAR | Status: DC | PRN

## 2012-01-15 MED ORDER — LEVOTHYROXINE SODIUM 100 MCG PO TABS
100.0000 ug | ORAL_TABLET | Freq: Every day | ORAL | Status: DC
  Administered 2012-01-15 – 2012-01-17 (×3): 100 ug via ORAL
  Filled 2012-01-15 (×3): qty 1

## 2012-01-15 MED ORDER — SODIUM CHLORIDE 0.9 % IV SOLN
3.0000 g | Freq: Once | INTRAVENOUS | Status: AC
  Administered 2012-01-15: 3 g via INTRAVENOUS
  Filled 2012-01-15: qty 3

## 2012-01-15 NOTE — Progress Notes (Signed)
UR completed 

## 2012-01-15 NOTE — H&P (Signed)
PCP:  Carollee Herter, MD, MD   DOA:  01/15/2012  1:19 AM  Chief Complaint:  Left hand swelling  HPI: Pleasant 71 year old female with history of HTN, DM, dyslipidemia who presented to ED with worsening swelling and erythema and tenderness over the left posterior aspect of the hand. Pain is 8/10 in intensity, non radiating and it did not get better with PO antibiotic. Patient came to ED 1 day ago and was sent home with PO antibiotics but has noticed that the swelling was getting worse and she has returned to ED for IV antibiotics. Patient reported subjective fever and chills. NO other complaints of shortness of breath, cough, chest pain. No abdominal pain, no nausea or vomiting.  Allergies: Allergies  Allergen Reactions  . Codeine Itching  . Morphine And Related Itching    Prior to Admission medications   Medication Sig Start Date End Date Taking? Authorizing Provider  amoxicillin-clavulanate (AUGMENTIN) 875-125 MG per tablet Take 1 tablet by mouth 2 (two) times daily. 01/14/12 01/24/12 Yes Clydia Llano, MD  calcium carbonate 1250 MG capsule Take 1,250 mg by mouth daily.    Yes Historical Provider, MD  cholecalciferol (VITAMIN D) 1000 UNITS tablet Take 1,000 Units by mouth daily.     Yes Historical Provider, MD  esomeprazole (NEXIUM) 40 MG capsule Take 40 mg by mouth daily.   Yes Historical Provider, MD  insulin aspart protamine-insulin aspart (NOVOLOG 70/30) (70-30) 100 UNIT/ML injection Inject 10 Units into the skin 2 (two) times daily with a meal. 10/09/11  Yes Ronnald Nian, MD  levothyroxine (SYNTHROID, LEVOTHROID) 100 MCG tablet Take 1 tablet (100 mcg total) by mouth daily. 10/09/11  Yes Ronnald Nian, MD  lisinopril (PRINIVIL,ZESTRIL) 10 MG tablet Take 10 mg by mouth daily.   Yes Historical Provider, MD  metFORMIN (GLUCOPHAGE) 1000 MG tablet Take 1 tablet (1,000 mg total) by mouth 2 (two) times daily with a meal. 10/09/11 10/08/12 Yes Ronnald Nian, MD  Multiple Vitamin  (MULITIVITAMIN WITH MINERALS) TABS Take 1 tablet by mouth daily.   Yes Historical Provider, MD  pravastatin (PRAVACHOL) 40 MG tablet Take 1 tablet (40 mg total) by mouth daily. 10/09/11  Yes Ronnald Nian, MD    Past Medical History  Diagnosis Date  . Hypertension   . Diabetes mellitus   . GERD (gastroesophageal reflux disease)   . Vitamin d deficiency   . Dyslipidemia   . Chronic kidney disease     RENAL STONE  . Duodenal ulcer   . Thyroid disease     HYPOTHYROID  . Chronic pancreatitis   . Osteopenia     Past Surgical History  Procedure Date  . Cholecystectomy   . Whipple procedure 1998  . Appendectomy   . Tonsilectomy, adenoidectomy, bilateral myringotomy and tubes     Social History:  reports that she has been smoking.  She has never used smokeless tobacco. She reports that she does not drink alcohol or use illicit drugs.  Family History  Problem Relation Age of Onset  . Stroke Mother   . Heart disease Father   . Diabetes Sister     Review of Systems:  Constitutional: Denies fever, chills, diaphoresis, appetite change and fatigue.  HEENT: Denies photophobia, eye pain, redness, hearing loss, ear pain, congestion, sore throat, rhinorrhea, sneezing, mouth sores, trouble swallowing, neck pain, neck stiffness and tinnitus.   Respiratory: Denies SOB, DOE, cough, chest tightness,  and wheezing.   Cardiovascular: Denies chest pain, palpitations and leg swelling.  Gastrointestinal: Denies nausea, vomiting, abdominal pain, diarrhea, constipation, blood in stool and abdominal distention.  Genitourinary: Denies dysuria, urgency, frequency, hematuria, flank pain and difficulty urinating.  Musculoskeletal: Denies myalgias, back pain, joint swelling, arthralgias and gait problem.  Skin: per hpi.  Neurological: Denies dizziness, seizures, syncope, weakness, light-headedness, numbness and headaches.  Hematological: Denies adenopathy. Easy bruising, personal or family bleeding  history  Psychiatric/Behavioral: Denies suicidal ideation, mood changes, confusion, nervousness, sleep disturbance and agitation   Physical Exam:  Filed Vitals:   01/15/12 0536 01/15/12 0801 01/15/12 0909 01/15/12 1353  BP: 124/78 117/70 116/68 101/63  Pulse: 68 65 62 68  Temp: 98.7 F (37.1 C)  98.5 F (36.9 C) 98.9 F (37.2 C)  TempSrc: Oral  Oral Oral  Resp: 18 18 20 20   Height:   5\' 3"  (1.6 m)   Weight:   52.5 kg (115 lb 11.9 oz)   SpO2: 97% 96% 96% 100%    Constitutional: Vital signs reviewed.  Patient is in no acute distress and cooperative with exam. Alert and oriented x3.  Head: Normocephalic and atraumatic Ear: TM normal bilaterally Mouth: no erythema or exudates, MMM Eyes: PERRL, EOMI, conjunctivae normal, No scleral icterus.  Neck: Supple, Trachea midline normal ROM, No JVD, mass, thyromegaly, or carotid bruit present.  Cardiovascular: RRR, S1 normal, S2 normal, no MRG, pulses symmetric and intact bilaterally Pulmonary/Chest: CTAB, no wheezes, rales, or rhonchi Abdominal: Soft. Non-tender, non-distended, bowel sounds are normal, no masses, organomegaly, or guarding present.  GU: no CVA tenderness Musculoskeletal: No joint deformities, erythema, or stiffness, ROM full and no nontender Ext: swelling and erythema on left hand; positive radial pulses Hematology: no cervical, inginal, or axillary adenopathy.  Neurological: A&O x3, Strenght is normal and symmetric bilaterally, cranial nerve II-XII are grossly intact, no focal motor deficit, sensory intact to light touch bilaterally.  Skin: Warm, dry and intact. No rash, cyanosis, or clubbing.  Psychiatric: Normal mood and affect. speech and behavior is normal. Judgment and thought content normal. Cognition and memory are normal.   Labs on Admission:  CBC     Status: Abnormal   Collection Time   01/15/12  4:45 AM      Component Value Range Comment   WBC 7.5  4.0 - 10.5 (K/uL)    RBC 3.71 (*) 3.87 - 5.11 (MIL/uL)     Hemoglobin 11.5 (*) 12.0 - 15.0 (g/dL)    HCT 46.9 (*) 62.9 - 46.0 (%)    MCV 93.0  78.0 - 100.0 (fL)    MCH 31.0  26.0 - 34.0 (pg)    MCHC 33.3  30.0 - 36.0 (g/dL)    RDW 52.8  41.3 - 24.4 (%)    Platelets 203  150 - 400 (K/uL)   DIFFERENTIAL     Status: Normal   Collection Time   01/15/12  4:45 AM      Component Value Range Comment   Neutrophils Relative 65  43 - 77 (%)    Neutro Abs 4.9  1.7 - 7.7 (K/uL)    Lymphocytes Relative 24  12 - 46 (%)    Lymphs Abs 1.8  0.7 - 4.0 (K/uL)    Monocytes Relative 8  3 - 12 (%)    Monocytes Absolute 0.6  0.1 - 1.0 (K/uL)    Eosinophils Relative 3  0 - 5 (%)    Eosinophils Absolute 0.2  0.0 - 0.7 (K/uL)    Basophils Relative 0  0 - 1 (%)    Basophils Absolute  0.0  0.0 - 0.1 (K/uL)   SEDIMENTATION RATE     Status: Normal   Collection Time   01/15/12  4:45 AM      Component Value Range Comment   Sed Rate 19  0 - 22 (mm/hr)   GLUCOSE, CAPILLARY     Status: Normal   Collection Time   01/15/12  8:35 AM      Component Value Range Comment   Glucose-Capillary 82  70 - 99 (mg/dL)   CBC     Status: Abnormal   Collection Time   01/15/12 12:30 PM      Component Value Range Comment   WBC 5.7  4.0 - 10.5 (K/uL)    RBC 3.56 (*) 3.87 - 5.11 (MIL/uL)    Hemoglobin 11.0 (*) 12.0 - 15.0 (g/dL)    HCT 45.4 (*) 09.8 - 46.0 (%)    MCV 92.4  78.0 - 100.0 (fL)    MCH 30.9  26.0 - 34.0 (pg)    MCHC 33.4  30.0 - 36.0 (g/dL)    RDW 11.9  14.7 - 82.9 (%)    Platelets 203  150 - 400 (K/uL)   COMPREHENSIVE METABOLIC PANEL     Status: Abnormal   Collection Time   01/15/12 12:30 PM      Component Value Range Comment   Sodium 141  135 - 145 (mEq/L)    Potassium 3.7  3.5 - 5.1 (mEq/L)    Chloride 111  96 - 112 (mEq/L)    CO2 20  19 - 32 (mEq/L)    Glucose, Bld 140 (*) 70 - 99 (mg/dL)    BUN 8  6 - 23 (mg/dL)    Creatinine, Ser 5.62  0.50 - 1.10 (mg/dL)    Calcium 8.5  8.4 - 10.5 (mg/dL)    Total Protein 5.6 (*) 6.0 - 8.3 (g/dL)    Albumin 2.7 (*) 3.5 - 5.2  (g/dL)    AST 17  0 - 37 (U/L)    ALT 16  0 - 35 (U/L)    Alkaline Phosphatase 85  39 - 117 (U/L)    Total Bilirubin 0.3  0.3 - 1.2 (mg/dL)    GFR calc non Af Amer 63 (*) >90 (mL/min)    GFR calc Af Amer 72 (*) >90 (mL/min)   MAGNESIUM     Status: Normal   Collection Time   01/15/12 12:30 PM      Component Value Range Comment   Magnesium 1.5  1.5 - 2.5 (mg/dL)   PHOSPHORUS     Status: Normal   Collection Time   01/15/12 12:30 PM      Component Value Range Comment   Phosphorus 3.1  2.3 - 4.6 (mg/dL)   DIFFERENTIAL     Status: Normal   Collection Time   01/15/12 12:30 PM      Component Value Range Comment   Neutrophils Relative 55  43 - 77 (%)    Neutro Abs 3.1  1.7 - 7.7 (K/uL)    Lymphocytes Relative 32  12 - 46 (%)    Lymphs Abs 1.9  0.7 - 4.0 (K/uL)    Monocytes Relative 10  3 - 12 (%)    Monocytes Absolute 0.6  0.1 - 1.0 (K/uL)    Eosinophils Relative 3  0 - 5 (%)    Eosinophils Absolute 0.2  0.0 - 0.7 (K/uL)    Basophils Relative 0  0 - 1 (%)    Basophils Absolute 0.0  0.0 - 0.1 (K/uL)   APTT     Status: Normal   Collection Time   01/15/12 12:30 PM      Component Value Range Comment   aPTT 37  24 - 37 (seconds)   PROTIME-INR     Status: Normal   Collection Time   01/15/12 12:30 PM      Component Value Range Comment   Prothrombin Time 14.6  11.6 - 15.2 (seconds)    INR 1.12  0.00 - 1.49       Assessment/Plan  Active Problems:  Cat bite - unasyn started on admission - patient reports swelling already slowly improving - provide benadryl PRN itching  Diabetes mellitus - continue home medication regimen which includes insulin 10 units 2 x day - hold metformin in light of the infection  HTN - continue home medication regimen which includes lisinopril  Hypothyroidism - please continue levothyroxine  DVT Prophylaxis - lovenox sub Q  Code Status - full code  Disposition - anticipate D/C in am  Education  - test results and diagnostic studies were  discussed with patient and pt's family who was present at the bedside - patient and family have verbalized the understanding - questions were answered at the bedside and contact information was provided for additional questions or concerns  Time Spent on Admission: Over 30 minutes  Mistee Soliman 01/15/2012, 4:22 PM  [facility] Pager # [phone] Main Office # [phone]

## 2012-01-15 NOTE — ED Provider Notes (Signed)
History     CSN: [id number]  Arrival date & time 01/14/12  2320   First MD Initiated Contact with Patient 01/15/12 [phone]      Chief Complaint  Patient presents with  . Cellulitis  . Animal Bite    (Consider location/radiation/quality/duration/timing/severity/associated sxs/prior treatment) HPI 71 year old female presents to the emergency department with worsening cellulitis secondary to cat bite. Patient reports she was bitten by her cat on 87. Patient was initially seen at [facility], and [facility]. Patient was discharged yesterday and started on Augmentin. This evening she noticed worsening pain, redness and swelling to the area. Patient returned to the ER. Patient has history of diabetes. She denies any fever. Animals were immunized.   Past Medical History  Diagnosis Date  . Hypertension   . Diabetes mellitus   . GERD (gastroesophageal reflux disease)   . Vitamin d deficiency   . Dyslipidemia   . Chronic kidney disease     RENAL STONE  . Duodenal ulcer   . Thyroid disease     HYPOTHYROID  . Chronic pancreatitis   . Osteopenia     Past Surgical History  Procedure Date  . Cholecystectomy   . Whipple procedure 1998  . Appendectomy   . Tonsilectomy, adenoidectomy, bilateral myringotomy and tubes     Family History  Problem Relation Age of Onset  . Stroke Mother   . Heart disease Father   . Diabetes Sister     History  Substance Use Topics  . Smoking status: Current Everyday Smoker -- 0.0 packs/day  . Smokeless tobacco: Never Used   Comment: 3 cigarettes a day  . Alcohol Use: No    OB History    Grav Para Term Preterm Abortions TAB SAB Ect Mult Living                  Review of Systems  All other systems reviewed and are negative.    Allergies  Codeine and Morphine and related  Home Medications   Current Outpatient Rx  Name Route Sig Dispense Refill  . AMOXICILLIN-POT CLAVULANATE 875-125 MG PO TABS Oral  Take 1 tablet by mouth 2 (two) times daily. 14 tablet 0  . CALCIUM CARBONATE 1250 MG PO CAPS Oral Take 1,250 mg by mouth daily.     Marland Kitchen VITAMIN D 1000 UNITS PO TABS Oral Take 1,000 Units by mouth daily.      Marland Kitchen ESOMEPRAZOLE MAGNESIUM 40 MG PO CPDR Oral Take 40 mg by mouth daily.    . INSULIN ASPART PROT & ASPART (70-30) 100 UNIT/ML [place] SUSP Subcutaneous Inject 10 Units into the skin 2 (two) times daily with a meal. 30 mL 1  . LEVOTHYROXINE SODIUM 100 MCG PO TABS Oral Take 1 tablet (100 mcg total) by mouth daily. 90 tablet 1  . LISINOPRIL 10 MG PO TABS Oral Take 10 mg by mouth daily.    Marland Kitchen METFORMIN HCL 1000 MG PO TABS Oral Take 1 tablet (1,000 mg total) by mouth 2 (two) times daily with a meal. 180 tablet 1  . ADULT MULTIVITAMIN W/MINERALS CH Oral Take 1 tablet by mouth daily.    Marland Kitchen PRAVASTATIN SODIUM 40 MG PO TABS Oral Take 1 tablet (40 mg total) by mouth daily. 90 tablet 1    BP 124/78  Pulse 68  Temp(Src) 98.7 F (37.1 C) (Oral)  Resp 18  SpO2 97%  Physical Exam  Nursing note and vitals reviewed. Constitutional: She is oriented to  person, place, and time. She appears well-developed and well-nourished.  HENT:  Head: Normocephalic and atraumatic.  Nose: Nose normal.  Mouth/Throat: Oropharynx is clear and moist.  Eyes: Conjunctivae and EOM are normal. Pupils are equal, round, and reactive to light.  Neck: Normal range of motion. Neck supple. No JVD present. No tracheal deviation present. No thyromegaly present.  Cardiovascular: Normal rate, regular rhythm, normal heart sounds and intact distal pulses.  Exam reveals no gallop and no friction rub.   No murmur heard. Pulmonary/Chest: Effort normal and breath sounds normal. No stridor. No respiratory distress. She has no wheezes. She has no rales. She exhibits no tenderness.  Abdominal: Soft. Bowel sounds are normal. She exhibits no distension and no mass. There is no tenderness. There is no rebound and no guarding.  Musculoskeletal: Normal  range of motion. She exhibits tenderness. She exhibits no edema.       Patient with area of edema and erythema to left wrist. Pain with range of motion at the wrist, no effusion, no crepitus noted. No pain over tendon sheaths  Lymphadenopathy:    She has no cervical adenopathy.  Neurological: She is oriented to person, place, and time. She exhibits normal muscle tone. Coordination normal.  Skin: Skin is dry. No rash noted. No erythema. No pallor.  Psychiatric: She has a normal mood and affect. Her behavior is normal. Judgment and thought content normal.    ED Course  Procedures (including critical care time)  Labs Reviewed  CBC - Abnormal; Notable for the following:    RBC 3.71 (*)    Hemoglobin 11.5 (*)    HCT 34.5 (*)    All other components within normal limits  DIFFERENTIAL  SEDIMENTATION RATE   No results found.   1. Cellulitis       MDM  71 year old female with recent cellulitis secondary Bite. Patient returns with persistent cellulitis now worsening. Patient reevaluated this morning, now has streaking up the arm. Will discuss with hospitalist for readmission to the hospital        Olivia Mackie, MD 01/15/12 [phone]

## 2012-01-15 NOTE — ED Notes (Addendum)
Pt was bitten by a cat on 5/16 on her L arm and was seen at [facility] and admitted to [facility]. She was released this morning and states that it is beginning to swell and become red again. Pt is worried because she is diabetic. Took both Augmentin tablets today as prescribed. Was also bitten by dog on R arm.

## 2012-01-15 NOTE — Progress Notes (Signed)
ANTIBIOTIC CONSULT NOTE - INITIAL  Pharmacy Consult for Unasyn Indication: cellulitis  Allergies  Allergen Reactions  . Codeine Itching  . Morphine And Related Itching    Patient Measurements: Height: 5\' 3"  (160 cm) Weight: 115 lb 11.9 oz (52.5 kg) IBW/kg (Calculated) : 52.4   Vital Signs: Temp: 98.5 F (36.9 C) (05/21 0909) Temp src: Oral (05/21 0909) BP: 116/68 mmHg (05/21 0909) Pulse Rate: 62  (05/21 0909) Intake/Output from previous day:   Intake/Output from this shift:    Labs:  Basename 01/15/12 0445 01/13/12 0515 01/12/12 2100 01/12/12 1223  WBC 7.5 9.8 -- 9.0  HGB 11.5* 11.4* -- 12.0  PLT 203 198 -- 209  LABCREA -- -- -- --  CREATININE -- -- 0.91 1.10   Estimated Creatinine Clearance: 48.9 ml/min (by C-G formula based on Cr of 0.91). No results found for this basename: VANCOTROUGH:2,VANCOPEAK:2,VANCORANDOM:2,GENTTROUGH:2,GENTPEAK:2,GENTRANDOM:2,TOBRATROUGH:2,TOBRAPEAK:2,TOBRARND:2,AMIKACINPEAK:2,AMIKACINTROU:2,AMIKACIN:2, in the last 72 hours   Microbiology: Recent Results (from the past 720 hour(s))  CULTURE, BLOOD (ROUTINE X 2)     Status: Normal (Preliminary result)   Collection Time   01/12/12  8:40 PM      Component Value Range Status Comment   Specimen Description BLOOD LEFT HAND   Final    Special Requests BOTTLES DRAWN AEROBIC AND ANAEROBIC 10CC EA   Final    Culture  Setup Time [id number]   Final    Culture     Final    Value:        BLOOD CULTURE RECEIVED NO GROWTH TO DATE CULTURE WILL BE HELD FOR 5 DAYS BEFORE ISSUING A FINAL NEGATIVE REPORT   Report Status PENDING   Incomplete   CULTURE, BLOOD (ROUTINE X 2)     Status: Normal (Preliminary result)   Collection Time   01/12/12  8:50 PM      Component Value Range Status Comment   Specimen Description BLOOD LEFT ARM   Final    Special Requests BOTTLES DRAWN AEROBIC ONLY 10CC   Final    Culture  Setup Time [id number]   Final    Culture     Final    Value:        BLOOD CULTURE RECEIVED NO  GROWTH TO DATE CULTURE WILL BE HELD FOR 5 DAYS BEFORE ISSUING A FINAL NEGATIVE REPORT   Report Status PENDING   Incomplete     Medical History: Past Medical History  Diagnosis Date  . Hypertension   . Diabetes mellitus   . GERD (gastroesophageal reflux disease)   . Vitamin d deficiency   . Dyslipidemia   . Chronic kidney disease     RENAL STONE  . Duodenal ulcer   . Thyroid disease     HYPOTHYROID  . Chronic pancreatitis   . Osteopenia     Medications:  Scheduled:    . ampicillin-sulbactam (UNASYN) IV  3 g Intravenous Once  . calcium carbonate  1,250 mg Oral Daily  . cholecalciferol  1,000 Units Oral Daily  . diphenhydrAMINE  12.5 mg Intravenous Once  . enoxaparin  40 mg Subcutaneous Q24H  . insulin aspart protamine-insulin aspart  10 Units Subcutaneous BID WC  . levothyroxine  100 mcg Oral Daily  . lisinopril  10 mg Oral Daily  .  morphine injection  4 mg Intravenous Once  . mulitivitamin with minerals  1 tablet Oral Daily  . pantoprazole  40 mg Oral Daily  . simvastatin  20 mg Oral q1800  . DISCONTD: diphenhydrAMINE  12.5 mg Intramuscular Once  Infusions:    . sodium chloride     Assessment: 71 yo female with worsening cellulitis secondary to cat bite on 5/16 to restart Unasyn per pharmacy dosing  Goal of Therapy:  Unasyn adjustment per renal function  Plan:  1. Unasyn 1.5mg  IV q6 2. Stable renal function 3. Will sign off  Hessie Knows, PharmD, BCPS Pager [phone] 01/15/2012 11:29 AM

## 2012-01-16 DIAGNOSIS — L03114 Cellulitis of left upper limb: Secondary | ICD-10-CM | POA: Diagnosis present

## 2012-01-16 DIAGNOSIS — L03119 Cellulitis of unspecified part of limb: Secondary | ICD-10-CM

## 2012-01-16 DIAGNOSIS — E1165 Type 2 diabetes mellitus with hyperglycemia: Secondary | ICD-10-CM

## 2012-01-16 DIAGNOSIS — L02519 Cutaneous abscess of unspecified hand: Secondary | ICD-10-CM

## 2012-01-16 DIAGNOSIS — I10 Essential (primary) hypertension: Secondary | ICD-10-CM

## 2012-01-16 DIAGNOSIS — IMO0001 Reserved for inherently not codable concepts without codable children: Secondary | ICD-10-CM

## 2012-01-16 LAB — COMPREHENSIVE METABOLIC PANEL
BUN: 11 mg/dL (ref 6–23)
CO2: 21 mEq/L (ref 19–32)
Calcium: 8.2 mg/dL — ABNORMAL LOW (ref 8.4–10.5)
Creatinine, Ser: 0.93 mg/dL (ref 0.50–1.10)
GFR calc Af Amer: 72 mL/min — ABNORMAL LOW (ref >90)
GFR calc non Af Amer: 62 mL/min — ABNORMAL LOW (ref >90)
Glucose, Bld: 231 mg/dL — ABNORMAL HIGH (ref 70–99)

## 2012-01-16 LAB — GLUCOSE, CAPILLARY
Glucose-Capillary: 49 mg/dL — ABNORMAL LOW (ref 70–99)
Glucose-Capillary: 90 mg/dL (ref 70–99)
Glucose-Capillary: 121 mg/dL — ABNORMAL HIGH (ref 70–99)

## 2012-01-16 LAB — CBC
Hemoglobin: 10.5 g/dL — ABNORMAL LOW (ref 12.0–15.0)
Platelets: 198 10*3/uL (ref 150–400)
RBC: 3.35 MIL/uL — ABNORMAL LOW (ref 3.87–5.11)
WBC: 6.3 10*3/uL (ref 4.0–10.5)

## 2012-01-16 MED ORDER — INSULIN ASPART PROT & ASPART (70-30 MIX) 100 UNIT/ML ~~LOC~~ SUSP
5.0000 [IU] | Freq: Two times a day (BID) | SUBCUTANEOUS | Status: DC
  Administered 2012-01-16: 5 [IU] via SUBCUTANEOUS
  Filled 2012-01-16: qty 10

## 2012-01-16 NOTE — Progress Notes (Signed)
Subjective: Patient states does not feel too well this morning.  Objective: Vital signs in last 24 hours: Filed Vitals:   01/15/12 1353 01/15/12 2105 01/16/12 0530 01/16/12 1014  BP: 101/63 107/65 96/59 111/68  Pulse: 68 60 61 54  Temp: 98.9 F (37.2 C) 97.8 F (36.6 C) 97.9 F (36.6 C)   TempSrc: Oral Oral Oral   Resp: 20 20 20    Height:      Weight:      SpO2: 100% 100% 100%     Intake/Output Summary (Last 24 hours) at 01/16/12 1025 Last data filed at 01/16/12 0900  Gross per 24 hour  Intake   1985 ml  Output    501 ml  Net   1484 ml    Weight change:   General: Alert, awake, oriented x3, in no acute distress. HEENT: No bruits, no goiter. Heart: Regular rate and rhythm, without murmurs, rubs, gallops. Lungs: Clear to auscultation bilaterally. Abdomen: Soft, nontender, nondistended, positive bowel sounds. Extremities: No clubbing cyanosis or edema with positive pedal pulses. Left hand with decreased swelling and erythema. Neuro: Grossly intact, nonfocal.    Lab Results:  Basename 01/16/12 0455 01/15/12 1230  NA 139 141  K 3.8 3.7  CL 109 111  CO2 21 20  GLUCOSE 231* 140*  BUN 11 8  CREATININE 0.93 0.92  CALCIUM 8.2* 8.5  MG -- 1.5  PHOS -- 3.1    Basename 01/16/12 0455 01/15/12 1230  AST 16 17  ALT 14 16  ALKPHOS 81 85  BILITOT 0.2* 0.3  PROT 5.5* 5.6*  ALBUMIN 2.7* 2.7*   No results found for this basename: LIPASE:2,AMYLASE:2 in the last 72 hours  Basename 01/16/12 0455 01/15/12 1230 01/15/12 0445  WBC 6.3 5.7 --  NEUTROABS -- 3.1 4.9  HGB 10.5* 11.0* --  HCT 31.2* 32.9* --  MCV 93.1 92.4 --  PLT 198 203 --   No results found for this basename: CKTOTAL:3,CKMB:3,CKMBINDEX:3,TROPONINI:3 in the last 72 hours No components found with this basename: POCBNP:3 No results found for this basename: DDIMER:2 in the last 72 hours  Basename 01/15/12 1230  HGBA1C 6.8*   No results found for this basename:  CHOL:2,HDL:2,LDLCALC:2,TRIG:2,CHOLHDL:2,LDLDIRECT:2 in the last 72 hours  Basename 01/15/12 1230  TSH 1.072  T4TOTAL --  T3FREE --  THYROIDAB --   No results found for this basename: VITAMINB12:2,FOLATE:2,FERRITIN:2,TIBC:2,IRON:2,RETICCTPCT:2 in the last 72 hours  Micro Results: Recent Results (from the past 240 hour(s))  CULTURE, BLOOD (ROUTINE X 2)     Status: Normal (Preliminary result)   Collection Time   01/12/12  8:40 PM      Component Value Range Status Comment   Specimen Description BLOOD LEFT HAND   Final    Special Requests BOTTLES DRAWN AEROBIC AND ANAEROBIC 10CC EA   Final    Culture  Setup Time [id number]   Final    Culture     Final    Value:        BLOOD CULTURE RECEIVED NO GROWTH TO DATE CULTURE WILL BE HELD FOR 5 DAYS BEFORE ISSUING A FINAL NEGATIVE REPORT   Report Status PENDING   Incomplete   CULTURE, BLOOD (ROUTINE X 2)     Status: Normal (Preliminary result)   Collection Time   01/12/12  8:50 PM      Component Value Range Status Comment   Specimen Description BLOOD LEFT ARM   Final    Special Requests BOTTLES DRAWN AEROBIC ONLY 10CC   Final  Culture  Setup Time [id number]   Final    Culture     Final    Value:        BLOOD CULTURE RECEIVED NO GROWTH TO DATE CULTURE WILL BE HELD FOR 5 DAYS BEFORE ISSUING A FINAL NEGATIVE REPORT   Report Status PENDING   Incomplete     Studies/Results: No results found.  Medications:    . ampicillin-sulbactam (UNASYN) IV  1.5 g Intravenous Q6H  . calcium carbonate  1,250 mg Oral Daily  . cholecalciferol  1,000 Units Oral Daily  . enoxaparin  40 mg Subcutaneous Q24H  . insulin aspart protamine-insulin aspart  5 Units Subcutaneous BID WC  . levothyroxine  100 mcg Oral Daily  . mulitivitamin with minerals  1 tablet Oral Daily  . pantoprazole  40 mg Oral Daily  . simvastatin  20 mg Oral q1800  . DISCONTD: insulin aspart protamine-insulin aspart  10 Units Subcutaneous BID WC  . DISCONTD: lisinopril  10 mg Oral Daily     Assessment: Principal Problem:  *Cellulitis of hand, left Active Problems:  Diabetes mellitus  Hypertension associated with diabetes  GERD (gastroesophageal reflux disease)  Hyperlipidemia LDL goal < 70  Hypotension  Hypothyroidism   Plan: 1. Left hand cellulitis secondary to cat bite Clinical improvement on IV antibiotics. We'll continue IV Unasyn through today and will change to an oral regimen in the morning. Elevate left upper extremity.  #2 diabetes mellitus type 2 Hemoglobin A1c was 6.8. Patient's CBGs have ranged from 49 to 90. Will decrease patient's 70/30-5 units twice daily. We'll follow CBGs. If patient still with hypoglycemic episodes we'll need to discontinue patient's 70/30.  #3 hypertension Patient with systolic blood pressures in the 90s to low 100s. Will discontinue patient's lisinopril and check blood pressures every 4 hours for the next 24 hours.  #4 gastroesophageal reflux disease PPI  #5 hypothyroidism Continue home regimen of Synthroid  #6 prophylaxis PPI for GI prophylaxis, Lovenox for DVT prophylaxis.   LOS: 1 day   Tiron Suski [phone] 01/16/2012, 10:25 AM

## 2012-01-16 NOTE — Progress Notes (Signed)
Pt blood sugar is 49 pt given gram crackers and orange juice will recheck pt's blood sugar in 15 mins.

## 2012-01-16 NOTE — Progress Notes (Signed)
Physical Therapy Note  Order received and chart reviewed.  Spoke with pt at bedside and she states she has no physical therapy needs at this time.  Pt reports ambulating in halls and denies being unsteady.  Pt also reports use of L UE back to normal (swelling decreased and able to grip per pt report).  Pt denies DME needs.  PT to sign off.  Zenovia Jarred, PT Pager: [phone]

## 2012-01-17 DIAGNOSIS — L02519 Cutaneous abscess of unspecified hand: Secondary | ICD-10-CM

## 2012-01-17 DIAGNOSIS — L03119 Cellulitis of unspecified part of limb: Secondary | ICD-10-CM

## 2012-01-17 DIAGNOSIS — E1165 Type 2 diabetes mellitus with hyperglycemia: Secondary | ICD-10-CM

## 2012-01-17 DIAGNOSIS — I10 Essential (primary) hypertension: Secondary | ICD-10-CM

## 2012-01-17 DIAGNOSIS — IMO0001 Reserved for inherently not codable concepts without codable children: Secondary | ICD-10-CM

## 2012-01-17 LAB — BASIC METABOLIC PANEL
Chloride: 109 mEq/L (ref 96–112)
Creatinine, Ser: 1.06 mg/dL (ref 0.50–1.10)
GFR calc Af Amer: 61 mL/min — ABNORMAL LOW (ref >90)
GFR calc non Af Amer: 53 mL/min — ABNORMAL LOW (ref >90)
Potassium: 3.8 mEq/L (ref 3.5–5.1)

## 2012-01-17 LAB — CBC
MCHC: 33.4 g/dL (ref 30.0–36.0)
MCV: 93.4 fL (ref 78.0–100.0)
Platelets: 204 10*3/uL (ref 150–400)
RDW: 13.7 % (ref 11.5–15.5)
WBC: 5.7 10*3/uL (ref 4.0–10.5)

## 2012-01-17 MED ORDER — INSULIN ASPART PROT & ASPART (70-30 MIX) 100 UNIT/ML ~~LOC~~ SUSP: 5.0000 [IU] | Freq: Two times a day (BID) | SUBCUTANEOUS | Status: DC

## 2012-01-17 MED ORDER — AMOXICILLIN-POT CLAVULANATE 875-125 MG PO TABS: 1.0000 | ORAL_TABLET | Freq: Two times a day (BID) | ORAL | Status: AC

## 2012-01-17 MED ORDER — HYDROCODONE-ACETAMINOPHEN 5-325 MG PO TABS: 1.0000 | ORAL_TABLET | ORAL | Status: AC | PRN

## 2012-01-17 NOTE — Discharge Summary (Signed)
Discharge Summary  Deborah Travis MR#: [id number]  DOB:Aug 10, 1941  Date of Admission: 01/15/2012 Date of Discharge: 01/17/2012  Patient's PCP: Deborah Herter, MD, MD  Attending Physician:Deborah Travis  Consults:   None  Discharge Diagnoses: Cellulitis of hand, left Present on Admission:  .Cellulitis of hand, left .Diabetes mellitus .GERD (gastroesophageal reflux disease) .Hypotension .Hypothyroidism   Brief Admitting History and Physical Pleasant 71 year old female with history of HTN, DM, dyslipidemia who presented to ED with worsening swelling and erythema and tenderness over the left posterior aspect of the hand. Pain is 8/10 in intensity, non radiating and it did not get better with PO antibiotic. Patient came to ED 1 day ago and was sent home with PO antibiotics but has noticed that the swelling was getting worse and she has returned to ED for IV antibiotics. Patient reported subjective fever and chills. NO other complaints of shortness of breath, cough, chest pain. No abdominal pain, no nausea or vomiting. For the rest of admission history and physical please see H&P dictated by Dr. Elisabeth Travis.     Discharge Medications Medication List  As of 01/17/2012  9:23 AM   START taking these medications         HYDROcodone-acetaminophen 5-325 MG per tablet   Commonly known as: NORCO   Take 1-2 tablets by mouth every 4 (four) hours as needed for pain.         CHANGE how you take these medications         amoxicillin-clavulanate 875-125 MG per tablet   Commonly known as: AUGMENTIN   Take 1 tablet by mouth 2 (two) times daily. Take for 12 days.   What changed: doctor's instructions      insulin aspart protamine-insulin aspart (70-30) 100 UNIT/ML injection   Commonly known as: NOVOLOG 70/30   Inject 5 Units into the skin 2 (two) times daily with a meal.   What changed: dose         CONTINUE taking these medications         calcium carbonate 1250 MG capsule     cholecalciferol 1000 UNITS tablet   Commonly known as: VITAMIN D      esomeprazole 40 MG capsule   Commonly known as: NEXIUM      levothyroxine 100 MCG tablet   Commonly known as: SYNTHROID, LEVOTHROID   Take 1 tablet (100 mcg total) by mouth daily.      metFORMIN 1000 MG tablet   Commonly known as: GLUCOPHAGE   Take 1 tablet (1,000 mg total) by mouth 2 (two) times daily with a meal.      mulitivitamin with minerals Tabs      pravastatin 40 MG tablet   Commonly known as: PRAVACHOL   Take 1 tablet (40 mg total) by mouth daily.         STOP taking these medications         lisinopril 10 MG tablet          Where to get your medications    These are the prescriptions that you need to pick up.   You may get these medications from any pharmacy.         amoxicillin-clavulanate 875-125 MG per tablet   HYDROcodone-acetaminophen 5-325 MG per tablet   insulin aspart protamine-insulin aspart (70-30) 100 UNIT/ML injection           Hospital Course: #1Cellulitis of hand, left Patient was admitted with worsening cellulitis of her left hand secondary to a cat bite.  Patient had previously been admitted 01/12/2012 01/14/2012 cellulitis with a cat bite. Patient during that hospitalization improved on IV Unasyn and was discharged home on Augmentin. However patient's symptoms worsened on oral Augmentin and she subsequently presented to the hospital for readmission. Patient was admitted and subsequently placed on IV Unasyn with good clinical response. Patient's swelling and erythema improved and pain in the left hand or improved. Patient improved clinically on a daily basis and will be discharged home on Augmentin for 12 more days to complete a two-week course of antibiotic therapy. Patient will followup with PCP as outpatient.  #2 well-controlled type 2 diabetes mellitus Patient was on metformin and NPH 70/30 10 units twice a day at home. During this hospitalization the patient experienced  some hypoglycemic episodes with CBGs troponin is normal at 49. Patient's metformin was held during this hospitalization. Patient's NPH 70/30 was decreased to 5 units twice daily with improvement in her hypoglycemic episodes. Patient did not have any further hypoglycemic episodes during the hospitalization and will be discharged home on NPH 70/30 at 5 units twice daily. Patient will followup with PCP as outpatient.  #3 hypertension Patient was noted to be on 10 mg of lisinopril daily at home. During this hospitalization patient had episodes borderline hypotension with systolic blood pressures in the 90s. Patient's lisinopril was subsequently discontinued during the hospitalization. Patient blood pressure remained stable off her lisinopril and her systolic blood pressures ranged from 105- 124/63-92. Patient will be discharged home off of her antihypertensive medications. Patient will followup with PCP as outpatient at which point in time to determine as to whether to resume patient's lisinopril at a lower dose or at her home regimen.  Present on Admission:  .Cellulitis of hand, left .Diabetes mellitus .GERD (gastroesophageal reflux disease) .Hypotension .Hypothyroidism   Day of Discharge BP 124/76  Pulse 56  Temp(Src) 97.8 F (36.6 C) (Oral)  Resp 18  Ht 5\' 3"  (1.6 m)  Wt 52.5 kg (115 lb 11.9 oz)  BMI 20.50 kg/m2  SpO2 97% Subjective: Patient feels much better. Left hand swelling has improved and the erythema has improved. Patient wants to go home. General: Alert, awake, oriented x3, in no acute distress. HEENT: No bruits, no goiter. Heart: Regular rate and rhythm, without murmurs, rubs, gallops. Lungs: Clear to auscultation bilaterally. Abdomen: Soft, nontender, nondistended, positive bowel sounds. Extremities: No clubbing cyanosis or edema with positive pedal pulses. Left hand with no erythema, no warmth, minimal tenderness to palpation. No edema. Neuro: Grossly intact,  nonfocal.   Results for orders placed during the hospital encounter of 01/15/12 (from the past 48 hour(s))  CBC     Status: Abnormal   Collection Time   01/15/12 12:30 PM      Component Value Range Comment   WBC 5.7  4.0 - 10.5 (K/uL)    RBC 3.56 (*) 3.87 - 5.11 (MIL/uL)    Hemoglobin 11.0 (*) 12.0 - 15.0 (g/dL)    HCT 40.9 (*) 81.1 - 46.0 (%)    MCV 92.4  78.0 - 100.0 (fL)    MCH 30.9  26.0 - 34.0 (pg)    MCHC 33.4  30.0 - 36.0 (g/dL)    RDW 91.4  78.2 - 95.6 (%)    Platelets 203  150 - 400 (K/uL)   COMPREHENSIVE METABOLIC PANEL     Status: Abnormal   Collection Time   01/15/12 12:30 PM      Component Value Range Comment   Sodium 141  135 - 145 (mEq/L)  Potassium 3.7  3.5 - 5.1 (mEq/L)    Chloride 111  96 - 112 (mEq/L)    CO2 20  19 - 32 (mEq/L)    Glucose, Bld 140 (*) 70 - 99 (mg/dL)    BUN 8  6 - 23 (mg/dL)    Creatinine, Ser 1.47  0.50 - 1.10 (mg/dL)    Calcium 8.5  8.4 - 10.5 (mg/dL)    Total Protein 5.6 (*) 6.0 - 8.3 (g/dL)    Albumin 2.7 (*) 3.5 - 5.2 (g/dL)    AST 17  0 - 37 (U/L)    ALT 16  0 - 35 (U/L)    Alkaline Phosphatase 85  39 - 117 (U/L)    Total Bilirubin 0.3  0.3 - 1.2 (mg/dL)    GFR calc non Af Amer 63 (*) >90 (mL/min)    GFR calc Af Amer 72 (*) >90 (mL/min)   MAGNESIUM     Status: Normal   Collection Time   01/15/12 12:30 PM      Component Value Range Comment   Magnesium 1.5  1.5 - 2.5 (mg/dL)   PHOSPHORUS     Status: Normal   Collection Time   01/15/12 12:30 PM      Component Value Range Comment   Phosphorus 3.1  2.3 - 4.6 (mg/dL)   DIFFERENTIAL     Status: Normal   Collection Time   01/15/12 12:30 PM      Component Value Range Comment   Neutrophils Relative 55  43 - 77 (%)    Neutro Abs 3.1  1.7 - 7.7 (K/uL)    Lymphocytes Relative 32  12 - 46 (%)    Lymphs Abs 1.9  0.7 - 4.0 (K/uL)    Monocytes Relative 10  3 - 12 (%)    Monocytes Absolute 0.6  0.1 - 1.0 (K/uL)    Eosinophils Relative 3  0 - 5 (%)    Eosinophils Absolute 0.2  0.0 - 0.7  (K/uL)    Basophils Relative 0  0 - 1 (%)    Basophils Absolute 0.0  0.0 - 0.1 (K/uL)   APTT     Status: Normal   Collection Time   01/15/12 12:30 PM      Component Value Range Comment   aPTT 37  24 - 37 (seconds)   PROTIME-INR     Status: Normal   Collection Time   01/15/12 12:30 PM      Component Value Range Comment   Prothrombin Time 14.6  11.6 - 15.2 (seconds)    INR 1.12  0.00 - 1.49    TSH     Status: Normal   Collection Time   01/15/12 12:30 PM      Component Value Range Comment   TSH 1.072  0.350 - 4.500 (uIU/mL)   HEMOGLOBIN A1C     Status: Abnormal   Collection Time   01/15/12 12:30 PM      Component Value Range Comment   Hemoglobin A1C 6.8 (*) <5.7 (%)    Mean Plasma Glucose 148 (*) <117 (mg/dL)   GLUCOSE, CAPILLARY     Status: Abnormal   Collection Time   01/16/12  2:21 AM      Component Value Range Comment   Glucose-Capillary 49 (*) 70 - 99 (mg/dL)   GLUCOSE, CAPILLARY     Status: Normal   Collection Time   01/16/12  2:43 AM      Component Value Range Comment   Glucose-Capillary 90  70 - 99 (mg/dL)   COMPREHENSIVE METABOLIC PANEL     Status: Abnormal   Collection Time   01/16/12  4:55 AM      Component Value Range Comment   Sodium 139  135 - 145 (mEq/L)    Potassium 3.8  3.5 - 5.1 (mEq/L)    Chloride 109  96 - 112 (mEq/L)    CO2 21  19 - 32 (mEq/L)    Glucose, Bld 231 (*) 70 - 99 (mg/dL)    BUN 11  6 - 23 (mg/dL)    Creatinine, Ser 1.91  0.50 - 1.10 (mg/dL)    Calcium 8.2 (*) 8.4 - 10.5 (mg/dL)    Total Protein 5.5 (*) 6.0 - 8.3 (g/dL)    Albumin 2.7 (*) 3.5 - 5.2 (g/dL)    AST 16  0 - 37 (U/L)    ALT 14  0 - 35 (U/L)    Alkaline Phosphatase 81  39 - 117 (U/L)    Total Bilirubin 0.2 (*) 0.3 - 1.2 (mg/dL)    GFR calc non Af Amer 62 (*) >90 (mL/min)    GFR calc Af Amer 72 (*) >90 (mL/min)   CBC     Status: Abnormal   Collection Time   01/16/12  4:55 AM      Component Value Range Comment   WBC 6.3  4.0 - 10.5 (K/uL)    RBC 3.35 (*) 3.87 - 5.11 (MIL/uL)     Hemoglobin 10.5 (*) 12.0 - 15.0 (g/dL)    HCT 47.8 (*) 29.5 - 46.0 (%)    MCV 93.1  78.0 - 100.0 (fL)    MCH 31.3  26.0 - 34.0 (pg)    MCHC 33.7  30.0 - 36.0 (g/dL)    RDW 62.1  30.8 - 65.7 (%)    Platelets 198  150 - 400 (K/uL)   GLUCOSE, CAPILLARY     Status: Abnormal   Collection Time   01/16/12 12:17 PM      Component Value Range Comment   Glucose-Capillary 177 (*) 70 - 99 (mg/dL)    Comment 1 Notify RN     GLUCOSE, CAPILLARY     Status: Abnormal   Collection Time   01/16/12  5:26 PM      Component Value Range Comment   Glucose-Capillary 121 (*) 70 - 99 (mg/dL)    Comment 1 Notify RN     CBC     Status: Abnormal   Collection Time   01/17/12  5:18 AM      Component Value Range Comment   WBC 5.7  4.0 - 10.5 (K/uL)    RBC 3.46 (*) 3.87 - 5.11 (MIL/uL)    Hemoglobin 10.8 (*) 12.0 - 15.0 (g/dL)    HCT 84.6 (*) 96.2 - 46.0 (%)    MCV 93.4  78.0 - 100.0 (fL)    MCH 31.2  26.0 - 34.0 (pg)    MCHC 33.4  30.0 - 36.0 (g/dL)    RDW 95.2  84.1 - 32.4 (%)    Platelets 204  150 - 400 (K/uL)   BASIC METABOLIC PANEL     Status: Abnormal   Collection Time   01/17/12  5:18 AM      Component Value Range Comment   Sodium 140  135 - 145 (mEq/L)    Potassium 3.8  3.5 - 5.1 (mEq/L)    Chloride 109  96 - 112 (mEq/L)    CO2 22  19 - 32 (mEq/L)  Glucose, Bld 92  70 - 99 (mg/dL)    BUN 14  6 - 23 (mg/dL)    Creatinine, Ser 1.61  0.50 - 1.10 (mg/dL)    Calcium 8.1 (*) 8.4 - 10.5 (mg/dL)    GFR calc non Af Amer 53 (*) >90 (mL/min)    GFR calc Af Amer 61 (*) >90 (mL/min)     Dg Hand Complete Left  01/12/2012  *RADIOLOGY REPORT*  Clinical Data: Left hand injury and pain.  LEFT HAND - COMPLETE 3+ VIEW  Comparison: None  Findings: No evidence of acute fracture, subluxation or dislocation identified.  No radio-opaque foreign bodies are present.  No focal bony lesions are noted.  The joint spaces are unremarkable.  IMPRESSION: No evidence of bony abnormality.  Original Report Authenticated By:  Rosendo Gros, M.D.     Disposition: Home  Diet: Carb Modified  Activity: Increase activity slowly   Follow-up Appts: Discharge Orders    Future Appointments: Provider: Department: Dept Phone: Center:   02/05/2012 8:45 AM Ronnald Nian, MD [facility] [phone] [facility]     Future Orders Please Complete By Expires   Diet Carb Modified      Increase activity slowly      Discharge instructions      Comments:   Follow up with Deborah Herter, MD, as scheduled       TESTS THAT NEED FOLLOW-UP Followup on patient's blood pressure and blood glucose levels. Patient's lisinopril was discontinued secondary to borderline hypotension and her 70/30 NPH was decreased in half secondary to hypoglycemic episodes.  Time spent on discharge, talking to the patient, and coordinating care: 45 mins.   Signed: Cristal Howatt [phone] 01/17/2012, 9:23 AM

## 2012-01-19 LAB — CULTURE, BLOOD (ROUTINE X 2)
Culture  Setup Time: 201305190126
Culture: NO GROWTH

## 2012-01-23 LAB — GLUCOSE, CAPILLARY
Glucose-Capillary: 147 mg/dL — ABNORMAL HIGH (ref 70–99)
Glucose-Capillary: 108 mg/dL — ABNORMAL HIGH (ref 70–99)
Glucose-Capillary: 93 mg/dL (ref 70–99)

## 2012-02-05 ENCOUNTER — Ambulatory Visit (INDEPENDENT_AMBULATORY_CARE_PROVIDER_SITE_OTHER): Payer: Medicare Other | Admitting: Family Medicine

## 2012-02-05 ENCOUNTER — Encounter: Payer: Self-pay | Admitting: Family Medicine

## 2012-02-05 VITALS — BP 110/60 | HR 70 | Wt 109.0 lb

## 2012-02-05 DIAGNOSIS — L03114 Cellulitis of left upper limb: Secondary | ICD-10-CM

## 2012-02-05 DIAGNOSIS — E119 Type 2 diabetes mellitus without complications: Secondary | ICD-10-CM

## 2012-02-05 DIAGNOSIS — L02519 Cutaneous abscess of unspecified hand: Secondary | ICD-10-CM

## 2012-02-05 NOTE — Progress Notes (Signed)
  Subjective:    Patient ID: Deborah Travis, female    DOB: [birth year], 71 y.o.   MRN: [id number]  HPI She is here for a followup visit after 2 hospitalizations for treatment of cellulitis to the dorsal surface of the hand following a cat bite. She was treated with Augmentin and on her second admission given IV antibiotics and sent home on Augmentin. She has done quite well since then. She does have concerns over her diabetes and potentially be able to come off of the insulin. Her last hemoglobin A1c is 6.8.  Review of Systems     Objective:   Physical Exam A. exam of her hands shows no erythema, swelling or tenderness to palpation with normal motion.       Assessment & Plan:   1. Cellulitis of left hand   2. Diabetes mellitus    the cellulitis is now resolved. I discussed her diabetes care. Encouraged her to continue on her present medication regimen. I stated that I did not think it would be a good idea to try and taper off the insulin at this time.

## 2012-04-02 ENCOUNTER — Telehealth: Payer: Self-pay | Admitting: Family Medicine

## 2012-04-02 MED ORDER — LEVOTHYROXINE SODIUM 100 MCG PO TABS: 100.0000 ug | ORAL_TABLET | Freq: Every day | ORAL | Status: DC

## 2012-04-02 MED ORDER — PRAVASTATIN SODIUM 40 MG PO TABS: 40.0000 mg | ORAL_TABLET | Freq: Every day | ORAL | Status: DC

## 2012-04-02 MED ORDER — METFORMIN HCL 1000 MG PO TABS: 1000.0000 mg | ORAL_TABLET | Freq: Two times a day (BID) | ORAL | Status: DC

## 2012-04-02 NOTE — Telephone Encounter (Signed)
Sent pt med in per pt request

## 2012-04-04 ENCOUNTER — Ambulatory Visit: Payer: Medicare Other | Admitting: Medical

## 2012-04-11 ENCOUNTER — Telehealth: Payer: Self-pay | Admitting: Family Medicine

## 2012-04-11 ENCOUNTER — Other Ambulatory Visit: Payer: Self-pay | Admitting: Medical

## 2012-04-11 MED ORDER — LISINOPRIL 10 MG PO TABS: 10.0000 mg | ORAL_TABLET | Freq: Every day | ORAL | Status: DC

## 2012-04-11 NOTE — Telephone Encounter (Signed)
Needs refill on lisinopril 10 , she states she has been on this for years. All of her other medications were sent in but not this one            [facility]

## 2012-04-11 NOTE — Telephone Encounter (Signed)
rx sent for lisinopril.

## 2012-06-12 ENCOUNTER — Ambulatory Visit (INDEPENDENT_AMBULATORY_CARE_PROVIDER_SITE_OTHER): Payer: Medicare Other | Admitting: Family Medicine

## 2012-06-12 ENCOUNTER — Encounter: Payer: Self-pay | Admitting: Family Medicine

## 2012-06-12 VITALS — BP 100/60 | HR 80 | Wt 109.0 lb

## 2012-06-12 DIAGNOSIS — E039 Hypothyroidism, unspecified: Secondary | ICD-10-CM

## 2012-06-12 DIAGNOSIS — E119 Type 2 diabetes mellitus without complications: Secondary | ICD-10-CM

## 2012-06-12 DIAGNOSIS — K219 Gastro-esophageal reflux disease without esophagitis: Secondary | ICD-10-CM

## 2012-06-12 DIAGNOSIS — Z23 Encounter for immunization: Secondary | ICD-10-CM

## 2012-06-12 DIAGNOSIS — E785 Hyperlipidemia, unspecified: Secondary | ICD-10-CM

## 2012-06-12 LAB — LIPID PANEL
Cholesterol: 86 mg/dL (ref 0–200)
HDL: 45 mg/dL (ref >39)
Total CHOL/HDL Ratio: 1.9 Ratio
Triglycerides: 82 mg/dL (ref <150)

## 2012-06-12 LAB — POCT GLYCOSYLATED HEMOGLOBIN (HGB A1C): Hemoglobin A1C: 6.3

## 2012-06-12 MED ORDER — INFLUENZA VIRUS VACC SPLIT PF IM SUSP: 0.5000 mL | Freq: Once | INTRAMUSCULAR | Status: DC

## 2012-06-12 NOTE — Progress Notes (Signed)
  Subjective:    Patient ID: Deborah Travis, female    DOB: 1941-04-30, 71 y.o.   MRN: [id number]  HPI She is her for recheck. She does check her blood sugars at least twice per day. Her numbers usually run in the low 100s. Her reflux is under good control and she intermittently uses her medication for this. She continues on her thyroid medication. She's had no difficulty with her statin drugs. Smoking and drinking were reviewed. She does keep yourself active. She checks her feet regularly and has had an eye exam this year.   Review of Systems     Objective:   Physical Exam Alert and in no distress. Hemoglobin A1c is 6.3.       Assessment & Plan:   1. Diabetes mellitus  POCT glycosylated hemoglobin (Hb A1C)  2. GERD (gastroesophageal reflux disease)    3. Hypothyroidism    4. Hyperlipidemia LDL goal < 70  Lipid Panel  5. Need for prophylactic vaccination and inoculation against influenza  influenza  inactive virus vaccine (FLUZONE/FLUARIX) injection 0.5 mL   I also wrote her prescription for her to get Zostavax. She would like to hold getting the TDaP.at this time. Recheck here in 4 months.

## 2012-06-13 ENCOUNTER — Other Ambulatory Visit: Payer: Self-pay

## 2012-06-13 MED ORDER — PRAVASTATIN SODIUM 20 MG PO TABS: 20.0000 mg | ORAL_TABLET | Freq: Every day | ORAL | Status: DC

## 2012-06-13 NOTE — Progress Notes (Signed)
Quick Note:  PT WAS ADVISED I OF TO SPLIT THE 40 MG TILL GONE AND THE 20 MG HAS BEEN CALLED IN PT VERBALIZED UNDERSTANDING  ______

## 2012-06-13 NOTE — Telephone Encounter (Signed)
Sent in 20 mg pravastatin per [employer]

## 2012-06-13 NOTE — Progress Notes (Signed)
Quick Note:  Her lipids are lower than need be. Have her back off to 20 mg of Pravachol. Call that in ______

## 2012-06-13 NOTE — Progress Notes (Signed)
Quick Note:  CALLED PT TO INFORM HER OF LABS AND MED CHANGE ______

## 2012-08-18 ENCOUNTER — Other Ambulatory Visit: Payer: Self-pay | Admitting: Family Medicine

## 2012-08-18 MED ORDER — LISINOPRIL 10 MG PO TABS: 10.0000 mg | ORAL_TABLET | Freq: Every day | ORAL | Status: DC

## 2012-08-18 MED ORDER — PRAVASTATIN SODIUM 20 MG PO TABS: 20.0000 mg | ORAL_TABLET | Freq: Every day | ORAL | Status: DC

## 2012-08-18 MED ORDER — METFORMIN HCL 1000 MG PO TABS: 1000.0000 mg | ORAL_TABLET | Freq: Two times a day (BID) | ORAL | Status: DC

## 2012-08-18 MED ORDER — LEVOTHYROXINE SODIUM 100 MCG PO TABS: 100.0000 ug | ORAL_TABLET | Freq: Every day | ORAL | Status: DC

## 2012-08-18 NOTE — Telephone Encounter (Signed)
Sent meds in

## 2012-08-27 HISTORY — PX: CATARACT EXTRACTION: SUR2

## 2012-10-14 ENCOUNTER — Ambulatory Visit (INDEPENDENT_AMBULATORY_CARE_PROVIDER_SITE_OTHER): Payer: Medicare Other | Admitting: Family Medicine

## 2012-10-14 ENCOUNTER — Encounter: Payer: Self-pay | Admitting: Family Medicine

## 2012-10-14 VITALS — BP 106/70 | HR 79 | Wt 104.0 lb

## 2012-10-14 DIAGNOSIS — I10 Essential (primary) hypertension: Secondary | ICD-10-CM

## 2012-10-14 DIAGNOSIS — K219 Gastro-esophageal reflux disease without esophagitis: Secondary | ICD-10-CM

## 2012-10-14 DIAGNOSIS — E1169 Type 2 diabetes mellitus with other specified complication: Secondary | ICD-10-CM

## 2012-10-14 DIAGNOSIS — E119 Type 2 diabetes mellitus without complications: Secondary | ICD-10-CM

## 2012-10-14 DIAGNOSIS — E1159 Type 2 diabetes mellitus with other circulatory complications: Secondary | ICD-10-CM

## 2012-10-14 DIAGNOSIS — I152 Hypertension secondary to endocrine disorders: Secondary | ICD-10-CM

## 2012-10-14 DIAGNOSIS — E785 Hyperlipidemia, unspecified: Secondary | ICD-10-CM

## 2012-10-14 DIAGNOSIS — E039 Hypothyroidism, unspecified: Secondary | ICD-10-CM

## 2012-10-14 NOTE — Progress Notes (Signed)
  Subjective:    Patient ID: Deborah Travis, female    DOB: 04/22/1941, 72 y.o.   MRN: [id number]  HPI She is here for a diabetes recheck. She does check her sugars twice per day. She does check her feet regularly and will reschedule an eye exam. She is getting ready did have cataract surgery. She still does smoke 3 cigarettes per day. She does not exercise but does stay active. She does not drink.She continues on medications listed in the chart. She does occasionally have difficulty with reflux symptoms   Review of Systems     Objective:   Physical Exam Alert and in no distress. Hemoglobin A1c is 6.3.       Assessment & Plan:  Diabetes mellitus - Plan: POCT glycosylated hemoglobin (Hb A1C)  Hypertension associated with diabetes  Hyperlipidemia LDL goal < 70  Hypothyroidism  GERD (gastroesophageal reflux disease) encouraged her to continue to take excellent care of herself. Also discussed possibly having the cataract surgery done in a place that her Medicare insurance will cover.

## 2012-10-14 NOTE — Patient Instructions (Signed)
Keep taking good care of yourself 

## 2012-10-16 ENCOUNTER — Ambulatory Visit: Payer: Medicare Other | Admitting: Family Medicine

## 2012-12-31 ENCOUNTER — Ambulatory Visit (INDEPENDENT_AMBULATORY_CARE_PROVIDER_SITE_OTHER): Payer: Medicare Other | Admitting: Family Medicine

## 2012-12-31 ENCOUNTER — Encounter: Payer: Self-pay | Admitting: Family Medicine

## 2012-12-31 VITALS — BP 116/70 | HR 64 | Wt 109.0 lb

## 2012-12-31 DIAGNOSIS — S7010XA Contusion of unspecified thigh, initial encounter: Secondary | ICD-10-CM

## 2012-12-31 DIAGNOSIS — S7011XA Contusion of right thigh, initial encounter: Secondary | ICD-10-CM

## 2012-12-31 NOTE — Progress Notes (Signed)
  Subjective:    Patient ID: Deborah Travis, female    DOB: 19-Sep-1940, 72 y.o.   MRN: [id number]  HPI She woke up this morning noting some pain and swelling in the right superior lateral knee area. He has no history of trauma to the area.   Review of Systems     Objective:   Physical Exam 2 x 3 cm tender slightly fluctuant lesion is noted in the right lateral quadrant mechanism near the knee.       Assessment & Plan:  Thigh contusion, right, initial encounter explained that she probably contused herself but does not remember this. Recommend conservative care however if the symptoms worsen, she is to return here for further consultation.

## 2012-12-31 NOTE — Patient Instructions (Signed)

## 2013-02-12 ENCOUNTER — Ambulatory Visit: Payer: Medicare Other | Admitting: Family Medicine

## 2013-02-24 ENCOUNTER — Ambulatory Visit (INDEPENDENT_AMBULATORY_CARE_PROVIDER_SITE_OTHER): Payer: Medicare Other | Admitting: Family Medicine

## 2013-02-24 ENCOUNTER — Encounter: Payer: Self-pay | Admitting: Family Medicine

## 2013-02-24 VITALS — BP 110/70 | HR 73 | Wt 108.0 lb

## 2013-02-24 DIAGNOSIS — E119 Type 2 diabetes mellitus without complications: Secondary | ICD-10-CM

## 2013-02-24 DIAGNOSIS — S92401A Displaced unspecified fracture of right great toe, initial encounter for closed fracture: Secondary | ICD-10-CM

## 2013-02-24 DIAGNOSIS — E039 Hypothyroidism, unspecified: Secondary | ICD-10-CM

## 2013-02-24 DIAGNOSIS — I10 Essential (primary) hypertension: Secondary | ICD-10-CM

## 2013-02-24 DIAGNOSIS — I152 Hypertension secondary to endocrine disorders: Secondary | ICD-10-CM

## 2013-02-24 DIAGNOSIS — E1169 Type 2 diabetes mellitus with other specified complication: Secondary | ICD-10-CM

## 2013-02-24 DIAGNOSIS — E785 Hyperlipidemia, unspecified: Secondary | ICD-10-CM

## 2013-02-24 DIAGNOSIS — Z79899 Other long term (current) drug therapy: Secondary | ICD-10-CM

## 2013-02-24 DIAGNOSIS — S92919A Unspecified fracture of unspecified toe(s), initial encounter for closed fracture: Secondary | ICD-10-CM

## 2013-02-24 LAB — COMPREHENSIVE METABOLIC PANEL
AST: 24 U/L (ref 0–37)
Albumin: 3.9 g/dL (ref 3.5–5.2)
Alkaline Phosphatase: 94 U/L (ref 39–117)
BUN: 11 mg/dL (ref 6–23)
Calcium: 9.2 mg/dL (ref 8.4–10.5)
Chloride: 111 mEq/L (ref 96–112)
Glucose, Bld: 83 mg/dL (ref 70–99)
Potassium: 4.4 mEq/L (ref 3.5–5.3)
Sodium: 141 mEq/L (ref 135–145)
Total Protein: 6.4 g/dL (ref 6.0–8.3)

## 2013-02-24 LAB — CBC WITH DIFFERENTIAL/PLATELET
Basophils Relative: 0 % (ref 0–1)
HCT: 38.4 % (ref 36.0–46.0)
Hemoglobin: 13 g/dL (ref 12.0–15.0)
Lymphocytes Relative: 34 % (ref 12–46)
MCHC: 33.9 g/dL (ref 30.0–36.0)
Monocytes Absolute: 0.6 10*3/uL (ref 0.1–1.0)
Monocytes Relative: 8 % (ref 3–12)
Neutro Abs: 4.8 10*3/uL (ref 1.7–7.7)
Neutrophils Relative %: 57 % (ref 43–77)
RBC: 4.21 MIL/uL (ref 3.87–5.11)
WBC: 8.3 10*3/uL (ref 4.0–10.5)

## 2013-02-24 LAB — POCT UA - MICROALBUMIN
Albumin/Creatinine Ratio, Urine, POC: <9.6
Creatinine, POC: 52.1 mg/dL

## 2013-02-24 LAB — LIPID PANEL
HDL: 51 mg/dL (ref >39)
LDL Cholesterol: 37 mg/dL (ref 0–99)
Triglycerides: 102 mg/dL (ref <150)

## 2013-02-24 LAB — TSH: TSH: 0.376 u[IU]/mL (ref 0.350–4.500)

## 2013-02-24 NOTE — Patient Instructions (Signed)
If you where harder soled shoes that'll be less painful

## 2013-02-24 NOTE — Progress Notes (Signed)
Subjective:    Deborah Travis is a 72 y.o. female who presents for follow-up of Type 2 diabetes mellitus.    Home blood sugar records: am 90's pm 110  Current symptoms none Daily foot checks, foot concerns: yes Last eye exam:   01/2013   Medication compliance: Good  Current diet: Fairly healthy Current exercise: walking 7 days a week Known diabetic complications: none Cardiovascular risk factors: advanced age (older than 88 for men, 20 for women), diabetes mellitus, dyslipidemia, hypertension and smoking/ tobacco exposure She was apparently told she had skipped beats and is here to discuss this. She has no symptoms but apparently a nurse noted this on exam. He also injured her right great toe 2 weeks ago when she fell down some steps wearing a pair of shoes  The following portions of the patient's history were reviewed and updated as appropriate: allergies, current medications, past medical history, past social history and problem list.  ROS as in subjective above    Objective:    General appearence: alert, no distress, WD/WN Cardiac exam shows a regular rhythm without murmurs or gallops. Ext: Right great toe does show some distal swelling with pain on motion and decreased motion. X-ray does show a fracture of the distal phalanx Adderly. It is nondisplaced. Lab Review Lab Results  Component Value Date   HGBA1C 6.3 10/14/2012   Lab Results  Component Value Date   CHOL 86 06/12/2012   HDL 45 06/12/2012   LDLCALC 25 06/12/2012   TRIG 82 06/12/2012   CHOLHDL 1.9 06/12/2012   No results found for this basename: [facility] [place]     Chemistry      Component Value Date/Time   NA 140 01/17/2012 0518   K 3.8 01/17/2012 0518   CL 109 01/17/2012 0518   CO2 22 01/17/2012 0518   BUN 14 01/17/2012 0518   CREATININE 1.06 01/17/2012 0518   CREATININE 0.85 09/05/2011 1716      Component Value Date/Time   CALCIUM 8.1* 01/17/2012 0518   ALKPHOS 81 01/16/2012 0455   AST 16 01/16/2012  0455   ALT 14 01/16/2012 0455   BILITOT 0.2* 01/16/2012 0455        Chemistry      Component Value Date/Time   NA 140 01/17/2012 0518   K 3.8 01/17/2012 0518   CL 109 01/17/2012 0518   CO2 22 01/17/2012 0518   BUN 14 01/17/2012 0518   CREATININE 1.06 01/17/2012 0518   CREATININE 0.85 09/05/2011 1716      Component Value Date/Time   CALCIUM 8.1* 01/17/2012 0518   ALKPHOS 81 01/16/2012 0455   AST 16 01/16/2012 0455   ALT 14 01/16/2012 0455   BILITOT 0.2* 01/16/2012 0455       Last optometry/ophthalmology exam reviewed from:    Assessment:   Encounter Diagnoses  Name Primary?  . Diabetes mellitus Yes  . Fractured great toe, right, closed, initial encounter   . Hypertension associated with diabetes   . Hyperlipidemia LDL goal < 70   . Hypothyroidism   . Encounter for long-term (current) use of other medications          Plan:    1.  Rx changes: none 2.  Education: Reviewed 'ABCs' of diabetes management (respective goals in parentheses):  A1C (<7), blood pressure (<130/80), and cholesterol (LDL <100). 3.  Compliance at present is estimated to be excellent. Efforts to improve compliance (if necessary) will be directed at none. 4. Follow up: 4 months  I discussed treatment of the toe with her including using hard soled shoes versus wooden shoe. She is decided to use her normal shoes. Discussed the fact that at this point mainly we need pain control. She will call me if she has further difficulty. Also discussed cardiac symptoms and since she's really having no symptoms at all, watchful waiting is a probe

## 2013-02-25 NOTE — Progress Notes (Signed)
Quick Note:  MAILED PT LETTER OF LABS ______ 

## 2013-03-18 ENCOUNTER — Telehealth: Payer: Self-pay | Admitting: Family Medicine

## 2013-03-18 MED ORDER — METFORMIN HCL 1000 MG PO TABS: 1000.0000 mg | ORAL_TABLET | Freq: Two times a day (BID) | ORAL | Status: DC

## 2013-03-18 MED ORDER — LISINOPRIL 10 MG PO TABS: 10.0000 mg | ORAL_TABLET | Freq: Every day | ORAL | Status: DC

## 2013-03-18 MED ORDER — LEVOTHYROXINE SODIUM 100 MCG PO TABS: 100.0000 ug | ORAL_TABLET | Freq: Every day | ORAL | Status: DC

## 2013-03-18 NOTE — Telephone Encounter (Signed)
Pt needs refill on Metformin, Lisinopril, Levothyroxin.  She had cpe scheduled for 9/11.  She wants 90 day refill to [facility]

## 2013-03-18 NOTE — Telephone Encounter (Signed)
Sent med in 

## 2013-03-19 ENCOUNTER — Encounter: Payer: Self-pay | Admitting: Family Medicine

## 2013-03-23 ENCOUNTER — Telehealth: Payer: Self-pay | Admitting: Family Medicine

## 2013-03-23 MED ORDER — PRAVASTATIN SODIUM 20 MG PO TABS: 20.0000 mg | ORAL_TABLET | Freq: Every day | ORAL | Status: DC

## 2013-03-23 NOTE — Telephone Encounter (Signed)
SENT MED IN 

## 2013-05-07 ENCOUNTER — Encounter: Payer: Self-pay | Admitting: Family Medicine

## 2013-05-07 ENCOUNTER — Ambulatory Visit (INDEPENDENT_AMBULATORY_CARE_PROVIDER_SITE_OTHER): Payer: Medicare Other | Admitting: Family Medicine

## 2013-05-07 VITALS — BP 100/60 | HR 88 | Ht 62.5 in | Wt 111.0 lb

## 2013-05-07 DIAGNOSIS — M899 Disorder of bone, unspecified: Secondary | ICD-10-CM

## 2013-05-07 DIAGNOSIS — Z0000 Encounter for general adult medical examination without abnormal findings: Secondary | ICD-10-CM

## 2013-05-07 DIAGNOSIS — I152 Hypertension secondary to endocrine disorders: Secondary | ICD-10-CM

## 2013-05-07 DIAGNOSIS — E119 Type 2 diabetes mellitus without complications: Secondary | ICD-10-CM

## 2013-05-07 DIAGNOSIS — E785 Hyperlipidemia, unspecified: Secondary | ICD-10-CM

## 2013-05-07 DIAGNOSIS — E039 Hypothyroidism, unspecified: Secondary | ICD-10-CM

## 2013-05-07 DIAGNOSIS — E1159 Type 2 diabetes mellitus with other circulatory complications: Secondary | ICD-10-CM

## 2013-05-07 DIAGNOSIS — K219 Gastro-esophageal reflux disease without esophagitis: Secondary | ICD-10-CM

## 2013-05-07 DIAGNOSIS — I10 Essential (primary) hypertension: Secondary | ICD-10-CM

## 2013-05-07 DIAGNOSIS — M8580 Other specified disorders of bone density and structure, unspecified site: Secondary | ICD-10-CM | POA: Insufficient documentation

## 2013-05-07 DIAGNOSIS — E1169 Type 2 diabetes mellitus with other specified complication: Secondary | ICD-10-CM

## 2013-05-07 NOTE — Progress Notes (Signed)
  Subjective:    Patient ID: Deborah Travis, female    DOB: 1940-10-02, 72 y.o.   MRN: [id number]  HPI She is here for complete examination. She does have underlying diabetes and continues on medications listed in the chart. She is doing well taking care of this. She keeps herself active, does check her feet regularly and had an eye exam last week. She gets regular followup visit. She continues on her blood pressure medication as well as for her reflux and is doing well on them. She does take thyroid medication. She has an underlying history of osteopenia and has not had a bone density scan in 4 years. Her immunizations are up to date except for Zostavax. She is divorced and does help take care of her grandchildren on occasion to   Review of Systems Negative except as above    Objective:   Physical Exam BP 100/60  Pulse 88  Ht 5' 2.5" (1.588 m)  Wt 111 lb (50.349 kg)  BMI 19.97 kg/m2  General Appearance:    Alert, cooperative, no distress, appears stated age  Head:    Normocephalic, without obvious abnormality, atraumatic  Eyes:    PERRL, conjunctiva/corneas clear, EOM's intact.  Ears:    Normal TM's and external ear canals  Nose:   Nares normal, mucosa normal, no drainage or sinus   tenderness  Throat:   Lips, mucosa, and tongue normal; teeth and gums normal  Neck:   Supple, no lymphadenopathy;  thyroid:  no   enlargement/tenderness/nodules; no carotid   bruit or JVD  Back:    Spine nontender, no curvature, ROM normal, no CVA     tenderness  Lungs:     Clear to auscultation bilaterally without wheezes, rales or     ronchi; respirations unlabored  Chest Wall:    No tenderness or deformity   Heart:    Regular rate and rhythm, S1 and S2 normal, no murmur, rub   or gallop  Breast Exam:    Deferred to GYN  Abdomen:     Soft, non-tender, nondistended, normoactive bowel sounds,    no masses, no hepatosplenomegaly  Genitalia:    Deferred to GYN     Extremities:   No clubbing, cyanosis or  edema  Pulses:   2+ and symmetric all extremities  Skin:   Skin color, texture, turgor normal, no rashes or lesions  Lymph nodes:   Cervical, supraclavicular, and axillary nodes normal  Neurologic:   CNII-XII intact, normal strength, sensation and gait; reflexes 2+ and symmetric throughout          Psych:   Normal mood, affect, hygiene and grooming.          Assessment & Plan:  Diabetes mellitus - Plan: HM Diabetes Foot Exam  Hypertension associated with diabetes  GERD (gastroesophageal reflux disease)  Hyperlipidemia LDL goal < 70  Hypothyroidism  Osteopenia - Plan: DG Bone Density, Vit D  25 hydroxy (rtn osteoporosis monitoring)  Routine general medical examination at a health care facility - Plan: MM Digital Screening  Prescription for Zostavax written. She will continue on her present medication regimen. His cluster smoking and she only smokes 3 cigarettes per day. She is not ready to stop .

## 2013-05-08 LAB — VITAMIN D 25 HYDROXY (VIT D DEFICIENCY, FRACTURES): Vit D, 25-Hydroxy: 42 ng/mL (ref 30–89)

## 2013-06-19 ENCOUNTER — Other Ambulatory Visit: Payer: Self-pay | Admitting: Family Medicine

## 2013-06-19 ENCOUNTER — Telehealth: Payer: Self-pay | Admitting: Family Medicine

## 2013-06-19 MED ORDER — METFORMIN HCL 1000 MG PO TABS: 1000.0000 mg | ORAL_TABLET | Freq: Two times a day (BID) | ORAL | Status: DC

## 2013-06-19 MED ORDER — LISINOPRIL 10 MG PO TABS: 10.0000 mg | ORAL_TABLET | Freq: Every day | ORAL | Status: DC

## 2013-06-19 MED ORDER — LEVOTHYROXINE SODIUM 100 MCG PO TABS: 100.0000 ug | ORAL_TABLET | Freq: Every day | ORAL | Status: DC

## 2013-06-19 NOTE — Telephone Encounter (Signed)
done

## 2013-06-30 ENCOUNTER — Ambulatory Visit: Payer: Medicare Other | Admitting: Family Medicine

## 2013-07-13 ENCOUNTER — Ambulatory Visit (INDEPENDENT_AMBULATORY_CARE_PROVIDER_SITE_OTHER): Payer: Medicare Other | Admitting: Family Medicine

## 2013-07-13 ENCOUNTER — Encounter: Payer: Self-pay | Admitting: Family Medicine

## 2013-07-13 VITALS — BP 120/78 | HR 63 | Ht 64.0 in | Wt 115.0 lb

## 2013-07-13 DIAGNOSIS — Z8711 Personal history of peptic ulcer disease: Secondary | ICD-10-CM

## 2013-07-13 DIAGNOSIS — R1012 Left upper quadrant pain: Secondary | ICD-10-CM

## 2013-07-13 DIAGNOSIS — Z23 Encounter for immunization: Secondary | ICD-10-CM

## 2013-07-13 LAB — CBC WITH DIFFERENTIAL/PLATELET
Basophils Absolute: 0 10*3/uL (ref 0.0–0.1)
Basophils Relative: 0 % (ref 0–1)
HCT: 36.8 % (ref 36.0–46.0)
Hemoglobin: 12.7 g/dL (ref 12.0–15.0)
Lymphocytes Relative: 37 % (ref 12–46)
MCHC: 34.5 g/dL (ref 30.0–36.0)
Monocytes Absolute: 0.8 10*3/uL (ref 0.1–1.0)
Monocytes Relative: 9 % (ref 3–12)
Neutro Abs: 5.1 10*3/uL (ref 1.7–7.7)
Neutrophils Relative %: 53 % (ref 43–77)
Platelets: 250 10*3/uL (ref 150–400)
WBC: 9.6 10*3/uL (ref 4.0–10.5)

## 2013-07-13 LAB — HEMOCCULT GUIAC POC 1CARD (OFFICE)

## 2013-07-13 NOTE — Progress Notes (Signed)
  Subjective:    Patient ID: Deborah Travis, female    DOB: 08-23-1941, 72 y.o.   MRN: [id number]  HPI She has a three-day history of left upper quadrant pain radiating into the mid epigastric area with burning sensation. The pain is constant. Nothing in particular made it worse. She usually has a BM every day. She has skipped a few days. She usually takes Prevacid for indigestion; her symptoms are usually different than the pain she is having. She has a previous history of bleeding ulcers. She states that the pain is similar to her previous ulcer pains . She has no frequency, urgency, nausea or vomiting.   Review of Systems     Objective:   Physical Exam Alert and in no distress. Abdominal exam shows active bowel sounds. She does have some slight left upper and mid quadrant tenderness but no rebound. Stool was guaiac-negative.      Assessment & Plan:  Abdominal pain, left upper quadrant - Plan: CBC with Differential  Need for prophylactic vaccination and inoculation against influenza - Plan: Flu vaccine HIGH DOSE PF (Fluzone Tri High dose)  History of peptic ulcer disease  she was switched to Prilosec since it costs less. I will wait for her blood work before further evaluation. Flu shot given with risks and benefits discussed

## 2013-07-13 NOTE — Patient Instructions (Signed)
Switch to the Prilosec

## 2013-07-17 ENCOUNTER — Encounter: Payer: Self-pay | Admitting: Internal Medicine

## 2013-09-07 ENCOUNTER — Encounter: Payer: Self-pay | Admitting: Family Medicine

## 2013-09-07 ENCOUNTER — Ambulatory Visit (INDEPENDENT_AMBULATORY_CARE_PROVIDER_SITE_OTHER): Payer: Medicare HMO | Admitting: Family Medicine

## 2013-09-07 VITALS — BP 102/66 | HR 72 | Wt 113.0 lb

## 2013-09-07 DIAGNOSIS — E039 Hypothyroidism, unspecified: Secondary | ICD-10-CM

## 2013-09-07 DIAGNOSIS — E1169 Type 2 diabetes mellitus with other specified complication: Secondary | ICD-10-CM

## 2013-09-07 DIAGNOSIS — K219 Gastro-esophageal reflux disease without esophagitis: Secondary | ICD-10-CM

## 2013-09-07 DIAGNOSIS — E119 Type 2 diabetes mellitus without complications: Secondary | ICD-10-CM

## 2013-09-07 DIAGNOSIS — E1159 Type 2 diabetes mellitus with other circulatory complications: Secondary | ICD-10-CM

## 2013-09-07 DIAGNOSIS — E785 Hyperlipidemia, unspecified: Secondary | ICD-10-CM

## 2013-09-07 DIAGNOSIS — I10 Essential (primary) hypertension: Secondary | ICD-10-CM

## 2013-09-07 DIAGNOSIS — I152 Hypertension secondary to endocrine disorders: Secondary | ICD-10-CM

## 2013-09-07 LAB — POCT GLYCOSYLATED HEMOGLOBIN (HGB A1C): HEMOGLOBIN A1C: 6.5

## 2013-09-07 MED ORDER — PRAVASTATIN SODIUM 20 MG PO TABS: ORAL_TABLET | ORAL | Status: DC

## 2013-09-07 MED ORDER — METFORMIN HCL 1000 MG PO TABS: 1000.0000 mg | ORAL_TABLET | Freq: Two times a day (BID) | ORAL | Status: DC

## 2013-09-07 MED ORDER — LEVOTHYROXINE SODIUM 100 MCG PO TABS: 100.0000 ug | ORAL_TABLET | Freq: Every day | ORAL | Status: DC

## 2013-09-07 MED ORDER — LISINOPRIL 10 MG PO TABS: 10.0000 mg | ORAL_TABLET | Freq: Every day | ORAL | Status: DC

## 2013-09-07 NOTE — Progress Notes (Signed)
   Subjective:    Patient ID: Deborah Travis, female    DOB: [birth year], 73 y.o.   MRN: [id number]  HPI She is here for a diabetes recheck. She continues on medications listed in the chart. She does occasionally miss her PPI but is doing well with her reflux symptoms. She does check her blood sugars and they run below 100. She has an eye exam set up within the next month. She checks her feet regularly. Smokes 3 cigarettes per day. Does not drink. She walks her dog 4 times per day. Her immunizations are up-to-date including Shingles. He has no other concerns or complaints. Her last blood work was July and this was reviewed   Review of Systems     Objective:   Physical Exam Page Spiro and in no distress. Hemoglobin A1c is 6.5       Assessment & Plan:  Diabetes mellitus, type 2 - Plan: HgB A1c  Diabetes mellitus - Plan: metFORMIN (GLUCOPHAGE) 1000 MG tablet  Hypothyroidism - Plan: levothyroxine (SYNTHROID, LEVOTHROID) 100 MCG tablet  Hypertension associated with diabetes - Plan: lisinopril (PRINIVIL,ZESTRIL) 10 MG tablet  Hyperlipidemia LDL goal < 70 - Plan: pravastatin (PRAVACHOL) 20 MG tablet  GERD (gastroesophageal reflux disease)

## 2013-10-12 ENCOUNTER — Ambulatory Visit (INDEPENDENT_AMBULATORY_CARE_PROVIDER_SITE_OTHER): Payer: Medicare HMO | Admitting: Family Medicine

## 2013-10-12 ENCOUNTER — Encounter: Payer: Self-pay | Admitting: Family Medicine

## 2013-10-12 VITALS — BP 100/68 | HR 72 | Temp 97.6°F | Wt 116.0 lb

## 2013-10-12 DIAGNOSIS — F17210 Nicotine dependence, cigarettes, uncomplicated: Secondary | ICD-10-CM

## 2013-10-12 DIAGNOSIS — F17200 Nicotine dependence, unspecified, uncomplicated: Secondary | ICD-10-CM

## 2013-10-12 DIAGNOSIS — J209 Acute bronchitis, unspecified: Secondary | ICD-10-CM

## 2013-10-12 DIAGNOSIS — Z87891 Personal history of nicotine dependence: Secondary | ICD-10-CM | POA: Insufficient documentation

## 2013-10-12 MED ORDER — AMOXICILLIN 875 MG PO TABS: 875.0000 mg | ORAL_TABLET | Freq: Two times a day (BID) | ORAL | Status: DC

## 2013-10-12 MED ORDER — AMOXICILLIN 875 MG PO TABS
875.0000 mg | ORAL_TABLET | Freq: Two times a day (BID) | ORAL | Status: DC
Start: 2013-10-12 — End: 2013-10-12

## 2013-10-12 NOTE — Progress Notes (Signed)
   Subjective:    Patient ID: Deborah Travis, female    DOB: 12/21/1940, 73 y.o.   MRN: [id number]  HPI She has a two-week history of rhinorrhea, sore throat but no fever, chills, earache. She developed a cough several days after that. She complains of chest congestion with occasionally productive cough, malaise and fatigue. She smokes 3 cigarettes per day. She has no allergies. Her diabetes is going well.   Review of Systems     Objective:   Physical Exam alert and in no distress. Tympanic membranes and canals are normal. Throat is clear. Tonsils are normal. Neck is supple without adenopathy or thyromegaly. Cardiac exam shows a regular sinus rhythm without murmurs or gallops. Lungs are clear to auscultation.        Assessment & Plan:  Light smoker  Acute bronchitis - Plan: DISCONTINUED: amoxicillin (AMOXIL) 875 MG tablet, DISCONTINUED: amoxicillin (AMOXIL) 875 MG tablet  She smokes only 3 cigarettes per day so I didn't see too much to her about that. I will give her Amoxil and she is to call me if not entirely back to normal.

## 2013-10-12 NOTE — Patient Instructions (Signed)
Use NyQuil for the nighttime coughing and during the day try Robitussin-DM

## 2013-12-08 LAB — HM COLONOSCOPY

## 2013-12-11 LAB — HM DIABETES EYE EXAM

## 2013-12-16 ENCOUNTER — Encounter: Payer: Self-pay | Admitting: Family Medicine

## 2013-12-16 ENCOUNTER — Ambulatory Visit (INDEPENDENT_AMBULATORY_CARE_PROVIDER_SITE_OTHER): Payer: Medicare HMO | Admitting: Family Medicine

## 2013-12-16 ENCOUNTER — Other Ambulatory Visit: Payer: Self-pay | Admitting: Family Medicine

## 2013-12-16 VITALS — BP 102/68 | HR 64 | Temp 98.2°F | Ht 64.0 in | Wt 116.0 lb

## 2013-12-16 DIAGNOSIS — L0291 Cutaneous abscess, unspecified: Secondary | ICD-10-CM

## 2013-12-16 DIAGNOSIS — L0390 Cellulitis, unspecified: Secondary | ICD-10-CM

## 2013-12-16 MED ORDER — AMOXICILLIN-POT CLAVULANATE 875-125 MG PO TABS: 1.0000 | ORAL_TABLET | Freq: Two times a day (BID) | ORAL | Status: DC

## 2013-12-16 NOTE — Progress Notes (Signed)
   Subjective:    Patient ID: Deborah Travis, female    DOB: 09-03-1940, 73 y.o.   MRN: [id number]  HPI  Msr. Deborah Travis is a very pleasant 73 y.o. yo female who  has a past medical history of Hypertension; Diabetes mellitus; GERD (gastroesophageal reflux disease); Vitamin D deficiency; Dyslipidemia; Chronic kidney disease; Duodenal ulcer; Thyroid disease; Chronic pancreatitis; and Osteopenia. She presents today for a dog scratch.  Five days ago the patient was accidentally scratched by her granddaughters dog who is up to date on all of his shots and immunizations. At the time of the accident the wound bled heavily but eventually stopped. The patient feels that the wound is enlarging and becoming more tender. The patients describes the pain as throbbing. The patient denies any fever or chills.   Review of Systems is negative except per HPI.     Objective:   Physical Exam  Left Forearm: 10x10 fluctuant fluid filled pocket directly underneath the skin. Central 2x3 scratch with greenish crusting. The area is swollen, erythematous and very tender to palpation.    Assessment & Plan:  Cellulitis - Plan: DISCONTINUED: amoxicillin-clavulanate (AUGMENTIN) 875-125 MG per tablet  The patient should continue to observe the area for worsening or continued enlargement. To return if the area does not begin to clear up.

## 2013-12-16 NOTE — Progress Notes (Deleted)
   Subjective:    Patient ID: ELONA YINGER, female    DOB: 1941/01/23, 73 y.o.   MRN: [id number]  HPI  Msr. NAASIA WEILBACHER is a very pleasant 73 y.o. yo female who  has a past medical history of Hypertension; Diabetes mellitus; GERD (gastroesophageal reflux disease); Vitamin D deficiency; Dyslipidemia; Chronic kidney disease; Duodenal ulcer; Thyroid disease; Chronic pancreatitis; and Osteopenia. She presents today for a dog scratch.  Five days ago the patient was accidentally scratched by her granddaughters dog who is up to date on all of his shots and immunizations. At the time of the accident the wound bled heavily but eventually stopped. The patient feels that the wound is enlarging and becoming more tender. The patients describes the pain as throbbing. The patient denies any fever or chills.   Review of Systems is negative except per HPI.     Objective:   Physical Exam  Left Forearm: 10x10 fluctuant fluid filled pocket directly underneath the skin. Central 2x3 scratch with greenish crusting. The area is swollen, erythematous and very tender to palpation.    Assessment & Plan:  Cellulitis - Plan: amoxicillin-clavulanate (AUGMENTIN) 875-125 MG per tablet  The patient should continue to observe the area for worsening or continued enlargement. Please return if the area does not begin to clear up.

## 2013-12-22 ENCOUNTER — Encounter: Payer: Self-pay | Admitting: Family Medicine

## 2013-12-23 ENCOUNTER — Encounter: Payer: Self-pay | Admitting: Internal Medicine

## 2014-01-05 ENCOUNTER — Ambulatory Visit (INDEPENDENT_AMBULATORY_CARE_PROVIDER_SITE_OTHER): Payer: Medicare HMO | Admitting: Family Medicine

## 2014-01-05 ENCOUNTER — Encounter: Payer: Self-pay | Admitting: Family Medicine

## 2014-01-05 VITALS — BP 108/62 | HR 60 | Wt 115.0 lb

## 2014-01-05 DIAGNOSIS — E039 Hypothyroidism, unspecified: Secondary | ICD-10-CM

## 2014-01-05 DIAGNOSIS — E1159 Type 2 diabetes mellitus with other circulatory complications: Secondary | ICD-10-CM

## 2014-01-05 DIAGNOSIS — E119 Type 2 diabetes mellitus without complications: Secondary | ICD-10-CM

## 2014-01-05 DIAGNOSIS — E785 Hyperlipidemia, unspecified: Secondary | ICD-10-CM

## 2014-01-05 DIAGNOSIS — F17200 Nicotine dependence, unspecified, uncomplicated: Secondary | ICD-10-CM

## 2014-01-05 DIAGNOSIS — I10 Essential (primary) hypertension: Secondary | ICD-10-CM

## 2014-01-05 DIAGNOSIS — F17210 Nicotine dependence, cigarettes, uncomplicated: Secondary | ICD-10-CM

## 2014-01-05 DIAGNOSIS — E1169 Type 2 diabetes mellitus with other specified complication: Secondary | ICD-10-CM

## 2014-01-05 DIAGNOSIS — I152 Hypertension secondary to endocrine disorders: Secondary | ICD-10-CM

## 2014-01-05 LAB — POCT GLYCOSYLATED HEMOGLOBIN (HGB A1C): Hemoglobin A1C: 6.6

## 2014-01-05 NOTE — Patient Instructions (Signed)
Stop the insulin entirely and keep checking your blood sugars and if they start to creep up let me know

## 2014-01-05 NOTE — Progress Notes (Deleted)
   Subjective:    Patient ID: Deborah Travis, female    DOB: 1940/10/25, 73 y.o.   MRN: [id number]  HPI    Review of Systems     Objective:   Physical Exam        Assessment & Plan:

## 2014-01-05 NOTE — Progress Notes (Signed)
   Subjective:    Patient ID: Deborah Travis, female    DOB: [birth year], 73 y.o.   MRN: [id number]  [name] is a very pleasant 73 y.o. yo female who  has a past medical history of Hypertension; Diabetes mellitus; GERD (gastroesophageal reflux disease); Vitamin D deficiency; Dyslipidemia; Chronic kidney disease; Thyroid disease; Chronic pancreatitis; and Osteopenia. She presents today for a diabetes follow up.   HPI  The patient is doing well overall and has no acute complaints today. The patient checks her blood sugars twice a day normally morning and the evening before dinner. They are generally around 90s.   In terms of diet, the patient reports trying to eat healthy and she also reports walking 1 mile a day.   The patient smokes 3 cigarettes a day, and drinks alcohol never. The patient reports no new lesions, burning or tingling in his feet or legs, and had his most recent eye check was two months ago.   The patient is using her insulin shot at night before supper if her number is above 125. The patient has been on an insulin prn regimen for 6 years.   Review of Systems is negative except per HPI.    Objective:   Physical Exam  Constitutional: Patient is oriented to person, place, and time and well-developed, well-nourished, and in no distress. Eyes: Conjunctivae and EOM are normal. Pupils are equal, round, and reactive to light.  Cardiovascular: Normal rate, regular rhythm and intact distal pulses. Pulmonary/Chest: Effort normal and breath sounds normal.  Neurological: Vibratory, positional and monofilament sensation normal in feet bilaterally.    Assessment & Plan:  Diabetes mellitus - Plan: POCT glycosylated hemoglobin (Hb A1C)  Light smoker  Hypothyroidism  Hypertension associated with diabetes  Hyperlipidemia LDL goal < 70  Will track down the patient's recent DEXA and Colonoscopy reports. Also advised the patient to discontinue her insulin, as a once a week  dosing schedule is likely not impacting her overall blood sugar control.

## 2014-01-12 ENCOUNTER — Encounter: Payer: Self-pay | Admitting: Internal Medicine

## 2014-01-15 ENCOUNTER — Other Ambulatory Visit: Payer: Self-pay | Admitting: Family Medicine

## 2014-01-26 ENCOUNTER — Encounter: Payer: Self-pay | Admitting: Family Medicine

## 2014-04-26 ENCOUNTER — Telehealth: Payer: Self-pay | Admitting: Family Medicine

## 2014-04-26 ENCOUNTER — Other Ambulatory Visit: Payer: Self-pay

## 2014-04-26 MED ORDER — GLUCOSE BLOOD VI STRP: ORAL_STRIP | Status: DC

## 2014-04-26 NOTE — Telephone Encounter (Signed)
DONE

## 2014-04-26 NOTE — Telephone Encounter (Signed)
SENT TEST STRIPS IN PER PT REQUEST

## 2014-05-12 ENCOUNTER — Ambulatory Visit (INDEPENDENT_AMBULATORY_CARE_PROVIDER_SITE_OTHER): Payer: Commercial Managed Care - HMO | Admitting: Family Medicine

## 2014-05-12 ENCOUNTER — Encounter: Payer: Self-pay | Admitting: Family Medicine

## 2014-05-12 VITALS — BP 104/60 | HR 64 | Wt 116.0 lb

## 2014-05-12 DIAGNOSIS — J0100 Acute maxillary sinusitis, unspecified: Secondary | ICD-10-CM

## 2014-05-12 DIAGNOSIS — E785 Hyperlipidemia, unspecified: Secondary | ICD-10-CM

## 2014-05-12 DIAGNOSIS — J209 Acute bronchitis, unspecified: Secondary | ICD-10-CM

## 2014-05-12 DIAGNOSIS — E119 Type 2 diabetes mellitus without complications: Secondary | ICD-10-CM | POA: Diagnosis not present

## 2014-05-12 DIAGNOSIS — E1169 Type 2 diabetes mellitus with other specified complication: Secondary | ICD-10-CM

## 2014-05-12 DIAGNOSIS — I10 Essential (primary) hypertension: Secondary | ICD-10-CM | POA: Diagnosis not present

## 2014-05-12 DIAGNOSIS — I152 Hypertension secondary to endocrine disorders: Secondary | ICD-10-CM

## 2014-05-12 DIAGNOSIS — F17200 Nicotine dependence, unspecified, uncomplicated: Secondary | ICD-10-CM | POA: Diagnosis not present

## 2014-05-12 DIAGNOSIS — E1159 Type 2 diabetes mellitus with other circulatory complications: Secondary | ICD-10-CM

## 2014-05-12 DIAGNOSIS — F17210 Nicotine dependence, cigarettes, uncomplicated: Secondary | ICD-10-CM

## 2014-05-12 LAB — POCT GLYCOSYLATED HEMOGLOBIN (HGB A1C): Hemoglobin A1C: 6.4

## 2014-05-12 MED ORDER — AMOXICILLIN 875 MG PO TABS: 875.0000 mg | ORAL_TABLET | Freq: Two times a day (BID) | ORAL | Status: DC

## 2014-05-12 NOTE — Progress Notes (Signed)
Subjective:    Deborah Travis is a 73 y.o. female who presents for follow-up of Type 2 diabetes mellitus.  He has a one-month history of slight sore throat followed by a dry cough that has now become productive, chest congestion, nasal congestion, purulent nasal drainage with PND. She smokes 3 cigarettes per day  Home blood sugar records: PT TEST BID BUT SHE IS WAITING ON NEW METER  Current symptoms/problems NONE Daily foot checks:    Any foot concerns: NONE Last eye exam:  12/11/13 [facility]   Medication compliance: Excellent Current diet: NONE Current exercise: WALKING 30 MIN 7 DAYS A WEEK Known diabetic complications: none Cardiovascular risk factors: advanced age (older than 1 for men, 28 for women), diabetes mellitus, dyslipidemia and smoking/ tobacco exposure   The following portions of the patient's history were reviewed and updated as appropriate: allergies, current medications, past family history, past medical history, past social history, past surgical history and problem list.  ROS as in subjective above    Objective:    General appearence: alert, no distress, WD/WN. Nasal mucosa is normal but tender over frontal and maxillary sinuses. Neck: supple, no lymphadenopathy, no thyromegaly, no masses Heart: RRR, normal S1, S2, no murmurs Lungs: CTA bilaterally, no wheezes, rhonchi, or rales Abdomen: +bs, soft, non tender, non distended, no masses, no hepatomegaly, no splenomegaly Pulses: 2+ symmetric, upper and lower extremities, normal cap refill Ext: no edema   Lab Review Lab Results  Component Value Date   HGBA1C 6.6% 01/05/2014   Lab Results  Component Value Date   CHOL 108 02/24/2013   HDL 51 02/24/2013   LDLCALC 37 02/24/2013   TRIG 102 02/24/2013   CHOLHDL 2.1 02/24/2013   No results found for this basenameDerl Barrow     Chemistry      Component Value Date/Time   NA 141 02/24/2013 0919   K 4.4 02/24/2013 0919   CL 111 02/24/2013 0919   CO2 24 02/24/2013  0919   BUN 11 02/24/2013 0919   CREATININE 0.85 02/24/2013 0919   CREATININE 1.06 01/17/2012 0518      Component Value Date/Time   CALCIUM 9.2 02/24/2013 0919   ALKPHOS 94 02/24/2013 0919   AST 24 02/24/2013 0919   ALT 27 02/24/2013 0919   BILITOT 0.4 02/24/2013 0919        Chemistry      Component Value Date/Time   NA 141 02/24/2013 0919   K 4.4 02/24/2013 0919   CL 111 02/24/2013 0919   CO2 24 02/24/2013 0919   BUN 11 02/24/2013 0919   CREATININE 0.85 02/24/2013 0919   CREATININE 1.06 01/17/2012 0518      Component Value Date/Time   CALCIUM 9.2 02/24/2013 0919   ALKPHOS 94 02/24/2013 0919   AST 24 02/24/2013 0919   ALT 27 02/24/2013 0919   BILITOT 0.4 02/24/2013 0919       Hemoglobin A1c 6.4  Assessment:  Type 2 diabetes mellitus without complication - Plan: POCT glycosylated hemoglobin (Hb A1C), CBC with Differential, Comprehensive metabolic panel, Lipid panel, CANCELED: POCT UA - Microalbumin  Light smoker  Hypertension associated with diabetes - Plan: CBC with Differential, Comprehensive metabolic panel  Hyperlipidemia with target LDL less than 70 - Plan: Lipid panel  Acute bronchitis, unspecified organism - Plan: amoxicillin (AMOXIL) 875 MG tablet  Acute maxillary sinusitis, recurrence not specified - Plan: amoxicillin (AMOXIL) 875 MG tablet  she will call if not entirely better after she finishes her antibiotic.  Plan:    1.  Rx changes: none 2.  Education: Reviewed 'ABCs' of diabetes management (respective goals in parentheses):  A1C (<7), blood pressure (<130/80), and cholesterol (LDL <100). 3.  Compliance at present is estimated to be good. Efforts to improve compliance (if necessary) will be directed at None. 4. Follow up: 4 months

## 2014-05-12 NOTE — Patient Instructions (Signed)
Take all the antibiotic and use NyQuil. If you not totally back to normal when you finish let me know

## 2014-05-13 LAB — COMPREHENSIVE METABOLIC PANEL
ALBUMIN: 4.1 g/dL (ref 3.5–5.2)
ALT: 30 U/L (ref 0–35)
AST: 29 U/L (ref 0–37)
Alkaline Phosphatase: 93 U/L (ref 39–117)
BUN: 10 mg/dL (ref 6–23)
CHLORIDE: 108 meq/L (ref 96–112)
CO2: 21 mEq/L (ref 19–32)
CREATININE: 0.93 mg/dL (ref 0.50–1.10)
Calcium: 9.3 mg/dL (ref 8.4–10.5)
Glucose, Bld: 108 mg/dL — ABNORMAL HIGH (ref 70–99)
POTASSIUM: 4.2 meq/L (ref 3.5–5.3)
Sodium: 138 mEq/L (ref 135–145)
Total Bilirubin: 0.4 mg/dL (ref 0.2–1.2)
Total Protein: 6.7 g/dL (ref 6.0–8.3)

## 2014-05-13 LAB — LIPID PANEL
CHOL/HDL RATIO: 2 ratio
Cholesterol: 104 mg/dL (ref 0–200)
HDL: 53 mg/dL (ref >39)
LDL Cholesterol: 29 mg/dL (ref 0–99)
Triglycerides: 112 mg/dL (ref <150)
VLDL: 22 mg/dL (ref 0–40)

## 2014-05-13 LAB — CBC WITH DIFFERENTIAL/PLATELET
BASOS ABS: 0 10*3/uL (ref 0.0–0.1)
Basophils Relative: 0 % (ref 0–1)
Eosinophils Absolute: 0.1 10*3/uL (ref 0.0–0.7)
Eosinophils Relative: 1 % (ref 0–5)
HCT: 39.8 % (ref 36.0–46.0)
Hemoglobin: 13.8 g/dL (ref 12.0–15.0)
LYMPHS PCT: 33 % (ref 12–46)
Lymphs Abs: 2.8 10*3/uL (ref 0.7–4.0)
MCH: 32.2 pg (ref 26.0–34.0)
MCHC: 34.7 g/dL (ref 30.0–36.0)
MCV: 93 fL (ref 78.0–100.0)
Monocytes Absolute: 0.9 10*3/uL (ref 0.1–1.0)
Monocytes Relative: 10 % (ref 3–12)
Neutro Abs: 4.8 10*3/uL (ref 1.7–7.7)
Neutrophils Relative %: 56 % (ref 43–77)
PLATELETS: 265 10*3/uL (ref 150–400)
RBC: 4.28 MIL/uL (ref 3.87–5.11)
RDW: 14.7 % (ref 11.5–15.5)
WBC: 8.6 10*3/uL (ref 4.0–10.5)

## 2014-05-21 ENCOUNTER — Telehealth: Payer: Self-pay | Admitting: Internal Medicine

## 2014-05-21 MED ORDER — AMOXICILLIN-POT CLAVULANATE 875-125 MG PO TABS: 1.0000 | ORAL_TABLET | Freq: Two times a day (BID) | ORAL | Status: DC

## 2014-05-21 NOTE — Telephone Encounter (Signed)
Pt states that she still has chest congestion and still the same as before she started medicine. She would like something else sent to [facility]. She can not use zpax as it does not work for her

## 2014-05-21 NOTE — Telephone Encounter (Signed)
Tell her that I called a new medicine in for her.

## 2014-05-21 NOTE — Telephone Encounter (Signed)
CALLED PT SHE WAS INFORMED NEW MED CALLED IN

## 2014-05-28 ENCOUNTER — Telehealth: Payer: Self-pay | Admitting: Family Medicine

## 2014-05-28 ENCOUNTER — Other Ambulatory Visit: Payer: Self-pay | Admitting: Family Medicine

## 2014-05-28 MED ORDER — BLOOD GLUCOSE METER KIT: PACK | Status: DC

## 2014-05-28 NOTE — Telephone Encounter (Signed)
I sent the Rx into [facility] pharmacy for her meter only.

## 2014-06-07 ENCOUNTER — Other Ambulatory Visit: Payer: Self-pay

## 2014-06-07 ENCOUNTER — Telehealth: Payer: Self-pay | Admitting: Internal Medicine

## 2014-06-07 MED ORDER — BD SWABS SINGLE USE BUTTERFLY PADS: 1.0000 | MEDICATED_PAD | Freq: Two times a day (BID) | Status: DC

## 2014-06-07 NOTE — Telephone Encounter (Signed)
Refill request for accu-check aviva solution, bd single use swab to [facility]

## 2014-06-07 NOTE — Telephone Encounter (Signed)
DONE

## 2014-07-05 ENCOUNTER — Encounter: Payer: Self-pay | Admitting: Internal Medicine

## 2014-08-31 ENCOUNTER — Other Ambulatory Visit: Payer: Self-pay

## 2014-08-31 ENCOUNTER — Telehealth: Payer: Self-pay | Admitting: Internal Medicine

## 2014-08-31 DIAGNOSIS — I152 Hypertension secondary to endocrine disorders: Secondary | ICD-10-CM

## 2014-08-31 DIAGNOSIS — I10 Essential (primary) hypertension: Principal | ICD-10-CM

## 2014-08-31 DIAGNOSIS — E1159 Type 2 diabetes mellitus with other circulatory complications: Secondary | ICD-10-CM

## 2014-08-31 MED ORDER — LISINOPRIL 10 MG PO TABS: 10.0000 mg | ORAL_TABLET | Freq: Every day | ORAL | Status: DC

## 2014-08-31 MED ORDER — METFORMIN HCL 1000 MG PO TABS: 1000.0000 mg | ORAL_TABLET | Freq: Two times a day (BID) | ORAL | Status: DC

## 2014-08-31 MED ORDER — LEVOTHYROXINE SODIUM 100 MCG PO TABS: 100.0000 ug | ORAL_TABLET | Freq: Every day | ORAL | Status: DC

## 2014-08-31 MED ORDER — PRAVASTATIN SODIUM 20 MG PO TABS: ORAL_TABLET | ORAL | Status: DC

## 2014-08-31 NOTE — Telephone Encounter (Signed)
Pt needs a refill on her levothyroxine,pravastatin, metformin, lisinopril #90 to [facility]

## 2014-08-31 NOTE — Telephone Encounter (Signed)
done

## 2014-09-16 ENCOUNTER — Encounter: Payer: Self-pay | Admitting: Family Medicine

## 2014-09-16 ENCOUNTER — Ambulatory Visit (INDEPENDENT_AMBULATORY_CARE_PROVIDER_SITE_OTHER): Payer: Commercial Managed Care - HMO | Admitting: Family Medicine

## 2014-09-16 VITALS — BP 110/70 | HR 70 | Wt 114.0 lb

## 2014-09-16 DIAGNOSIS — E1159 Type 2 diabetes mellitus with other circulatory complications: Secondary | ICD-10-CM

## 2014-09-16 DIAGNOSIS — I152 Hypertension secondary to endocrine disorders: Secondary | ICD-10-CM

## 2014-09-16 DIAGNOSIS — J069 Acute upper respiratory infection, unspecified: Secondary | ICD-10-CM

## 2014-09-16 DIAGNOSIS — E785 Hyperlipidemia, unspecified: Secondary | ICD-10-CM

## 2014-09-16 DIAGNOSIS — Z720 Tobacco use: Secondary | ICD-10-CM

## 2014-09-16 DIAGNOSIS — I10 Essential (primary) hypertension: Secondary | ICD-10-CM

## 2014-09-16 DIAGNOSIS — E119 Type 2 diabetes mellitus without complications: Secondary | ICD-10-CM

## 2014-09-16 DIAGNOSIS — E039 Hypothyroidism, unspecified: Secondary | ICD-10-CM

## 2014-09-16 DIAGNOSIS — E1169 Type 2 diabetes mellitus with other specified complication: Secondary | ICD-10-CM

## 2014-09-16 DIAGNOSIS — F17210 Nicotine dependence, cigarettes, uncomplicated: Secondary | ICD-10-CM

## 2014-09-16 LAB — POCT UA - MICROALBUMIN
Albumin/Creatinine Ratio, Urine, POC: 10.9
CREATININE, POC: 48.7 mg/dL
MICROALBUMIN (UR) POC: 5.3 mg/L

## 2014-09-16 LAB — POCT GLYCOSYLATED HEMOGLOBIN (HGB A1C): HEMOGLOBIN A1C: 6.4

## 2014-09-16 NOTE — Progress Notes (Signed)
  Subjective:    Deborah Travis is a 74 y.o. female who presents for follow-up of Type 2 diabetes mellitus.  She also has a three-week history of productive cough, chest congestion, slight shortness of breath but no fever, chills, sore throat or earache. The symptoms are improving. She does use NyQuil regularly. She still smokes 3 cigarettes per day.  Home blood sugar records: patient test bid   Current symptoms/problems NONE Daily foot checks:  Any foot concerns: NONE Last eye exam:12/11/2013     Medication compliance:good Current diet: none Current exercise: walking for 1 hr a day Known diabetic complications: none Cardiovascular risk factors: advanced age (older than 24 for men, 11 for women), diabetes mellitus, dyslipidemia, hypertension and smoking/ tobacco exposure    ROS as in subjective above    Objective:   General appearence: alert, no distress, WD/WN Neck: supple, no lymphadenopathy, no thyromegaly, no masses Heart: RRR, normal S1, S2, no murmurs Lungs: CTA bilaterally, no wheezes, rhonchi, or rales Abdomen: +bs, soft, non tender, non distended, no masses, no hepatomegaly, no splenomegaly Pulses: 2+ symmetric, upper and lower extremities, normal cap refill Ext: no edema  Lab Review Lab Results  Component Value Date   HGBA1C 6.4 05/12/2014   Lab Results  Component Value Date   CHOL 104 05/12/2014   HDL 53 05/12/2014   LDLCALC 29 05/12/2014   TRIG 112 05/12/2014   CHOLHDL 2.0 05/12/2014   No results found for: Derl Barrow   Chemistry      Component Value Date/Time   NA 138 05/12/2014 0923   K 4.2 05/12/2014 0923   CL 108 05/12/2014 0923   CO2 21 05/12/2014 0923   BUN 10 05/12/2014 0923   CREATININE 0.93 05/12/2014 0923   CREATININE 1.06 01/17/2012 0518      Component Value Date/Time   CALCIUM 9.3 05/12/2014 0923   ALKPHOS 93 05/12/2014 0923   AST 29 05/12/2014 0923   ALT 30 05/12/2014 0923   BILITOT 0.4 05/12/2014 0923        Chemistry       Component Value Date/Time   NA 138 05/12/2014 0923   K 4.2 05/12/2014 0923   CL 108 05/12/2014 0923   CO2 21 05/12/2014 0923   BUN 10 05/12/2014 0923   CREATININE 0.93 05/12/2014 0923   CREATININE 1.06 01/17/2012 0518      Component Value Date/Time   CALCIUM 9.3 05/12/2014 0923   ALKPHOS 93 05/12/2014 0923   AST 29 05/12/2014 0923   ALT 30 05/12/2014 0923   BILITOT 0.4 05/12/2014 0923      Hemoglobin A1c 6.4  Assessment:  Type 2 diabetes mellitus without complication - Plan: POCT glycosylated hemoglobin (Hb A1C), POCT UA - Microalbumin  Hypertension associated with diabetes  Hyperlipidemia associated with type 2 diabetes mellitus  Light smoker  Hypothyroidism, unspecified hypothyroidism type  Acute URI        Plan:    1.  Rx changes: none 2.  Education: Reviewed 'ABCs' of diabetes management (respective goals in parentheses):  A1C (<7), blood pressure (<130/80), and cholesterol (LDL <100). 3.  Compliance at present is estimated to be good. Efforts to improve compliance (if necessary) will be directed at none. 4. Follow up: 4 months   Continue with supportive care concerning the URI. Encouraged her to continue to take good care of herself. Did recommend cutting back on testing to once per day.

## 2014-09-20 ENCOUNTER — Telehealth: Payer: Self-pay | Admitting: Internal Medicine

## 2014-09-20 NOTE — Telephone Encounter (Signed)
Refill request for accu-chek aviva plus, accu-chek aviva solution, BD single use Swab to [facility]

## 2014-09-21 ENCOUNTER — Other Ambulatory Visit: Payer: Self-pay

## 2014-09-21 MED ORDER — ACCU-CHEK AVIVA VI SOLN: Status: DC

## 2014-09-21 MED ORDER — GLUCOSE BLOOD VI STRP: ORAL_STRIP | Status: DC

## 2014-09-21 MED ORDER — BD SWABS SINGLE USE BUTTERFLY PADS: 1.0000 | MEDICATED_PAD | Freq: Two times a day (BID) | Status: DC

## 2014-09-21 MED ORDER — ACCU-CHEK AVIVA PLUS W/DEVICE KIT: PACK | Status: DC

## 2014-09-21 NOTE — Telephone Encounter (Signed)
done

## 2015-01-25 ENCOUNTER — Ambulatory Visit: Payer: Commercial Managed Care - HMO | Admitting: Family Medicine

## 2015-02-01 ENCOUNTER — Encounter: Payer: Self-pay | Admitting: Family Medicine

## 2015-02-01 ENCOUNTER — Ambulatory Visit (INDEPENDENT_AMBULATORY_CARE_PROVIDER_SITE_OTHER): Payer: Commercial Managed Care - HMO | Admitting: Family Medicine

## 2015-02-01 VITALS — BP 110/70 | HR 66 | Ht 64.0 in | Wt 114.0 lb

## 2015-02-01 DIAGNOSIS — E119 Type 2 diabetes mellitus without complications: Secondary | ICD-10-CM | POA: Diagnosis not present

## 2015-02-01 DIAGNOSIS — Z720 Tobacco use: Secondary | ICD-10-CM | POA: Diagnosis not present

## 2015-02-01 DIAGNOSIS — I152 Hypertension secondary to endocrine disorders: Secondary | ICD-10-CM

## 2015-02-01 DIAGNOSIS — D692 Other nonthrombocytopenic purpura: Secondary | ICD-10-CM | POA: Diagnosis not present

## 2015-02-01 DIAGNOSIS — E1159 Type 2 diabetes mellitus with other circulatory complications: Secondary | ICD-10-CM

## 2015-02-01 DIAGNOSIS — M8580 Other specified disorders of bone density and structure, unspecified site: Secondary | ICD-10-CM

## 2015-02-01 DIAGNOSIS — I10 Essential (primary) hypertension: Secondary | ICD-10-CM | POA: Diagnosis not present

## 2015-02-01 DIAGNOSIS — E038 Other specified hypothyroidism: Secondary | ICD-10-CM | POA: Diagnosis not present

## 2015-02-01 DIAGNOSIS — K219 Gastro-esophageal reflux disease without esophagitis: Secondary | ICD-10-CM

## 2015-02-01 DIAGNOSIS — M7711 Lateral epicondylitis, right elbow: Secondary | ICD-10-CM

## 2015-02-01 DIAGNOSIS — E1169 Type 2 diabetes mellitus with other specified complication: Secondary | ICD-10-CM

## 2015-02-01 DIAGNOSIS — E785 Hyperlipidemia, unspecified: Secondary | ICD-10-CM

## 2015-02-01 DIAGNOSIS — Z23 Encounter for immunization: Secondary | ICD-10-CM | POA: Diagnosis not present

## 2015-02-01 DIAGNOSIS — F17210 Nicotine dependence, cigarettes, uncomplicated: Secondary | ICD-10-CM

## 2015-02-01 LAB — POCT GLYCOSYLATED HEMOGLOBIN (HGB A1C): HEMOGLOBIN A1C: 6.4

## 2015-02-01 NOTE — Progress Notes (Signed)
  Subjective:    Patient ID: Deborah Travis, female    DOB: 01/01/1941, 74 y.o.   MRN: [id number]  Deborah Travis is a 74 y.o. female who presents for follow-up of Type 2 diabetes mellitus.She also complains of right lateral elbow discomfort.She continues to smoke 3 cigarettes per day.Her reflux gets her very little difficulty. Home blood sugar records: Patient test one time a day Current symptoms/problems none Daily foot checks: yes  Any foot concerns: none Exercise: walking Half hour every day streching Eyes:12/11/2013 The following portions of the patient's history were reviewed and updated as appropriate: allergies, current medications, past medical history, past social history and problem list.  ROS as in subjective above.     Objective:    Physical Exam Alert and in no distress otherwise not examined.  Lab Review Diabetic Labs Latest Ref Rng 09/16/2014 05/12/2014 01/05/2014 09/07/2013 02/24/2013  HbA1c - 6.4 6.4 6.6% 6.5 6.3  Chol 0 - 200 mg/dL - 104 - - 108  HDL >39 mg/dL - 53 - - 51  Calc LDL 0 - 99 mg/dL - 29 - - 37  Triglycerides <150 mg/dL - 112 - - 102  Creatinine 0.50 - 1.10 mg/dL - 0.93 - - 0.85   BP/Weight 09/16/2014 05/12/2014 01/05/2014 12/16/2013 12/03/8117  Systolic BP 147 829 562 130 865  Diastolic BP 70 60 62 68 68  Wt. (Lbs) 114 116 115 116 116  BMI 19.56 19.9 19.73 19.9 19.9   Foot/eye exam completion dates Latest Ref Rng 12/11/2013 05/07/2013  Eye Exam No Retinopathy No Retinopathy -  Foot Form Completion - - Done  Hemoglobin A1c is 6.4 Tender to palpation over the extensor mechanism of the lateral epicondyle. Exam of both arms does show scattered purpuric lesions. Deborah Travis  reports that she has been smoking.  She has never used smokeless tobacco. She reports that she does not drink alcohol or use illicit drugs.     Assessment & Plan:   Type 2 diabetes mellitus without complication - Plan: POCT glycosylated hemoglobin (Hb A1C)  Senile purpura  Hypertension  associated with diabetes  Hyperlipidemia associated with type 2 diabetes mellitus  Light smoker  Other specified hypothyroidism  Osteopenia  Gastroesophageal reflux disease without esophagitis  Need for prophylactic vaccination against Streptococcus pneumoniae (pneumococcus) - Plan: Pneumococcal conjugate vaccine 13-valent  Lateral epicondylitis (tennis elbow), right   1. Rx changes: none 2. Education: Reviewed 'ABCs' of diabetes management (respective goals in parentheses):  A1C (<7), blood pressure (<130/80), and cholesterol (LDL <100). 3. Compliance at present is estimated to be good. Efforts to improve compliance (if necessary) will be directed at no change. 4. Follow up: 4 months  5. Particular therapy for the forearm bruising. Explained the mechanism behind this. Also recommend she do as many things palms up and open as possible. She will continue on her present medication regimen.

## 2015-02-01 NOTE — Patient Instructions (Signed)
Check your blood sugars 2 hours after some review meals. Do as many things as you can palms up and open

## 2015-02-18 ENCOUNTER — Telehealth: Payer: Self-pay | Admitting: Family Medicine

## 2015-02-18 ENCOUNTER — Other Ambulatory Visit: Payer: Self-pay

## 2015-02-18 MED ORDER — LEVOTHYROXINE SODIUM 100 MCG PO TABS: 100.0000 ug | ORAL_TABLET | Freq: Every day | ORAL | Status: DC

## 2015-02-18 MED ORDER — PRAVASTATIN SODIUM 20 MG PO TABS: ORAL_TABLET | ORAL | Status: DC

## 2015-02-18 NOTE — Telephone Encounter (Signed)
Sent meds in

## 2015-02-18 NOTE — Telephone Encounter (Signed)
Requesting refill on Levothyroxine 131mcg and Pravastatin 20mg 

## 2015-06-01 ENCOUNTER — Encounter: Payer: Self-pay | Admitting: Family Medicine

## 2015-06-01 ENCOUNTER — Ambulatory Visit (INDEPENDENT_AMBULATORY_CARE_PROVIDER_SITE_OTHER): Payer: Commercial Managed Care - HMO | Admitting: Family Medicine

## 2015-06-01 VITALS — BP 118/72 | HR 83 | Ht 63.0 in | Wt 114.0 lb

## 2015-06-01 DIAGNOSIS — I10 Essential (primary) hypertension: Secondary | ICD-10-CM | POA: Diagnosis not present

## 2015-06-01 DIAGNOSIS — E1169 Type 2 diabetes mellitus with other specified complication: Secondary | ICD-10-CM | POA: Diagnosis not present

## 2015-06-01 DIAGNOSIS — E119 Type 2 diabetes mellitus without complications: Secondary | ICD-10-CM

## 2015-06-01 DIAGNOSIS — F17210 Nicotine dependence, cigarettes, uncomplicated: Secondary | ICD-10-CM

## 2015-06-01 DIAGNOSIS — Z23 Encounter for immunization: Secondary | ICD-10-CM

## 2015-06-01 DIAGNOSIS — Z720 Tobacco use: Secondary | ICD-10-CM | POA: Diagnosis not present

## 2015-06-01 DIAGNOSIS — M8580 Other specified disorders of bone density and structure, unspecified site: Secondary | ICD-10-CM | POA: Diagnosis not present

## 2015-06-01 DIAGNOSIS — I152 Hypertension secondary to endocrine disorders: Secondary | ICD-10-CM

## 2015-06-01 DIAGNOSIS — E039 Hypothyroidism, unspecified: Secondary | ICD-10-CM | POA: Diagnosis not present

## 2015-06-01 DIAGNOSIS — E1159 Type 2 diabetes mellitus with other circulatory complications: Secondary | ICD-10-CM | POA: Diagnosis not present

## 2015-06-01 DIAGNOSIS — K219 Gastro-esophageal reflux disease without esophagitis: Secondary | ICD-10-CM

## 2015-06-01 DIAGNOSIS — J208 Acute bronchitis due to other specified organisms: Secondary | ICD-10-CM

## 2015-06-01 DIAGNOSIS — E785 Hyperlipidemia, unspecified: Secondary | ICD-10-CM | POA: Diagnosis not present

## 2015-06-01 LAB — COMPREHENSIVE METABOLIC PANEL
ALK PHOS: 71 U/L (ref 33–130)
ALT: 16 U/L (ref 6–29)
AST: 20 U/L (ref 10–35)
Albumin: 4 g/dL (ref 3.6–5.1)
BUN: 18 mg/dL (ref 7–25)
CALCIUM: 9 mg/dL (ref 8.6–10.4)
CHLORIDE: 108 mmol/L (ref 98–110)
CO2: 20 mmol/L (ref 20–31)
Creat: 1.03 mg/dL — ABNORMAL HIGH (ref 0.60–0.93)
Glucose, Bld: 196 mg/dL — ABNORMAL HIGH (ref 65–99)
POTASSIUM: 4.9 mmol/L (ref 3.5–5.3)
Sodium: 135 mmol/L (ref 135–146)
TOTAL PROTEIN: 6.4 g/dL (ref 6.1–8.1)
Total Bilirubin: 0.4 mg/dL (ref 0.2–1.2)

## 2015-06-01 LAB — CBC WITH DIFFERENTIAL/PLATELET
Basophils Absolute: 0 10*3/uL (ref 0.0–0.1)
Basophils Relative: 0 % (ref 0–1)
EOS PCT: 1 % (ref 0–5)
Eosinophils Absolute: 0.1 10*3/uL (ref 0.0–0.7)
HEMATOCRIT: 38.1 % (ref 36.0–46.0)
Hemoglobin: 12.8 g/dL (ref 12.0–15.0)
LYMPHS ABS: 3.5 10*3/uL (ref 0.7–4.0)
LYMPHS PCT: 37 % (ref 12–46)
MCH: 32 pg (ref 26.0–34.0)
MCHC: 33.6 g/dL (ref 30.0–36.0)
MCV: 95.3 fL (ref 78.0–100.0)
MONOS PCT: 7 % (ref 3–12)
MPV: 9.5 fL (ref 8.6–12.4)
Monocytes Absolute: 0.7 10*3/uL (ref 0.1–1.0)
Neutro Abs: 5.2 10*3/uL (ref 1.7–7.7)
Neutrophils Relative %: 55 % (ref 43–77)
Platelets: 321 10*3/uL (ref 150–400)
RBC: 4 MIL/uL (ref 3.87–5.11)
RDW: 13.8 % (ref 11.5–15.5)
WBC: 9.5 10*3/uL (ref 4.0–10.5)

## 2015-06-01 LAB — LIPID PANEL
CHOL/HDL RATIO: 2 ratio (ref <=5.0)
CHOLESTEROL: 102 mg/dL — AB (ref 125–200)
HDL: 50 mg/dL (ref >=46)
LDL Cholesterol: 28 mg/dL (ref <130)
TRIGLYCERIDES: 119 mg/dL (ref <150)
VLDL: 24 mg/dL (ref <30)

## 2015-06-01 LAB — TSH: TSH: 1.089 u[IU]/mL (ref 0.350–4.500)

## 2015-06-01 LAB — POCT GLYCOSYLATED HEMOGLOBIN (HGB A1C): Hemoglobin A1C: 6.4

## 2015-06-01 MED ORDER — AMOXICILLIN 875 MG PO TABS: 875.0000 mg | ORAL_TABLET | Freq: Two times a day (BID) | ORAL | Status: DC

## 2015-06-01 MED ORDER — METFORMIN HCL 1000 MG PO TABS: 1000.0000 mg | ORAL_TABLET | Freq: Two times a day (BID) | ORAL | Status: DC

## 2015-06-01 NOTE — Progress Notes (Signed)
Subjective:    Patient ID: Deborah Travis, female    DOB: 03/22/1941, 74 y.o.   MRN: [id number]  Deborah Travis is a 74 y.o. female who presents for follow-up of Type 2 diabetes mellitus.  Home blood sugar records: PATIENT TEST BID Current symptoms/problems include NONE Daily foot checks:YES   Any foot concerns: NONE Exercise: WALKING 1 HOUR A DAY EYE: 11/2013 HECKER she plans to set this up a repeat in the next week or 2. She does have a month-long history of coughing especially when she talks. It is intermittently productive. No fever, chills , sore throat shortness of breath , fatigue she does have a history of osteopenia and has not had a DEXA scan as 2012.  Continues on her thyroid medication and is having no difficulty.  The following portions of the patient's history were reviewed and updated as appropriate: allergies, current medications, past medical history, past social history and problem list.  ROS as in subjective above.     Objective:    Physical Exam Alert and in no distress. Tympanic membranes and canals are normal. Pharyngeal area is normal. Neck is supple without adenopathy or thyromegaly. Cardiac exam shows a regular sinus rhythm without murmurs or gallops. Lungs are clear to auscultation.  Lab Review Diabetic Labs Latest Ref Rng 02/01/2015 09/16/2014 05/12/2014 01/05/2014 09/07/2013  HbA1c - 6.4 6.4 6.4 6.6% 6.5  Chol 0 - 200 mg/dL - - 104 - -  HDL >39 mg/dL - - 53 - -  Calc LDL 0 - 99 mg/dL - - 29 - -  Triglycerides <150 mg/dL - - 112 - -  Creatinine 0.50 - 1.10 mg/dL - - 0.93 - -   BP/Weight 02/01/2015 09/16/2014 05/12/2014 01/05/2014 5/95/6387  Systolic BP 564 332 951 884 166  Diastolic BP 70 70 60 62 68  Wt. (Lbs) 114 114 116 115 116  BMI 19.56 19.56 19.9 19.73 19.9   Foot/eye exam completion dates Latest Ref Rng 12/11/2013 05/07/2013  Eye Exam No Retinopathy No Retinopathy -  Foot Form Completion - - Done   Deborah Travis  reports that she has been smoking.  She has never  used smokeless tobacco. She reports that she does not drink alcohol or use illicit drugs.  she smokes 3 cigarettes per day    Assessment & Plan:    Hypertension associated with diabetes ([place]) - Plan: CBC with Differential/Platelet, Comprehensive metabolic panel  Type 2 diabetes mellitus without complication, without long-term current use of insulin ([place]) - Plan: POCT glycosylated hemoglobin (Hb A1C), POCT UA - Microalbumin, CBC with Differential/Platelet, Comprehensive metabolic panel, metFORMIN (GLUCOPHAGE) 1000 MG tablet  Hyperlipidemia associated with type 2 diabetes mellitus ([place]) - Plan: Lipid panel  Light smoker  Hypothyroidism, unspecified hypothyroidism type - Plan: TSH  Gastroesophageal reflux disease without esophagitis  Need for prophylactic vaccination and inoculation against influenza - Plan: Flu vaccine HIGH DOSE PF (Fluzone High dose)  Osteopenia - Plan: DG Bone Density  Acute bronchitis due to other specified organisms - Plan: amoxicillin (AMOXIL) 875 MG tablet   1. Rx changes: Amoxicillin for her acute bronchitis 2. Education: Reviewed 'ABCs' of diabetes management (respective goals in parentheses):  A1C (<7), blood pressure (<130/80), and cholesterol (LDL <100). 3. Compliance at present is estimated to be good. Efforts to improve compliance (if necessary) will be directed at No change. 4. Follow up: 4 months She will call if not entirely better after taking the antibiotic. Also discussed her smoking however since she is only smoking 3,  heart to push this too hard. Over 45 minutes, greater than 50% spent in counseling and coordination of care.

## 2015-06-02 ENCOUNTER — Telehealth: Payer: Self-pay | Admitting: Family Medicine

## 2015-06-02 ENCOUNTER — Other Ambulatory Visit: Payer: Self-pay

## 2015-06-02 DIAGNOSIS — J208 Acute bronchitis due to other specified organisms: Secondary | ICD-10-CM

## 2015-06-02 MED ORDER — PRAVASTATIN SODIUM 20 MG PO TABS: ORAL_TABLET | ORAL | Status: DC

## 2015-06-02 MED ORDER — AMOXICILLIN 875 MG PO TABS: 875.0000 mg | ORAL_TABLET | Freq: Two times a day (BID) | ORAL | Status: DC

## 2015-06-02 MED ORDER — LEVOTHYROXINE SODIUM 100 MCG PO TABS
100.0000 ug | ORAL_TABLET | Freq: Every day | ORAL | Status: DC
Start: 2015-06-02 — End: 2016-05-28

## 2015-06-02 MED ORDER — LISINOPRIL 10 MG PO TABS: 10.0000 mg | ORAL_TABLET | Freq: Every day | ORAL | Status: DC

## 2015-06-02 NOTE — Telephone Encounter (Signed)
DONE

## 2015-06-02 NOTE — Addendum Note (Signed)
Addended by: [name] on: 06/02/2015 07:56 AM   Modules accepted: Orders

## 2015-06-02 NOTE — Telephone Encounter (Signed)
Pt called and said dr Redmond School sent her RX amoxil to [facility], and she [facility] [address]. Charlies Constable was too high and that it would take forever for it to get here, it you could please cancel it and switch it [facility] please,

## 2015-06-03 ENCOUNTER — Ambulatory Visit: Payer: Self-pay | Admitting: Family Medicine

## 2015-06-15 ENCOUNTER — Telehealth: Payer: Self-pay | Admitting: Family Medicine

## 2015-06-15 NOTE — Telephone Encounter (Signed)
Pt finished with antibiotic and is some better but not completely & said you told her to call back if not for another antibiotic and she wants another round of something different [facility] [address]

## 2015-06-16 MED ORDER — AMOXICILLIN-POT CLAVULANATE 875-125 MG PO TABS: 1.0000 | ORAL_TABLET | Freq: Two times a day (BID) | ORAL | Status: DC

## 2015-06-16 NOTE — Telephone Encounter (Signed)
Let her know that I called a different med in

## 2015-10-03 ENCOUNTER — Encounter: Payer: Self-pay | Admitting: Family Medicine

## 2015-10-03 ENCOUNTER — Ambulatory Visit (INDEPENDENT_AMBULATORY_CARE_PROVIDER_SITE_OTHER): Payer: Commercial Managed Care - HMO | Admitting: Family Medicine

## 2015-10-03 VITALS — BP 118/76 | HR 61 | Wt 115.0 lb

## 2015-10-03 DIAGNOSIS — D692 Other nonthrombocytopenic purpura: Secondary | ICD-10-CM | POA: Diagnosis not present

## 2015-10-03 DIAGNOSIS — M8580 Other specified disorders of bone density and structure, unspecified site: Secondary | ICD-10-CM

## 2015-10-03 DIAGNOSIS — E1159 Type 2 diabetes mellitus with other circulatory complications: Secondary | ICD-10-CM

## 2015-10-03 DIAGNOSIS — I152 Hypertension secondary to endocrine disorders: Secondary | ICD-10-CM

## 2015-10-03 DIAGNOSIS — F17210 Nicotine dependence, cigarettes, uncomplicated: Secondary | ICD-10-CM

## 2015-10-03 DIAGNOSIS — Z720 Tobacco use: Secondary | ICD-10-CM | POA: Diagnosis not present

## 2015-10-03 DIAGNOSIS — E039 Hypothyroidism, unspecified: Secondary | ICD-10-CM | POA: Diagnosis not present

## 2015-10-03 DIAGNOSIS — I10 Essential (primary) hypertension: Secondary | ICD-10-CM

## 2015-10-03 NOTE — Patient Instructions (Signed)
Heat for 20 minutes 3 times a day and gentle stretching after that

## 2015-10-03 NOTE — Progress Notes (Signed)
  Subjective:    Patient ID: Deborah Travis, female    DOB: [birth year], 75 y.o.   MRN: [id number]  Deborah Travis is a 75 y.o. female who presents for follow-up of Type 2 diabetes mellitus.  Patient is checking home blood sugars.   Home blood sugar records: BGs consistently in an acceptable range How often is blood sugars being checked: 2 x daily Current symptoms/problems include none and have been stable. Daily foot checks: Yes   Any foot concerns: NO Last eye exam: Due In March Exercise: Walking & Stretching exercise She does have osteopenia and continues on multivitamin and calcium. She continues to smoke and is not interested in quitting. She has less than half a pack per day.She continues on her thyroid supplement. The following portions of the patient's history were reviewed and updated as appropriate: allergies, current medications, past medical history, past social history and problem list.  ROS as in subjective above.     Objective:    Physical Exam Alert and in no distress Purpuric lesions noted on both forearms.  Blood pressure 118/20, pulse 61, weight 115 lb (52.164 kg), SpO2 98 %.  Lab Review Diabetic Labs Latest Ref Rng 06/01/2015 02/01/2015 09/16/2014 05/12/2014 01/05/2014  HbA1c - 6.4 6.4 6.4 6.4 6.6%  Chol 125 - 200 mg/dL 102(L) - - 104 -  HDL >=46 mg/dL 50 - - 53 -  Calc LDL <130 mg/dL 28 - - 29 -  Triglycerides <150 mg/dL 119 - - 112 -  Creatinine 0.60 - 0.93 mg/dL 1.03(H) - - 0.93 -   BP/Weight 10/03/2015 06/01/2015 02/01/2015 09/16/2014 [id number]  Systolic BP 123456 123456 A999333 A999333 123456  Diastolic BP 20 72 70 70 60  Wt. (Lbs) 115 114 114 114 116  BMI 20.38 20.2 19.56 19.56 19.9   Foot/eye exam completion dates Latest Ref Rng 06/01/2015 12/11/2013  Eye Exam No Retinopathy - No Retinopathy  Foot Form Completion - Done -  A1c is 6.5  Anela  reports that she has been smoking.  She has never used smokeless tobacco. She reports that she does not drink alcohol or use illicit  drugs.     Assessment & Plan:    Light smoker  Senile purpura (HCC)  Osteopenia  Hypothyroidism, unspecified hypothyroidism type  Hypertension associated with diabetes ([place])    1. Rx changes: none 2. Education: Reviewed 'ABCs' of diabetes management (respective goals in parentheses):  A1C (<7), blood pressure (<130/80), and cholesterol (LDL <100). 3. Compliance at present is estimated to be excellent. Efforts to improve compliance (if necessary) will be directed at no changes. 4. Follow up: 4 months

## 2015-10-04 LAB — POCT GLYCOSYLATED HEMOGLOBIN (HGB A1C): Hemoglobin A1C: 6.5

## 2015-10-04 NOTE — Addendum Note (Signed)
Addended by: Eliezer Lofts on: 10/04/2015 08:23 AM   Modules accepted: Orders

## 2015-11-01 ENCOUNTER — Encounter: Payer: Self-pay | Admitting: Family Medicine

## 2015-11-01 ENCOUNTER — Ambulatory Visit (INDEPENDENT_AMBULATORY_CARE_PROVIDER_SITE_OTHER): Payer: Commercial Managed Care - HMO | Admitting: Family Medicine

## 2015-11-01 VITALS — BP 116/78 | HR 90 | Temp 98.0°F | Wt 114.0 lb

## 2015-11-01 DIAGNOSIS — F17210 Nicotine dependence, cigarettes, uncomplicated: Secondary | ICD-10-CM

## 2015-11-01 DIAGNOSIS — E118 Type 2 diabetes mellitus with unspecified complications: Secondary | ICD-10-CM | POA: Diagnosis not present

## 2015-11-01 DIAGNOSIS — J0100 Acute maxillary sinusitis, unspecified: Secondary | ICD-10-CM | POA: Diagnosis not present

## 2015-11-01 DIAGNOSIS — J209 Acute bronchitis, unspecified: Secondary | ICD-10-CM | POA: Diagnosis not present

## 2015-11-01 DIAGNOSIS — Z720 Tobacco use: Secondary | ICD-10-CM

## 2015-11-01 MED ORDER — BENZONATATE 100 MG PO CAPS: 200.0000 mg | ORAL_CAPSULE | Freq: Three times a day (TID) | ORAL | Status: DC | PRN

## 2015-11-01 MED ORDER — AMOXICILLIN 875 MG PO TABS: 875.0000 mg | ORAL_TABLET | Freq: Two times a day (BID) | ORAL | Status: DC

## 2015-11-01 NOTE — Progress Notes (Signed)
   Subjective:    Patient ID: Deborah Travis, female    DOB: 1941/01/15, 75 y.o.   MRN: [id number]  HPI She complains of a three-day history of started with malaise and slight sore throat, nasal congestion, rhinorrhea, PND as well as maxillary sinus pressure, right greater than left. The symptoms of gotten much worse and she now has a productive cough with purulent nasal drainage. No fever, chills, ear congestion or pain. She does smoke 3 cigarettes per day. She states that her blood sugars are doing quite well.   Review of Systems     Objective:   Physical Exam Alert and in no distress. Tympanic membranes and canals are normal. Pharyngeal area is normal. Neck is supple without adenopathy or thyromegaly. Cardiac exam shows a regular sinus rhythm without murmurs or gallops. Lungs are clear to auscultation. Nasal mucosa is slightly red with tenderness especially over maxillary sinuses       Assessment & Plan:  Light smoker  Acute maxillary sinusitis, recurrence not specified - Plan: amoxicillin (AMOXIL) 875 MG tablet, benzonatate (TESSALON PERLES) 100 MG capsule  Acute bronchitis, unspecified organism - Plan: amoxicillin (AMOXIL) 875 MG tablet, benzonatate (TESSALON PERLES) 100 MG capsule  Type 2 diabetes mellitus with complication, without long-term current use of insulin (HCC)  She is to call if not entirely better when she finishes the antibiotic.he smokes 3 cigarettes per day so I will leave her alone

## 2015-11-04 ENCOUNTER — Other Ambulatory Visit: Payer: Self-pay | Admitting: Medical

## 2015-11-04 ENCOUNTER — Telehealth: Payer: Self-pay | Admitting: Family Medicine

## 2015-11-04 MED ORDER — HYDROCODONE-HOMATROPINE 5-1.5 MG/5ML PO SYRP: 5.0000 mL | ORAL_SOLUTION | Freq: Three times a day (TID) | ORAL | Status: DC | PRN

## 2015-11-04 MED ORDER — PROMETHAZINE-DM 6.25-15 MG/5ML PO SYRP: 5.0000 mL | ORAL_SOLUTION | Freq: Four times a day (QID) | ORAL | Status: DC | PRN

## 2015-11-04 NOTE — Telephone Encounter (Signed)
Pt says the [employer] are not helping her cough. She has not been able to sleep much at nigh due to coughing. Pt would like to get another med for coughing.

## 2015-11-04 NOTE — Telephone Encounter (Signed)
[facility] [address] [facility] to let us know that Promethazine is on backorder and want to know if Audelia Acton wants to call in something else

## 2015-11-04 NOTE — Telephone Encounter (Signed)
I can write for hycodan but she will have to come by and pick up.   Make sure she wants to do this

## 2015-11-04 NOTE — Telephone Encounter (Signed)
rx ready 

## 2015-11-04 NOTE — Telephone Encounter (Signed)
Pt wants to have a chest xray she thinks she has pneumonia.

## 2015-11-04 NOTE — Telephone Encounter (Signed)
Called pt and informed that rx ready for pick up

## 2015-11-04 NOTE — Telephone Encounter (Signed)
Promethazine DM sent.  She can give this a try

## 2015-11-04 NOTE — Telephone Encounter (Signed)
Called pt and she does want the written script

## 2015-11-07 ENCOUNTER — Telehealth: Payer: Self-pay | Admitting: Family Medicine

## 2015-11-07 NOTE — Telephone Encounter (Signed)
appt made for tomorrow

## 2015-11-07 NOTE — Telephone Encounter (Signed)
Pt states she is very short of breath, everything she dose wipes her out, wants inhal[facility] [address] [facility]

## 2015-11-07 NOTE — Telephone Encounter (Signed)
Have her schedule an appointment so we can evaluate this further

## 2015-11-08 ENCOUNTER — Encounter: Payer: Self-pay | Admitting: Family Medicine

## 2015-11-08 ENCOUNTER — Ambulatory Visit (INDEPENDENT_AMBULATORY_CARE_PROVIDER_SITE_OTHER): Payer: Commercial Managed Care - HMO | Admitting: Family Medicine

## 2015-11-08 ENCOUNTER — Ambulatory Visit
Admission: RE | Admit: 2015-11-08 | Discharge: 2015-11-08 | Disposition: A | Discharge status: Home / Self Care | Payer: Commercial Managed Care - HMO | Source: Ambulatory Visit | Attending: Family Medicine | Admitting: Family Medicine

## 2015-11-08 VITALS — BP 120/72 | HR 65 | Resp 18 | Ht 63.0 in | Wt 113.0 lb

## 2015-11-08 DIAGNOSIS — J209 Acute bronchitis, unspecified: Secondary | ICD-10-CM

## 2015-11-08 DIAGNOSIS — R05 Cough: Secondary | ICD-10-CM | POA: Diagnosis not present

## 2015-11-08 LAB — CBC WITH DIFFERENTIAL/PLATELET
BASOS PCT: 0 % (ref 0–1)
Basophils Absolute: 0 10*3/uL (ref 0.0–0.1)
EOS PCT: 1 % (ref 0–5)
Eosinophils Absolute: 0.1 10*3/uL (ref 0.0–0.7)
HEMATOCRIT: 39.7 % (ref 36.0–46.0)
Hemoglobin: 13.4 g/dL (ref 12.0–15.0)
Lymphocytes Relative: 32 % (ref 12–46)
Lymphs Abs: 3.1 10*3/uL (ref 0.7–4.0)
MCH: 31.8 pg (ref 26.0–34.0)
MCHC: 33.8 g/dL (ref 30.0–36.0)
MCV: 94.1 fL (ref 78.0–100.0)
MONO ABS: 0.9 10*3/uL (ref 0.1–1.0)
MONOS PCT: 9 % (ref 3–12)
MPV: 9.4 fL (ref 8.6–12.4)
Neutro Abs: 5.7 10*3/uL (ref 1.7–7.7)
Neutrophils Relative %: 58 % (ref 43–77)
Platelets: 380 10*3/uL (ref 150–400)
RBC: 4.22 MIL/uL (ref 3.87–5.11)
RDW: 13.6 % (ref 11.5–15.5)
WBC: 9.8 10*3/uL (ref 4.0–10.5)

## 2015-11-08 MED ORDER — LEVOFLOXACIN 500 MG PO TABS
500.0000 mg | ORAL_TABLET | Freq: Every day | ORAL | Status: DC
Start: 2015-11-08 — End: 2015-11-11

## 2015-11-08 NOTE — Progress Notes (Signed)
   Subjective:    Patient ID: Deborah Travis, female    DOB: [birth year], 75 y.o.   MRN: [id number]  HPI She is here for a recheck. Approximately 6 days ago she noted difficulty with congestion, worsening cough and rib pain that became more pronounced on the right lower rib area as well as some shortness of breath. She also quit smoking approximately 6 days ago. No sore throat, earache.   Review of Systems     Objective:   Physical Exam Alert and in no distress. Tympanic membranes and canals are normal. Pharyngeal area is normal. Neck is supple without adenopathy or thyromegaly. Cardiac exam shows a regular sinus rhythm without murmurs or gallops. Lungs show decreased breath sounds over the entire right lung. Chest x-ray shows no changes.       Assessment & Plan:  Acute bronchitis, unspecified organism - Plan: DG Chest 2 View, CBC with Differential/Platelet although her symptoms and exam point towards pneumonia the x-ray is negative. I will still treated with Levaquin. Instructed her to call me if she has more difficulty with chest pain or shortness of breath. Otherwise she will call me on Thursday.

## 2015-11-10 ENCOUNTER — Telehealth: Payer: Self-pay | Admitting: Family Medicine

## 2015-11-10 NOTE — Telephone Encounter (Signed)
Pt called she has taken 3 days worth of the Levaquin and the backs of her legs down to her heels are getting more painful by the minute .  Please advise [phone]

## 2015-11-10 NOTE — Telephone Encounter (Signed)
Her to come in and see me tomorrow

## 2015-11-10 NOTE — Telephone Encounter (Signed)
Set up appointment for tomorrow with Dr. Redmond School for patient at [place], confirmed with patient.

## 2015-11-10 NOTE — Telephone Encounter (Signed)
Make sure she is scheduled for a follow-up appointment tomorrow

## 2015-11-10 NOTE — Telephone Encounter (Signed)
Abbie,  Please handle this 

## 2015-11-10 NOTE — Telephone Encounter (Signed)
Pt called she said you wanted to know how she was feeling.  Sinuses is better, cough bad, shortness of breath, dizzy spells, fatigue, not thinking clearly x 2 yesterday.  Please [phone]

## 2015-11-11 ENCOUNTER — Encounter: Payer: Self-pay | Admitting: Family Medicine

## 2015-11-11 ENCOUNTER — Ambulatory Visit (INDEPENDENT_AMBULATORY_CARE_PROVIDER_SITE_OTHER): Payer: Commercial Managed Care - HMO | Admitting: Family Medicine

## 2015-11-11 VITALS — BP 94/70 | HR 74 | Temp 97.5°F | Ht 63.0 in | Wt 108.0 lb

## 2015-11-11 DIAGNOSIS — J209 Acute bronchitis, unspecified: Secondary | ICD-10-CM | POA: Diagnosis not present

## 2015-11-11 MED ORDER — AMOXICILLIN-POT CLAVULANATE 875-125 MG PO TABS: 1.0000 | ORAL_TABLET | Freq: Two times a day (BID) | ORAL | Status: DC

## 2015-11-11 NOTE — Progress Notes (Signed)
   Subjective:    Patient ID: Deborah Travis, female    DOB: 01-02-1941, 75 y.o.   MRN: [id number]  HPI She is here for recheck. Recent x-ray was negative however clinically I felt as if she had pneumonia and placed her on Levaquin. She started having difficulty with this medicine causing lower extremity tendon pain. She stopped the medication with her last dosing being given yesterday. She is having left difficulty with cough and congestion but states he is roughly only 20% better.   Review of Systems     Objective:   Physical Exam Alert and in no distress. Not tachypneic. Lungs now are clear to auscultation. She does have slight tenderness palpation over the Achilles tendon but negative Homans sign.       Assessment & Plan:  Acute bronchitis, unspecified organism - Plan: amoxicillin-clavulanate (AUGMENTIN) 875-125 MG tablet I will switch her to Augmentin. I explained that the side effects she was having is a known 1 from this class of drug. Cautioned her against any excessive physical activities especially pushing off with her feet. He will call if further difficulties. Also of note is that she will need another pneumonia shot with her next visit

## 2015-12-06 ENCOUNTER — Telehealth: Payer: Self-pay | Admitting: Family Medicine

## 2015-12-06 NOTE — Telephone Encounter (Signed)
Left message that we need to reschedule appt,

## 2016-01-06 ENCOUNTER — Telehealth: Payer: Self-pay | Admitting: Family Medicine

## 2016-01-06 DIAGNOSIS — E119 Type 2 diabetes mellitus without complications: Secondary | ICD-10-CM

## 2016-01-06 MED ORDER — METFORMIN HCL 1000 MG PO TABS: 1000.0000 mg | ORAL_TABLET | Freq: Two times a day (BID) | ORAL | Status: DC

## 2016-01-06 NOTE — Telephone Encounter (Signed)
Pt requesting 90 day supply of Metformin 1000 mg

## 2016-01-06 NOTE — Telephone Encounter (Signed)
done

## 2016-01-17 ENCOUNTER — Encounter: Payer: Self-pay | Admitting: Family Medicine

## 2016-01-17 ENCOUNTER — Ambulatory Visit (INDEPENDENT_AMBULATORY_CARE_PROVIDER_SITE_OTHER): Payer: Commercial Managed Care - HMO | Admitting: Family Medicine

## 2016-01-17 VITALS — BP 110/60 | HR 65 | Temp 98.0°F | Wt 113.0 lb

## 2016-01-17 DIAGNOSIS — R197 Diarrhea, unspecified: Secondary | ICD-10-CM

## 2016-01-17 NOTE — Progress Notes (Signed)
Subjective:     Patient ID: Deborah Travis, female   DOB: [birth year], 75 y.o.   MRN: [id number]  HPI Ms. Economos presents today with a 2.5 week history of suprapubic abdominal pain and diarrhea.  She describes mucus in the stool and an orange color and an increased urgency for BMs.  She denies any fever, vomiting, blood in stool, change in diet, dysuria, change in urinary frequency, and recent travel.  She has been on 3 abx this year for bronchitis with the last regimen finishing around 6 weeks ago and has a hsitory of partial pancreatectomy ~20 years ago.  She has not taken any medications for her diarrhea and abdominal pain and never had symptoms like this before, but has been taking a probiotic for the past 6 weeks.    Review of Systems     Objective:   Physical Exam Alert and in no distress.  Cardiac exam shows a regular sinus rhythm without murmurs or gallops.  Lungs are clear to auscultation bilaterally without wheezes, rales, rhonchi. On abdominal exam, non-tender to palpation in all quadrants, normal bowel sounds, and no HSM.      Assessment /Plan:     Diarrhea, unspecified type  Will get 2 stool samples for O/P and c diff toxin given her history of abx use and diarrheal symptoms. She was instructed on getting these samples back to the lab promptly within hours of collection.  She will follow up after results return for further instructions.     History and physical exam conducted by Marylen Ponto (Medical Student) in conjunction with Dr. Redmond School.

## 2016-01-18 ENCOUNTER — Other Ambulatory Visit: Payer: Self-pay | Admitting: Family Medicine

## 2016-01-18 ENCOUNTER — Telehealth: Payer: Self-pay | Admitting: Internal Medicine

## 2016-01-18 ENCOUNTER — Other Ambulatory Visit: Payer: Self-pay

## 2016-01-18 DIAGNOSIS — R197 Diarrhea, unspecified: Secondary | ICD-10-CM | POA: Diagnosis not present

## 2016-01-18 MED ORDER — METRONIDAZOLE 500 MG PO TABS: 500.0000 mg | ORAL_TABLET | Freq: Three times a day (TID) | ORAL | Status: DC

## 2016-01-18 NOTE — Telephone Encounter (Signed)
Had to call [employer] to cancel antibiotic and [facility] per pt request she has been informed word for word

## 2016-01-18 NOTE — Telephone Encounter (Signed)
Let her know that I called the antibiotic and and warn her about alcohol consumption

## 2016-01-18 NOTE — Telephone Encounter (Signed)
Pt called to let you know that she dropped off stool samples at [place]

## 2016-01-19 LAB — C. DIFFICILE GDH AND TOXIN A/B
C. DIFF TOXIN A/B: NOT DETECTED
C. difficile GDH: NOT DETECTED

## 2016-01-19 LAB — OVA AND PARASITE EXAMINATION
OP: NONE SEEN
OP: NONE SEEN

## 2016-01-20 LAB — C. DIFFICILE GDH AND TOXIN A/B
C. DIFF TOXIN A/B: NOT DETECTED
C. difficile GDH: NOT DETECTED

## 2016-01-22 LAB — STOOL CULTURE

## 2016-02-09 ENCOUNTER — Ambulatory Visit: Payer: Commercial Managed Care - HMO | Admitting: Family Medicine

## 2016-02-10 ENCOUNTER — Encounter: Payer: Self-pay | Admitting: Family Medicine

## 2016-02-10 ENCOUNTER — Ambulatory Visit (INDEPENDENT_AMBULATORY_CARE_PROVIDER_SITE_OTHER): Payer: Commercial Managed Care - HMO | Admitting: Family Medicine

## 2016-02-10 VITALS — BP 100/70 | HR 76 | Ht 63.0 in | Wt 111.0 lb

## 2016-02-10 DIAGNOSIS — E1169 Type 2 diabetes mellitus with other specified complication: Secondary | ICD-10-CM

## 2016-02-10 DIAGNOSIS — I152 Hypertension secondary to endocrine disorders: Secondary | ICD-10-CM

## 2016-02-10 DIAGNOSIS — E785 Hyperlipidemia, unspecified: Secondary | ICD-10-CM | POA: Diagnosis not present

## 2016-02-10 DIAGNOSIS — Z8711 Personal history of peptic ulcer disease: Secondary | ICD-10-CM | POA: Diagnosis not present

## 2016-02-10 DIAGNOSIS — I10 Essential (primary) hypertension: Secondary | ICD-10-CM | POA: Diagnosis not present

## 2016-02-10 DIAGNOSIS — E118 Type 2 diabetes mellitus with unspecified complications: Secondary | ICD-10-CM

## 2016-02-10 DIAGNOSIS — E1159 Type 2 diabetes mellitus with other circulatory complications: Secondary | ICD-10-CM | POA: Diagnosis not present

## 2016-02-10 DIAGNOSIS — E039 Hypothyroidism, unspecified: Secondary | ICD-10-CM | POA: Diagnosis not present

## 2016-02-10 DIAGNOSIS — K219 Gastro-esophageal reflux disease without esophagitis: Secondary | ICD-10-CM

## 2016-02-10 LAB — POCT GLYCOSYLATED HEMOGLOBIN (HGB A1C): HEMOGLOBIN A1C: 6.6

## 2016-02-10 NOTE — Progress Notes (Signed)
  Subjective:    Patient ID: Deborah Travis, female    DOB: 11/16/1940, 75 y.o.   MRN: [id number]  Deborah Travis is a 75 y.o. female who presents for follow-up of Type 2 diabetes mellitus.  Patient is checking home blood sugars.   Home blood sugar records: 125  How often is blood sugars being checked: BIDS Current symptoms/problems include none Daily foot checks: yes  Any foot concerns: none Last eye exam: Dr.Hecker appointment next month Exercise: walking 1 hr a day She is having no reflux symptoms. She does have a previous history of ulcer disease. She continues on her thyroid medication without difficulty. The following portions of the patient's history were reviewed and updated as appropriate: allergies, current medications, past medical history, past social history and problem list.  ROS as in subjective above.     Objective:    Physical Exam Alert and in no distress otherwise not examined.   Lab Review Diabetic Labs Latest Ref Rng 10/03/2015 06/01/2015 02/01/2015 09/16/2014 05/12/2014  HbA1c - 6.5 6.4 6.4 6.4 6.4  Chol 125 - 200 mg/dL - 102(L) - - 104  HDL >=46 mg/dL - 50 - - 53  Calc LDL <130 mg/dL - 28 - - 29  Triglycerides <150 mg/dL - 119 - - 112  Creatinine 0.60 - 0.93 mg/dL - 1.03(H) - - 0.93   BP/Weight 01/17/2016 11/11/2015 11/08/2015 [id number]  Systolic BP A999333 94 123456 99991111 123456  Diastolic BP 60 70 72 78 76  Wt. (Lbs) 113 108 113 114 115  BMI 20.02 19.14 20.02 20.2 20.38   Foot/eye exam completion dates Latest Ref Rng 06/01/2015 12/11/2013  Eye Exam No Retinopathy - No Retinopathy  Foot Form Completion - Done -  A1c is 6.6  Deborah Travis  reports that she quit smoking about 3 months ago. She has never used smokeless tobacco. She reports that she does not drink alcohol or use illicit drugs.     Assessment & Plan:    Type 2 diabetes mellitus with complication, without long-term current use of insulin (HCC)  Hypertension associated with diabetes ([place])  Gastroesophageal  reflux disease without esophagitis  History of peptic ulcer disease  Hypothyroidism, unspecified hypothyroidism type  Hyperlipidemia associated with type 2 diabetes mellitus ([place])   1. Rx changes: none 2. Education: Reviewed 'ABCs' of diabetes management (respective goals in parentheses):  A1C (<7), blood pressure (<130/80), and cholesterol (LDL <100). 3. Compliance at present is estimated to be good. Efforts to improve compliance (if necessary) will be directed at Continuing present diet and exercise. 4. Follow up: 4 months

## 2016-02-21 ENCOUNTER — Telehealth: Payer: Self-pay | Admitting: Family Medicine

## 2016-02-21 NOTE — Telephone Encounter (Signed)
I have gotten Auth [id number] 6 visit 02/21/16 to 08/19/16

## 2016-02-21 NOTE — Telephone Encounter (Signed)
Requesting referral to [facility], Dr Kathlen Mody for her appt on 7/6 @ 1:45

## 2016-03-01 ENCOUNTER — Encounter: Payer: Self-pay | Admitting: Family Medicine

## 2016-03-01 DIAGNOSIS — H26492 Other secondary cataract, left eye: Secondary | ICD-10-CM | POA: Diagnosis not present

## 2016-03-01 DIAGNOSIS — H40013 Open angle with borderline findings, low risk, bilateral: Secondary | ICD-10-CM | POA: Diagnosis not present

## 2016-03-01 DIAGNOSIS — H26491 Other secondary cataract, right eye: Secondary | ICD-10-CM | POA: Diagnosis not present

## 2016-03-01 DIAGNOSIS — E119 Type 2 diabetes mellitus without complications: Secondary | ICD-10-CM | POA: Diagnosis not present

## 2016-03-01 LAB — HM DIABETES EYE EXAM

## 2016-05-28 ENCOUNTER — Other Ambulatory Visit: Payer: Self-pay | Admitting: Family Medicine

## 2016-05-28 DIAGNOSIS — I152 Hypertension secondary to endocrine disorders: Secondary | ICD-10-CM

## 2016-05-28 DIAGNOSIS — E119 Type 2 diabetes mellitus without complications: Secondary | ICD-10-CM

## 2016-05-28 DIAGNOSIS — I10 Essential (primary) hypertension: Principal | ICD-10-CM

## 2016-05-28 DIAGNOSIS — E1159 Type 2 diabetes mellitus with other circulatory complications: Secondary | ICD-10-CM

## 2016-06-15 ENCOUNTER — Ambulatory Visit (INDEPENDENT_AMBULATORY_CARE_PROVIDER_SITE_OTHER): Payer: Commercial Managed Care - HMO | Admitting: Family Medicine

## 2016-06-15 ENCOUNTER — Encounter: Payer: Self-pay | Admitting: Family Medicine

## 2016-06-15 VITALS — BP 120/70 | HR 76 | Ht 63.0 in | Wt 116.0 lb

## 2016-06-15 DIAGNOSIS — E039 Hypothyroidism, unspecified: Secondary | ICD-10-CM | POA: Diagnosis not present

## 2016-06-15 DIAGNOSIS — K219 Gastro-esophageal reflux disease without esophagitis: Secondary | ICD-10-CM | POA: Diagnosis not present

## 2016-06-15 DIAGNOSIS — I10 Essential (primary) hypertension: Secondary | ICD-10-CM

## 2016-06-15 DIAGNOSIS — Z8711 Personal history of peptic ulcer disease: Secondary | ICD-10-CM | POA: Diagnosis not present

## 2016-06-15 DIAGNOSIS — E1159 Type 2 diabetes mellitus with other circulatory complications: Secondary | ICD-10-CM | POA: Diagnosis not present

## 2016-06-15 DIAGNOSIS — E785 Hyperlipidemia, unspecified: Secondary | ICD-10-CM | POA: Diagnosis not present

## 2016-06-15 DIAGNOSIS — D692 Other nonthrombocytopenic purpura: Secondary | ICD-10-CM

## 2016-06-15 DIAGNOSIS — Z23 Encounter for immunization: Secondary | ICD-10-CM | POA: Diagnosis not present

## 2016-06-15 DIAGNOSIS — I152 Hypertension secondary to endocrine disorders: Secondary | ICD-10-CM

## 2016-06-15 DIAGNOSIS — E1169 Type 2 diabetes mellitus with other specified complication: Secondary | ICD-10-CM

## 2016-06-15 DIAGNOSIS — E118 Type 2 diabetes mellitus with unspecified complications: Secondary | ICD-10-CM

## 2016-06-15 LAB — POCT UA - MICROALBUMIN: CREATININE, POC: 26.3 mg/dL

## 2016-06-15 LAB — CBC WITH DIFFERENTIAL/PLATELET
Basophils Absolute: 0 {cells}/uL (ref 0–200)
Basophils Relative: 0 %
Eosinophils Absolute: 192 {cells}/uL (ref 15–500)
Eosinophils Relative: 2 %
HCT: 38.4 % (ref 35.0–45.0)
Hemoglobin: 12.4 g/dL (ref 11.7–15.5)
Lymphocytes Relative: 28 %
Lymphs Abs: 2688 {cells}/uL (ref 850–3900)
MCH: 31.3 pg (ref 27.0–33.0)
MCHC: 32.3 g/dL (ref 32.0–36.0)
MCV: 97 fL (ref 80.0–100.0)
MPV: 8.8 fL (ref 7.5–12.5)
Monocytes Absolute: 768 {cells}/uL (ref 200–950)
Monocytes Relative: 8 %
Neutro Abs: 5952 {cells}/uL (ref 1500–7800)
Neutrophils Relative %: 62 %
Platelets: 317 K/uL (ref 140–400)
RBC: 3.96 MIL/uL (ref 3.80–5.10)
RDW: 13.8 % (ref 11.0–15.0)
WBC: 9.6 K/uL (ref 4.0–10.5)

## 2016-06-15 LAB — LIPID PANEL
Cholesterol: 133 mg/dL (ref 125–200)
HDL: 73 mg/dL
LDL Cholesterol: 45 mg/dL
Total CHOL/HDL Ratio: 1.8 ratio
Triglycerides: 75 mg/dL
VLDL: 15 mg/dL

## 2016-06-15 LAB — COMPREHENSIVE METABOLIC PANEL WITH GFR
ALT: 13 U/L (ref 6–29)
AST: 18 U/L (ref 10–35)
Albumin: 4.1 g/dL (ref 3.6–5.1)
Alkaline Phosphatase: 91 U/L (ref 33–130)
BUN: 14 mg/dL (ref 7–25)
CO2: 18 mmol/L — ABNORMAL LOW (ref 20–31)
Calcium: 8.9 mg/dL (ref 8.6–10.4)
Chloride: 107 mmol/L (ref 98–110)
Creat: 1.12 mg/dL — ABNORMAL HIGH (ref 0.60–0.93)
Glucose, Bld: 105 mg/dL — ABNORMAL HIGH (ref 65–99)
Potassium: 5.2 mmol/L (ref 3.5–5.3)
Sodium: 137 mmol/L (ref 135–146)
Total Bilirubin: 0.3 mg/dL (ref 0.2–1.2)
Total Protein: 7.1 g/dL (ref 6.1–8.1)

## 2016-06-15 LAB — TSH: TSH: 0.6 m[IU]/L

## 2016-06-15 LAB — POCT GLYCOSYLATED HEMOGLOBIN (HGB A1C): HEMOGLOBIN A1C: 6.3

## 2016-06-15 NOTE — Progress Notes (Signed)
Subjective:    Patient ID: Deborah Travis, female    DOB: 1940-11-02, 75 y.o.   MRN: [id number]  Deborah Travis is a 75 y.o. female who presents for follow-up of Type 2 diabetes mellitus.  Patient is checking home blood sugars.   Home blood sugar records: 96 to 120 How often is blood sugars being checked: one time a day Current symptoms/problems none Daily foot checks: yes  Any foot concerns: none Last eye exam: 2017 Exercise: walking  She continues on her Synthroid and is having no difficulty with that. She takes pravachol for her cholesterol and is having no aches or pains. Continues on metformin without difficulty. She has a history of ulcer disease but is having no abdominal complaints. She takes lisinopril for her blood pressure and again having no problems with that. The following portions of the patient's history were reviewed and updated as appropriate: allergies, current medications, past medical history, past social history and problem list. Also of note is the fact that she is a new great-grandmother ROS as in subjective above.     Objective:    Physical Exam Alert and in no distress otherwise not examined. Foot exam is recorded and is normal.  Lab Review Diabetic Labs Latest Ref Rng & Units 02/10/2016 10/03/2015 06/01/2015 02/01/2015 09/16/2014  HbA1c - 6.6 6.5 6.4 6.4 6.4  Microalbumin mg/L - - - - 5.3  Micro/Creat Ratio - - - - - 10.9  Chol 125 - 200 mg/dL - - 102(L) - -  HDL >=46 mg/dL - - 50 - -  Calc LDL <130 mg/dL - - 28 - -  Triglycerides <150 mg/dL - - 119 - -  Creatinine 0.60 - 0.93 mg/dL - - 1.03(H) - -   BP/Weight 02/10/2016 01/17/2016 11/11/2015 [id number]  Systolic BP 123XX123 A999333 94 123456 99991111  Diastolic BP 70 60 70 72 78  Wt. (Lbs) 111 113 108 113 114  BMI 19.67 20.02 19.14 20.02 20.2   Foot/eye exam completion dates Latest Ref Rng & Units 03/01/2016 06/01/2015  Eye Exam No Retinopathy No Retinopathy -  Foot Form Completion - - Done  A1c is 6.3 purpuric  lesions noted on her forearms.  [name]  reports that she quit smoking about 7 months ago. She smoked 0.10 packs per day. She has never used smokeless tobacco. She reports that she does not drink alcohol or use drugs.     Assessment & Plan:    Type 2 diabetes mellitus with complication, without long-term current use of insulin (HCC) - Plan: POCT UA - Microalbumin, HgB A1c, CBC with Differential/Platelet, Comprehensive metabolic panel, Lipid panel  Hypertension associated with diabetes ([place]) - Plan: CBC with Differential/Platelet, Comprehensive metabolic panel  Gastroesophageal reflux disease without esophagitis  Hyperlipidemia associated with type 2 diabetes mellitus ([place]) - Plan: Lipid panel  Need for prophylactic vaccination and inoculation against influenza - Plan: Flu vaccine HIGH DOSE PF (Fluzone High dose)  Need for prophylactic vaccination against Streptococcus pneumoniae (pneumococcus) - Plan: Pneumococcal conjugate vaccine 13-valent  History of peptic ulcer disease  Hypothyroidism, unspecified type - Plan: TSH  Senile purpura ([place])   1. Rx changes: none 2. Education: Reviewed 'ABCs' of diabetes management (respective goals in parentheses):  A1C (<7), blood pressure (<130/80), and cholesterol (LDL <100). 3. Compliance at present is estimated to be excellent. Efforts to improve compliance (if necessary) will be directed at No change. 4. Follow up: 4 months Her immunizations were updated. I discussed health maintenance specifically mammogram and she  is not interested. Her weight is up slightly and I complimented her on that.

## 2016-09-03 DIAGNOSIS — H40013 Open angle with borderline findings, low risk, bilateral: Secondary | ICD-10-CM | POA: Diagnosis not present

## 2016-09-03 DIAGNOSIS — H01003 Unspecified blepharitis right eye, unspecified eyelid: Secondary | ICD-10-CM | POA: Diagnosis not present

## 2016-09-03 DIAGNOSIS — H02823 Cysts of right eye, unspecified eyelid: Secondary | ICD-10-CM | POA: Diagnosis not present

## 2016-10-17 ENCOUNTER — Ambulatory Visit (INDEPENDENT_AMBULATORY_CARE_PROVIDER_SITE_OTHER): Payer: Medicare HMO | Admitting: Family Medicine

## 2016-10-17 ENCOUNTER — Encounter: Payer: Self-pay | Admitting: Family Medicine

## 2016-10-17 VITALS — BP 120/70 | HR 95 | Ht 63.0 in | Wt 111.0 lb

## 2016-10-17 DIAGNOSIS — E118 Type 2 diabetes mellitus with unspecified complications: Secondary | ICD-10-CM | POA: Diagnosis not present

## 2016-10-17 LAB — POCT GLYCOSYLATED HEMOGLOBIN (HGB A1C): Hemoglobin A1C: 6.4

## 2016-10-17 NOTE — Progress Notes (Signed)
  Subjective:    Patient ID: Deborah Travis, female    DOB: 1941-04-15, 76 y.o.   MRN: [id number]  Deborah Travis is a 76 y.o. female who presents for follow-up of Type 2 diabetes mellitus.  Patient is checking home blood sugars.   Home blood sugar records: 100 to 150 How often is blood sugars being checked: BID Current symptoms/problems None Daily foot checks: Yes   Any foot concerns: None Last eye exam: 03/01/16 Exercise: walking She continues on her pravastatin and having no difficulty with that. She also taking metformin, lisinopril and her thyroid medication. No concerns or complaints about any of them. The following portions of the patient's history were reviewed and updated as appropriate: allergies, current medications, past medical history, past social history and problem list.  ROS as in subjective above.     Objective:    Physical Exam Alert and in no distress otherwise not examined.   Lab Review Diabetic Labs Latest Ref Rng & Units 06/15/2016 02/10/2016 10/03/2015 06/01/2015 02/01/2015  HbA1c - 6.3 6.6 6.5 6.4 6.4  Microalbumin mg/L <5.0 - - - -  Micro/Creat Ratio - <19.0 - - - -  Chol 125 - 200 mg/dL 133 - - 102(L) -  HDL >=46 mg/dL 73 - - 50 -  Calc LDL <130 mg/dL 45 - - 28 -  Triglycerides <150 mg/dL 75 - - 119 -  Creatinine 0.60 - 0.93 mg/dL 1.12(H) - - 1.03(H) -   BP/Weight 06/15/2016 02/10/2016 01/17/2016 11/11/2015 [id number]  Systolic BP 123456 [id number]  Diastolic BP 70 70 60 70 72  Wt. (Lbs) 116 111 113 108 113  BMI 20.55 19.67 20.02 19.14 20.02   Foot/eye exam completion dates Latest Ref Rng & Units 06/15/2016 03/01/2016  Eye Exam No Retinopathy - No Retinopathy  Foot Form Completion - Done -  A1c is 6.4  Deborah Travis  reports that she quit smoking about a year ago. She smoked 0.10 packs per day. She has never used smokeless tobacco. She reports that she does not drink alcohol or use drugs.     Assessment & Plan:    Type 2 diabetes mellitus with complication,  without long-term current use of insulin ([place]) - Plan: HgB A1c    1. Rx changes: none 2. Education: Reviewed 'ABCs' of diabetes management (respective goals in parentheses):  A1C (<7), blood pressure (<130/80), and cholesterol (LDL <100). 3. Compliance at present is estimated to be good. Efforts to improve compliance (if necessary) will be directed at No change. 4. Follow up: 4 months Discussed cutting back on her blood sugar readings. Right now she is taking them twice per day. She seems be very stable so checking it less often is certainly reasonable.

## 2016-10-18 ENCOUNTER — Ambulatory Visit: Payer: Commercial Managed Care - HMO | Admitting: Family Medicine

## 2017-01-30 ENCOUNTER — Telehealth: Payer: Self-pay | Admitting: Family Medicine

## 2017-01-30 NOTE — Telephone Encounter (Signed)
Pt needs refill Metformin to [facility] mail order for 90 days

## 2017-01-31 ENCOUNTER — Other Ambulatory Visit: Payer: Self-pay

## 2017-01-31 DIAGNOSIS — E119 Type 2 diabetes mellitus without complications: Secondary | ICD-10-CM

## 2017-01-31 MED ORDER — METFORMIN HCL 1000 MG PO TABS: ORAL_TABLET | ORAL | 1 refills | Status: DC

## 2017-02-03 NOTE — Telephone Encounter (Signed)
[facility]ri filled

## 2017-03-04 ENCOUNTER — Telehealth: Payer: Self-pay | Admitting: Family Medicine

## 2017-03-04 ENCOUNTER — Other Ambulatory Visit: Payer: Self-pay

## 2017-03-04 MED ORDER — ALPRAZOLAM 0.25 MG PO TABS: 0.2500 mg | ORAL_TABLET | Freq: Two times a day (BID) | ORAL | 0 refills | Status: DC | PRN

## 2017-03-04 NOTE — Telephone Encounter (Signed)
Give her Xanax 0.25 mg, #6 one when necessary anxiety. Call her and let her know

## 2017-03-04 NOTE — Telephone Encounter (Signed)
Called in xanax per JCL pt informed and she verbalized understanding

## 2017-03-04 NOTE — Telephone Encounter (Signed)
Pt called and states that her sister died November 30, 2022 night and that her funeral visitation is tonight and the funeral is tomorrow she is wanting to know if you will send her something in to help her relax to get her through it, she states she does not want to be crying though the night [id number] [place] [place] and pt can be reached at [phone] states if someone can please call her when something is called in or not,

## 2017-03-19 DIAGNOSIS — H40013 Open angle with borderline findings, low risk, bilateral: Secondary | ICD-10-CM | POA: Diagnosis not present

## 2017-03-19 LAB — HM DIABETES EYE EXAM

## 2017-03-21 ENCOUNTER — Encounter: Payer: Self-pay | Admitting: Family Medicine

## 2017-04-15 ENCOUNTER — Ambulatory Visit (INDEPENDENT_AMBULATORY_CARE_PROVIDER_SITE_OTHER): Payer: Medicare HMO | Admitting: Family Medicine

## 2017-04-15 ENCOUNTER — Other Ambulatory Visit: Payer: Self-pay

## 2017-04-15 ENCOUNTER — Encounter: Payer: Self-pay | Admitting: Family Medicine

## 2017-04-15 VITALS — BP 110/68 | HR 71 | Ht 63.0 in | Wt 113.8 lb

## 2017-04-15 DIAGNOSIS — Z8711 Personal history of peptic ulcer disease: Secondary | ICD-10-CM

## 2017-04-15 DIAGNOSIS — E039 Hypothyroidism, unspecified: Secondary | ICD-10-CM

## 2017-04-15 DIAGNOSIS — E1169 Type 2 diabetes mellitus with other specified complication: Secondary | ICD-10-CM | POA: Diagnosis not present

## 2017-04-15 DIAGNOSIS — E1159 Type 2 diabetes mellitus with other circulatory complications: Secondary | ICD-10-CM | POA: Diagnosis not present

## 2017-04-15 DIAGNOSIS — K219 Gastro-esophageal reflux disease without esophagitis: Secondary | ICD-10-CM

## 2017-04-15 DIAGNOSIS — E118 Type 2 diabetes mellitus with unspecified complications: Secondary | ICD-10-CM

## 2017-04-15 DIAGNOSIS — D692 Other nonthrombocytopenic purpura: Secondary | ICD-10-CM | POA: Diagnosis not present

## 2017-04-15 DIAGNOSIS — M8589 Other specified disorders of bone density and structure, multiple sites: Secondary | ICD-10-CM

## 2017-04-15 DIAGNOSIS — E785 Hyperlipidemia, unspecified: Secondary | ICD-10-CM

## 2017-04-15 DIAGNOSIS — E119 Type 2 diabetes mellitus without complications: Secondary | ICD-10-CM

## 2017-04-15 DIAGNOSIS — Z23 Encounter for immunization: Secondary | ICD-10-CM | POA: Diagnosis not present

## 2017-04-15 DIAGNOSIS — I152 Hypertension secondary to endocrine disorders: Secondary | ICD-10-CM

## 2017-04-15 DIAGNOSIS — I10 Essential (primary) hypertension: Secondary | ICD-10-CM

## 2017-04-15 LAB — POCT GLYCOSYLATED HEMOGLOBIN (HGB A1C): Hemoglobin A1C: 6.6

## 2017-04-15 MED ORDER — LEVOTHYROXINE SODIUM 100 MCG PO TABS: 100.0000 ug | ORAL_TABLET | Freq: Every day | ORAL | 3 refills | Status: DC

## 2017-04-15 MED ORDER — METFORMIN HCL 1000 MG PO TABS: ORAL_TABLET | ORAL | 1 refills | Status: DC

## 2017-04-15 MED ORDER — PRAVASTATIN SODIUM 20 MG PO TABS: 20.0000 mg | ORAL_TABLET | Freq: Every day | ORAL | 3 refills | Status: DC

## 2017-04-15 NOTE — Patient Instructions (Signed)
  Deborah Travis , Thank you for taking time to come for your Medicare Wellness Visit. I appreciate your ongoing commitment to your health goals. Please review the following plan we discussed and let me know if I can assist you in the future.   These are the goals we discussed: Goals    None      This is a list of the screening recommended for you and due dates:  Health Maintenance  Topic Date Due  . Mammogram  05/15/2015  . Flu Shot  03/27/2017  . Hemoglobin A1C  04/16/2017  . Complete foot exam   06/15/2017  . Eye exam for diabetics  03/19/2018  . Tetanus Vaccine  06/27/2021  . Colon Cancer Screening  12/09/2023  . DEXA scan (bone density measurement)  Completed  . Pneumonia vaccines  Completed

## 2017-04-15 NOTE — Progress Notes (Signed)
Deborah Travis is a 76 y.o. female who presents for annual wellness visit and follow-up on chronic medical conditions.  She has the following concerns: She has no particular concerns or complaints other than occasional difficulty with loose stools. She checks her sugars regularly but does need a new glucometer. She continues on her Prinivil, metformin, levothyroxine and pravastatin and is having no difficulty with any these medications. He remains physically active and also takes care of her great grandchildren. Her home life is very stable. She has noted no cognitive difficulty. She needs a mammogram but is not interested in getting one. She has a history of reflux disease and ulcer but no problems at the present time.  Immunizations and Health Maintenance Immunization History  Administered Date(s) Administered  . Influenza Split 05/21/2011, 06/12/2012, 07/15/2013  . Influenza, High Dose Seasonal PF 07/13/2013, 07/01/2014, 06/01/2015, 06/15/2016  . Pneumococcal Conjugate-13 06/15/2016  . Pneumococcal Polysaccharide-23 06/12/2012  . Tdap 06/28/2011  . Zoster 06/27/2013   Health Maintenance Due  Topic Date Due  . MAMMOGRAM  05/15/2015  . INFLUENZA VACCINE  03/27/2017    Last Pap smear:N/A Last mammogram:2014 Last colonoscopy:2015. History of colonic polyps. Last LTJQ:3009. History of osteopenia. Dentist:Dr.Morgan Ophtho:Dr.Weaver Exercise: yes Former smoker. Other doctors caring for patient include:  Dr Amedeo Plenty  Advanced directives:?    Depression screen:  See questionnaire below.  Depression screen [facility] 2/9 02/10/2016 09/16/2014 05/12/2014 05/07/2013 02/05/2012  Decreased Interest 0 0 0 0 1  Down, Depressed, Hopeless 0 0 0 - 1  PHQ - 2 Score 0 0 0 0 2    Fall Risk Screen: see questionnaire below. Fall Risk  02/10/2016 09/16/2014 05/12/2014 10/14/2012  Falls in the past year? No No No No    ADL screen:  See questionnaire below Functional Status Survey:Normal     Review of  Systems Constitutional: -, -unexpected weight change, -anorexia, -fatigue Allergy: -sneezing, -itching, -congestion Neurology: -, -numbness, , -memory loss, -falls, -dizziness    PHYSICAL EXAM:   General Appearance: Alert, cooperative, no distress, appears stated age Head: Normocephalic, without obvious abnormality, atraumatic Eyes: PERRL, conjunctiva/corneas clear, EOM's intact, fundi benign Ears: Normal TM's and external ear canals Nose: Nares normal, mucosa normal, no drainage or sinus tenderness Throat: Lips, mucosa, and tongue normal; teeth and gums normal Neck: Supple, no lymphadenopathy;  thyroid:  no enlargement/tenderness/nodules; no carotid bruit or JVD Lungs: Clear to auscultation bilaterally without wheezes, rales or ronchi; respirations unlabored Heart: Regular rate and rhythm, S1 and S2 normal, no murmur, rubor gallop Abdomen: Soft, non-tender, nondistended, normoactive bowel sounds,  no masses, no hepatosplenomegaly Extremities: No clubbing, cyanosis or edema Pulses: 2+ and symmetric all extremities Skin:  Purpuric lesions noted on arms Lymph nodes: Cervical, supraclavicular, and axillary nodes normal Neurologic:  CNII-XII intact, normal strength, sensation and gait; reflexes 2+ and symmetric throughout Psych: Normal mood, affect, hygiene and grooming. A1c is 6.6 ASSESSMENT/PLAN: Type 2 diabetes mellitus with complication, without long-term current use of insulin (HCC) - Plan: POCT glycosylated hemoglobin (Hb A1C)  Hyperlipidemia associated with type 2 diabetes mellitus ([place])  History of peptic ulcer disease  Hypothyroidism, unspecified type  Senile purpura ([place])  Osteopenia of multiple sites - Plan: DG Bone Density  Hypertension associated with diabetes ([place])  Gastroesophageal reflux disease without esophagitis  Need for influenza vaccination - Plan: Flu vaccine HIGH DOSE PF (Fluzone High dose) In general she has enjoyed excellent health and is taking  great care of herself.     at least 30 minutes of aerobic  activity at least 5 days/week and weight-bearing exercise 2x/week;reviewed; healthy diet, including goals of calcium and vitamin D intake and alcohol recommendations (less than or equal to 1 drink/day) reviewed; regular seatbelt use; Marland Kitchen  Immunization recommendations discussed.  Colonoscopy recommendations reviewed Her medications were renewed  Medicare Attestation I have personally reviewed: The patient's medical and social history Their use of alcohol, tobacco or illicit drugs Their current medications and supplements The patient's functional ability including ADLs,fall risks, home safety risks, cognitive, and hearing and visual impairment Diet and physical activities Evidence for depression or mood disorders  The patient's weight, height, and BMI have been recorded in the chart.  I have made referrals, counseling, and provided education to the patient based on review of the above and I have provided the patient with a written personalized care plan for preventive services.   Also recommended getting the Shingrix vaccine.  Wyatt Haste, MD   04/15/2017

## 2017-04-22 ENCOUNTER — Telehealth: Payer: Self-pay | Admitting: Family Medicine

## 2017-04-22 ENCOUNTER — Other Ambulatory Visit: Payer: Self-pay

## 2017-04-22 DIAGNOSIS — I152 Hypertension secondary to endocrine disorders: Secondary | ICD-10-CM

## 2017-04-22 DIAGNOSIS — I10 Essential (primary) hypertension: Principal | ICD-10-CM

## 2017-04-22 DIAGNOSIS — E1159 Type 2 diabetes mellitus with other circulatory complications: Secondary | ICD-10-CM

## 2017-04-22 MED ORDER — LISINOPRIL 10 MG PO TABS: 10.0000 mg | ORAL_TABLET | Freq: Every day | ORAL | 1 refills | Status: DC

## 2017-04-22 NOTE — Telephone Encounter (Signed)
Can u please make that a 90 day supply

## 2017-04-22 NOTE — Telephone Encounter (Signed)
This has been done.

## 2017-04-22 NOTE — Telephone Encounter (Signed)
Pt called requesting a refill on her  lisinopril pt would like it sent to the [place], [place] pt can be reached at [phone]

## 2017-05-02 DIAGNOSIS — H26491 Other secondary cataract, right eye: Secondary | ICD-10-CM | POA: Diagnosis not present

## 2017-05-16 DIAGNOSIS — H26492 Other secondary cataract, left eye: Secondary | ICD-10-CM | POA: Diagnosis not present

## 2017-08-12 ENCOUNTER — Ambulatory Visit: Payer: Medicare HMO | Admitting: Family Medicine

## 2017-09-03 ENCOUNTER — Other Ambulatory Visit: Payer: Self-pay | Admitting: Family Medicine

## 2017-09-03 DIAGNOSIS — I152 Hypertension secondary to endocrine disorders: Secondary | ICD-10-CM

## 2017-09-03 DIAGNOSIS — E1159 Type 2 diabetes mellitus with other circulatory complications: Secondary | ICD-10-CM

## 2017-09-03 DIAGNOSIS — E119 Type 2 diabetes mellitus without complications: Secondary | ICD-10-CM

## 2017-09-03 DIAGNOSIS — I10 Essential (primary) hypertension: Secondary | ICD-10-CM

## 2017-10-14 ENCOUNTER — Ambulatory Visit: Payer: Medicare HMO | Admitting: Family Medicine

## 2017-10-22 ENCOUNTER — Ambulatory Visit: Payer: Medicare HMO | Admitting: Family Medicine

## 2017-11-18 ENCOUNTER — Ambulatory Visit: Payer: Medicare HMO | Admitting: Medical

## 2017-11-18 ENCOUNTER — Encounter: Payer: Self-pay | Admitting: Medical

## 2017-11-18 VITALS — BP 108/62 | HR 77 | Temp 98.0°F | Ht 63.0 in | Wt 118.2 lb

## 2017-11-18 DIAGNOSIS — R05 Cough: Secondary | ICD-10-CM

## 2017-11-18 DIAGNOSIS — J988 Other specified respiratory disorders: Secondary | ICD-10-CM | POA: Diagnosis not present

## 2017-11-18 DIAGNOSIS — R42 Dizziness and giddiness: Secondary | ICD-10-CM

## 2017-11-18 DIAGNOSIS — R059 Cough, unspecified: Secondary | ICD-10-CM | POA: Insufficient documentation

## 2017-11-18 MED ORDER — AMOXICILLIN 875 MG PO TABS: 875.0000 mg | ORAL_TABLET | Freq: Two times a day (BID) | ORAL | 0 refills | Status: DC

## 2017-11-18 NOTE — Progress Notes (Signed)
Subjective:  Deborah Travis is a 77 y.o. female who presents for  Chief Complaint  Patient presents with  . Acute Visit    throat very sore, chest congestion, coughing up mucous, coughing   Symptoms include cough with green sputum, throat is draining, feels rotten, sore throat, x 1+ week.  Denies body aches, chills. Not sleeping well due to cough. For 2 weeks having some dizziness when getting up from bed from supine position.  Using Zycam for symptoms. denies sick contacts.  Past history is significant for diabetes, thyroid disease, HTN.    Patient is not a smoker. No other aggravating or relieving factors.  No other complaint.    Past Medical History:  Diagnosis Date  . Chronic kidney disease    RENAL STONE  . Chronic pancreatitis ([place])   . Diabetes mellitus   . Duodenal ulcer   . Dyslipidemia   . GERD (gastroesophageal reflux disease)   . Hypertension   . Osteopenia   . Thyroid disease    HYPOTHYROID  . Vitamin D deficiency     Current Outpatient Medications on File Prior to Visit  Medication Sig Dispense Refill  . Alcohol Swabs (B-D SINGLE USE SWABS BUTTERFLY) PADS 1 each by Does not apply route 2 (two) times daily. 300 each 3  . b complex vitamins capsule Take 1 capsule by mouth daily.    . Blood Glucose Calibration (ACCU-CHEK AVIVA) SOLN Use as directed 3 each 0  . Blood Glucose Monitoring Suppl (ACCU-CHEK AVIVA PLUS) W/DEVICE KIT Use as directed Patient is to test BID DX E11.9 1 kit 0  . Calcium Citrate-Vitamin D 1000-400 LIQD     . glucose blood test strip THIS IS FOR Accucheck Aviva Plus Use as instructed PATIENT IS TO TEST ONE TIME A DAY DX: E11.9 100 each 3  . levothyroxine (SYNTHROID, LEVOTHROID) 100 MCG tablet Take 1 tablet (100 mcg total) by mouth daily. 90 tablet 3  . lisinopril (PRINIVIL,ZESTRIL) 10 MG tablet TAKE 1 TABLET EVERY DAY 90 tablet 0  . metFORMIN (GLUCOPHAGE) 1000 MG tablet TAKE 1 TABLET TWICE DAILY WITH A MEAL 180 tablet 0  . Multiple Vitamin  (MULITIVITAMIN WITH MINERALS) TABS Take 1 tablet by mouth daily.    . pravastatin (PRAVACHOL) 20 MG tablet Take 1 tablet (20 mg total) by mouth daily. 90 tablet 3   No current facility-administered medications on file prior to visit.     ROS as in subjective   Objective: BP 108/62 (BP Location: Right Arm, Patient Position: Sitting, Cuff Size: Normal)   Pulse 77   Temp 98 F (36.7 C) (Oral)   Ht 5' 3"  (1.6 m)   Wt 118 lb 3.2 oz (53.6 kg)   SpO2 97%   BMI 20.94 kg/m   General appearance: Alert, well developed, well nourished, no distress                             Skin: warm, no rash                           Head: mild maxillary sinus tenderness,                            Eyes: conjunctiva normal, corneas clear  Ears: flat left tympanic membrane, flat right tympanic membrane, external ear canals normal                          Nose: septum midline, turbinates swollen, with erythema and clear discharge             Mouth/throat: MMM, tongue normal, mild pharyngeal erythema                           Neck: supple, no adenopathy, no thyromegaly, non tender                         Lungs: decreased sounds in general, but no wheezes, no rales, no rhonchi        Assessment  Encounter Diagnoses  Name Primary?  Marland Kitchen Respiratory tract infection Yes  . Cough   . Dizziness       Plan: Discussed diagnosis.   Discussed usual time frame to see improvement. Discussed possible complications or symptoms that would prompt call back or recheck within the next few days.     Medications prescribed:  Begin Mucinex DM OTC for cough suppression and congestion.   Caution advised as this may cause drowsiness.  Complete the course of Amoxicillin antibiotic prescribed today.    Specific home care recommendations discussed:  Pain/fever relief: You may use over-the-counter Tylenol for pain or fever  Drink extra fluids. Fluids help thin the mucus so your sinuses can drain  more easily.   Applying either moist heat or ice packs to the sinus areas may help relieve discomfort.  Use saline nasal sprays to help moisten your sinuses. The sprays can be found at your local drugstore.   Patient was advised to call or return if worse or not improving in the next few days.    Patient voiced understanding of diagnosis, recommendations, and treatment plan.

## 2017-11-19 ENCOUNTER — Telehealth: Payer: Self-pay | Admitting: Medical

## 2017-11-19 ENCOUNTER — Other Ambulatory Visit: Payer: Self-pay | Admitting: Medical

## 2017-11-19 MED ORDER — MECLIZINE HCL 25 MG PO TABS: 25.0000 mg | ORAL_TABLET | Freq: Two times a day (BID) | ORAL | 0 refills | Status: DC

## 2017-11-19 NOTE — Telephone Encounter (Signed)
I sent yesterday evening (Tuesday), hydrate well, c/t antibiotic

## 2017-11-19 NOTE — Telephone Encounter (Signed)
Pt states she is really dizzy and would like [facility]cl[facility]ine [facility] [address]

## 2017-11-20 NOTE — Telephone Encounter (Signed)
Left message for pt

## 2017-12-03 DIAGNOSIS — H40013 Open angle with borderline findings, low risk, bilateral: Secondary | ICD-10-CM | POA: Diagnosis not present

## 2017-12-24 ENCOUNTER — Encounter: Payer: Self-pay | Admitting: Family Medicine

## 2017-12-24 ENCOUNTER — Ambulatory Visit (INDEPENDENT_AMBULATORY_CARE_PROVIDER_SITE_OTHER): Payer: Medicare HMO | Admitting: Family Medicine

## 2017-12-24 VITALS — BP 98/62 | HR 75 | Temp 97.5°F | Ht 63.0 in | Wt 113.4 lb

## 2017-12-24 DIAGNOSIS — E1169 Type 2 diabetes mellitus with other specified complication: Secondary | ICD-10-CM | POA: Diagnosis not present

## 2017-12-24 DIAGNOSIS — M8589 Other specified disorders of bone density and structure, multiple sites: Secondary | ICD-10-CM | POA: Diagnosis not present

## 2017-12-24 DIAGNOSIS — I10 Essential (primary) hypertension: Secondary | ICD-10-CM | POA: Diagnosis not present

## 2017-12-24 DIAGNOSIS — D692 Other nonthrombocytopenic purpura: Secondary | ICD-10-CM | POA: Diagnosis not present

## 2017-12-24 DIAGNOSIS — E039 Hypothyroidism, unspecified: Secondary | ICD-10-CM

## 2017-12-24 DIAGNOSIS — E1159 Type 2 diabetes mellitus with other circulatory complications: Secondary | ICD-10-CM

## 2017-12-24 DIAGNOSIS — E119 Type 2 diabetes mellitus without complications: Secondary | ICD-10-CM | POA: Diagnosis not present

## 2017-12-24 DIAGNOSIS — E785 Hyperlipidemia, unspecified: Secondary | ICD-10-CM | POA: Diagnosis not present

## 2017-12-24 DIAGNOSIS — E118 Type 2 diabetes mellitus with unspecified complications: Secondary | ICD-10-CM | POA: Diagnosis not present

## 2017-12-24 DIAGNOSIS — I152 Hypertension secondary to endocrine disorders: Secondary | ICD-10-CM

## 2017-12-24 LAB — POCT UA - MICROALBUMIN
Albumin/Creatinine Ratio, Urine, POC: 10.3
Creatinine, POC: 55.3 mg/dL
MICROALBUMIN (UR) POC: 5.7 mg/L

## 2017-12-24 LAB — POCT GLYCOSYLATED HEMOGLOBIN (HGB A1C): HEMOGLOBIN A1C: 6.6

## 2017-12-24 NOTE — Progress Notes (Signed)
  Subjective:    Patient ID: Deborah Travis, female    DOB: 17-Nov-1940, 77 y.o.   MRN: [id number]  Deborah Travis is a 77 y.o. female who presents for follow-up of Type 2 diabetes mellitus.  Patient is checking home blood sugars.   Home blood sugar records: meter records How often is blood sugars being checked: Twice a day Current symptoms/problems include none and have been unchanged. Daily foot checks: yes   Any foot concerns: no Last eye exam: 11/2017 Exercise: walking  She is now taking care of 2 great grandchildren which is keeping her quite busy.  She continues on her metformin and having no difficulty with that.  She is also taking lisinopril, Synthroid, pravastatin again no complaints of any particular concerns. The following portions of the patient's history were reviewed and updated as appropriate: allergies, current medications, past medical history, past social history and problem list.  ROS as in subjective above.     Objective:    Physical Exam Alert and in no distress foot exam normal.  Purpuric lesions noted on her forearms.   Lab Review Diabetic Labs Latest Ref Rng & Units 04/15/2017 10/17/2016 06/15/2016 02/10/2016 10/03/2015  HbA1c - 6.6 6.4 6.3 6.6 6.5  Microalbumin mg/L - - <5.0 - -  Micro/Creat Ratio - - - <19.0 - -  Chol 125 - 200 mg/dL - - 133 - -  HDL >=46 mg/dL - - 73 - -  Calc LDL <130 mg/dL - - 45 - -  Triglycerides <150 mg/dL - - 75 - -  Creatinine 0.60 - 0.93 mg/dL - - 1.12(H) - -   BP/Weight 11/18/2017 04/15/2017 10/17/2016 06/15/2016 0/32/1224  Systolic BP 825 003 [phone]  Diastolic BP 62 68 70 70 70  Wt. (Lbs) 118.2 113.8 111 116 111  BMI 20.94 20.16 19.66 20.55 19.67   Foot/eye exam completion dates Latest Ref Rng & Units 03/19/2017 06/15/2016  Eye Exam No Retinopathy No Retinopathy -  Foot Form Completion - - Done  A1c is 6.6  Deborah Travis  reports that she quit smoking about 2 years ago. She smoked 0.10 packs per day. She has never used smokeless  tobacco. She reports that she does not drink alcohol or use drugs.     Assessment & Plan:    Type 2 diabetes mellitus with complication, without long-term current use of insulin (HCC) - Plan: CBC with Differential/Platelet, Comprehensive metabolic panel, Lipid panel, POCT UA - Microalbumin, POCT glycosylated hemoglobin (Hb A1C)  Hyperlipidemia associated with type 2 diabetes mellitus ([place])  Hypertension associated with diabetes ([place]) - Plan: CBC with Differential/Platelet, Comprehensive metabolic panel  Hypothyroidism, unspecified type - Plan: TSH  Senile purpura (HCC)  Osteopenia of multiple sites - Plan: DG Bone Density    1. Rx changes: none 2. Education: Reviewed 'ABCs' of diabetes management (respective goals in parentheses):  A1C (<7), blood pressure (<130/80), and cholesterol (LDL <100). 3. Compliance at present is estimated to be good. Efforts to improve compliance (if necessary) will be directed at nothing. 4. Follow up: 4 months Overall she is taking good care of herself.  I did briefly discuss the stress that she is under.  She has a good handle on this.

## 2017-12-25 LAB — COMPREHENSIVE METABOLIC PANEL
ALBUMIN: 4 g/dL (ref 3.5–4.8)
ALK PHOS: 108 IU/L (ref 39–117)
ALT: 17 IU/L (ref 0–32)
AST: 17 IU/L (ref 0–40)
Albumin/Globulin Ratio: 1.5 (ref 1.2–2.2)
BUN / CREAT RATIO: 10 — AB (ref 12–28)
BUN: 12 mg/dL (ref 8–27)
Bilirubin Total: 0.3 mg/dL (ref 0.0–1.2)
CO2: 21 mmol/L (ref 20–29)
CREATININE: 1.15 mg/dL — AB (ref 0.57–1.00)
Calcium: 9 mg/dL (ref 8.7–10.3)
Chloride: 102 mmol/L (ref 96–106)
GFR calc Af Amer: 54 mL/min/{1.73_m2} — ABNORMAL LOW (ref >59)
GFR calc non Af Amer: 47 mL/min/{1.73_m2} — ABNORMAL LOW (ref >59)
GLUCOSE: 223 mg/dL — AB (ref 65–99)
Globulin, Total: 2.6 g/dL (ref 1.5–4.5)
Potassium: 4.9 mmol/L (ref 3.5–5.2)
Sodium: 138 mmol/L (ref 134–144)
Total Protein: 6.6 g/dL (ref 6.0–8.5)

## 2017-12-25 LAB — LIPID PANEL
CHOLESTEROL TOTAL: 94 mg/dL — AB (ref 100–199)
Chol/HDL Ratio: 2 ratio (ref 0.0–4.4)
HDL: 46 mg/dL (ref >39)
LDL Calculated: 33 mg/dL (ref 0–99)
TRIGLYCERIDES: 75 mg/dL (ref 0–149)
VLDL CHOLESTEROL CAL: 15 mg/dL (ref 5–40)

## 2017-12-25 LAB — CBC WITH DIFFERENTIAL/PLATELET
BASOS ABS: 0 10*3/uL (ref 0.0–0.2)
Basos: 0 %
EOS (ABSOLUTE): 0.1 10*3/uL (ref 0.0–0.4)
Eos: 1 %
HEMOGLOBIN: 12.5 g/dL (ref 11.1–15.9)
Hematocrit: 39 % (ref 34.0–46.6)
IMMATURE GRANS (ABS): 0 10*3/uL (ref 0.0–0.1)
Immature Granulocytes: 0 %
LYMPHS ABS: 2.2 10*3/uL (ref 0.7–3.1)
LYMPHS: 29 %
MCH: 30.9 pg (ref 26.6–33.0)
MCHC: 32.1 g/dL (ref 31.5–35.7)
MCV: 96 fL (ref 79–97)
MONOCYTES: 12 %
Monocytes Absolute: 0.9 10*3/uL (ref 0.1–0.9)
Neutrophils Absolute: 4.6 10*3/uL (ref 1.4–7.0)
Neutrophils: 58 %
Platelets: 288 10*3/uL (ref 150–379)
RBC: 4.05 x10E6/uL (ref 3.77–5.28)
RDW: 13.7 % (ref 12.3–15.4)
WBC: 7.8 10*3/uL (ref 3.4–10.8)

## 2017-12-25 LAB — TSH: TSH: 1.4 u[IU]/mL (ref 0.450–4.500)

## 2017-12-25 MED ORDER — LEVOTHYROXINE SODIUM 100 MCG PO TABS: 100.0000 ug | ORAL_TABLET | Freq: Every day | ORAL | 3 refills | Status: DC

## 2017-12-25 MED ORDER — LISINOPRIL 10 MG PO TABS: 10.0000 mg | ORAL_TABLET | Freq: Every day | ORAL | 3 refills | Status: DC

## 2017-12-25 MED ORDER — PRAVASTATIN SODIUM 20 MG PO TABS: 20.0000 mg | ORAL_TABLET | Freq: Every day | ORAL | 3 refills | Status: DC

## 2017-12-25 MED ORDER — METFORMIN HCL 1000 MG PO TABS: ORAL_TABLET | ORAL | 1 refills | Status: DC

## 2017-12-25 NOTE — Addendum Note (Signed)
Addended by: [name] on: 12/25/2017 07:59 AM   Modules accepted: Orders

## 2017-12-28 DIAGNOSIS — J019 Acute sinusitis, unspecified: Secondary | ICD-10-CM | POA: Diagnosis not present

## 2018-01-06 ENCOUNTER — Encounter: Payer: Self-pay | Admitting: Family Medicine

## 2018-01-06 ENCOUNTER — Ambulatory Visit (INDEPENDENT_AMBULATORY_CARE_PROVIDER_SITE_OTHER): Payer: Medicare HMO | Admitting: Family Medicine

## 2018-01-06 VITALS — BP 110/60 | HR 90 | Temp 97.9°F | Ht 62.75 in | Wt 111.8 lb

## 2018-01-06 DIAGNOSIS — M542 Cervicalgia: Secondary | ICD-10-CM | POA: Diagnosis not present

## 2018-01-06 MED ORDER — CYCLOBENZAPRINE HCL 5 MG PO TABS: 5.0000 mg | ORAL_TABLET | Freq: Three times a day (TID) | ORAL | 1 refills | Status: DC | PRN

## 2018-01-06 NOTE — Patient Instructions (Signed)
You can take 4 ibuprofen 3 times per day.  Heat to your neck for 20 minutes 3 times per day.  Do some gentle stretching after the heat.  Will call in a muscle relaxer

## 2018-01-06 NOTE — Progress Notes (Signed)
   Subjective:    Patient ID: Deborah Travis, female    DOB: July 04, 1941, 77 y.o.   MRN: [id number]  HPI 3 days ago she woke up with right-sided neck soreness that got much worse and became much more painful limiting the range of motion of her neck.  She states that the pain did shoot up into her head but did not go down her right arm.  Since then she has tried small amounts of Motrin and heat without much success.   Review of Systems     Objective:   Physical Exam Alert and right posterior muscles are tender to palpation with limitation of motion in all directions.  Normal motor, sensory and DTRs of her arms.       Assessment & Plan:  Neck pain on right side - Plan: cyclobenzaprine (FLEXERIL) 5 MG tablet I explained that this is not a pinched nerve and we need to conservatively treat this.  Recommend heat, stretching, anti-inflammatory and muscle relaxer.  Also mentioned the possible use of chiropractor and she will consider this.

## 2018-02-11 ENCOUNTER — Telehealth: Payer: Self-pay

## 2018-02-11 ENCOUNTER — Encounter: Payer: Self-pay | Admitting: Family Medicine

## 2018-02-11 ENCOUNTER — Telehealth: Payer: Self-pay | Admitting: Family Medicine

## 2018-02-11 ENCOUNTER — Ambulatory Visit (INDEPENDENT_AMBULATORY_CARE_PROVIDER_SITE_OTHER): Payer: Medicare HMO | Admitting: Family Medicine

## 2018-02-11 VITALS — BP 110/68 | HR 88 | Temp 97.6°F | Wt 113.4 lb

## 2018-02-11 DIAGNOSIS — M1990 Unspecified osteoarthritis, unspecified site: Secondary | ICD-10-CM

## 2018-02-11 NOTE — Telephone Encounter (Signed)
Let her know that the Lyme disease testing is not very accurate and her symptoms at this point make me think more rheumatologic.  If the tests I did today come back negative, I can always readdress this tomorrow

## 2018-02-11 NOTE — Telephone Encounter (Signed)
Done kh

## 2018-02-11 NOTE — Patient Instructions (Signed)
You can take up to 800 mg 3 times per day of ibuprofen

## 2018-02-11 NOTE — Telephone Encounter (Signed)
Pt called back to informed that dr Redmond [employer] does not think you need a lyme disease test due to them not being that accurate and your joint pain limited. [place].

## 2018-02-11 NOTE — Telephone Encounter (Signed)
Pt states that she is really concerned about Lyme's disease and said you had discussed it but wanted to ask again if  you were going to test her blood for Lyme disease also.  Her daughter who is a Marine scientist is also concerned and would like her tested.

## 2018-02-11 NOTE — Progress Notes (Signed)
   Subjective:    Patient ID: Deborah Travis, female    DOB: 1940-11-01, 77 y.o.   MRN: [id number]  HPI She is here for a recheck.  Her last visit she was having neck pain especially when she would rotate to the right.  She also now notes some discomfort in her thumb and fifth fingers but states that it tends to be bilateral.  She has also noted some swelling in her fingers and wrists as well as some shoulder discomfort.   Review of Systems     Objective:   Physical Exam Pain on motion of the neck especially with rotation to the right.  Shoulders are tender to palpation with pain on motion.  Elbow show no effusion.  Both wrists have an effusion.  She also has an effusion bilaterally in the thumb and index fingers.  Other joints and had not affected knees and ankles show no effusion.       Assessment & Plan:  Arthritis - Plan: Rheumatoid Arthritis Profile I explained that this could easily be a rheumatoid arthritis type picture.  She is to use up to 800 mg 3 times daily of ibuprofen.  Will wait on blood results and then referred to rheumatology.

## 2018-02-12 ENCOUNTER — Other Ambulatory Visit: Payer: Self-pay

## 2018-02-12 DIAGNOSIS — M25432 Effusion, left wrist: Principal | ICD-10-CM

## 2018-02-12 DIAGNOSIS — M25449 Effusion, unspecified hand: Secondary | ICD-10-CM

## 2018-02-12 DIAGNOSIS — M25431 Effusion, right wrist: Secondary | ICD-10-CM

## 2018-02-12 LAB — RA EXPANDED PROFILE
CRP: 2 mg/L (ref 0–10)
CYCLIC CITRULLIN PEPTIDE AB: 4 U (ref 0–19)
Rhuematoid fact SerPl-aCnc: <10 IU/mL (ref 0.0–13.9)
SED RATE: 20 mm/h (ref 0–40)

## 2018-02-18 ENCOUNTER — Telehealth: Payer: Self-pay | Admitting: Family Medicine

## 2018-02-18 ENCOUNTER — Ambulatory Visit
Admission: RE | Admit: 2018-02-18 | Discharge: 2018-02-18 | Disposition: A | Discharge status: Home / Self Care | Payer: Medicare HMO | Source: Ambulatory Visit | Attending: Family Medicine | Admitting: Family Medicine

## 2018-02-18 DIAGNOSIS — M8589 Other specified disorders of bone density and structure, multiple sites: Secondary | ICD-10-CM | POA: Diagnosis not present

## 2018-02-18 DIAGNOSIS — Z780 Asymptomatic menopausal state: Secondary | ICD-10-CM | POA: Diagnosis not present

## 2018-02-18 MED ORDER — TRAMADOL HCL 50 MG PO TABS: 50.0000 mg | ORAL_TABLET | Freq: Three times a day (TID) | ORAL | 0 refills | Status: DC | PRN

## 2018-02-18 NOTE — Telephone Encounter (Signed)
Let her know that I called medication in.  Let her know that if she has difficulty with itching to stop the medication and take Benadryl and call.

## 2018-02-18 NOTE — Telephone Encounter (Signed)
Pt states she is in a lot of pain and wants to know if you will call her [facility] [address]  She has appt with Dr Lynann Bologna on Monday ortho surgeon if she can't get in with [facility] Ortho Dr Patrecia Pour sooner.  [place] ortho told her it would be between July 1st and July 30 for new patients.

## 2018-02-18 NOTE — Telephone Encounter (Signed)
Pt aware/kh 

## 2018-02-18 NOTE — Progress Notes (Signed)
Office Visit Note  Patient: Deborah Travis             Date of Birth: 08-12-1941           MRN: [id number]             PCP: [name], MD Referring: [name], MD Visit Date: 02/19/2018 Occupation: @GUAROCC @    Subjective:  Pain in multiple joints.   History of Present Illness: Deborah Travis is a 77 y.o. female seen in consultation per request of her PCP.  According to patient her symptoms are started about 6 weeks ago with pain and stiffness in her C-spine.  She states she had decreased range of motion of her cervical spine and had difficulty turning her head.  She was seen by her PCP and was given muscle relaxers and advised to take ibuprofen.  She states the symptoms improved in her cervical spine but she continues to have some tingling in her cervical region.  At the same time she started having pain in her bilateral shoulders and swelling and pain in her bilateral wrist joints in her hands.  She states the swelling in her hands is getting worse.  She also complains of nocturnal pain.  She states Dr. Redmond School recently gave her a prescription of tramadol due to nocturnal pain.  Activities of Daily Living:  Patient reports morning stiffness for 5-10 minute.   Patient Denies nocturnal pain.  Difficulty dressing/grooming: Reports Difficulty climbing stairs: Denies Difficulty getting out of chair: Denies Difficulty using hands for taps, buttons, cutlery, and/or writing: Reports   Review of Systems  Constitutional: Positive for fatigue. Negative for fever, night sweats, weight gain and weight loss.  HENT: Negative for ear pain, mouth sores, trouble swallowing, trouble swallowing, mouth dryness and nose dryness.   Eyes: Negative for pain, redness, itching, visual disturbance and dryness.  Respiratory: Negative for cough, shortness of breath and difficulty breathing.   Cardiovascular: Negative for chest pain, palpitations, hypertension, irregular heartbeat and swelling in  legs/feet.  Gastrointestinal: Positive for heartburn. Negative for blood in stool, constipation and diarrhea.  Endocrine: Negative for increased urination.  Genitourinary: Negative for vaginal dryness.  Musculoskeletal: Positive for arthralgias, joint pain, joint swelling, muscle weakness and morning stiffness. Negative for myalgias, muscle tenderness and myalgias.  Skin: Positive for hair loss. Negative for color change, rash, skin tightness, ulcers and sensitivity to sunlight.  Allergic/Immunologic: Negative for susceptible to infections.  Neurological: Positive for dizziness. Negative for fainting, memory loss, night sweats and weakness.  Hematological: Positive for bruising/bleeding tendency. Negative for swollen glands.  Psychiatric/Behavioral: Positive for sleep disturbance. Negative for depressed mood. The patient is not nervous/anxious.     PMFS History:  Patient Active Problem List   Diagnosis Date Noted  . Senile purpura ([place]) 02/01/2015  . Former smoker 10/12/2013  . History of peptic ulcer disease 07/13/2013  . Osteopenia 05/07/2013  . Hypothyroidism 01/12/2012  . Type 2 diabetes with complication ([place]) 03/54/6568  . Hypertension associated with diabetes ([place]) 05/21/2011  . GERD (gastroesophageal reflux disease) 05/21/2011  . Hyperlipidemia associated with type 2 diabetes mellitus ([place]) 05/21/2011    Past Medical History:  Diagnosis Date  . Chronic kidney disease    RENAL STONE  . Chronic pancreatitis ([place])   . Diabetes mellitus   . Duodenal ulcer   . Dyslipidemia   . GERD (gastroesophageal reflux disease)   . Hypertension   . Osteopenia   . Thyroid disease    HYPOTHYROID  .  Vitamin D deficiency     Family History  Problem Relation Age of Onset  . Stroke Mother   . Heart disease Father   . Diabetes Sister    Past Surgical History:  Procedure Laterality Date  . APPENDECTOMY    . CATARACT EXTRACTION Right 2014  . CHOLECYSTECTOMY    . TONSILECTOMY,  ADENOIDECTOMY, BILATERAL MYRINGOTOMY AND TUBES    . [place]   Social History   Social History Narrative  . Not on file     Objective: Vital Signs: BP 108/76 (BP Location: Left Arm, Patient Position: Sitting, Cuff Size: Normal)   Pulse 66   Ht 5\' 2"  (1.575 m)   Wt 113 lb (51.3 kg)   BMI 20.67 kg/m    Physical Exam  Constitutional: She is oriented to person, place, and time. She appears well-developed and well-nourished.  HENT:  Head: Normocephalic and atraumatic.  Eyes: Conjunctivae and EOM are normal.  Neck: Normal range of motion.  Cardiovascular: Normal rate, regular rhythm, normal heart sounds and intact distal pulses.  Pulmonary/Chest: Effort normal and breath sounds normal.  Abdominal: Soft. Bowel sounds are normal.  Lymphadenopathy:    She has no cervical adenopathy.  Neurological: She is alert and oriented to person, place, and time.  Skin: Skin is warm and dry. Capillary refill takes less than 2 seconds.  Psychiatric: She has a normal mood and affect. Her behavior is normal.  Nursing note and vitals reviewed.    Musculoskeletal Exam: She had limited range of motion of her cervical spine.  Thoracic and lumbar spine good range of motion.  She had painful range of motion of bilateral shoulder joints although it was full.  She has no warmth swelling or effusion in her elbow joints.  She has warmth swelling and an synovitis in her bilateral wrist joints with decreased range of motion.  She also has synovitis in some of the MCPs and PIPs as described below.  Hip joints knee joints ankles MTPs PIPs were in good range of motion with no synovitis.  CDAI Exam: CDAI Homunculus Exam:   Tenderness:  RUE: glenohumeral and wrist LUE: glenohumeral and wrist Right hand: 1st MCP, 2nd MCP and 3rd PIP Left hand: 1st MCP and 2nd PIP  Swelling:  RUE: wrist LUE: wrist Right hand: 1st MCP, 2nd MCP and 3rd PIP Left hand: 1st MCP and 2nd PIP  Joint Counts:  CDAI  Tender Joint count: 9 CDAI Swollen Joint count: 7     Investigation: Chest x-ray March 2017 was unremarkable. Findings:  02/11/18: RF <10, CRP 2, CCP 4, Sed rate 2    Component     Latest Ref Rng & Units 02/11/2018  RA Latex Turbid.     0.0 - 13.9 IU/mL <10.0  CRP     0 - 10 mg/L 2  Cyclic Citrullin Peptide Ab     0 - 19 units 4  Sed Rate     0 - 40 mm/hr 20   CBC Latest Ref Rng & Units 12/24/2017 06/15/2016 11/08/2015  WBC 3.4 - 10.8 x10E3/uL 7.8 9.6 9.8  Hemoglobin 11.1 - 15.9 g/dL 12.5 12.4 13.4  Hematocrit 34.0 - 46.6 % 39.0 38.4 39.7  Platelets 150 - 379 x10E3/uL 288 317 380   CMP Latest Ref Rng & Units 12/24/2017 06/15/2016 06/01/2015  Glucose 65 - 99 mg/dL 223(H) 105(H) 196(H)  BUN 8 - 27 mg/dL 12 14 18   Creatinine 0.57 - 1.00 mg/dL 1.15(H) 1.12(H) 1.03(H)  Sodium 134 -  144 mmol/L 138 137 135  Potassium 3.5 - 5.2 mmol/L 4.9 5.2 4.9  Chloride 96 - 106 mmol/L 102 107 108  CO2 20 - 29 mmol/L 21 18(L) 20  Calcium 8.7 - 10.3 mg/dL 9.0 8.9 9.0  Total Protein 6.0 - 8.5 g/dL 6.6 7.1 6.4  Total Bilirubin 0.0 - 1.2 mg/dL 0.3 0.3 0.4  Alkaline Phos 39 - 117 IU/L 108 91 71  AST 0 - 40 IU/L 17 18 20   ALT 0 - 32 IU/L 17 13 16      Imaging: Dg Bone Density  Result Date: 02/18/2018 EXAM: DUAL X-RAY ABSORPTIOMETRY (DXA) FOR BONE MINERAL DENSITY IMPRESSION: Referring Physician:  [name] PATIENT: Name: Chauna, Osoria Patient ID: [id number] Birth Date: [birth year] Height: 62.8 in. Sex: Female Measured: 02/18/2018 Weight: 112.6 lbs. Indications: Advanced Age, Caucasian, Estrogen Deficient, Hypothyroid, Levothyroxine, Postmenopausal Fractures: None Treatments: Calcium (E943.0), Vitamin D (E933.5) ASSESSMENT: The BMD measured at Femur Total Right is 0.700 g/cm2 with a T-score of -2.4. This patient is considered osteopenic according to [facility]) criteria. This scan is good quality. L-2 and L-3 were excluded due to surgical hardware. Site Region Measured Date  Measured Age YA BMD Significant CHANGE T-score DualFemur Total Right 02/18/2018 74.6 -2.4 0.700 g/cm2 AP Spine L1-L4 (L2,L3) 02/18/2018 74.6 -1.8 0.970 g/cm2 World Health Organization [facility]) criteria for post-menopausal, Caucasian Women: Normal       T-score at or above -1 SD Osteopenia   T-score between -1 and -2.5 SD Osteoporosis T-score at or below -2.5 SD RECOMMENDATION: 1. All patients should optimize calcium and vitamin D intake. 2. Consider FDA approved medical therapies in postmenopausal women and men aged 18 years and older, based on the following: a. A hip or vertebral (clinical or morphometric) fracture b. T- score < or = -2.5 at the femoral neck or spine after appropriate evaluation to exclude secondary causes c. Low bone mass (T-score between -1.0 and -2.5 at the femoral neck or spine) and a 10 year probability of a hip fracture > or = 3% or a 10 year probability of a major osteoporosis-related fracture > or = 20% based on the [place]-adapted WHO algorithm d. Clinician judgment and/or patient preferences may indicate treatment for people with 10-year fracture probabilities above or below these levels FOLLOW-UP: People with diagnosed cases of osteoporosis or at high risk for fracture should have regular bone mineral density tests. For patients eligible for Medicare, routine testing is allowed once every 2 years. The testing frequency can be increased to one year for patients who have rapidly progressing disease, those who are receiving or discontinuing medical therapy to restore bone mass, or have additional risk factors. I have reviewed this report and agree with the above findings. [facility] FRAX* 10-year Probability of Fracture Based on femoral neck BMD: DualFemur (Left) Major Osteoporotic Fracture: 10.5% Hip Fracture:                2.5% Population:                  [place] (Caucasian) Risk Factors:                None *FRAX is a Materials engineer of the [employer] of [employer] for  Metabolic Bone Disease, a World Pharmacologist (WHO) [facility]. ASSESSMENT: The probability of a major osteoporotic fracture is 10.5 % within the next ten years. The probability of hip fracture is  2.5  % within the next 10 years. Electronically Signed  By: Van Clines M.D.   On: 02/18/2018 13:14   Xr Cervical Spine 2 Or 3 Views  Result Date: 02/19/2018 Multilevel spondylosis was noted.  C4-5, C5-6 narrowing was noted.  Facet joint arthropathy was noted. Impression: These findings are consistent with DDD of cervical spine with C4-5 and C5-6 significant narrowing.  Xr Hand 2 View Left  Result Date: 02/19/2018 CMC, PIP and DIP narrowing was noted.  Mild juxta-articular osteopenia was noted.  No significant MCP narrowing, intercarpal or radiocarpal joint space narrowing was noted.  Possible cystic versus erosive changes noted around the carpal bones. Impression: These findings consistent with osteoarthritis and inflammatory arthritis.  Xr Hand 2 View Right  Result Date: 02/19/2018 Right CMC narrowing was noted.  All PIP and DIP narrowing was noted.  No MCP, metacarpocarpal, intercarpal, radiocarpal joint space narrowing was noted.  No erosive changes were noted. Impression: These findings are consistent with osteoarthritis of the hand.   Speciality Comments: No specialty comments available.    Procedures:  No procedures performed Allergies: Codeine and Morphine and related   Assessment / Plan:     Visit Diagnoses: Pain in both hands - 02/11/18: RF <10, CRP 2, CCP 4, Sed rate 2  -patient has synovitis in her bilateral wrist joints and hands as described above.  Her rheumatoid factor and anti-CCP was negative.  Although the clinical findings are more consistent with seronegative rheumatoid arthritis.  I will obtain following labs to look for any other autoimmune causes.  She has severe pain and discomfort.  She would benefit for short-term prednisone.  After indications side  effects contraindications were discussed she was placed on prednisone 10 mg p.o. daily.  We will taper prednisone once she starts on some additional medication.  We also contacted Dr. Lanice Shirts office.  She will be placed on insulin.  She will be monitoring her glucose closely.  Once her lab results are available and we think that her clinical findings are consistent with seronegative rheumatoid arthritis I may consider use of Arava.  I would avoid methotrexate due to elevation of creatinine.  I have given her a handout to review in case we have to start on Arava.  For her the starting dose will be 10 mg p.o. daily for 2 weeks and if she tolerates that we will increase it to 20 mg p.o. daily.  We will check labs every 2 weeks x 2 and then every 2 months.  Patient states that she has had pneumococcal and Shingrix vaccine.  We also reviewed a chest x-ray obtained on her which was normal.  Plan: XR Hand 2 View Right, XR Hand 2 View Left, Urinalysis, Routine w reflex microscopic, Uric acid, ANA, 14-3-3 eta Protein, B. burgdorfi antibodies, Parvovirus B19 antibody, IgG and IgM   Chronic pain of both shoulders-she has pain and discomfort with moving bilateral shoulder joints.  Pain, neck - Plan: XR Cervical Spine 2 or 3 views.  The x-ray of the cervical scan was consistent with DDD of the cervical spine.  She recently had a lot of pain and stiffness in her cervical spine which could be part of the autoimmune process.  High risk medication use - Plan: CBC with Differential/Platelet, COMPLETE METABOLIC PANEL WITH GFR, Hepatitis B core antibody, IgM, Hepatitis B surface antigen, Hepatitis C antibody, QuantiFERON-TB Gold Plus, HIV antibody, Serum protein electrophoresis with reflex, IgG, IgA, IgM, Glucose 6 phosphate dehydrogenase, Thiopurine methyltransferase(tpmt)rbc  Osteopenia of multiple sites-she is on calcium and vitamin D.  Vitamin D  deficiency  Hypertension associated with diabetes (HCC)-side effects of  prednisone including elevation of blood pressure and elevation of blood sugar was discussed.  She will be following up with her PCP closely.  Hyperlipidemia associated with type 2 diabetes mellitus ([place])  History of gastroesophageal reflux (GERD)  History of peptic ulcer disease  Senile purpura ([place])  History of hypothyroidism  History of chronic pancreatitis  Former smoker - quit 2017, 1/2 ppd x 50 years    Orders: Orders Placed This Encounter  Procedures  . XR Hand 2 View Right  . XR Hand 2 View Left  . XR Cervical Spine 2 or 3 views  . CBC with Differential/Platelet  . COMPLETE METABOLIC PANEL WITH GFR  . Urinalysis, Routine w reflex microscopic  . Uric acid  . ANA  . 14-3-3 eta Protein  . B. burgdorfi antibodies  . Parvovirus B19 antibody, IgG and IgM  . Hepatitis B core antibody, IgM  . Hepatitis B surface antigen  . Hepatitis C antibody  . QuantiFERON-TB Gold Plus  . HIV antibody  . Serum protein electrophoresis with reflex  . IgG, IgA, IgM  . Glucose 6 phosphate dehydrogenase  . Thiopurine methyltransferase(tpmt)rbc   Meds ordered this encounter  Medications  . predniSONE (DELTASONE) 5 MG tablet    Sig: Take 2 tablets (10 mg total) by mouth daily with breakfast.    Dispense:  60 tablet    Refill:  0    Face-to-face time spent with patient was 60 minutes. Greater than 50% of time was spent in counseling and coordination of care.  Follow-Up Instructions: Return in about 2 weeks (around 03/05/2018) for Inflammatory arthritis.   [name], MD  Note - This record has been created using Editor, commissioning.  Chart creation errors have been sought, but may not always  have been located. Such creation errors do not reflect on  the standard of medical care.

## 2018-02-19 ENCOUNTER — Ambulatory Visit: Payer: Medicare HMO | Admitting: Rheumatology

## 2018-02-19 ENCOUNTER — Ambulatory Visit (INDEPENDENT_AMBULATORY_CARE_PROVIDER_SITE_OTHER): Payer: Self-pay

## 2018-02-19 ENCOUNTER — Other Ambulatory Visit: Payer: Self-pay

## 2018-02-19 ENCOUNTER — Encounter: Payer: Self-pay | Admitting: Rheumatology

## 2018-02-19 VITALS — BP 108/76 | HR 66 | Ht 62.0 in | Wt 113.0 lb

## 2018-02-19 DIAGNOSIS — M25511 Pain in right shoulder: Secondary | ICD-10-CM | POA: Diagnosis not present

## 2018-02-19 DIAGNOSIS — E1169 Type 2 diabetes mellitus with other specified complication: Secondary | ICD-10-CM

## 2018-02-19 DIAGNOSIS — M25512 Pain in left shoulder: Secondary | ICD-10-CM

## 2018-02-19 DIAGNOSIS — D692 Other nonthrombocytopenic purpura: Secondary | ICD-10-CM | POA: Diagnosis not present

## 2018-02-19 DIAGNOSIS — Z8639 Personal history of other endocrine, nutritional and metabolic disease: Secondary | ICD-10-CM | POA: Diagnosis not present

## 2018-02-19 DIAGNOSIS — Z8719 Personal history of other diseases of the digestive system: Secondary | ICD-10-CM | POA: Diagnosis not present

## 2018-02-19 DIAGNOSIS — Z8711 Personal history of peptic ulcer disease: Secondary | ICD-10-CM

## 2018-02-19 DIAGNOSIS — G8929 Other chronic pain: Secondary | ICD-10-CM

## 2018-02-19 DIAGNOSIS — E785 Hyperlipidemia, unspecified: Secondary | ICD-10-CM

## 2018-02-19 DIAGNOSIS — E1159 Type 2 diabetes mellitus with other circulatory complications: Secondary | ICD-10-CM

## 2018-02-19 DIAGNOSIS — M79641 Pain in right hand: Secondary | ICD-10-CM | POA: Diagnosis not present

## 2018-02-19 DIAGNOSIS — M79642 Pain in left hand: Secondary | ICD-10-CM

## 2018-02-19 DIAGNOSIS — I10 Essential (primary) hypertension: Secondary | ICD-10-CM

## 2018-02-19 DIAGNOSIS — M8589 Other specified disorders of bone density and structure, multiple sites: Secondary | ICD-10-CM

## 2018-02-19 DIAGNOSIS — E559 Vitamin D deficiency, unspecified: Secondary | ICD-10-CM | POA: Diagnosis not present

## 2018-02-19 DIAGNOSIS — M542 Cervicalgia: Secondary | ICD-10-CM

## 2018-02-19 DIAGNOSIS — Z87891 Personal history of nicotine dependence: Secondary | ICD-10-CM

## 2018-02-19 DIAGNOSIS — I152 Hypertension secondary to endocrine disorders: Secondary | ICD-10-CM

## 2018-02-19 DIAGNOSIS — Z79899 Other long term (current) drug therapy: Secondary | ICD-10-CM

## 2018-02-19 MED ORDER — INSULIN PEN NEEDLE 32G X 6 MM MISC: 1.0000 | Freq: Once | 0 refills | Status: AC

## 2018-02-19 MED ORDER — INSULIN GLARGINE 100 UNIT/ML ~~LOC~~ SOLN: 10.0000 [IU] | Freq: Every day | SUBCUTANEOUS | 3 refills | Status: DC

## 2018-02-19 MED ORDER — INSULIN GLARGINE 100 UNIT/ML ~~LOC~~ SOLN: 10.0000 [IU] | Freq: Every day | SUBCUTANEOUS | 1 refills | Status: DC

## 2018-02-19 MED ORDER — PREDNISONE 5 MG PO TABS: 10.0000 mg | ORAL_TABLET | Freq: Every day | ORAL | 0 refills | Status: DC

## 2018-02-19 NOTE — Progress Notes (Signed)
ERROR

## 2018-02-19 NOTE — Patient Instructions (Addendum)
Standing Labs We placed an order today for your standing lab work.    Please come back and get your standing labs in 2 weeks x 2 after starting [place] and then every 2 months  We have open lab Monday through Friday from 8:30-11:30 AM and 1:30-4:00 PM  at the office of Dr. [name].   You may experience shorter wait times on Monday and Friday afternoons. The office is located at [address], [place], [place], [place] [address] No appointment is necessary.   Labs are drawn by [facility].  You may receive a bill from [place] for your lab work. If you have any questions regarding directions or hours of operation,  please call [phone].        Leflunomide tablets What is this medicine? LEFLUNOMIDE (le FLOO na mide) is for rheumatoid arthritis. This medicine may be used for other purposes; ask your health care provider or pharmacist if you have questions. COMMON BRAND NAME(S): Arava What should I tell my health care provider before I take this medicine? They need to know if you have any of these conditions: -alcoholism -bone marrow problems -fever or infection -immune system problems -kidney disease -liver disease -an unusual or allergic reaction to leflunomide, teriflunomide, other medicines, lactose, foods, dyes, or preservatives -pregnant or trying to get pregnant -breast-feeding How should I use this medicine? Take this medicine by mouth with a full glass of water. Follow the directions on the prescription label. Take your medicine at regular intervals. Do not take your medicine more often than directed. Do not stop taking except on your doctor's advice. Talk to your pediatrician regarding the use of this medicine in children. Special care may be needed. Overdosage: If you think you have taken too much of this medicine contact a poison control center or emergency room at once. NOTE: This medicine is only for you. Do not share this medicine with others. What if I miss  a dose? If you miss a dose, take it as soon as you can. If it is almost time for your next dose, take only that dose. Do not take double or extra doses. What may interact with this medicine? Do not take this medicine with any of the following medications: -teriflunomide This medicine may also interact with the following medications: -charcoal -cholestyramine -methotrexate -NSAIDs, medicines for pain and inflammation, like ibuprofen or naproxen -phenytoin -rifampin -tolbutamide -vaccines -warfarin This list may not describe all possible interactions. Give your health care provider a list of all the medicines, herbs, non-prescription drugs, or dietary supplements you use. Also tell them if you smoke, drink alcohol, or use illegal drugs. Some items may interact with your medicine. What should I watch for while using this medicine? Visit your doctor or health care professional for regular checks on your progress. You will need frequent blood checks while you are receiving the medicine. If you get a cold or other infection while receiving this medicine, call your doctor or health care professional. Do not treat yourself. The medicine may increase your risk of getting an infection. If you are a woman who has the potential to become pregnant, discuss birth control options with your doctor or health care professional. Dennis Bast must not be pregnant, and you must be using a reliable form of birth control. The medicine may harm an unborn baby. Immediately call your doctor if you think you might be pregnant. Alcoholic drinks may increase possible damage to your liver. Do not drink alcohol while taking this medicine. What  side effects may I notice from receiving this medicine? Side effects that you should report to your doctor or health care professional as soon as possible: -allergic reactions like skin rash, itching or hives, swelling of the face, lips, or tongue -cough -difficulty breathing or shortness of  breath -fever, chills or any other sign of infection -redness, blistering, peeling or loosening of the skin, including inside the mouth -unusual bleeding or bruising -unusually weak or tired -vomiting -yellowing of eyes or skin Side effects that usually do not require medical attention (report to your doctor or health care professional if they continue or are bothersome): -diarrhea -hair loss -headache -nausea This list may not describe all possible side effects. Call your doctor for medical advice about side effects. You may report side effects to FDA at 1-800-FDA-1088. Where should I keep my medicine? Keep out of the reach of children. Store at room temperature between 15 and 30 degrees C (59 and 86 degrees F). Protect from moisture and light. Throw away any unused medicine after the expiration date. NOTE: This sheet is a summary. It may not cover all possible information. If you have questions about this medicine, talk to your doctor, pharmacist, or health care provider.  2018 Elsevier/Gold Standard (2013-08-11 10:53:11)

## 2018-02-20 ENCOUNTER — Other Ambulatory Visit: Payer: Self-pay

## 2018-02-20 ENCOUNTER — Telehealth: Payer: Self-pay | Admitting: Family Medicine

## 2018-02-20 MED ORDER — ACCU-CHEK FASTCLIX LANCETS MISC: 1.0000 | Freq: Two times a day (BID) | 3 refills | Status: DC

## 2018-02-20 MED ORDER — GLUCOSE BLOOD VI STRP: ORAL_STRIP | 3 refills | Status: DC

## 2018-02-20 NOTE — Telephone Encounter (Signed)
Pt was called and she will come by and pick up her meter and I will place an order or test strips and lancets. [place]

## 2018-02-20 NOTE — Telephone Encounter (Signed)
Pt requesting glucose meter, strips, and lancets from [facility]. Will need whichever meter that [facility] will cover & she need this asap.

## 2018-02-24 ENCOUNTER — Other Ambulatory Visit: Payer: Self-pay

## 2018-02-24 ENCOUNTER — Telehealth: Payer: Self-pay | Admitting: Family Medicine

## 2018-02-24 MED ORDER — ACCU-CHEK AVIVA PLUS W/DEVICE KIT: PACK | 0 refills | Status: DC

## 2018-02-24 NOTE — Telephone Encounter (Signed)
[facility] req new rx for ACCU check Aviva Plus strips and lancets and also meter needed.

## 2018-02-24 NOTE — Telephone Encounter (Signed)
Pt was advised that her old meter was called in and she can go pick it up. [place]

## 2018-02-24 NOTE — Progress Notes (Signed)
Office Visit Note  Patient: Deborah Travis             Date of Birth: 1940/09/01           MRN: [id number]             PCP: [name], MD Referring: [name], MD Visit Date: 03/04/2018 Occupation: @GUAROCC @    Subjective:  Pain in multiple joints.   History of Present Illness: Deborah Travis is a 77 y.o. female with seronegative inflammatory arthritis.  She states she did better while she was on prednisone 10 mg p.o. daily.  She continues to have discomfort in her right shoulder, bilateral hands and her C-spine.  The swelling in her bilateral wrist has resolved.  Activities of Daily Living:  Patient reports morning stiffness for 1 hour.   Patient Reports nocturnal pain.  Difficulty dressing/grooming: Reports Difficulty climbing stairs: Denies Difficulty getting out of chair: Reports Difficulty using hands for taps, buttons, cutlery, and/or writing: Reports   Review of Systems  Constitutional: Positive for fatigue. Negative for activity change.  HENT: Negative for mouth sores and mouth dryness.   Eyes: Negative for dryness.  Respiratory: Negative for shortness of breath and difficulty breathing.   Cardiovascular: Negative for chest pain and swelling in legs/feet.  Gastrointestinal: Negative for abdominal pain, constipation and diarrhea.  Endocrine: Negative for increased urination.  Genitourinary: Negative for pelvic pain.  Musculoskeletal: Positive for arthralgias, joint pain, joint swelling and morning stiffness.  Skin: Positive for hair loss. Negative for rash.  Allergic/Immunologic: Negative for susceptible to infections.  Neurological: Positive for dizziness, light-headedness and weakness. Negative for numbness, headaches and memory loss.  Hematological: Positive for bruising/bleeding tendency.  Psychiatric/Behavioral: Negative for confusion.    PMFS History:  Patient Active Problem List   Diagnosis Date Noted  . Senile purpura ([place]) 02/01/2015  .  Former smoker 10/12/2013  . History of peptic ulcer disease 07/13/2013  . Osteopenia 05/07/2013  . Hypothyroidism 01/12/2012  . Type 2 diabetes with complication ([place]) 24/40/1027  . Hypertension associated with diabetes ([place]) 05/21/2011  . GERD (gastroesophageal reflux disease) 05/21/2011  . Hyperlipidemia associated with type 2 diabetes mellitus ([place]) 05/21/2011    Past Medical History:  Diagnosis Date  . Chronic kidney disease    RENAL STONE  . Chronic pancreatitis ([place])   . Diabetes mellitus   . Duodenal ulcer   . Dyslipidemia   . GERD (gastroesophageal reflux disease)   . Hypertension   . Osteopenia   . Thyroid disease    HYPOTHYROID  . Vitamin D deficiency     Family History  Problem Relation Age of Onset  . Stroke Mother   . Heart disease Father   . Healthy Daughter   . Healthy Daughter    Past Surgical History:  Procedure Laterality Date  . APPENDECTOMY    . CATARACT EXTRACTION Right 2014  . CHOLECYSTECTOMY    . TONSILECTOMY, ADENOIDECTOMY, BILATERAL MYRINGOTOMY AND TUBES    . [place]   Social History   Social History Narrative  . Not on file     Objective: Vital Signs: BP 106/66 (BP Location: Left Arm, Patient Position: Sitting, Cuff Size: Normal)   Pulse 74   Resp 12   Ht 5' 2.75" (1.594 m)   Wt 112 lb (50.8 kg)   BMI 20.00 kg/m    Physical Exam  Constitutional: She is oriented to person, place, and time. She appears well-developed and well-nourished.  HENT:  Head: Normocephalic and atraumatic.  Eyes: Conjunctivae and EOM are normal.  Neck: Normal range of motion.  Cardiovascular: Normal rate, regular rhythm, normal heart sounds and intact distal pulses.  Pulmonary/Chest: Effort normal and breath sounds normal.  Abdominal: Soft. Bowel sounds are normal.  Lymphadenopathy:    She has no cervical adenopathy.  Neurological: She is alert and oriented to person, place, and time.  Skin: Skin is warm and dry. Capillary refill takes  less than 2 seconds.  Psychiatric: She has a normal mood and affect. Her behavior is normal.  Nursing note and vitals reviewed.    Musculoskeletal Exam: Patient had limited range of motion of her cervical spine with discomfort.  Shoulder joints were in good range of motion with discomfort.  Elbow joints were in good range of motion.  She has synovitis of her bilateral wrist joints.  She also has synovitis over MCPs and PIPs as described below.  Hip joints knee joints ankles MTPs PIPs were in good range of motion with no synovitis.  CDAI Exam: CDAI Homunculus Exam:   Tenderness:  RUE: glenohumeral and wrist LUE: glenohumeral and wrist Right hand: 1st MCP and 2nd PIP Left hand: 3rd PIP  Swelling:  RUE: wrist LUE: wrist Right hand: 2nd PIP Left hand: 3rd PIP  Joint Counts:  CDAI Tender Joint count: 7 CDAI Swollen Joint count: 4  Global Assessments:  Patient Global Assessment: 7 Provider Global Assessment: 7  CDAI Calculated Score: 25    Investigation: Findings:  02/19/18: TB gold negative, IgM 26, parvovirus IgG 6.8, hep C negative, hep B-, HIV negative, G6PD 15.7, ANA negative, uric acid 4.8   Component     Latest Ref Rng & Units 02/19/2018  Total Protein     6.1 - 8.1 g/dL 6.9  Albumin ELP     3.8 - 4.8 g/dL 3.5 (L)  Alpha 1     0.2 - 0.3 g/dL 0.5 (H)  Alpha 2     0.5 - 0.9 g/dL 1.0 (H)  Beta Globulin     0.4 - 0.6 g/dL 0.5  Beta 2     0.2 - 0.5 g/dL 0.4  Gamma Globulin     0.8 - 1.7 g/dL 1.0  Abnormal Protein Band1     NONE DETEC g/dL NOTE  SPE Interp.        QuantiFERON-TB Gold Plus     NEGATIVE NEGATIVE  NIL     IU/mL 0.09  Mitogen-NIL     IU/mL 8.94  TB1-NIL     IU/mL 0.00  TB2-NIL     IU/mL 0.00  Immunoglobulin A     81 - 463 mg/dL 339  IgG (Immunoglobin G), Serum     694 - 1,618 mg/dL 1,083  IgM, Serum     48 - 271 mg/dL 26 (L)  Parvovirus B19, IgG     <0.9 6.8 (H)  Parvovirus B19, IgM     <0.9 0.2  Hepatitis C Ab     NON-REACTI  NON-REACTIVE  SIGNAL TO CUT-OFF     <1.00 0.01  Uric Acid, Serum     2.5 - 7.0 mg/dL 4.8  Anti Nuclear Antibody(ANA)     NEGATIVE NEGATIVE  14-3-3 eta Protein     <0.2 ng/mL <0.2  B burgdorferi Ab IgG+IgM     index <0.90  Hep B Core Ab, IgM     NON-REACTI NON-REACTIVE  Hepatitis B Surface Ag     NON-REACTI NON-REACTIVE  HIV     NON-REACTI NON-REACTIVE  G-6PDH     7.0 - 20.5 U/g Hgb 15.7   CBC Latest Ref Rng & Units 02/19/2018 12/24/2017 06/15/2016  WBC 3.8 - 10.8 Thousand/uL 11.0(H) 7.8 9.6  Hemoglobin 11.7 - 15.5 g/dL 12.4 12.5 12.4  Hematocrit 35.0 - 45.0 % 36.9 39.0 38.4  Platelets 140 - 400 Thousand/uL 352 288 317   CMP Latest Ref Rng & Units 02/19/2018 02/19/2018 12/24/2017  Glucose 65 - 99 mg/dL - 86 223(H)  BUN 7 - 25 mg/dL - 18 12  Creatinine 0.60 - 0.93 mg/dL - 1.18(H) 1.15(H)  Sodium 135 - 146 mmol/L - 139 138  Potassium 3.5 - 5.3 mmol/L - 5.9(H) 4.9  Chloride 98 - 110 mmol/L - 105 102  CO2 20 - 32 mmol/L - 24 21  Calcium 8.6 - 10.4 mg/dL - 9.4 9.0  Total Protein 6.1 - 8.1 g/dL 6.9 6.7 6.6  Total Bilirubin 0.2 - 1.2 mg/dL - 0.4 0.3  Alkaline Phos 39 - 117 IU/L - - 108  AST 10 - 35 U/L - 13 17  ALT 6 - 29 U/L - 12 17     Imaging: Dg Bone Density  Result Date: 02/18/2018 EXAM: DUAL X-RAY ABSORPTIOMETRY (DXA) FOR BONE MINERAL DENSITY IMPRESSION: Referring Physician:  [name] PATIENT: Name: Clydell, Alberts Patient ID: [id number] Birth Date: 1940/10/10 Height: 62.8 in. Sex: Female Measured: 02/18/2018 Weight: 112.6 lbs. Indications: Advanced Age, Caucasian, Estrogen Deficient, Hypothyroid, Levothyroxine, Postmenopausal Fractures: None Treatments: Calcium (E943.0), Vitamin D (E933.5) ASSESSMENT: The BMD measured at Femur Total Right is 0.700 g/cm2 with a T-score of -2.4. This patient is considered osteopenic according to [facility]) criteria. This scan is good quality. L-2 and L-3 were excluded due to surgical hardware. Site Region Measured Date  Measured Age YA BMD Significant CHANGE T-score DualFemur Total Right 02/18/2018 74.6 -2.4 0.700 g/cm2 AP Spine L1-L4 (L2,L3) 02/18/2018 74.6 -1.6 0.970 g/cm2 World Health Organization [facility]) criteria for post-menopausal, Caucasian Women: Normal       T-score at or above -1 SD Osteopenia   T-score between -1 and -2.5 SD Osteoporosis T-score at or below -2.5 SD RECOMMENDATION: 1. All patients should optimize calcium and vitamin D intake. 2. Consider FDA approved medical therapies in postmenopausal women and men aged 70 years and older, based on the following: a. A hip or vertebral (clinical or morphometric) fracture b. T- score < or = -2.5 at the femoral neck or spine after appropriate evaluation to exclude secondary causes c. Low bone mass (T-score between -1.0 and -2.5 at the femoral neck or spine) and a 10 year probability of a hip fracture > or = 3% or a 10 year probability of a major osteoporosis-related fracture > or = 20% based on the [place]-adapted WHO algorithm d. Clinician judgment and/or patient preferences may indicate treatment for people with 10-year fracture probabilities above or below these levels FOLLOW-UP: People with diagnosed cases of osteoporosis or at high risk for fracture should have regular bone mineral density tests. For patients eligible for Medicare, routine testing is allowed once every 2 years. The testing frequency can be increased to one year for patients who have rapidly progressing disease, those who are receiving or discontinuing medical therapy to restore bone mass, or have additional risk factors. I have reviewed this report and agree with the above findings. [facility] FRAX* 10-year Probability of Fracture Based on femoral neck BMD: DualFemur (Left) Major Osteoporotic Fracture: 10.5% Hip Fracture:  2.5% Population:                  [place] (Caucasian) Risk Factors:                None *FRAX is a Materials engineer of the [employer] for  Metabolic Bone Disease, a [place] (WHO) [facility]. ASSESSMENT: The probability of a major osteoporotic fracture is 10.5 % within the next ten years. The probability of hip fracture is  2.5  % within the next 10 years. Electronically Signed   By: Van Clines M.D.   On: 02/18/2018 13:14   Xr Cervical Spine 2 Or 3 Views  Result Date: 02/19/2018 Multilevel spondylosis was noted.  C4-5, C5-6 narrowing was noted.  Facet joint arthropathy was noted. Impression: These findings are consistent with DDD of cervical spine with C4-5 and C5-6 significant narrowing.  Xr Hand 2 View Left  Result Date: 02/19/2018 CMC, PIP and DIP narrowing was noted.  Mild juxta-articular osteopenia was noted.  No significant MCP narrowing, intercarpal or radiocarpal joint space narrowing was noted.  Possible cystic versus erosive changes noted around the carpal bones. Impression: These findings consistent with osteoarthritis and inflammatory arthritis.  Xr Hand 2 View Right  Result Date: 02/19/2018 Right CMC narrowing was noted.  All PIP and DIP narrowing was noted.  No MCP, metacarpocarpal, intercarpal, radiocarpal joint space narrowing was noted.  No erosive changes were noted. Impression: These findings are consistent with osteoarthritis of the hand.   Speciality Comments: No specialty comments available.    Procedures:  No procedures performed Allergies: Codeine and Morphine and related   Assessment / Plan:     Visit Diagnoses: Seronegative rheumatoid arthritis ([place]) - RF-,CCP-, 14-3-3 eta negative, Uric acid 4.8, ANA negative no erosive changes on XR of handssynovitis of bilateral wrists..  Patient is doing better on prednisone but she still continues to have some synovitis.  She was accompanied by her daughter today.  We had detailed discussion regarding different treatment options and their side effects.  After reviewing indications side effects contraindications decided to proceed  with Arava.  I handout was given and consent was obtained.  The plan is to start her on Arava 10 mg p.o. daily for 2 weeks if her labs are stable we will increase it to 20 mg p.o. daily.  She will have labs every 2 weeks x 2 and then every 2 months.  She is been also having elevated blood sugar on prednisone.  Have advised her to reduce prednisone to 7.5 mg for a week and then she will taper by 2.5 mg every week until she gets off the prednisone.  If she has any side effects from [place] she is supposed to notify us.  We are avoiding methotrexate due to elevated creatinine.  High risk medication use - Creatinine 1.18, GFR 45, G6PD 15.7, HIV -, Hep C -, Hep B -, IgM 26, TB gold negative, WBC 11.0   DDD (degenerative disc disease), cervical- she has chronic pain and discomfort in her cervical spine.  She states increased discomfort in her cervical spine which could be due to the inflammatory arthritis.  Chronic pain of both shoulders-she continues to have discomfort in her bilateral shoulders with difficulty raising her shoulders.  Osteopenia of multiple sites - She is on a calcium and vitamin D supplement.   Vitamin D deficiency  Hypertension associated with diabetes ([place])  Hyperlipidemia associated with type 2 diabetes mellitus (HCC)-she is on  insulin and has been closely followed up by her PCP.  History of gastroesophageal reflux (GERD)  History of peptic ulcer disease  Senile purpura ([place])  History of hypothyroidism  History of chronic pancreatitis  Former smoker - quit in 2017, 1/2 ppd for 50 yr    Orders: No orders of the defined types were placed in this encounter.  Meds ordered this encounter  Medications  . predniSONE (DELTASONE) 5 MG tablet    Sig: Take 1.5 tablets by mouth daily for 1 week, then 1 tablet by mouth daily for 1 week, then 1/2 tablet by mouth daily for 1 week.    Dispense:  21 tablet    Refill:  0  . leflunomide (ARAVA) 10 MG tablet    Sig: Take 1 tablet by  mouth daily for 2 weeks, if labs are stable, increase to 2 tablet by mouth daily.    Dispense:  42 tablet    Refill:  0    Face-to-face time spent with patient was 35 minutes. Greater than 50% of time was spent in counseling and coordination of care.  Follow-Up Instructions: Return in about 1 month (around 04/01/2018) for Rheumatoid arthritis.   [name], MD  Note - This record has been created using Editor, commissioning.  Chart creation errors have been sought, but may not always  have been located. Such creation errors do not reflect on  the standard of medical care.

## 2018-02-25 ENCOUNTER — Telehealth: Payer: Self-pay | Admitting: Rheumatology

## 2018-02-25 NOTE — Telephone Encounter (Signed)
Patient advised new patient labs will be discussed at new patient follow up visit.

## 2018-02-25 NOTE — Telephone Encounter (Signed)
Patient called stating she had her labwork on 02/19/18 and is requesting the results.

## 2018-02-26 ENCOUNTER — Telehealth: Payer: Self-pay | Admitting: Family Medicine

## 2018-02-26 LAB — COMPLETE METABOLIC PANEL WITH GFR
AG RATIO: 1.3 (calc) (ref 1.0–2.5)
ALKALINE PHOSPHATASE (APISO): 112 U/L (ref 33–130)
ALT: 12 U/L (ref 6–29)
AST: 13 U/L (ref 10–35)
Albumin: 3.8 g/dL (ref 3.6–5.1)
BUN/Creatinine Ratio: 15 (calc) (ref 6–22)
BUN: 18 mg/dL (ref 7–25)
CO2: 24 mmol/L (ref 20–32)
CREATININE: 1.18 mg/dL — AB (ref 0.60–0.93)
Calcium: 9.4 mg/dL (ref 8.6–10.4)
Chloride: 105 mmol/L (ref 98–110)
GFR, Est African American: 53 mL/min/{1.73_m2} — ABNORMAL LOW (ref >=60)
GFR, Est Non African American: 45 mL/min/{1.73_m2} — ABNORMAL LOW (ref >=60)
GLOBULIN: 2.9 g/dL (ref 1.9–3.7)
Glucose, Bld: 86 mg/dL (ref 65–99)
POTASSIUM: 5.9 mmol/L — AB (ref 3.5–5.3)
SODIUM: 139 mmol/L (ref 135–146)
Total Bilirubin: 0.4 mg/dL (ref 0.2–1.2)
Total Protein: 6.7 g/dL (ref 6.1–8.1)

## 2018-02-26 LAB — ANA: Anti Nuclear Antibody(ANA): NEGATIVE

## 2018-02-26 LAB — CBC WITH DIFFERENTIAL/PLATELET
BASOS ABS: 33 {cells}/uL (ref 0–200)
Basophils Relative: 0.3 %
EOS ABS: 209 {cells}/uL (ref 15–500)
Eosinophils Relative: 1.9 %
HEMATOCRIT: 36.9 % (ref 35.0–45.0)
Hemoglobin: 12.4 g/dL (ref 11.7–15.5)
Lymphs Abs: 4015 cells/uL — ABNORMAL HIGH (ref 850–3900)
MCH: 30.9 pg (ref 27.0–33.0)
MCHC: 33.6 g/dL (ref 32.0–36.0)
MCV: 92 fL (ref 80.0–100.0)
MPV: 9.6 fL (ref 7.5–12.5)
Monocytes Relative: 7.5 %
NEUTROS PCT: 53.8 %
Neutro Abs: 5918 cells/uL (ref 1500–7800)
PLATELETS: 352 10*3/uL (ref 140–400)
RBC: 4.01 10*6/uL (ref 3.80–5.10)
RDW: 13 % (ref 11.0–15.0)
Total Lymphocyte: 36.5 %
WBC: 11 10*3/uL — AB (ref 3.8–10.8)
WBCMIX: 825 {cells}/uL (ref 200–950)

## 2018-02-26 LAB — PARVOVIRUS B19 ANTIBODY, IGG AND IGM
Parvovirus B19 IgG: 6.8 — ABNORMAL HIGH (ref <0.9)
Parvovirus B19 IgM: 0.2 (ref <0.9)

## 2018-02-26 LAB — PROTEIN ELECTROPHORESIS, SERUM, WITH REFLEX
ALBUMIN ELP: 3.5 g/dL — AB (ref 3.8–4.8)
ALPHA 1: 0.5 g/dL — AB (ref 0.2–0.3)
ALPHA 2: 1 g/dL — AB (ref 0.5–0.9)
Beta 2: 0.4 g/dL (ref 0.2–0.5)
Beta Globulin: 0.5 g/dL (ref 0.4–0.6)
Gamma Globulin: 1 g/dL (ref 0.8–1.7)
Total Protein: 6.9 g/dL (ref 6.1–8.1)

## 2018-02-26 LAB — QUANTIFERON-TB GOLD PLUS
Mitogen-NIL: 8.94 IU/mL
NIL: 0.09 IU/mL
QuantiFERON-TB Gold Plus: NEGATIVE
TB1-NIL: 0 [IU]/mL
TB2-NIL: 0 [IU]/mL

## 2018-02-26 LAB — THIOPURINE METHYLTRANSFERASE (TPMT), RBC: Thiopurine Methyltransferase, RBC: 17 nmol/hr/mL RBC

## 2018-02-26 LAB — HEPATITIS B CORE ANTIBODY, IGM: HEP B C IGM: NONREACTIVE

## 2018-02-26 LAB — HEPATITIS C ANTIBODY
Hepatitis C Ab: NONREACTIVE
SIGNAL TO CUT-OFF: 0.01 (ref <1.00)

## 2018-02-26 LAB — 14-3-3 ETA PROTEIN: 14-3-3 eta Protein: <0.2 ng/mL (ref <0.2)

## 2018-02-26 LAB — URINALYSIS, ROUTINE W REFLEX MICROSCOPIC
Bilirubin Urine: NEGATIVE
Glucose, UA: NEGATIVE
Hgb urine dipstick: NEGATIVE
Ketones, ur: NEGATIVE
LEUKOCYTES UA: NEGATIVE
NITRITE: NEGATIVE
Protein, ur: NEGATIVE
SPECIFIC GRAVITY, URINE: 1.016 (ref 1.001–1.03)

## 2018-02-26 LAB — IFE INTERPRETATION

## 2018-02-26 LAB — GLUCOSE 6 PHOSPHATE DEHYDROGENASE: G-6PDH: 15.7 U/g Hgb (ref 7.0–20.5)

## 2018-02-26 LAB — HIV ANTIBODY (ROUTINE TESTING W REFLEX): HIV: NONREACTIVE

## 2018-02-26 LAB — B. BURGDORFI ANTIBODIES

## 2018-02-26 LAB — IGG, IGA, IGM
IgG (Immunoglobin G), Serum: 1083 mg/dL (ref 694–1618)
IgM, Serum: 26 mg/dL — ABNORMAL LOW (ref 48–271)
Immunoglobulin A: 339 mg/dL (ref 81–463)

## 2018-02-26 LAB — URIC ACID: Uric Acid, Serum: 4.8 mg/dL (ref 2.5–7.0)

## 2018-02-26 LAB — HEPATITIS B SURFACE ANTIGEN: HEP B S AG: NONREACTIVE

## 2018-02-26 NOTE — Telephone Encounter (Signed)
Have her check her blood sugars 2 hours after meal rather than 1-1/2 and have her schedule to come in either Monday or Tuesday.  Have her bring in all of her blood sugar readings.

## 2018-02-26 NOTE — Telephone Encounter (Signed)
Will be in tuesday

## 2018-02-26 NOTE — Telephone Encounter (Signed)
  Patient called about blood sugars She states that over the last 6 days her sugars have been ranging from 143-274 She takes reading 1 1/2 hours after she eats  Please advise

## 2018-02-28 ENCOUNTER — Telehealth: Payer: Self-pay | Admitting: *Deleted

## 2018-02-28 DIAGNOSIS — R899 Unspecified abnormal finding in specimens from other organs, systems and tissues: Secondary | ICD-10-CM

## 2018-02-28 NOTE — Telephone Encounter (Signed)
-----   Message from Ofilia Neas, PA-C sent at 02/28/2018  8:13 AM EDT ----- Labs will be discussed at new patient follow up on 03/04/18.  Please refer patient to hematology due to findings of IFE results.

## 2018-03-03 ENCOUNTER — Telehealth: Payer: Self-pay | Admitting: Hematology

## 2018-03-03 ENCOUNTER — Encounter: Payer: Self-pay | Admitting: Hematology

## 2018-03-03 NOTE — Telephone Encounter (Signed)
New referral received from Dr. Estanislado Pandy for a hematology appointment. Pt has been scheduled to see Dr. Irene Limbo on 7/25 at 1pm. Letter mailed.

## 2018-03-04 ENCOUNTER — Ambulatory Visit: Payer: Medicare HMO | Admitting: Rheumatology

## 2018-03-04 ENCOUNTER — Ambulatory Visit (INDEPENDENT_AMBULATORY_CARE_PROVIDER_SITE_OTHER): Payer: Medicare HMO | Admitting: Family Medicine

## 2018-03-04 ENCOUNTER — Encounter: Payer: Self-pay | Admitting: Rheumatology

## 2018-03-04 ENCOUNTER — Telehealth: Payer: Self-pay | Admitting: Rheumatology

## 2018-03-04 ENCOUNTER — Encounter: Payer: Self-pay | Admitting: Family Medicine

## 2018-03-04 VITALS — BP 118/70 | HR 77 | Temp 98.1°F | Wt 110.6 lb

## 2018-03-04 VITALS — BP 106/66 | HR 74 | Resp 12 | Ht 62.75 in | Wt 112.0 lb

## 2018-03-04 DIAGNOSIS — E1159 Type 2 diabetes mellitus with other circulatory complications: Secondary | ICD-10-CM | POA: Diagnosis not present

## 2018-03-04 DIAGNOSIS — M0600 Rheumatoid arthritis without rheumatoid factor, unspecified site: Secondary | ICD-10-CM

## 2018-03-04 DIAGNOSIS — E559 Vitamin D deficiency, unspecified: Secondary | ICD-10-CM

## 2018-03-04 DIAGNOSIS — I152 Hypertension secondary to endocrine disorders: Secondary | ICD-10-CM

## 2018-03-04 DIAGNOSIS — Z8711 Personal history of peptic ulcer disease: Secondary | ICD-10-CM | POA: Diagnosis not present

## 2018-03-04 DIAGNOSIS — E118 Type 2 diabetes mellitus with unspecified complications: Secondary | ICD-10-CM | POA: Diagnosis not present

## 2018-03-04 DIAGNOSIS — Z8719 Personal history of other diseases of the digestive system: Secondary | ICD-10-CM | POA: Diagnosis not present

## 2018-03-04 DIAGNOSIS — Z8639 Personal history of other endocrine, nutritional and metabolic disease: Secondary | ICD-10-CM | POA: Diagnosis not present

## 2018-03-04 DIAGNOSIS — M25512 Pain in left shoulder: Secondary | ICD-10-CM

## 2018-03-04 DIAGNOSIS — Z87891 Personal history of nicotine dependence: Secondary | ICD-10-CM

## 2018-03-04 DIAGNOSIS — M25511 Pain in right shoulder: Secondary | ICD-10-CM | POA: Diagnosis not present

## 2018-03-04 DIAGNOSIS — M8589 Other specified disorders of bone density and structure, multiple sites: Secondary | ICD-10-CM

## 2018-03-04 DIAGNOSIS — M5030 Other cervical disc degeneration, unspecified cervical region: Secondary | ICD-10-CM | POA: Diagnosis not present

## 2018-03-04 DIAGNOSIS — Z79899 Other long term (current) drug therapy: Secondary | ICD-10-CM

## 2018-03-04 DIAGNOSIS — D692 Other nonthrombocytopenic purpura: Secondary | ICD-10-CM

## 2018-03-04 DIAGNOSIS — E1169 Type 2 diabetes mellitus with other specified complication: Secondary | ICD-10-CM | POA: Diagnosis not present

## 2018-03-04 DIAGNOSIS — I10 Essential (primary) hypertension: Secondary | ICD-10-CM

## 2018-03-04 DIAGNOSIS — G8929 Other chronic pain: Secondary | ICD-10-CM

## 2018-03-04 DIAGNOSIS — E785 Hyperlipidemia, unspecified: Secondary | ICD-10-CM

## 2018-03-04 DIAGNOSIS — E119 Type 2 diabetes mellitus without complications: Secondary | ICD-10-CM | POA: Diagnosis not present

## 2018-03-04 MED ORDER — PREDNISONE 5 MG PO TABS: ORAL_TABLET | ORAL | 0 refills | Status: DC

## 2018-03-04 MED ORDER — LEFLUNOMIDE 10 MG PO TABS: ORAL_TABLET | ORAL | 0 refills | Status: DC

## 2018-03-04 NOTE — Telephone Encounter (Signed)
Patient called asking that the RX for her [facility] [address] [facility] in pharmacy.  CB#[phone].  Thank you.

## 2018-03-04 NOTE — Patient Instructions (Signed)
Eat  regular meals and if there is any question about your blood sugar check it but keep a record of it in regard to when in relation to eating .  Hopefully what will see is your sugar numbers coming down as you come down off of the steroid If towards the end of taking your steroids you see your blood sugars dropping below 100 call me

## 2018-03-04 NOTE — Patient Instructions (Signed)
Prednisone taper: 7.5mg  x1 week  5 mg x1 week 2.5 mg x1 week   Arava: 10mg  daily x2 weeks, if labs are stable we will increase to 20mg  by mouth daily.   Standing Labs We placed an order today for your standing lab work.    Please come back and get your standing labs in 2 weeks x2 and then every 2 months.   We have open lab Monday through Friday from 8:30-11:30 AM and 1:30-4:00 PM  at the office of Dr. [name].   You may experience shorter wait times on Monday and Friday afternoons. The office is located at [address] [place], [place], [place] [address] No appointment is necessary.   Labs are drawn by [facility].  You may receive a bill from [facility] for your lab work. If you have any questions regarding directions or hours of operation,  please call [phone].     Leflunomide tablets What is this medicine? LEFLUNOMIDE (le FLOO na mide) is for rheumatoid arthritis. This medicine may be used for other purposes; ask your health care provider or pharmacist if you have questions. COMMON BRAND NAME(S): Arava What should I tell my health care provider before I take this medicine? They need to know if you have any of these conditions: -alcoholism -bone marrow problems -fever or infection -immune system problems -kidney disease -liver disease -an unusual or allergic reaction to leflunomide, teriflunomide, other medicines, lactose, foods, dyes, or preservatives -pregnant or trying to get pregnant -breast-feeding How should I use this medicine? Take this medicine by mouth with a full glass of water. Follow the directions on the prescription label. Take your medicine at regular intervals. Do not take your medicine more often than directed. Do not stop taking except on your doctor's advice. Talk to your pediatrician regarding the use of this medicine in children. Special care may be needed. Overdosage: If you think you have taken too much of this medicine contact a poison  control center or emergency room at once. NOTE: This medicine is only for you. Do not share this medicine with others. What if I miss a dose? If you miss a dose, take it as soon as you can. If it is almost time for your next dose, take only that dose. Do not take double or extra doses. What may interact with this medicine? Do not take this medicine with any of the following medications: -teriflunomide This medicine may also interact with the following medications: -charcoal -cholestyramine -methotrexate -NSAIDs, medicines for pain and inflammation, like ibuprofen or naproxen -phenytoin -rifampin -tolbutamide -vaccines -warfarin This list may not describe all possible interactions. Give your health care provider a list of all the medicines, herbs, non-prescription drugs, or dietary supplements you use. Also tell them if you smoke, drink alcohol, or use illegal drugs. Some items may interact with your medicine. What should I watch for while using this medicine? Visit your doctor or health care professional for regular checks on your progress. You will need frequent blood checks while you are receiving the medicine. If you get a cold or other infection while receiving this medicine, call your doctor or health care professional. Do not treat yourself. The medicine may increase your risk of getting an infection. If you are a woman who has the potential to become pregnant, discuss birth control options with your doctor or health care professional. Dennis Bast must not be pregnant, and you must be using a reliable form of birth control. The medicine may harm an unborn baby.  Immediately call your doctor if you think you might be pregnant. Alcoholic drinks may increase possible damage to your liver. Do not drink alcohol while taking this medicine. What side effects may I notice from receiving this medicine? Side effects that you should report to your doctor or health care professional as soon as  possible: -allergic reactions like skin rash, itching or hives, swelling of the face, lips, or tongue -cough -difficulty breathing or shortness of breath -fever, chills or any other sign of infection -redness, blistering, peeling or loosening of the skin, including inside the mouth -unusual bleeding or bruising -unusually weak or tired -vomiting -yellowing of eyes or skin Side effects that usually do not require medical attention (report to your doctor or health care professional if they continue or are bothersome): -diarrhea -hair loss -headache -nausea This list may not describe all possible side effects. Call your doctor for medical advice about side effects. You may report side effects to FDA at 1-800-FDA-1088. Where should I keep my medicine? Keep out of the reach of children. Store at room temperature between 15 and 30 degrees C (59 and 86 degrees F). Protect from moisture and light. Throw away any unused medicine after the expiration date. NOTE: This sheet is a summary. It may not cover all possible information. If you have questions about this medicine, talk to your doctor, pharmacist, or health care provider.  2018 Elsevier/Gold Standard (2013-08-11 10:53:11)

## 2018-03-04 NOTE — Telephone Encounter (Signed)
Re-sent prescription to local pharmacy.

## 2018-03-04 NOTE — Progress Notes (Signed)
   Subjective:    Patient ID: Deborah Travis, female    DOB: 05-13-1941, 77 y.o.   MRN: [id number]  HPI She is here for a recheck.  She has seronegative rheumatoid arthritis.  She was seen yesterday by her rheumatologist.  Presently she is on 10 mg of prednisone and will go down to 7-1/2 tomorrow.  She will be slowly tapered off of this.  She did bring her blood sugar readings in and for the most part they are reasonable except for the last 2 days.  She did have one episode of hypoglycemia however her meal prior to that was less than normal.   Review of Systems     Objective:   Physical Exam Alert and in no distress.  Forearms do show purpuric lesions. Her blood sugar readings were reviewed.      Assessment & Plan:  Senile purpura ([place])  Type 2 diabetes mellitus with complication, without long-term current use of insulin (HCC)  Seronegative rheumatoid arthritis ([place]) She will continue to monitor her blood sugars.  Discussed checking them in the morning.  If she has any episodes she is concerned about hypoglycemia she is to check her blood sugar.  Hopefully as she decreases her prednisone her blood sugars will come under better control and we can possibly get her off the insulin entirely.  She will recheck with me in 1 month.

## 2018-03-06 ENCOUNTER — Telehealth: Payer: Self-pay | Admitting: Rheumatology

## 2018-03-06 NOTE — Telephone Encounter (Signed)
Patient states she had a sore neck all day yesterday and then when she said when got ready to go to bed the pain was so bad it was going down her shoulder and into her arms and into her hands. Patient states her hands were swollen and unable to pick anything up. Patient states this morning the pain gradually went away. Patient states she is sore. Patient advised that she is to start the [place] while she is on the prednisone. Patient advised that she has what is called Seronegative Rheumatoid Negative Rheumatoid Arthritis. Patient was asking for a PET Scan because she hurts all over. Patient advised that it is due to the RA and we will reassess after she has given the [place] time to get in her system.

## 2018-03-06 NOTE — Telephone Encounter (Signed)
prPatient called stating she has a couple of questions.  1.  Patient wants to know if she should start taking the Leflunomide now or wait until she has weaned off the Prednisone.  2.  Patient is asking if her bloodwork came back negative then does she have Rheumatoid Arthritis?  3.  Patient would like to get a PET scan.

## 2018-03-10 ENCOUNTER — Telehealth: Payer: Self-pay | Admitting: Rheumatology

## 2018-03-10 MED ORDER — METHOCARBAMOL 500 MG PO TABS: 500.0000 mg | ORAL_TABLET | Freq: Every evening | ORAL | 0 refills | Status: DC | PRN

## 2018-03-10 NOTE — Telephone Encounter (Signed)
Robaxin 500 mg p.o. nightly as needed total 30 with no refills

## 2018-03-10 NOTE — Telephone Encounter (Signed)
Patient called stating she remembered the name of the medicine that she would like Dr. Estanislado Pandy to prescribe for her "episodes of pain in her neck" which is Robaxin.  [facility].

## 2018-03-10 NOTE — Telephone Encounter (Signed)
Last Visit: 03/04/18 Next Visit: 04/14/18  Patient called stating she remembered the name of the medicine that she would like Dr. Estanislado Pandy to prescribe for her "episodes of pain in her neck" which is Robaxin.  Patient is requesting it be sent to [facility].    Okay to call in prescription for Robaxin?

## 2018-03-18 ENCOUNTER — Other Ambulatory Visit: Payer: Self-pay | Admitting: *Deleted

## 2018-03-18 DIAGNOSIS — Z79899 Other long term (current) drug therapy: Secondary | ICD-10-CM

## 2018-03-18 LAB — CBC WITH DIFFERENTIAL/PLATELET
BASOS PCT: 0.2 %
Basophils Absolute: 21 cells/uL (ref 0–200)
EOS ABS: 31 {cells}/uL (ref 15–500)
Eosinophils Relative: 0.3 %
HEMATOCRIT: 36.3 % (ref 35.0–45.0)
Hemoglobin: 12.2 g/dL (ref 11.7–15.5)
Lymphs Abs: 2328 cells/uL (ref 850–3900)
MCH: 31.1 pg (ref 27.0–33.0)
MCHC: 33.6 g/dL (ref 32.0–36.0)
MCV: 92.6 fL (ref 80.0–100.0)
MPV: 9.7 fL (ref 7.5–12.5)
Monocytes Relative: 5.1 %
Neutro Abs: 7395 cells/uL (ref 1500–7800)
Neutrophils Relative %: 71.8 %
PLATELETS: 327 10*3/uL (ref 140–400)
RBC: 3.92 10*6/uL (ref 3.80–5.10)
RDW: 12.9 % (ref 11.0–15.0)
TOTAL LYMPHOCYTE: 22.6 %
WBC: 10.3 10*3/uL (ref 3.8–10.8)
WBCMIX: 525 {cells}/uL (ref 200–950)

## 2018-03-18 LAB — COMPLETE METABOLIC PANEL WITH GFR
AG Ratio: 1.3 (calc) (ref 1.0–2.5)
ALT: 25 U/L (ref 6–29)
AST: 27 U/L (ref 10–35)
Albumin: 3.7 g/dL (ref 3.6–5.1)
Alkaline phosphatase (APISO): 86 U/L (ref 33–130)
BUN: 15 mg/dL (ref 7–25)
CALCIUM: 8.9 mg/dL (ref 8.6–10.4)
CO2: 24 mmol/L (ref 20–32)
CREATININE: 0.89 mg/dL (ref 0.60–0.93)
Chloride: 103 mmol/L (ref 98–110)
GFR, EST NON AFRICAN AMERICAN: 64 mL/min/{1.73_m2} (ref >=60)
GFR, Est African American: 74 mL/min/{1.73_m2} (ref >=60)
GLOBULIN: 2.9 g/dL (ref 1.9–3.7)
Glucose, Bld: 119 mg/dL — ABNORMAL HIGH (ref 65–99)
Potassium: 4.9 mmol/L (ref 3.5–5.3)
SODIUM: 134 mmol/L — AB (ref 135–146)
Total Bilirubin: 0.2 mg/dL (ref 0.2–1.2)
Total Protein: 6.6 g/dL (ref 6.1–8.1)

## 2018-03-19 ENCOUNTER — Telehealth: Payer: Self-pay | Admitting: Family Medicine

## 2018-03-19 ENCOUNTER — Ambulatory Visit: Payer: Medicare HMO | Admitting: Rheumatology

## 2018-03-19 NOTE — Progress Notes (Signed)
HEMATOLOGY/ONCOLOGY CONSULTATION NOTE  Date of Service: 03/20/2018    Patient Care Team: Deborah Lung, MD as PCP - General (Family Medicine)  CHIEF COMPLAINTS/PURPOSE OF CONSULTATION:  Abnormal IFE   HISTORY OF PRESENTING ILLNESS:   Deborah Travis is a wonderful 76 y.o. female who has been referred to [place] by Deborah Travis for evaluation and management of Abnormal IFE. She is accompanied today by her daughter. The pt reports that she is doing well overall.   The pt reports that she had a stiff neck a couple months ago, and began noticing worsening joint pain in her knees, shoulders, wrists and fingers in the last few weeks.  She notes that her joints swelled and that her morning stiffness lasts 2-3 hours. She was referred to rheumatologist t Dr. Bo Travis in the last month who diagnosed her with seronegative rheumatoid arthritis. She has been taking a Prednisone taper and started 28m Arava recently, and notes that she is just beginning to experience some relief from this joint pain.   Most recent lab results (02/19/18) of IgG, IgA, IgM is as follows: all values are WNL except for IgM at 26. CBC w/diff on 03/18/18 showed all values WNL.   On review of systems, pt reports stiff/swollen joints that are improving, resolved neck pain, good energy levels and denies abdominal pains, fevers, chills, and any other symptoms.   On PMHx the pt reports Seronegative rheumatoid arthritis taking prednisone and Arava.    MEDICAL HISTORY:  Past Medical History:  Diagnosis Date  . Chronic kidney disease    RENAL STONE  . Chronic pancreatitis ([place]   . Diabetes mellitus   . Duodenal ulcer   . Dyslipidemia   . GERD (gastroesophageal reflux disease)   . Hypertension   . Osteopenia   . Thyroid disease    HYPOTHYROID  . Vitamin D deficiency     SURGICAL HISTORY: Past Surgical History:  Procedure Laterality Date  . APPENDECTOMY    . CATARACT EXTRACTION Right 2014  . CHOLECYSTECTOMY     . TONSILECTOMY, ADENOIDECTOMY, BILATERAL MYRINGOTOMY AND TUBES    . WHIPPLE PROCEDURE  1998    SOCIAL HISTORY: Social History   Socioeconomic History  . Marital status: Single    Spouse name: Not on file  . Number of children: Not on file  . Years of education: Not on file  . Highest education level: Not on file  Occupational History  . Not on file  Social Needs  . Financial resource strain: Not on file  . Food insecurity:    Worry: Not on file    Inability: Not on file  . Transportation needs:    Medical: Not on file    Non-medical: Not on file  Tobacco Use  . Smoking status: Former Smoker    Packs/day: 0.10    Last attempt to quit: 10/31/2015    Years since quitting: 2.3  . Smokeless tobacco: Never Used  . Tobacco comment: 3 cigarettes a day  Substance and Sexual Activity  . Alcohol use: No    Alcohol/week: 0.0 oz  . Drug use: No  . Sexual activity: Not Currently  Lifestyle  . Physical activity:    Days per week: Not on file    Minutes per session: Not on file  . Stress: Not on file  Relationships  . Social connections:    Talks on phone: Not on file    Gets together: Not on file    Attends religious service:  Not on file    Active member of club or organization: Not on file    Attends meetings of clubs or organizations: Not on file    Relationship status: Not on file  . Intimate partner violence:    Fear of current or ex partner: Not on file    Emotionally abused: Not on file    Physically abused: Not on file    Forced sexual activity: Not on file  Other Topics Concern  . Not on file  Social History Narrative  . Not on file    FAMILY HISTORY: Family History  Problem Relation Age of Onset  . Stroke Mother   . Heart disease Father   . Healthy Daughter   . Healthy Daughter     ALLERGIES:  is allergic to codeine and morphine and related.  MEDICATIONS:  Current Outpatient Medications  Medication Sig Dispense Refill  . ACCU-CHEK FASTCLIX LANCETS  MISC 1 Device by Does not apply route 2 (two) times daily. 15 each 3  . Alcohol Swabs (B-D SINGLE USE SWABS BUTTERFLY) PADS 1 each by Does not apply route 2 (two) times daily. 300 each 3  . amoxicillin (AMOXIL) 875 MG tablet Take 1 tablet (875 mg total) by mouth 2 (two) times daily. (Patient not taking: Reported on 12/24/2017) 20 tablet 0  . b complex vitamins capsule Take 1 capsule by mouth daily.    . Blood Glucose Calibration (ACCU-CHEK AVIVA) SOLN Use as directed 3 each 0  . Blood Glucose Monitoring Suppl (ACCU-CHEK AVIVA PLUS) w/Device KIT Use as directed Patient is to test BID DX E11.9 1 kit 0  . Calcium Citrate-Vitamin D 1000-400 LIQD     . CALCIUM PO Take by mouth daily.    . cyclobenzaprine (FLEXERIL) 5 MG tablet Take 1 tablet (5 mg total) by mouth 3 (three) times daily as needed for muscle spasms. (Patient not taking: Reported on 02/11/2018) 20 tablet 1  . glucose blood (ACCU-CHEK GUIDE) test strip 100 each by Other route as needed for other. Use as instructed    . glucose blood test strip THIS IS FOR Accucheck Aviva Plus Use as instructed PATIENT IS TO TEST ONE TIME A DAY DX: E11.9 100 each 3  . insulin glargine (LANTUS) 100 UNIT/ML injection Inject 0.1 mLs (10 Units total) into the skin at bedtime. 15 mL 1  . leflunomide (ARAVA) 10 MG tablet Take 1 tablet by mouth daily for 2 weeks, if labs are stable, increase to 2 tablet by mouth daily. (Patient taking differently: 20 mg. Take 1 tablet by mouth daily for 2 weeks, if labs are stable, increase to 2 tablet by mouth daily.) 42 tablet 0  . levothyroxine (SYNTHROID, LEVOTHROID) 100 MCG tablet Take 1 tablet (100 mcg total) by mouth daily. 90 tablet 3  . lisinopril (PRINIVIL,ZESTRIL) 10 MG tablet Take 1 tablet (10 mg total) by mouth daily. 90 tablet 3  . meclizine (ANTIVERT) 25 MG tablet Take 1 tablet (25 mg total) by mouth 2 (two) times daily. (Patient taking differently: Take 25 mg by mouth as needed. ) 30 tablet 0  . metFORMIN (GLUCOPHAGE)  1000 MG tablet One twice a day 180 tablet 1  . methocarbamol (ROBAXIN) 500 MG tablet Take 1 tablet (500 mg total) by mouth at bedtime as needed for muscle spasms. 30 tablet 0  . Multiple Vitamin (MULITIVITAMIN WITH MINERALS) TABS Take 1 tablet by mouth daily.    . pravastatin (PRAVACHOL) 20 MG tablet Take 1 tablet (20 mg total) by  mouth daily. 90 tablet 3  . predniSONE (DELTASONE) 5 MG tablet Take 1.5 tablets by mouth daily for 1 week, then 1 tablet by mouth daily for 1 week, then 1/2 tablet by mouth daily for 1 week. 21 tablet 0  . traMADol (ULTRAM) 50 MG tablet Take 1 tablet (50 mg total) by mouth every 8 (eight) hours as needed. (Patient not taking: Reported on 03/04/2018) 20 tablet 0   No current facility-administered medications for this visit.     REVIEW OF SYSTEMS:    10 Point review of Systems was done is negative except as noted above.  PHYSICAL EXAMINATION:  . Vitals:   03/20/18 1328  BP: 107/71  Pulse: 76  Resp: 18  Temp: 98.2 F (36.8 C)  SpO2: 100%   Filed Weights   03/20/18 1328  Weight: 110 lb 14.4 oz (50.3 kg)   .Body mass index is 19.8 kg/m.  GENERAL:alert, in no acute distress and comfortable SKIN: no acute rashes, no significant lesions EYES: conjunctiva are pink and non-injected, sclera anicteric OROPHARYNX: MMM, no exudates, no oropharyngeal erythema or ulceration NECK: supple, no JVD LYMPH:  no palpable lymphadenopathy in the cervical, axillary or inguinal regions LUNGS: clear to auscultation b/l with normal respiratory effort HEART: regular rate & rhythm ABDOMEN:  normoactive bowel sounds , non tender, not distended. Extremity: no pedal edema PSYCH: alert & oriented x 3 with fluent speech NEURO: no focal motor/sensory deficits  LABORATORY DATA:  I have reviewed the data as listed  . CBC Latest Ref Rng & Units 03/18/2018 02/19/2018 12/24/2017  WBC 3.8 - 10.8 Thousand/uL 10.3 11.0(H) 7.8  Hemoglobin 11.7 - 15.5 g/dL 12.2 12.4 12.5  Hematocrit 35.0  - 45.0 % 36.3 36.9 39.0  Platelets 140 - 400 Thousand/uL 327 352 288    . CMP Latest Ref Rng & Units 03/18/2018 02/19/2018 02/19/2018  Glucose 65 - 99 mg/dL 119(H) - 86  BUN 7 - 25 mg/dL 15 - 18  Creatinine 0.60 - 0.93 mg/dL 0.89 - 1.18(H)  Sodium 135 - 146 mmol/L 134(L) - 139  Potassium 3.5 - 5.3 mmol/L 4.9 - 5.9(H)  Chloride 98 - 110 mmol/L 103 - 105  CO2 20 - 32 mmol/L 24 - 24  Calcium 8.6 - 10.4 mg/dL 8.9 - 9.4  Total Protein 6.1 - 8.1 g/dL 6.6 6.9 6.7  Total Bilirubin 0.2 - 1.2 mg/dL 0.2 - 0.4  Alkaline Phos 39 - 117 IU/L - - -  AST 10 - 35 U/L 27 - 13  ALT 6 - 29 U/L 25 - 12     Component     Latest Ref Rng & Units 02/19/2018  Hepatitis C Ab     NON-REACTI NON-REACTIVE  SIGNAL TO CUT-OFF     <1.00 0.01  Hep B Core Ab, IgM     NON-REACTI NON-REACTIVE  Hepatitis B Surface Ag     NON-REACTI NON-REACTIVE  HIV     NON-REACTI NON-REACTIVE       RADIOGRAPHIC STUDIES: I have personally reviewed the radiological images as listed and agreed with the findings in the report. Xr Cervical Spine 2 Or 3 Views  Result Date: 02/19/2018 Multilevel spondylosis was noted.  C4-5, C5-6 narrowing was noted.  Facet joint arthropathy was noted. Impression: These findings are consistent with DDD of cervical spine with C4-5 and C5-6 significant narrowing.  Xr Hand 2 View Left  Result Date: 02/19/2018 CMC, PIP and DIP narrowing was noted.  Mild juxta-articular osteopenia was noted.  No significant MCP narrowing,  intercarpal or radiocarpal joint space narrowing was noted.  Possible cystic versus erosive changes noted around the carpal bones. Impression: These findings consistent with osteoarthritis and inflammatory arthritis.  Xr Hand 2 View Right  Result Date: 02/19/2018 Right CMC narrowing was noted.  All PIP and DIP narrowing was noted.  No MCP, metacarpocarpal, intercarpal, radiocarpal joint space narrowing was noted.  No erosive changes were noted. Impression: These findings are  consistent with osteoarthritis of the hand.   ASSESSMENT & PLAN:  77 y.o. female with  1. Abnormal IFE with abnormal band in gammaglobin fraction -- likely related to significant inflammation from recently  Diagnosed seronegative RA PLAN - -Discussed patient's most recent labs from 02/19/18, IgM at 26. CBC w/diff showed all values WNL except for HGB at 12.2, PLT at 327k.  -Discussed that globulins can become elevated in response to inflammatory responses like her RA -02/19/18 SPEP showed a faint abnormal protein -Will see pt back in 4 months, after her newly diagnosed RA receives additional treatment to rpt labs and ensure she has no progressive monoclonal paraproteinemia -continue f/u with rheumatology to optimize rx for RA   RTC with Dr Deborah Travis in 4 months Labs 1 week prior to clinic visit   All of the patients questions were answered with apparent satisfaction. The patient knows to call the clinic with any problems, questions or concerns.  The total time spent in the appt was 30 minutes and more than 50% was on counseling and direct patient cares.    Sullivan Lone MD MS AAHIVMS [facility] Hematology/Oncology Physician [facility]  (Office):       [phone] (Work cell):  [phone] (Fax):           [phone]  03/20/2018 2:00 PM  I, Deborah Travis, am acting as a Education administrator for Dr Deborah Travis.   .I have reviewed the above documentation for accuracy and completeness, and I agree with the above. Deborah Genera MD

## 2018-03-19 NOTE — Telephone Encounter (Signed)
Pt has called back and stated that [facility] is not going to cover her strips and lancets. It will cost her $177 and she stated she can't afford it. Please advise.

## 2018-03-19 NOTE — Telephone Encounter (Signed)
Pt was advised that she should just pick the org script and not test six times a day. Pt was no aware that she is only testing one time a day and has ran out of strips. Pt will call pharmacy to get a refill and if they said it is to early to have them to call the office. [place]

## 2018-03-19 NOTE — Telephone Encounter (Signed)
Pt requesting that instructions be changed on test strips and lancets to reflect that she tests blood sugars 6 times per day before she can get refills today. Pt says she had been testing that frequent because Dr Redmond School asked her to keep a record of blood sugar readings.

## 2018-03-19 NOTE — Telephone Encounter (Signed)
See if you can help her out with this and make sure that she has an appointment in a couple weeks

## 2018-03-20 ENCOUNTER — Encounter: Payer: Self-pay | Admitting: Hematology

## 2018-03-20 ENCOUNTER — Inpatient Hospital Stay: Payer: Medicare HMO | Attending: Hematology | Admitting: Hematology

## 2018-03-20 ENCOUNTER — Telehealth: Payer: Self-pay | Admitting: Hematology

## 2018-03-20 ENCOUNTER — Telehealth: Payer: Self-pay | Admitting: Rheumatology

## 2018-03-20 VITALS — BP 107/71 | HR 76 | Temp 98.2°F | Resp 18 | Ht 62.75 in | Wt 110.9 lb

## 2018-03-20 DIAGNOSIS — Z87891 Personal history of nicotine dependence: Secondary | ICD-10-CM | POA: Diagnosis not present

## 2018-03-20 DIAGNOSIS — M0600 Rheumatoid arthritis without rheumatoid factor, unspecified site: Secondary | ICD-10-CM | POA: Diagnosis not present

## 2018-03-20 DIAGNOSIS — R771 Abnormality of globulin: Secondary | ICD-10-CM

## 2018-03-20 DIAGNOSIS — D472 Monoclonal gammopathy: Secondary | ICD-10-CM

## 2018-03-20 NOTE — Telephone Encounter (Signed)
Pt has an appt 04-04-18 and advise you of the [facility] meter it will not cost pt more than 20.00 for meter and supplies. [place]

## 2018-03-20 NOTE — Telephone Encounter (Signed)
Gave ppt avs and calendar with  appts per 7/25 los.

## 2018-03-20 NOTE — Telephone Encounter (Signed)
Patient called requesting the results of her labwork that she had done yesterday.

## 2018-03-20 NOTE — Telephone Encounter (Signed)
Patient advised of lab results and verbalized understanding.  

## 2018-03-31 NOTE — Progress Notes (Deleted)
Office Visit Note  Patient: Deborah Travis             Date of Birth: November 06, 1940           MRN: [id number]             PCP: [name], MD Referring: [name], MD Visit Date: 04/14/2018 Occupation: @GUAROCC @  Subjective:  No chief complaint on file.   History of Present Illness: Deborah Travis is a 77 y.o. female ***   Activities of Daily Living:  Patient reports morning stiffness for *** {minute/hour:19697}.   Patient {ACTIONS;DENIES/REPORTS:21021675::"Denies"} nocturnal pain.  Difficulty dressing/grooming: {ACTIONS;DENIES/REPORTS:21021675::"Denies"} Difficulty climbing stairs: {ACTIONS;DENIES/REPORTS:21021675::"Denies"} Difficulty getting out of chair: {ACTIONS;DENIES/REPORTS:21021675::"Denies"} Difficulty using hands for taps, buttons, cutlery, and/or writing: {ACTIONS;DENIES/REPORTS:21021675::"Denies"}  No Rheumatology ROS completed.   PMFS History:  Patient Active Problem List   Diagnosis Date Noted  . Seronegative rheumatoid arthritis ([place]) 03/04/2018  . Senile purpura ([place]) 02/01/2015  . Former smoker 10/12/2013  . History of peptic ulcer disease 07/13/2013  . Osteopenia 05/07/2013  . Hypothyroidism 01/12/2012  . Type 2 diabetes with complication ([place]) 48/54/6270  . Hypertension associated with diabetes ([place]) 05/21/2011  . GERD (gastroesophageal reflux disease) 05/21/2011  . Hyperlipidemia associated with type 2 diabetes mellitus ([place]) 05/21/2011    Past Medical History:  Diagnosis Date  . Chronic kidney disease    RENAL STONE  . Chronic pancreatitis ([place])   . Diabetes mellitus   . Duodenal ulcer   . Dyslipidemia   . GERD (gastroesophageal reflux disease)   . Hypertension   . Osteopenia   . Thyroid disease    HYPOTHYROID  . Vitamin D deficiency     Family History  Problem Relation Age of Onset  . Stroke Mother   . Heart disease Father   . Healthy Daughter   . Healthy Daughter    Past Surgical History:  Procedure Laterality Date  .  APPENDECTOMY    . CATARACT EXTRACTION Right 2014  . CHOLECYSTECTOMY    . TONSILECTOMY, ADENOIDECTOMY, BILATERAL MYRINGOTOMY AND TUBES    . [place]   Social History   Social History Narrative  . Not on file    Objective: Vital Signs: There were no vitals taken for this visit.   Physical Exam   Musculoskeletal Exam: ***  CDAI Exam: No CDAI exam completed.   Investigation: No additional findings.  Imaging: No results found.  Recent Labs: Lab Results  Component Value Date   WBC 10.3 03/18/2018   HGB 12.2 03/18/2018   PLT 327 03/18/2018   NA 134 (L) 03/18/2018   K 4.9 03/18/2018   CL 103 03/18/2018   CO2 24 03/18/2018   GLUCOSE 119 (H) 03/18/2018   BUN 15 03/18/2018   CREATININE 0.89 03/18/2018   BILITOT 0.2 03/18/2018   ALKPHOS 108 12/24/2017   AST 27 03/18/2018   ALT 25 03/18/2018   PROT 6.6 03/18/2018   ALBUMIN 4.0 12/24/2017   CALCIUM 8.9 03/18/2018   GFRAA 74 03/18/2018   QFTBGOLDPLUS NEGATIVE 02/19/2018    Speciality Comments: No specialty comments available.  Procedures:  No procedures performed Allergies: Codeine and Morphine and related   Assessment / Plan:     Visit Diagnoses: No diagnosis found.   Orders: No orders of the defined types were placed in this encounter.  No orders of the defined types were placed in this encounter.   Face-to-face time spent with patient was *** minutes. Greater than 50% of time was spent  in counseling and coordination of care.  Follow-Up Instructions: No follow-ups on file.   Deborah Neas, PA-C  Note - This record has been created using Dragon software.  Chart creation errors have been sought, but may not always  have been located. Such creation errors do not reflect on  the standard of medical care.

## 2018-04-03 ENCOUNTER — Other Ambulatory Visit: Payer: Self-pay

## 2018-04-03 DIAGNOSIS — Z79899 Other long term (current) drug therapy: Secondary | ICD-10-CM | POA: Diagnosis not present

## 2018-04-03 LAB — COMPLETE METABOLIC PANEL WITH GFR
AG Ratio: 1.4 (calc) (ref 1.0–2.5)
ALBUMIN MSPROF: 3.7 g/dL (ref 3.6–5.1)
ALT: 16 U/L (ref 6–29)
AST: 19 U/L (ref 10–35)
Alkaline phosphatase (APISO): 104 U/L (ref 33–130)
BILIRUBIN TOTAL: 0.4 mg/dL (ref 0.2–1.2)
BUN / CREAT RATIO: 11 (calc) (ref 6–22)
BUN: 13 mg/dL (ref 7–25)
CALCIUM: 9 mg/dL (ref 8.6–10.4)
CHLORIDE: 106 mmol/L (ref 98–110)
CO2: 23 mmol/L (ref 20–32)
CREATININE: 1.15 mg/dL — AB (ref 0.60–0.93)
GFR, EST NON AFRICAN AMERICAN: 47 mL/min/{1.73_m2} — AB (ref >=60)
GFR, Est African American: 54 mL/min/{1.73_m2} — ABNORMAL LOW (ref >=60)
GLUCOSE: 251 mg/dL — AB (ref 65–99)
Globulin: 2.7 g/dL (calc) (ref 1.9–3.7)
Potassium: 5.7 mmol/L — ABNORMAL HIGH (ref 3.5–5.3)
Sodium: 137 mmol/L (ref 135–146)
Total Protein: 6.4 g/dL (ref 6.1–8.1)

## 2018-04-03 LAB — CBC WITH DIFFERENTIAL/PLATELET
BASOS ABS: 19 {cells}/uL (ref 0–200)
Basophils Relative: 0.2 %
EOS ABS: 56 {cells}/uL (ref 15–500)
EOS PCT: 0.6 %
HEMATOCRIT: 37.5 % (ref 35.0–45.0)
HEMOGLOBIN: 12.2 g/dL (ref 11.7–15.5)
Lymphs Abs: 2520 cells/uL (ref 850–3900)
MCH: 30.5 pg (ref 27.0–33.0)
MCHC: 32.5 g/dL (ref 32.0–36.0)
MCV: 93.8 fL (ref 80.0–100.0)
MPV: 9.6 fL (ref 7.5–12.5)
Monocytes Relative: 8.2 %
NEUTROS ABS: 5943 {cells}/uL (ref 1500–7800)
NEUTROS PCT: 63.9 %
Platelets: 318 10*3/uL (ref 140–400)
RBC: 4 10*6/uL (ref 3.80–5.10)
RDW: 12.7 % (ref 11.0–15.0)
Total Lymphocyte: 27.1 %
WBC: 9.3 10*3/uL (ref 3.8–10.8)
WBCMIX: 763 {cells}/uL (ref 200–950)

## 2018-04-04 ENCOUNTER — Ambulatory Visit (INDEPENDENT_AMBULATORY_CARE_PROVIDER_SITE_OTHER): Payer: Medicare HMO | Admitting: Family Medicine

## 2018-04-04 ENCOUNTER — Encounter: Payer: Self-pay | Admitting: Family Medicine

## 2018-04-04 ENCOUNTER — Telehealth: Payer: Self-pay

## 2018-04-04 VITALS — BP 104/68 | HR 80 | Temp 98.1°F | Wt 110.6 lb

## 2018-04-04 DIAGNOSIS — M0600 Rheumatoid arthritis without rheumatoid factor, unspecified site: Secondary | ICD-10-CM | POA: Diagnosis not present

## 2018-04-04 DIAGNOSIS — M8589 Other specified disorders of bone density and structure, multiple sites: Secondary | ICD-10-CM

## 2018-04-04 DIAGNOSIS — E118 Type 2 diabetes mellitus with unspecified complications: Secondary | ICD-10-CM | POA: Diagnosis not present

## 2018-04-04 MED ORDER — GLUCOSE BLOOD VI STRP: ORAL_STRIP | 3 refills | Status: DC

## 2018-04-04 NOTE — Progress Notes (Signed)
   Subjective:    Patient ID: Deborah Travis, female    DOB: 1940/10/02, 77 y.o.   MRN: [id number]  HPI She is here for recheck.  She finished taking her steroids approximately 2 weeks ago.  She has been checking her blood sugars regularly.  She has been using 10 units of Lantus.  She continues on [place] but is still having occasional flares.  She did have a DEXA scan recently which did show osteopenia.  Her T score was -2.4   Review of Systems     Objective:   Physical Exam Alert and in no distress.  Right wrist is swollen slightly erythematous and tender. A1c is 6.7      Assessment & Plan:  Type 2 diabetes mellitus with complication, without long-term current use of insulin (HCC) - Plan: glucose blood test strip  Osteopenia of multiple sites  Seronegative rheumatoid arthritis ([place]) She will stop her Lantus insulin and continue on her other diabetes medication.  Discussed the use of calcium and vitamin D to help with her osteopenia.  She will follow-up with Dr. Estanislado Pandy concerning her arthritis.  Recheck here in about 4 months.

## 2018-04-04 NOTE — Telephone Encounter (Signed)
Called patient and patient declined a prednisone taper. I offered patient a sooner appointment and she agreed. Patient is scheduled for 04/08/2018 at 1:20pm.

## 2018-04-04 NOTE — Telephone Encounter (Signed)
Called patient to advise her of lab results and patient states she has flare up every 10 days only last 12 hrs. Cant use hands and shoulders and r knee pain. Pt would like to know what she can take for pain. Please advise.  We prescribe arava and methocarbomal. Patient was prescibed a prednisone taper on 03/04/2018.

## 2018-04-04 NOTE — Progress Notes (Signed)
Glucose is elevated.  Labs are stable.  Please notify patient.  Please fax results to PCP.

## 2018-04-04 NOTE — Telephone Encounter (Signed)
Please give prednisone taper starting at 20 mg and taper by 5 mg every 4 days.  Please a schedule appointment to start her on more aggressive therapy.

## 2018-04-08 ENCOUNTER — Encounter: Payer: Self-pay | Admitting: Physician Assistant

## 2018-04-08 ENCOUNTER — Telehealth: Payer: Self-pay | Admitting: Pharmacy Technician

## 2018-04-08 ENCOUNTER — Ambulatory Visit: Payer: Medicare HMO | Admitting: Physician Assistant

## 2018-04-08 VITALS — BP 92/64 | HR 89 | Resp 12 | Ht 62.75 in | Wt 108.4 lb

## 2018-04-08 DIAGNOSIS — M0600 Rheumatoid arthritis without rheumatoid factor, unspecified site: Secondary | ICD-10-CM | POA: Diagnosis not present

## 2018-04-08 DIAGNOSIS — M8589 Other specified disorders of bone density and structure, multiple sites: Secondary | ICD-10-CM | POA: Diagnosis not present

## 2018-04-08 DIAGNOSIS — E1159 Type 2 diabetes mellitus with other circulatory complications: Secondary | ICD-10-CM | POA: Diagnosis not present

## 2018-04-08 DIAGNOSIS — Z79899 Other long term (current) drug therapy: Secondary | ICD-10-CM | POA: Diagnosis not present

## 2018-04-08 DIAGNOSIS — I10 Essential (primary) hypertension: Secondary | ICD-10-CM

## 2018-04-08 DIAGNOSIS — Z8719 Personal history of other diseases of the digestive system: Secondary | ICD-10-CM

## 2018-04-08 DIAGNOSIS — Z87891 Personal history of nicotine dependence: Secondary | ICD-10-CM

## 2018-04-08 DIAGNOSIS — E1169 Type 2 diabetes mellitus with other specified complication: Secondary | ICD-10-CM

## 2018-04-08 DIAGNOSIS — Z8639 Personal history of other endocrine, nutritional and metabolic disease: Secondary | ICD-10-CM

## 2018-04-08 DIAGNOSIS — M5030 Other cervical disc degeneration, unspecified cervical region: Secondary | ICD-10-CM | POA: Diagnosis not present

## 2018-04-08 DIAGNOSIS — E559 Vitamin D deficiency, unspecified: Secondary | ICD-10-CM | POA: Diagnosis not present

## 2018-04-08 DIAGNOSIS — D692 Other nonthrombocytopenic purpura: Secondary | ICD-10-CM

## 2018-04-08 DIAGNOSIS — I152 Hypertension secondary to endocrine disorders: Secondary | ICD-10-CM

## 2018-04-08 DIAGNOSIS — Z8711 Personal history of peptic ulcer disease: Secondary | ICD-10-CM

## 2018-04-08 DIAGNOSIS — E785 Hyperlipidemia, unspecified: Secondary | ICD-10-CM

## 2018-04-08 MED ORDER — LEFLUNOMIDE 20 MG PO TABS: 20.0000 mg | ORAL_TABLET | Freq: Every day | ORAL | 0 refills | Status: DC

## 2018-04-08 NOTE — Telephone Encounter (Signed)
Received a Prior Authorization request from [employer] for Enbrel Mini. Authorization has been submitted to patient's insurance via Fax. Will update once we receive a response.  Will send document to scan center.  Fax# [phone] Phone# [phone]  3:14 PM Kynnadi Dicenso Delice Lesch, CPhT

## 2018-04-08 NOTE — Patient Instructions (Signed)
Etanercept injection What is this medicine? ETANERCEPT (et a NER sept) is used for the treatment of rheumatoid arthritis in adults and children. The medicine is also used to treat psoriatic arthritis, ankylosing spondylitis, and psoriasis. This medicine may be used for other purposes; ask your health care provider or pharmacist if you have questions. COMMON BRAND NAME(S): Enbrel What should I tell my health care provider before I take this medicine? They need to know if you have any of these conditions: -blood disorders -cancer -congestive heart failure -diabetes -exposure to chickenpox -immune system problems -infection -multiple sclerosis -seizure disorder -tuberculosis, a positive skin test for tuberculosis or have recently been in close contact with someone who has tuberculosis -Wegener's granulomatosis -an unusual or allergic reaction to etanercept, latex, other medicines, foods, dyes, or preservatives -pregnant or trying to get pregnant -breast-feeding How should I use this medicine? The medicine is given by injection under the skin. You will be taught how to prepare and give this medicine. Use exactly as directed. Take your medicine at regular intervals. Do not take your medicine more often than directed. It is important that you put your used needles and syringes in a special sharps container. Do not put them in a trash can. If you do not have a sharps container, call your pharmacist or healthcare provider to get one. A special MedGuide will be given to you by the pharmacist with each prescription and refill. Be sure to read this information carefully each time. Talk to your pediatrician regarding the use of this medicine in children. While this drug may be prescribed for children as young as 4 years of age for selected conditions, precautions do apply. Overdosage: If you think you have taken too much of this medicine contact a poison control center or emergency room at once. NOTE:  This medicine is only for you. Do not share this medicine with others. What if I miss a dose? If you miss a dose, contact your health care professional to find out when you should take your next dose. Do not take double or extra doses without advice. What may interact with this medicine? Do not take this medicine with any of the following medications: -anakinra This medicine may also interact with the following medications: -cyclophosphamide -sulfasalazine -vaccines This list may not describe all possible interactions. Give your health care provider a list of all the medicines, herbs, non-prescription drugs, or dietary supplements you use. Also tell them if you smoke, drink alcohol, or use illegal drugs. Some items may interact with your medicine. What should I watch for while using this medicine? Tell your doctor or healthcare professional if your symptoms do not start to get better or if they get worse. You will be tested for tuberculosis (TB) before you start this medicine. If your doctor prescribes any medicine for TB, you should start taking the TB medicine before starting this medicine. Make sure to finish the full course of TB medicine. Call your doctor or health care professional for advice if you get a fever, chills or sore throat, or other symptoms of a cold or flu. Do not treat yourself. This drug decreases your body's ability to fight infections. Try to avoid being around people who are sick. What side effects may I notice from receiving this medicine? Side effects that you should report to your doctor or health care professional as soon as possible: -allergic reactions like skin rash, itching or hives, swelling of the face, lips, or tongue -changes in vision -fever, chills   or any other sign of infection -numbness or tingling in legs or other parts of the body -red, scaly patches or raised bumps on the skin -shortness of breath or difficulty breathing -swollen lymph nodes in the  neck, underarm, or groin areas -unexplained weight loss -unusual bleeding or bruising -unusual swelling or fluid retention in the legs -unusually weak or tired Side effects that usually do not require medical attention (report to your doctor or health care professional if they continue or are bothersome): -dizziness -headache -nausea -redness, itching, or swelling at the injection site -vomiting This list may not describe all possible side effects. Call your doctor for medical advice about side effects. You may report side effects to FDA at 1-800-FDA-1088. Where should I keep my medicine? Keep out of the reach of children. Store between 2 and 8 degrees C (36 and 46 degrees F). Do not freeze or shake. Protect from light. Throw away any unused medicine after the expiration date. You will be instructed on how to store this medicine. NOTE: This sheet is a summary. It may not cover all possible information. If you have questions about this medicine, talk to your doctor, pharmacist, or health care provider.  2018 Elsevier/Gold Standard (2012-02-18 15:33:36)  

## 2018-04-08 NOTE — Progress Notes (Signed)
Office Visit Note  Patient: Deborah Travis             Date of Birth: 1940/12/24           MRN: [id number]             PCP: [name], MD Referring: [name], MD Visit Date: 04/08/2018 Occupation: @GUAROCC @  Subjective:  Pain in multiple joints   History of Present Illness: Deborah Travis is a 77 y.o. female with history of seronegative rheumatoid arthritis and DDD.  Patient was started on [place] on 03/04/18.  She has been taking Arava 20 mg for the past 2 weeks.  She has been tolerating [place].  She has had 3 flares in the past 1 month.  She declined prednisone last week due to having to be on insulin if she starts on Prednisone.  She has pain in both wrists, shoulders, and hands.  She states the right shoulder constantly aches.  She has swelling in the right hand and wrist joint. She states that at times the right knee feels like it is going to give out on her own.  She denies any mechanical symptoms or injuries.    Activities of Daily Living:  Patient reports morning stiffness for 1 hour.   Patient Reports nocturnal pain.  Difficulty dressing/grooming: Reports Difficulty climbing stairs: Denies Difficulty getting out of chair: Denies Difficulty using hands for taps, buttons, cutlery, and/or writing: Reports  Review of Systems  Constitutional: Negative for fatigue.  HENT: Negative for mouth sores, trouble swallowing, trouble swallowing, mouth dryness and nose dryness.   Eyes: Negative for pain, itching, visual disturbance and dryness.  Respiratory: Negative for cough, hemoptysis, shortness of breath and difficulty breathing.   Cardiovascular: Negative for chest pain, palpitations, hypertension and swelling in legs/feet.  Gastrointestinal: Negative for blood in stool, constipation, diarrhea, nausea and vomiting.  Endocrine: Negative for increased urination.  Genitourinary: Negative for painful urination and pelvic pain.  Musculoskeletal: Positive for arthralgias, joint  pain, joint swelling and morning stiffness. Negative for myalgias, muscle weakness, muscle tenderness and myalgias.  Skin: Positive for hair loss. Negative for color change, pallor, rash, nodules/bumps, skin tightness, ulcers and sensitivity to sunlight.  Allergic/Immunologic: Negative for susceptible to infections.  Neurological: Negative for dizziness, light-headedness, numbness, headaches, memory loss and weakness.  Hematological: Negative for swollen glands.  Psychiatric/Behavioral: Positive for sleep disturbance. Negative for depressed mood and confusion. The patient is not nervous/anxious.     PMFS History:  Patient Active Problem List   Diagnosis Date Noted  . Seronegative rheumatoid arthritis ([place]) 03/04/2018  . Senile purpura ([place]) 02/01/2015  . Former smoker 10/12/2013  . History of peptic ulcer disease 07/13/2013  . Osteopenia 05/07/2013  . Hypothyroidism 01/12/2012  . Type 2 diabetes with complication ([place]) 68/07/7516  . Hypertension associated with diabetes ([place]) 05/21/2011  . GERD (gastroesophageal reflux disease) 05/21/2011  . Hyperlipidemia associated with type 2 diabetes mellitus ([place]) 05/21/2011    Past Medical History:  Diagnosis Date  . Chronic kidney disease    RENAL STONE  . Chronic pancreatitis ([place])   . Diabetes mellitus   . Duodenal ulcer   . Dyslipidemia   . GERD (gastroesophageal reflux disease)   . Hypertension   . Osteopenia   . Thyroid disease    HYPOTHYROID  . Vitamin D deficiency     Family History  Problem Relation Age of Onset  . Stroke Mother   . Heart disease Father   . Healthy Daughter   .  Healthy Daughter    Past Surgical History:  Procedure Laterality Date  . APPENDECTOMY    . CATARACT EXTRACTION Right 2014  . CHOLECYSTECTOMY    . TONSILECTOMY, ADENOIDECTOMY, BILATERAL MYRINGOTOMY AND TUBES    . [place]   Social History   Social History Narrative  . Not on file    Objective: Vital Signs: BP 92/64 (BP  Location: Left Arm, Patient Position: Sitting, Cuff Size: Normal)   Pulse 89   Resp 12   Ht 5' 2.75" (1.594 m)   Wt 108 lb 6.4 oz (49.2 kg)   BMI 19.36 kg/m    Physical Exam  Constitutional: She is oriented to person, place, and time. She appears well-developed and well-nourished.  HENT:  Head: Normocephalic and atraumatic.  Eyes: Conjunctivae and EOM are normal.  Neck: Normal range of motion.  Cardiovascular: Normal rate, regular rhythm, normal heart sounds and intact distal pulses.  Pulmonary/Chest: Effort normal and breath sounds normal.  Abdominal: Soft. Bowel sounds are normal.  Lymphadenopathy:    She has no cervical adenopathy.  Neurological: She is alert and oriented to person, place, and time.  Skin: Skin is warm and dry. Capillary refill takes less than 2 seconds.  Psychiatric: She has a normal mood and affect. Her behavior is normal.  Nursing note and vitals reviewed.    Musculoskeletal Exam: Limited painful ROM of c-spine.  Thoracic and lumbar spine good ROM.  No midline spinal tenderness.  No SI joint tenderness.  Full shoulder ROM with discomfort.  Elbow joints good ROM with no synovitis.  Synovitis of both wrist joints.  Synovitis of right and left 3rd PIP joint.  Hip joints, knee joints, ankle joints, MTPs, PIPs, and DIPs good ROM with no synovitis.  No warmth or effusion of knee joints.  No tenderness of trochanteric bursa bilaterally.    CDAI Homunculus Exam:   Tenderness:  RUE: glenohumeral and wrist LUE: glenohumeral Right hand: 1st MCP, 2nd MCP, 3rd MCP, 4th MCP, 5th MCP and 3rd PIP Left hand: 1st MCP and 3rd PIP  Swelling:  RUE: wrist LUE: wrist Right hand: 1st MCP, 2nd MCP, 3rd MCP, 4th MCP, 5th MCP and 3rd PIP Left hand: 3rd PIP  Joint Counts:  CDAI Tender Joint count: 11 CDAI Swollen Joint count: 9  Global Assessments:  Patient Global Assessment: 7 Provider Global Assessment: 7  CDAI Calculated Score: 34    CDAI Exam: CDAI Score: 18.4    Patient Global Assessment: 7 (mm); Provider Global Assessment: 7 (mm) Swollen: 5 ; Tender: 12  Joint Exam      Right  Left  Glenohumeral   Tender   Tender  Wrist  Swollen Tender     MCP 1  Swollen Tender  Swollen Tender  MCP 2   Tender     MCP 3   Tender     MCP 4   Tender     MCP 5   Tender     PIP 2   Tender     PIP 3  Swollen Tender  Swollen Tender     Investigation: No additional findings.  Imaging: No results found.  Recent Labs: Lab Results  Component Value Date   WBC 9.3 04/03/2018   HGB 12.2 04/03/2018   PLT 318 04/03/2018   NA 137 04/03/2018   K 5.7 (H) 04/03/2018   CL 106 04/03/2018   CO2 23 04/03/2018   GLUCOSE 251 (H) 04/03/2018   BUN 13 04/03/2018   CREATININE 1.15 (  H) 04/03/2018   BILITOT 0.4 04/03/2018   ALKPHOS 108 12/24/2017   AST 19 04/03/2018   ALT 16 04/03/2018   PROT 6.4 04/03/2018   ALBUMIN 4.0 12/24/2017   CALCIUM 9.0 04/03/2018   GFRAA 54 (L) 04/03/2018   QFTBGOLDPLUS NEGATIVE 02/19/2018    Speciality Comments: No specialty comments available.  Procedures:  No procedures performed Allergies: Codeine and Morphine and related   Assessment / Plan:     Visit Diagnoses: Seronegative rheumatoid arthritis ([place]) - RF-,CCP-, 14-3-3 eta negative, Uric acid 4.8, ANA negative no erosive changes on XR of hands. synovitis of bilateral wrists: Patient was started on [place] on 03/04/2018.  She started on Arava 10 mg by mouth for 2 weeks and labs are stable so she increased to Arava 20 mg by mouth daily.  She has been tolerating [place].  She states that she has noticed some hair loss but is not concerned at this time.  She continues to have active synovitis on exam.  She has pain in multiple joints including bilateral shoulders bilateral wrists and bilateral hands.  She has synovitis of bilateral wrists as well as bilateral third PIP joints.  She has tenderness as described above.  She declined prednisone due to having to be placed on insulin when her blood  glucose level increases on prednisone.  We discussed the indications, contraindications, and potential side effects of Enbrel.  Consent was obtained today.  All questions were addressed.  We will apply 3 insurance and once it is approved we will schedule nurse visit for the administration of the first injection.  A refill of [place] will be sent to the pharmacy today.  Medication counseling:   TB Test: -02/19/2018 Hepatitis panel: Negative on 02/19/2018 HIV: Nonreactive on 02/19/2018 SPEP: Patient was referred to hematology for evaluation of the results found and IFE. Immunoglobulins: 02/19/2018  Chest x-ray: Chest x-ray on 11/08/2015 revealed no active lung disease.  Does patient have diagnosis of heart failure?  No  Counseled patient that Enbrel is a TNF blocking agent.  Reviewed Enbrel dose of 50 mg once weekly.  Counseled patient on purpose, proper use, and adverse effects of Enbrel.  Reviewed the most common adverse effects including infections, headache, and injection site reactions. Discussed that there is the possibility of an increased risk of malignancy but it is not well understood if this increased risk is due to the medication or the disease state.  Advised patient to get yearly dermatology exams due to risk of skin cancer.  Reviewed the importance of regular labs while on Enbrel therapy.  Advised patient to get standing labs one month after starting Enbrel then every 2 months.  Provided patient with standing lab orders.  Counseled patient that Enbrel should be held prior to scheduled surgery.  Counseled patient to avoid live vaccines while on Enbrel.  Advised patient to get annual influenza vaccine and the pneumococcal vaccine as needed.  Provided patient with medication education material and answered all questions.  Patient voiced understanding.  Patient consented to Enbrel.  Will upload consent into the media tab.  Reviewed storage instructions for Enbrel.  Advised initial injection must be  administered in office.  Patient voiced understanding.    High risk medication use: Arava 20 mg po qd.  Labs were stable on 04/03/18.  She continues to have active synovitis, so Enbrel will be added to current treatment regimen.   DDD (degenerative disc disease), cervical: She has limited ROM with discomfort.   Osteopenia of multiple sites:  She is on calcium and vitamin D supplement daily.  Vitamin D deficiency: She is on a calcium and vitamin D supplement daily.  Other medical conditions are listed as follows:  Hypertension associated with diabetes ([place])  Hyperlipidemia associated with type 2 diabetes mellitus ([place])  History of gastroesophageal reflux (GERD)  History of peptic ulcer disease  Senile purpura ([place])  History of hypothyroidism  History of chronic pancreatitis  Former smoker   Orders: No orders of the defined types were placed in this encounter.  Meds ordered this encounter  Medications  . leflunomide (ARAVA) 20 MG tablet    Sig: Take 1 tablet (20 mg total) by mouth daily.    Dispense:  90 tablet    Refill:  0    Face-to-face time spent with patient was 30 minutes. Greater than 50% of time was spent in counseling and coordination of care.  Follow-Up Instructions: Return in about 3 months (around 07/09/2018) for Rheumatoid arthritis, DDD.   Ofilia Neas, PA-C  Note - This record has been created using Dragon software.  Chart creation errors have been sought, but may not always  have been located. Such creation errors do not reflect on  the standard of medical care.

## 2018-04-08 NOTE — Progress Notes (Signed)
Pharmacy Medication Counseling Note  Subjective: Patient presents today to the [facility] to see Dr. Estanislado Pandy.   Patient seen by the pharmacist for counseling on Enbrel.    Objective: TB Test: negative 02/19/18 Hepatitis panel: negative 02/19/18  HIV: negative 02/19/18 SPEP: albumin 3.5, Alpha 1 0.5,Alpha 2 1 on 02/19/18 Immunoglobulins: IgM 26  CBC    Component Value Date/Time   WBC 9.3 04/03/2018 1128   RBC 4.00 04/03/2018 1128   HGB 12.2 04/03/2018 1128   HGB 12.5 12/24/2017 1001   HCT 37.5 04/03/2018 1128   HCT 39.0 12/24/2017 1001   PLT 318 04/03/2018 1128   PLT 288 12/24/2017 1001   MCV 93.8 04/03/2018 1128   MCV 96 12/24/2017 1001   MCH 30.5 04/03/2018 1128   MCHC 32.5 04/03/2018 1128   RDW 12.7 04/03/2018 1128   RDW 13.7 12/24/2017 1001   LYMPHSABS 2,520 04/03/2018 1128   LYMPHSABS 2.2 12/24/2017 1001   MONOABS 768 06/15/2016 0811   EOSABS 56 04/03/2018 1128   EOSABS 0.1 12/24/2017 1001   BASOSABS 19 04/03/2018 1128   BASOSABS 0.0 12/24/2017 1001    Does patient have diagnosis of heart failure?  No  Assessment/Plan:  Counseled patient that Enbrel is a TNF blocking agent.  Reviewed Enbrel dose of 50 mg once weekly.  Counseled patient on purpose, proper use, and adverse effects of Enbrel.  Reviewed the most common adverse effects including infections, headache, and injection site reactions. Discussed that there is the possibility of an increased risk of malignancy but it is not well understood if this increased risk is due to the medication or the disease state.  Advised patient to get yearly dermatology exams due to risk of skin cancer.  Reviewed the importance of regular labs while on Enbrel therapy.  Advised patient to get standing labs one month after starting Enbrel then every 2 months.  Provided patient with standing lab orders.  Counseled patient that Enbrel should be held prior to scheduled surgery.  Counseled patient to avoid live vaccines while on  Enbrel.  Advised patient to get annual influenza vaccine and the pneumococcal vaccine as needed.  Provided patient with medication education material and answered all questions.  Patient voiced understanding.  Patient consented to Enbrel.  Will upload consent into the media tab.  Reviewed storage instructions for Enbrel.  Advised initial injection must be administered in office.  Patient voiced understanding.    Mariella Saa, PharmD, [facility] Rheumatology Clinical Pharmacist  04/08/2018 3:01 PM

## 2018-04-09 ENCOUNTER — Other Ambulatory Visit: Payer: Self-pay | Admitting: Pharmacist

## 2018-04-09 DIAGNOSIS — M0600 Rheumatoid arthritis without rheumatoid factor, unspecified site: Secondary | ICD-10-CM

## 2018-04-09 DIAGNOSIS — M5030 Other cervical disc degeneration, unspecified cervical region: Secondary | ICD-10-CM

## 2018-04-09 MED ORDER — ETANERCEPT 50 MG/ML ~~LOC~~ SOAJ: 50.0000 mg | SUBCUTANEOUS | 0 refills | Status: DC

## 2018-04-09 MED ORDER — ETANERCEPT 50 MG/ML ~~LOC~~ SOCT: 50.0000 mg | SUBCUTANEOUS | 0 refills | Status: DC

## 2018-04-09 NOTE — Progress Notes (Signed)
Enbrel PA approved.  Prescription sent to [facility].

## 2018-04-09 NOTE — Addendum Note (Signed)
Addended by: Mariella Saa C on: 04/09/2018 04:11 PM   Modules accepted: Orders

## 2018-04-09 NOTE — Telephone Encounter (Signed)
Received a fax from [facility]  regarding a prior authorization for Enbrel Mini. Authorization has been APPROVED from 04/09/18 to 04/09/20.   Test claim- $8.50 copay  Will send document to scan center.  Please send new prescription to [facility] Phone # [phone]  3:10 PM Beatriz Chancellor, CPhT

## 2018-04-10 ENCOUNTER — Telehealth: Payer: Self-pay | Admitting: Pharmacy Technician

## 2018-04-10 NOTE — Telephone Encounter (Signed)
Left message for patient to call me back to schedule her 1st shipment with [facility].  10:37 AM Beatriz Chancellor, CPhT

## 2018-04-14 ENCOUNTER — Ambulatory Visit: Payer: Medicare HMO | Admitting: Physician Assistant

## 2018-04-15 MED FILL — ENBREL MINI 50 MG/ML SOCT: 50 | 28 days supply | Qty: 4 | Fill #0

## 2018-04-15 NOTE — Telephone Encounter (Signed)
Spoke to patient and scheduled 1st shipment of Enbrel Mini from [facility]. Will be brought to clinic on Thursday. Scheduled patient for nurse visit on Friday, August 23 @ 2pm. Patient has no questions at this time.  11:42 AM Beatriz Chancellor, CPhT

## 2018-04-17 ENCOUNTER — Ambulatory Visit: Payer: Medicare HMO | Admitting: Rheumatology

## 2018-04-17 VITALS — BP 91/56

## 2018-04-17 DIAGNOSIS — M0600 Rheumatoid arthritis without rheumatoid factor, unspecified site: Secondary | ICD-10-CM | POA: Diagnosis not present

## 2018-04-17 MED ORDER — ETANERCEPT 50 MG/ML ~~LOC~~ SOCT
50.0000 mg | Freq: Once | SUBCUTANEOUS | Status: AC
  Administered 2018-04-17: 50 mg via SUBCUTANEOUS

## 2018-04-17 NOTE — Patient Instructions (Signed)
Standing Labs We placed an order today for your standing lab work.    Please come back and get your standing labs in 1 month and then every 3 months.  We have open lab Monday through Friday from 8:30-11:30 AM and 1:30-4:00 PM  at the office of Dr. [name].   You may experience shorter wait times on Monday and Friday afternoons.  Etanercept injection What is this medicine? ETANERCEPT (et a [employer]) is used for the treatment of rheumatoid arthritis in adults and children. The medicine is also used to treat psoriatic arthritis, ankylosing spondylitis, and psoriasis. This medicine may be used for other purposes; ask your health care provider or pharmacist if you have questions. COMMON BRAND NAME(S): Enbrel What should I tell my health care provider before I take this medicine? They need to know if you have any of these conditions: -blood disorders -cancer -congestive heart failure -diabetes -exposure to chickenpox -immune system problems -infection -multiple sclerosis -seizure disorder -tuberculosis, a positive skin test for tuberculosis or have recently been in close contact with someone who has tuberculosis -Wegener's granulomatosis -an unusual or allergic reaction to etanercept, latex, other medicines, foods, dyes, or preservatives -pregnant or trying to get pregnant -breast-feeding How should I use this medicine? The medicine is given by injection under the skin. You will be taught how to prepare and give this medicine. Use exactly as directed. Take your medicine at regular intervals. Do not take your medicine more often than directed. It is important that you put your used needles and syringes in a special sharps container. Do not put them in a trash can. If you do not have a sharps container, call your pharmacist or healthcare provider to get one. A special MedGuide will be given to you by the pharmacist with each prescription and refill. Be sure to read this information  carefully each time. Talk to your pediatrician regarding the use of this medicine in children. While this drug may be prescribed for children as young as 64 years of age for selected conditions, precautions do apply. Overdosage: If you think you have taken too much of this medicine contact a poison control center or emergency room at once. NOTE: This medicine is only for you. Do not share this medicine with others. What if I miss a dose? If you miss a dose, contact your health care professional to find out when you should take your next dose. Do not take double or extra doses without advice. What may interact with this medicine? Do not take this medicine with any of the following medications: -anakinra This medicine may also interact with the following medications: -cyclophosphamide -sulfasalazine -vaccines This list may not describe all possible interactions. Give your health care provider a list of all the medicines, herbs, non-prescription drugs, or dietary supplements you use. Also tell them if you smoke, drink alcohol, or use illegal drugs. Some items may interact with your medicine. What should I watch for while using this medicine? Tell your doctor or healthcare professional if your symptoms do not start to get better or if they get worse. You will be tested for tuberculosis (TB) before you start this medicine. If your doctor prescribes any medicine for TB, you should start taking the TB medicine before starting this medicine. Make sure to finish the full course of TB medicine. Call your doctor or health care professional for advice if you get a fever, chills or sore throat, or other symptoms of a cold or flu. Do not  treat yourself. This drug decreases your body's ability to fight infections. Try to avoid being around people who are sick. What side effects may I notice from receiving this medicine? Side effects that you should report to your doctor or health care professional as soon as  possible: -allergic reactions like skin rash, itching or hives, swelling of the face, lips, or tongue -changes in vision -fever, chills or any other sign of infection -numbness or tingling in legs or other parts of the body -red, scaly patches or raised bumps on the skin -shortness of breath or difficulty breathing -swollen lymph nodes in the neck, underarm, or groin areas -unexplained weight loss -unusual bleeding or bruising -unusual swelling or fluid retention in the legs -unusually weak or tired Side effects that usually do not require medical attention (report to your doctor or health care professional if they continue or are bothersome): -dizziness -headache -nausea -redness, itching, or swelling at the injection site -vomiting This list may not describe all possible side effects. Call your doctor for medical advice about side effects. You may report side effects to FDA at 1-800-FDA-1088. Where should I keep my medicine? Keep out of the reach of children. Store between 2 and 8 degrees C (36 and 46 degrees F). Do not freeze or shake. Protect from light. Throw away any unused medicine after the expiration date. You will be instructed on how to store this medicine. NOTE: This sheet is a summary. It may not cover all possible information. If you have questions about this medicine, talk to your doctor, pharmacist, or health care provider.  2018 Elsevier/Gold Standard (2012-02-18 15:33:36)  The office is located at [address], [place], [place],  [address] No appointment is necessary.   Labs are drawn by [facility].  You may receive a bill from [facility] for your lab work. If you have any questions regarding directions or hours of operation,  please call [phone].

## 2018-04-17 NOTE — Telephone Encounter (Signed)
Enbrel Mini has been received from [facility] and placed in fridge. Patient has nurse visit scheduled for tomorrow, 04/18/18.  1:51 PM Beatriz Chancellor, CPhT

## 2018-04-17 NOTE — Progress Notes (Signed)
Pharmacy Counseling Note  Subjective: Patient presents today to the [facility] to see Dr. Estanislado Pandy.   Patient seen by the pharmacist for counseling on Enbrel Mini injection technique.    Objective: TB Test: 02/19/2018 Hepatitis panel: Negative on 02/19/2018 HIV: Nonreactive on 02/19/2018 SPEP: Patient was referred to hematology for evaluation of the results found and IFE. Immunoglobulins: 02/19/2018   CBC    Component Value Date/Time   WBC 9.3 04/03/2018 1128   RBC 4.00 04/03/2018 1128   HGB 12.2 04/03/2018 1128   HGB 12.5 12/24/2017 1001   HCT 37.5 04/03/2018 1128   HCT 39.0 12/24/2017 1001   PLT 318 04/03/2018 1128   PLT 288 12/24/2017 1001   MCV 93.8 04/03/2018 1128   MCV 96 12/24/2017 1001   MCH 30.5 04/03/2018 1128   MCHC 32.5 04/03/2018 1128   RDW 12.7 04/03/2018 1128   RDW 13.7 12/24/2017 1001   LYMPHSABS 2,520 04/03/2018 1128   LYMPHSABS 2.2 12/24/2017 1001   MONOABS 768 06/15/2016 0811   EOSABS 56 04/03/2018 1128   EOSABS 0.1 12/24/2017 1001   BASOSABS 19 04/03/2018 1128   BASOSABS 0.0 12/24/2017 1001    Does patient have diagnosis of heart failure?  No  Assessment/Plan:  Counseled patient that Enbrel is a TNF blocking agent.  Reviewed Enbrel dose of 50 mg once weekly.  Counseled patient on purpose, proper use, and adverse effects of Enbrel.  Reviewed the most common adverse effects including infections, headache, and injection site reactions. Discussed that there is the possibility of an increased risk of malignancy but it is not well understood if this increased risk is due to the medication or the disease state.  Advised patient to get yearly dermatology exams due to risk of skin cancer.  Reviewed the importance of regular labs while on Enbrel therapy.  Advised patient to get standing labs one month after starting Enbrel then every 2 months.  Provided patient with standing lab orders.  Counseled patient that Enbrel should be held prior to scheduled  surgery.  Counseled patient to avoid live vaccines while on Enbrel.  Advised patient to get annual influenza vaccine and the pneumococcal vaccine as needed.  Provided patient with medication education material and answered all questions.  Patient voiced understanding.  Demonstrated proper injection technique with Enbrel demo pen.  Patient able to demonstrate proper injection technique using the teach back method.  Patient self injected in the right lower abdomen with patient supplied medication:  Medication: Enbrel Mini NDC: [id number] Lot: [id number] Expiration: 01/2020  Patient tolerated well.  Observed for 30 mins in office for adverse reaction and no signs/symptoms of allergic reaction or localized irritaition.  Patient should come back in 1 month and then every 3 months for labs.  Instructions for standing labs in AVS.  Encouraged patient to call with any questions/issues.    Blood pressure low prior to and post injection.  Patient stated it was lower at her last visit.  Explained that her arm is very small and it could be that our smallest cuff is too large resulting in lower inaccurate reading.  Also mentioned it could be due to dehydration. Encouraged patient to stay hydrated and continue to monitor blood pressure as well as alert her PCP if continue to be an issue or she experiences dizziness.  Mariella Saa, PharmD, [facility] Rheumatology Clinical Pharmacist  04/17/2018 2:13 PM

## 2018-04-18 ENCOUNTER — Ambulatory Visit: Payer: Medicare HMO

## 2018-04-29 ENCOUNTER — Ambulatory Visit: Payer: Medicare HMO | Admitting: Family Medicine

## 2018-05-12 ENCOUNTER — Other Ambulatory Visit: Payer: Self-pay | Admitting: Rheumatology

## 2018-05-12 NOTE — Telephone Encounter (Signed)
Last Visit: 04/17/18 Next Visit: 07/08/18 Labs: 04/03/18 Glucose is elevated. Labs are stable.  TB Gold: 02/19/18 Neg   Okay to refill per Dr. Estanislado Pandy

## 2018-05-13 MED FILL — ENBREL MINI 50 MG/ML SOCT: 50 | 28 days supply | Qty: 4 | Fill #0

## 2018-05-15 ENCOUNTER — Other Ambulatory Visit: Payer: Self-pay

## 2018-05-15 ENCOUNTER — Ambulatory Visit: Payer: Medicare HMO

## 2018-05-15 DIAGNOSIS — Z79899 Other long term (current) drug therapy: Secondary | ICD-10-CM

## 2018-05-16 ENCOUNTER — Telehealth: Payer: Self-pay

## 2018-05-16 ENCOUNTER — Other Ambulatory Visit: Payer: Self-pay | Admitting: *Deleted

## 2018-05-16 DIAGNOSIS — E875 Hyperkalemia: Secondary | ICD-10-CM | POA: Diagnosis not present

## 2018-05-16 LAB — COMPLETE METABOLIC PANEL WITH GFR
AG RATIO: 1.5 (calc) (ref 1.0–2.5)
ALBUMIN MSPROF: 3.9 g/dL (ref 3.6–5.1)
ALKALINE PHOSPHATASE (APISO): 127 U/L (ref 33–130)
ALT: 21 U/L (ref 6–29)
AST: 23 U/L (ref 10–35)
BUN / CREAT RATIO: 10 (calc) (ref 6–22)
BUN: 12 mg/dL (ref 7–25)
CO2: 24 mmol/L (ref 20–32)
CREATININE: 1.17 mg/dL — AB (ref 0.60–0.93)
Calcium: 9.8 mg/dL (ref 8.6–10.4)
Chloride: 110 mmol/L (ref 98–110)
GFR, EST NON AFRICAN AMERICAN: 46 mL/min/{1.73_m2} — AB (ref >=60)
GFR, Est African American: 53 mL/min/{1.73_m2} — ABNORMAL LOW (ref >=60)
GLOBULIN: 2.6 g/dL (ref 1.9–3.7)
Glucose, Bld: 90 mg/dL (ref 65–99)
POTASSIUM: 6.9 mmol/L — AB (ref 3.5–5.3)
SODIUM: 141 mmol/L (ref 135–146)
Total Bilirubin: 0.4 mg/dL (ref 0.2–1.2)
Total Protein: 6.5 g/dL (ref 6.1–8.1)

## 2018-05-16 LAB — CBC WITH DIFFERENTIAL/PLATELET
BASOS PCT: 0.3 %
Basophils Absolute: 22 cells/uL (ref 0–200)
EOS PCT: 1.6 %
Eosinophils Absolute: 118 cells/uL (ref 15–500)
HCT: 38.3 % (ref 35.0–45.0)
Hemoglobin: 12.4 g/dL (ref 11.7–15.5)
LYMPHS ABS: 3374 {cells}/uL (ref 850–3900)
MCH: 30.2 pg (ref 27.0–33.0)
MCHC: 32.4 g/dL (ref 32.0–36.0)
MCV: 93.2 fL (ref 80.0–100.0)
MPV: 10.6 fL (ref 7.5–12.5)
Monocytes Relative: 12.1 %
NEUTROS PCT: 40.4 %
Neutro Abs: 2990 cells/uL (ref 1500–7800)
PLATELETS: 276 10*3/uL (ref 140–400)
RBC: 4.11 10*6/uL (ref 3.80–5.10)
RDW: 13.1 % (ref 11.0–15.0)
TOTAL LYMPHOCYTE: 45.6 %
WBC: 7.4 10*3/uL (ref 3.8–10.8)
WBCMIX: 895 {cells}/uL (ref 200–950)

## 2018-05-16 LAB — POTASSIUM: Potassium: 5.8 mmol/L — ABNORMAL HIGH (ref 3.5–5.3)

## 2018-05-16 NOTE — Progress Notes (Signed)
I received a phone call from answering service last night regarding high potassium of 6.9.  We contacted patient this morning.  Repeat potassium is a still elevated but better at 5.8.  Her PCP was notified and patient was also notified.  Most likely that high potassium is related to ACE inhibitor.

## 2018-05-16 NOTE — Telephone Encounter (Signed)
Called pt to advise to stop linsipril  And to come in on Monday. [place]

## 2018-05-16 NOTE — Progress Notes (Signed)
Labs are stable.

## 2018-05-19 ENCOUNTER — Encounter: Payer: Self-pay | Admitting: Family Medicine

## 2018-05-19 ENCOUNTER — Ambulatory Visit (INDEPENDENT_AMBULATORY_CARE_PROVIDER_SITE_OTHER): Payer: Medicare HMO | Admitting: Family Medicine

## 2018-05-19 ENCOUNTER — Telehealth: Payer: Self-pay

## 2018-05-19 VITALS — BP 116/72 | HR 68 | Temp 98.0°F | Wt 108.4 lb

## 2018-05-19 DIAGNOSIS — Z23 Encounter for immunization: Secondary | ICD-10-CM

## 2018-05-19 DIAGNOSIS — E875 Hyperkalemia: Secondary | ICD-10-CM

## 2018-05-19 LAB — BASIC METABOLIC PANEL
BUN / CREAT RATIO: 12 (ref 12–28)
BUN: 12 mg/dL (ref 8–27)
CO2: 21 mmol/L (ref 20–29)
CREATININE: 1.01 mg/dL — AB (ref 0.57–1.00)
Calcium: 8.8 mg/dL (ref 8.7–10.3)
Chloride: 107 mmol/L — ABNORMAL HIGH (ref 96–106)
GFR calc non Af Amer: 55 mL/min/{1.73_m2} — ABNORMAL LOW (ref >59)
GFR, EST AFRICAN AMERICAN: 63 mL/min/{1.73_m2} (ref >59)
GLUCOSE: 108 mg/dL — AB (ref 65–99)
Potassium: 5.2 mmol/L (ref 3.5–5.2)
SODIUM: 138 mmol/L (ref 134–144)

## 2018-05-19 NOTE — Telephone Encounter (Signed)
Per dr.lalonde pt is to stay off lisinopril until diabetes check. Pt was called to advise. [place]

## 2018-05-19 NOTE — Progress Notes (Signed)
   Subjective:    Patient ID: Deborah Travis, female    DOB: 06-13-1941, 77 y.o.   MRN: [id number]  HPI She is here for recheck.  Recent blood work did show elevated potassium.  I did have her stop potassium last Friday.  She has been on that for quite some time for kidney protection.  Presently she is having no other symptoms.   Review of Systems     Objective:   Physical Exam Alert and in no distress otherwise not examined       Assessment & Plan:  Hyperkalemia - Plan: Basic Metabolic Panel  Need for influenza vaccination - Plan: Flu vaccine HIGH DOSE PF (Fluzone High dose) She will continue to hold the lisinopril.

## 2018-05-20 ENCOUNTER — Telehealth: Payer: Self-pay

## 2018-05-20 DIAGNOSIS — H40013 Open angle with borderline findings, low risk, bilateral: Secondary | ICD-10-CM | POA: Diagnosis not present

## 2018-05-20 DIAGNOSIS — H40053 Ocular hypertension, bilateral: Secondary | ICD-10-CM | POA: Diagnosis not present

## 2018-05-20 DIAGNOSIS — Z961 Presence of intraocular lens: Secondary | ICD-10-CM | POA: Diagnosis not present

## 2018-05-20 DIAGNOSIS — E119 Type 2 diabetes mellitus without complications: Secondary | ICD-10-CM | POA: Diagnosis not present

## 2018-05-20 LAB — HM DIABETES EYE EXAM

## 2018-05-20 NOTE — Telephone Encounter (Signed)
Spoke to pt and advised per Dr Redmond School she is ok to take half of her lisinopril . Deborah Travis

## 2018-05-26 DIAGNOSIS — H409 Unspecified glaucoma: Secondary | ICD-10-CM | POA: Insufficient documentation

## 2018-05-26 DIAGNOSIS — H35039 Hypertensive retinopathy, unspecified eye: Secondary | ICD-10-CM | POA: Insufficient documentation

## 2018-06-02 ENCOUNTER — Ambulatory Visit (INDEPENDENT_AMBULATORY_CARE_PROVIDER_SITE_OTHER): Payer: Medicare HMO | Admitting: Family Medicine

## 2018-06-02 ENCOUNTER — Encounter: Payer: Self-pay | Admitting: Family Medicine

## 2018-06-02 VITALS — BP 110/68 | HR 91 | Temp 98.0°F | Wt 105.2 lb

## 2018-06-02 DIAGNOSIS — M0600 Rheumatoid arthritis without rheumatoid factor, unspecified site: Secondary | ICD-10-CM

## 2018-06-02 DIAGNOSIS — H6121 Impacted cerumen, right ear: Secondary | ICD-10-CM | POA: Diagnosis not present

## 2018-06-02 DIAGNOSIS — J209 Acute bronchitis, unspecified: Secondary | ICD-10-CM

## 2018-06-02 MED ORDER — AMOXICILLIN 875 MG PO TABS: 875.0000 mg | ORAL_TABLET | Freq: Two times a day (BID) | ORAL | 0 refills | Status: DC

## 2018-06-02 NOTE — Patient Instructions (Signed)
You can take 2 Tylenol 4 times per day as needed for the aches and pains

## 2018-06-02 NOTE — Progress Notes (Signed)
   Subjective:    Patient ID: Deborah Travis, female    DOB: December 23, 1940, 77 y.o.   MRN: [id number]  HPI She complains of a 7-day history of difficulty with sneezing, rhinorrhea, nasal congestion, PND and a dry cough that has become productive as well as chills but no fever or shortness of breath.  She also has difficulty hearing from the right ear.  She continues to complain of neck shoulder and hand pain.  Continues on medications listed in the chart.  She has not taken any other pain medications.   Review of Systems     Objective:   Physical Exam Alert and in no distress. Tympanic membranes and canals are normal after cerumen was lavaged and manually removed from the right canal.. Pharyngeal area is normal. Neck is supple without adenopathy or thyromegaly. Cardiac exam shows a regular sinus rhythm without murmurs or gallops. Lungs are clear to auscultation.       Assessment & Plan:  Acute bronchitis, unspecified organism - Plan: amoxicillin (AMOXIL) 875 MG tablet  Seronegative rheumatoid arthritis (HCC)  Impacted cerumen of right ear She is to call if not entirely better when she finishes the antibiotic.  Also recommend taking Tylenol 2 tablets 4 times per day for the aches and pains.

## 2018-06-11 MED FILL — ENBREL MINI 50 MG/ML SOCT: 50 | 28 days supply | Qty: 4 | Fill #1

## 2018-06-17 ENCOUNTER — Other Ambulatory Visit: Payer: Self-pay | Admitting: Physician Assistant

## 2018-06-17 NOTE — Telephone Encounter (Signed)
Last visit: 04/17/2018 Next visit: 07/08/2018 Labs: 05/15/2018 stable.    Okay to refill per Dr. Estanislado Pandy.

## 2018-06-24 NOTE — Progress Notes (Signed)
Office Visit Note  Patient: Deborah Travis             Date of Birth: 1941-05-16           MRN: [id number]             PCP: [name], MD Referring: [name], MD Visit Date: 07/08/2018 Occupation: @GUAROCC @  Subjective:  Left wrist pain and swelling      History of Present Illness: Deborah Travis is a 77 y.o. female with history of seronegative rheumatoid arthritis and DDD. Her current treatment regimen includes Enbrel Mini 50 mg subq weekly (started on 04/17/18) and Arava 20 mg po daily (started on 03/04/18).  She reports she continues to have sporadic flares, but she reports the flares are less frequent, shorter in duration, and are not as severe. She states she is currently having left wrist pain and swelling.  She denies any other joint pain or joint swelling at this time. She states she was diagnosed with pneumonia on Friday.  She was started on Augmentin BID 7-day course, prednisone taper, and has been using inhaler 4 times daily.  She states she is also been taking probiotics due to experiencing diarrhea 5-10 times per day since being on amoxicillin.  She states that she did not hold her dose of Enbrel on Sunday.  She states that she has not had a fever since Friday but continues to have a productive cough and fatigue.  She will be following up with her primary care on Friday.    Activities of Daily Living:  Patient reports morning stiffness for2 minutes.   Patient Denies nocturnal pain.  Difficulty dressing/grooming: Denies Difficulty climbing stairs: Denies Difficulty getting out of chair: Denies Difficulty using hands for taps, buttons, cutlery, and/or writing: Denies  Review of Systems  Constitutional: Positive for fatigue.  HENT: Positive for mouth dryness. Negative for mouth sores and nose dryness.   Eyes: Negative for pain, visual disturbance and dryness.  Respiratory: Positive for cough. Negative for hemoptysis, shortness of breath and difficulty breathing.     Cardiovascular: Negative for chest pain, palpitations, hypertension and swelling in legs/feet.  Gastrointestinal: Positive for diarrhea (SE of amoxicillin ). Negative for blood in stool and constipation.  Endocrine: Negative for increased urination.  Genitourinary: Negative for painful urination.  Musculoskeletal: Positive for arthralgias, joint pain, joint swelling and morning stiffness. Negative for myalgias, muscle weakness, muscle tenderness and myalgias.  Skin: Negative for color change, pallor, rash, hair loss, nodules/bumps, skin tightness, ulcers and sensitivity to sunlight.  Allergic/Immunologic: Negative for susceptible to infections.  Neurological: Negative for dizziness, numbness, headaches and weakness.  Hematological: Negative for swollen glands.  Psychiatric/Behavioral: Negative for depressed mood and sleep disturbance. The patient is not nervous/anxious.     PMFS History:  Patient Active Problem List   Diagnosis Date Noted  . Glaucoma 05/26/2018  . Hypertensive retinopathy 05/26/2018  . Seronegative rheumatoid arthritis ([place]) 03/04/2018  . Senile purpura ([place]) 02/01/2015  . Former smoker 10/12/2013  . History of peptic ulcer disease 07/13/2013  . Osteopenia 05/07/2013  . Hypothyroidism 01/12/2012  . Type 2 diabetes with complication ([place]) 40/98/1191  . Hypertension associated with diabetes ([place]) 05/21/2011  . GERD (gastroesophageal reflux disease) 05/21/2011  . Hyperlipidemia associated with type 2 diabetes mellitus ([place]) 05/21/2011    Past Medical History:  Diagnosis Date  . Chronic kidney disease    RENAL STONE  . Chronic pancreatitis ([place])   . Diabetes mellitus   . Duodenal  ulcer   . Dyslipidemia   . GERD (gastroesophageal reflux disease)   . Hypertension   . Osteopenia   . Thyroid disease    HYPOTHYROID  . Vitamin D deficiency     Family History  Problem Relation Age of Onset  . Stroke Mother   . Heart disease Father   . Healthy Daughter   .  Healthy Daughter    Past Surgical History:  Procedure Laterality Date  . APPENDECTOMY    . CATARACT EXTRACTION Right 2014  . CHOLECYSTECTOMY    . TONSILECTOMY, ADENOIDECTOMY, BILATERAL MYRINGOTOMY AND TUBES    . [place]   Social History   Social History Narrative  . Not on file   Immunization History  Administered Date(s) Administered  . Influenza Split 05/21/2011, 06/12/2012, 07/15/2013  . Influenza, High Dose Seasonal PF 07/13/2013, 07/01/2014, 06/01/2015, 06/15/2016, 04/15/2017, 05/19/2018  . Pneumococcal Conjugate-13 06/15/2016  . Pneumococcal Polysaccharide-23 06/12/2012  . Tdap 06/28/2011  . Zoster 06/27/2013   Objective: Vital Signs: BP 102/69 (BP Location: Left Arm, Patient Position: Sitting, Cuff Size: Normal)   Pulse 98   Resp 13   Ht 5' 2.75" (1.594 m)   Wt 102 lb (46.3 kg)   BMI 18.21 kg/m    Physical Exam  Constitutional: She is oriented to person, place, and time. She appears well-developed and well-nourished.  HENT:  Head: Normocephalic and atraumatic.  Eyes: Conjunctivae and EOM are normal.  Neck: Normal range of motion.  Cardiovascular: Normal rate, regular rhythm, normal heart sounds and intact distal pulses.  Pulmonary/Chest: Effort normal and breath sounds normal.  Abdominal: Soft. Bowel sounds are normal.  Lymphadenopathy:    She has no cervical adenopathy.  Neurological: She is alert and oriented to person, place, and time.  Skin: Skin is warm and dry. Capillary refill takes less than 2 seconds.  Psychiatric: She has a normal mood and affect. Her behavior is normal.  Nursing note and vitals reviewed.    Musculoskeletal Exam: C-spine limited range of motion.  Thoracic and lumbar spine good range of motion.  No midline spinal tenderness.  No SI joint tenderness.  Shoulder joints full range of motion with no discomfort.  Elbow joints good range of motion no synovitis.  Wrist joints good range of motion.  She has left wrist synovitis  and tenderness.  Right wrist no tenderness or synovitis noted.  No synovitis of MCP or PIP joints.  Hip joints, knee joints, ankle joints and MTPs and PIPs and DIPs good range of motion no synovitis.  No warmth or effusion bilateral knee joints.  No tenderness or swelling of ankle joints.  No tenderness of trochanteric bursa bilaterally.  CDAI Exam: CDAI Score: 2.8  Patient Global Assessment: 4 (mm); Provider Global Assessment: 4 (mm) Swollen: 1 ; Tender: 1  Joint Exam      Right  Left  Wrist     Swollen Tender     Investigation: No additional findings.  Imaging: No results found.  Recent Labs: Lab Results  Component Value Date   WBC 7.4 05/15/2018   HGB 12.4 05/15/2018   PLT 276 05/15/2018   NA 138 05/19/2018   K 5.2 05/19/2018   CL 107 (H) 05/19/2018   CO2 21 05/19/2018   GLUCOSE 108 (H) 05/19/2018   BUN 12 05/19/2018   CREATININE 1.01 (H) 05/19/2018   BILITOT 0.4 05/15/2018   ALKPHOS 108 12/24/2017   AST 23 05/15/2018   ALT 21 05/15/2018   PROT 6.5 05/15/2018  ALBUMIN 4.0 12/24/2017   CALCIUM 8.8 05/19/2018   GFRAA 63 05/19/2018   QFTBGOLDPLUS NEGATIVE 02/19/2018    Speciality Comments: No specialty comments available.  Procedures:  No procedures performed Allergies: Codeine and Morphine and related   Recommend Pneumovax 23 and Shingrix as indicated.  Assessment / Plan:     Visit Diagnoses: Seronegative rheumatoid arthritis ([place]) - RF-,CCP-, 14-3-3 eta negative, Uric acid 4.8, ANA negative no erosive changes on XR of hands: She has left wrist synovitis and tenderness. Her current treatment regimen includes Enbrel Mini 50 mg subq weekly (started on 04/17/18) and Arava 20 mg po daily (started on 03/04/18).  She has been having less frequent, shorter duration, and less severe flares.  She has no other joint pain or joint swelling at this time.  She will continue on his current treatment regimen.  She was diagnosed with pneumonia on Friday and was started on Augmentin  twice daily 7-day course as well as a Medrol Dosepak.  She took her most recent Enbrel injection on Sunday.  She was advised to hold her next dose of Enbrel until she is finished antibiotics and is feeling better.  We discussed the importance of holding Enbrel anytime she has an infection due to the immunosuppressive properties.  She voiced understanding.  She was advised to notify us if she develops increased joint pain or joint swelling.  She will follow-up in the office in 5 months.  High risk medication use - Started on Enbrel 50 mg sq inj once weekly and Arava 20 mg by mouth daily.  Last TB gold negative on 02/22/18. Most recent CBC/CMP within normal limits on 05/15/18 except potassium critical value of 6.9,  PCP and patient notified.  PCP instructed patient to stop lisinopril and potassium now within normal limits.  Next CBC/CMP due end of December and then every 3 months to monitor for drug toxicity.  Standing orders are in place.   DDD (degenerative disc disease), cervical:  She has limited ROM.  She has no symptoms of radiculopathy at this time.   Osteopenia of multiple sites: She takes a calcium and vitamin D supplement on a daily basis.  Vitamin D deficiency: She takes a vitamin D supplement on a daily basis.  Serum potassium elevated: Potassium critical value of 6.9 on 05/15/18,  PCP and patient notified.  PCP instructed patient to stop lisinopril and potassium now within normal limits.   Other medical conditions are listed as follows:  Hypertension associated with diabetes ([place])  Hyperlipidemia associated with type 2 diabetes mellitus ([place])  History of gastroesophageal reflux (GERD)  History of peptic ulcer disease  Senile purpura ([place])  History of hypothyroidism  History of chronic pancreatitis  Former smoker   Orders: No orders of the defined types were placed in this encounter.  No orders of the defined types were placed in this encounter.   Face-to-face time spent  with patient was 30 minutes. Greater than 50% of time was spent in counseling and coordination of care.  Follow-Up Instructions: Return in about 5 months (around 12/07/2018) for Rheumatoid arthritis, DDD.   Ofilia Neas, PA-C  Note - This record has been created using Dragon software.  Chart creation errors have been sought, but may not always  have been located. Such creation errors do not reflect on  the standard of medical care.

## 2018-07-01 ENCOUNTER — Telehealth: Payer: Self-pay

## 2018-07-01 ENCOUNTER — Other Ambulatory Visit: Payer: Self-pay

## 2018-07-01 DIAGNOSIS — E119 Type 2 diabetes mellitus without complications: Secondary | ICD-10-CM

## 2018-07-01 MED ORDER — METFORMIN HCL 1000 MG PO TABS: ORAL_TABLET | ORAL | 1 refills | Status: DC

## 2018-07-01 NOTE — Telephone Encounter (Signed)
Patient has called and requested a refill on her Metformin. Please advise.

## 2018-07-01 NOTE — Telephone Encounter (Signed)
Done and pt called . KH 

## 2018-07-04 ENCOUNTER — Telehealth: Payer: Self-pay | Admitting: Internal Medicine

## 2018-07-04 DIAGNOSIS — F17200 Nicotine dependence, unspecified, uncomplicated: Secondary | ICD-10-CM | POA: Diagnosis not present

## 2018-07-04 DIAGNOSIS — R05 Cough: Secondary | ICD-10-CM | POA: Diagnosis not present

## 2018-07-04 DIAGNOSIS — R000 Tachycardia, unspecified: Secondary | ICD-10-CM | POA: Diagnosis not present

## 2018-07-04 DIAGNOSIS — R0600 Dyspnea, unspecified: Secondary | ICD-10-CM | POA: Diagnosis not present

## 2018-07-04 NOTE — Telephone Encounter (Signed)
Pt's daughter called stating that pt Is sick and needed to be seen but couldn't get here until 4:30pm. Pt's symptoms are shaky, achy, fever( can't check temp), headache, vomitting, no cough. Symptoms all started this afternoon.   Per vickie and Audelia Acton- try emertol over the counter for vomitting. Take sips of fluid., tylenol. And if that didn't help probably go to urgent care.   Daughter wanted suppository sent in but without being evaluated we could not send anything in. Pt was going to go to [facility]

## 2018-07-08 ENCOUNTER — Encounter: Payer: Self-pay | Admitting: Physician Assistant

## 2018-07-08 ENCOUNTER — Ambulatory Visit: Payer: Medicare HMO | Admitting: Physician Assistant

## 2018-07-08 VITALS — BP 102/69 | HR 98 | Resp 13 | Ht 62.75 in | Wt 102.0 lb

## 2018-07-08 DIAGNOSIS — E1159 Type 2 diabetes mellitus with other circulatory complications: Secondary | ICD-10-CM

## 2018-07-08 DIAGNOSIS — E1169 Type 2 diabetes mellitus with other specified complication: Secondary | ICD-10-CM | POA: Diagnosis not present

## 2018-07-08 DIAGNOSIS — E785 Hyperlipidemia, unspecified: Secondary | ICD-10-CM

## 2018-07-08 DIAGNOSIS — E875 Hyperkalemia: Secondary | ICD-10-CM | POA: Diagnosis not present

## 2018-07-08 DIAGNOSIS — M0600 Rheumatoid arthritis without rheumatoid factor, unspecified site: Secondary | ICD-10-CM

## 2018-07-08 DIAGNOSIS — I152 Hypertension secondary to endocrine disorders: Secondary | ICD-10-CM

## 2018-07-08 DIAGNOSIS — I10 Essential (primary) hypertension: Secondary | ICD-10-CM

## 2018-07-08 DIAGNOSIS — M5030 Other cervical disc degeneration, unspecified cervical region: Secondary | ICD-10-CM

## 2018-07-08 DIAGNOSIS — Z79899 Other long term (current) drug therapy: Secondary | ICD-10-CM | POA: Diagnosis not present

## 2018-07-08 DIAGNOSIS — E559 Vitamin D deficiency, unspecified: Secondary | ICD-10-CM | POA: Diagnosis not present

## 2018-07-08 DIAGNOSIS — D692 Other nonthrombocytopenic purpura: Secondary | ICD-10-CM

## 2018-07-08 DIAGNOSIS — Z8639 Personal history of other endocrine, nutritional and metabolic disease: Secondary | ICD-10-CM

## 2018-07-08 DIAGNOSIS — M8589 Other specified disorders of bone density and structure, multiple sites: Secondary | ICD-10-CM | POA: Diagnosis not present

## 2018-07-08 DIAGNOSIS — Z8719 Personal history of other diseases of the digestive system: Secondary | ICD-10-CM | POA: Diagnosis not present

## 2018-07-08 DIAGNOSIS — Z8711 Personal history of peptic ulcer disease: Secondary | ICD-10-CM

## 2018-07-08 DIAGNOSIS — Z87891 Personal history of nicotine dependence: Secondary | ICD-10-CM

## 2018-07-08 NOTE — Patient Instructions (Signed)
Standing Labs We placed an order today for your standing lab work.    Please come back and get your standing labs in December and every 3 months   We have open lab Monday through Friday from 8:30-11:30 AM and 1:30-4:00 PM  at the office of Dr. Shaili Deveshwar.   You may experience shorter wait times on Monday and Friday afternoons. The office is located at [address], [address], [place], [place] [address] No appointment is necessary.   Labs are drawn by [facility].  You may receive a bill from [facility] for your lab work. If you have any questions regarding directions or hours of operation,  please call [phone].   Just as a reminder please drink plenty of water prior to coming for your lab work. Thanks!   

## 2018-07-11 ENCOUNTER — Encounter: Payer: Self-pay | Admitting: Family Medicine

## 2018-07-11 ENCOUNTER — Ambulatory Visit (INDEPENDENT_AMBULATORY_CARE_PROVIDER_SITE_OTHER): Payer: Medicare HMO | Admitting: Family Medicine

## 2018-07-11 VITALS — BP 102/68 | HR 93 | Temp 97.9°F | Resp 20 | Wt 103.8 lb

## 2018-07-11 DIAGNOSIS — J209 Acute bronchitis, unspecified: Secondary | ICD-10-CM | POA: Diagnosis not present

## 2018-07-11 DIAGNOSIS — M0600 Rheumatoid arthritis without rheumatoid factor, unspecified site: Secondary | ICD-10-CM | POA: Diagnosis not present

## 2018-07-11 DIAGNOSIS — E118 Type 2 diabetes mellitus with unspecified complications: Secondary | ICD-10-CM | POA: Diagnosis not present

## 2018-07-11 MED ORDER — AMOXICILLIN-POT CLAVULANATE 875-125 MG PO TABS: 1.0000 | ORAL_TABLET | Freq: Two times a day (BID) | ORAL | 0 refills | Status: DC

## 2018-07-11 NOTE — Progress Notes (Signed)
   Subjective:    Patient ID: Deborah Travis, female    DOB: [birth year], 77 y.o.   MRN: [id number]  HPI She is here for a follow-up visit.  She was seen in an urgent care center on November 8.  That record was reviewed.  She was given Augmentin, Tessalon Perles, low-dose steroids as well as an inhaler.  No x-ray was taken however the patient states she was told she had pneumonia.  There is also comment about nicotine dependence however she does not smoke.  She also has an underlying history of diabetes as well as being treated for rheumatoid arthritis with a DMARD.  Presently she says that she is 50% better but still using the inhaler 2 puffs 4 times per day.   Review of Systems     Objective:   Physical Exam Alert and in no distress, fatigued appearing. Tympanic membranes and canals are normal. Pharyngeal area is normal. Neck is supple without adenopathy or thyromegaly. Cardiac exam shows a regular sinus rhythm without murmurs or gallops. Lungs are clear to auscultation.        Assessment & Plan:  Acute bronchitis, unspecified organism - Plan: amoxicillin-clavulanate (AUGMENTIN) 875-125 MG tablet  Seronegative rheumatoid arthritis ([place])  Type 2 diabetes mellitus with complication, without long-term current use of insulin ([place]) She is to return here next Thursday.  I will continue her on the Augmentin as she is only 50% better.  She is to also continue to use the inhaler.  She recognizes it because of her underlying medical conditions, recovery can be a slow process.

## 2018-07-16 MED FILL — ENBREL MINI 50 MG/ML SOCT: 50 | 28 days supply | Qty: 4 | Fill #2

## 2018-07-17 ENCOUNTER — Ambulatory Visit (INDEPENDENT_AMBULATORY_CARE_PROVIDER_SITE_OTHER): Payer: Medicare HMO | Admitting: Family Medicine

## 2018-07-17 ENCOUNTER — Ambulatory Visit
Admission: RE | Admit: 2018-07-17 | Discharge: 2018-07-17 | Disposition: A | Discharge status: Home / Self Care | Payer: Medicare HMO | Source: Ambulatory Visit | Attending: Family Medicine | Admitting: Family Medicine

## 2018-07-17 ENCOUNTER — Encounter: Payer: Self-pay | Admitting: Family Medicine

## 2018-07-17 VITALS — BP 114/68 | HR 74 | Temp 98.0°F | Wt 101.8 lb

## 2018-07-17 DIAGNOSIS — M0600 Rheumatoid arthritis without rheumatoid factor, unspecified site: Secondary | ICD-10-CM | POA: Diagnosis not present

## 2018-07-17 DIAGNOSIS — J209 Acute bronchitis, unspecified: Secondary | ICD-10-CM

## 2018-07-17 DIAGNOSIS — R0602 Shortness of breath: Secondary | ICD-10-CM | POA: Diagnosis not present

## 2018-07-17 LAB — COMPREHENSIVE METABOLIC PANEL
ALT: 23 IU/L (ref 0–32)
AST: 24 IU/L (ref 0–40)
Albumin/Globulin Ratio: 1.5 (ref 1.2–2.2)
Albumin: 3.9 g/dL (ref 3.5–4.8)
Alkaline Phosphatase: 110 IU/L (ref 39–117)
BILIRUBIN TOTAL: 0.3 mg/dL (ref 0.0–1.2)
BUN / CREAT RATIO: 8 — AB (ref 12–28)
BUN: 9 mg/dL (ref 8–27)
CHLORIDE: 102 mmol/L (ref 96–106)
CO2: 22 mmol/L (ref 20–29)
Calcium: 8.9 mg/dL (ref 8.7–10.3)
Creatinine, Ser: 1.11 mg/dL — ABNORMAL HIGH (ref 0.57–1.00)
GFR calc Af Amer: 56 mL/min/{1.73_m2} — ABNORMAL LOW (ref >59)
GFR calc non Af Amer: 49 mL/min/{1.73_m2} — ABNORMAL LOW (ref >59)
GLUCOSE: 114 mg/dL — AB (ref 65–99)
Globulin, Total: 2.6 g/dL (ref 1.5–4.5)
Potassium: 5.8 mmol/L — ABNORMAL HIGH (ref 3.5–5.2)
Sodium: 133 mmol/L — ABNORMAL LOW (ref 134–144)
Total Protein: 6.5 g/dL (ref 6.0–8.5)

## 2018-07-17 LAB — CBC WITH DIFFERENTIAL/PLATELET
BASOS: 0 %
Basophils Absolute: 0 10*3/uL (ref 0.0–0.2)
EOS (ABSOLUTE): 0.2 10*3/uL (ref 0.0–0.4)
EOS: 2 %
Hematocrit: 38 % (ref 34.0–46.6)
Hemoglobin: 12.6 g/dL (ref 11.1–15.9)
LYMPHS ABS: 2.4 10*3/uL (ref 0.7–3.1)
Lymphs: 28 %
MCH: 29.4 pg (ref 26.6–33.0)
MCHC: 33.2 g/dL (ref 31.5–35.7)
MCV: 89 fL (ref 79–97)
MONOS ABS: 1 10*3/uL — AB (ref 0.1–0.9)
Monocytes: 11 %
Neutrophils Absolute: 5.1 10*3/uL (ref 1.4–7.0)
Neutrophils: 59 %
PLATELETS: 375 10*3/uL (ref 150–450)
RBC: 4.28 x10E6/uL (ref 3.77–5.28)
RDW: 14.3 % (ref 12.3–15.4)
WBC: 8.7 10*3/uL (ref 3.4–10.8)

## 2018-07-17 MED ORDER — SULFAMETHOXAZOLE-TRIMETHOPRIM 800-160 MG PO TABS: 1.0000 | ORAL_TABLET | Freq: Two times a day (BID) | ORAL | 0 refills | Status: DC

## 2018-07-17 MED ORDER — PREDNISONE 10 MG (48) PO TBPK: ORAL_TABLET | ORAL | 0 refills | Status: DC

## 2018-07-17 NOTE — Progress Notes (Signed)
   Subjective:    Patient ID: Deborah Travis, female    DOB: Sep 27, 1940, 77 y.o.   MRN: [id number]  HPI She is here for recheck.  She states that the productive cough has not improved.  No fever or chills but definitely cough, congestion, malaise and fatigue.  She was treated in an urgent care center as mentioned in the last note.   Review of Systems     Objective:   Physical Exam Alert and in no distress but slightly toxic appearing and does smell of kerosene.. Tympanic membranes and canals are normal. Pharyngeal area is normal. Neck is supple without adenopathy or thyromegaly. Cardiac exam shows a regular sinus rhythm without murmurs or gallops. Lungs are clear to auscultation. X-rays and blood work were negative.  Potassium is slightly high.       Assessment & Plan:  Acute bronchitis, unspecified organism - Plan: DG Chest 2 View, CBC with Differential/Platelet, Comprehensive metabolic panel, predniSONE (STERAPRED UNI-PAK 48 TAB) 10 MG (48) TBPK tablet, sulfamethoxazole-trimethoprim (BACTRIM DS,SEPTRA DS) 800-160 MG tablet  Seronegative rheumatoid arthritis ([place]) She is to increase her fluid intake, take the medications.  She will also monitor her blood sugars and if any problems, call us for possible low-dose insulin.  Otherwise recheck here in about 2 weeks

## 2018-07-18 ENCOUNTER — Telehealth: Payer: Self-pay

## 2018-07-18 NOTE — Telephone Encounter (Signed)
Advised pt per Dr. Redmond School to stop amoxicilln and to take the Bactrim.[place]

## 2018-07-22 ENCOUNTER — Inpatient Hospital Stay: Payer: Medicare HMO | Attending: Hematology

## 2018-07-22 DIAGNOSIS — D472 Monoclonal gammopathy: Secondary | ICD-10-CM

## 2018-07-22 DIAGNOSIS — R771 Abnormality of globulin: Secondary | ICD-10-CM | POA: Diagnosis not present

## 2018-07-22 LAB — CBC WITH DIFFERENTIAL/PLATELET
ABS IMMATURE GRANULOCYTES: 0.1 10*3/uL — AB (ref 0.00–0.07)
BASOS ABS: 0 10*3/uL (ref 0.0–0.1)
Basophils Relative: 0 %
Eosinophils Absolute: 0 10*3/uL (ref 0.0–0.5)
Eosinophils Relative: 0 %
HEMATOCRIT: 40.1 % (ref 36.0–46.0)
HEMOGLOBIN: 13 g/dL (ref 12.0–15.0)
Immature Granulocytes: 1 %
LYMPHS ABS: 1.1 10*3/uL (ref 0.7–4.0)
LYMPHS PCT: 12 %
MCH: 29.8 pg (ref 26.0–34.0)
MCHC: 32.4 g/dL (ref 30.0–36.0)
MCV: 92 fL (ref 80.0–100.0)
Monocytes Absolute: 0.7 10*3/uL (ref 0.1–1.0)
Monocytes Relative: 8 %
NEUTROS ABS: 7.2 10*3/uL (ref 1.7–7.7)
NEUTROS PCT: 79 %
NRBC: 0 % (ref 0.0–0.2)
Platelets: 347 10*3/uL (ref 150–400)
RBC: 4.36 MIL/uL (ref 3.87–5.11)
RDW: 15.3 % (ref 11.5–15.5)
WBC: 9 10*3/uL (ref 4.0–10.5)

## 2018-07-22 LAB — CMP (CANCER CENTER ONLY)
ALBUMIN: 4.1 g/dL (ref 3.5–5.0)
ALK PHOS: 93 U/L (ref 38–126)
ALT: 24 U/L (ref 0–44)
ANION GAP: 14 (ref 5–15)
AST: 18 U/L (ref 15–41)
BUN: 32 mg/dL — ABNORMAL HIGH (ref 8–23)
CALCIUM: 9.6 mg/dL (ref 8.9–10.3)
CO2: 16 mmol/L — AB (ref 22–32)
Chloride: 102 mmol/L (ref 98–111)
Creatinine: 1.6 mg/dL — ABNORMAL HIGH (ref 0.44–1.00)
GFR, EST AFRICAN AMERICAN: 36 mL/min — AB (ref >60)
GFR, Estimated: 31 mL/min — ABNORMAL LOW (ref >60)
GLUCOSE: 209 mg/dL — AB (ref 70–99)
POTASSIUM: 4.7 mmol/L (ref 3.5–5.1)
SODIUM: 132 mmol/L — AB (ref 135–145)
TOTAL PROTEIN: 7.8 g/dL (ref 6.5–8.1)
Total Bilirubin: 0.4 mg/dL (ref 0.3–1.2)

## 2018-07-22 LAB — SEDIMENTATION RATE: SED RATE: 4 mm/h (ref 0–22)

## 2018-07-22 LAB — RETICULOCYTES
Immature Retic Fract: 7.9 % (ref 2.3–15.9)
RBC.: 4.36 MIL/uL (ref 3.87–5.11)
RETIC CT PCT: 2 % (ref 0.4–3.1)
Retic Count, Absolute: 85 10*3/uL (ref 19.0–186.0)

## 2018-07-23 LAB — KAPPA/LAMBDA LIGHT CHAINS
KAPPA FREE LGHT CHN: 24.5 mg/L — AB (ref 3.3–19.4)
KAPPA, LAMDA LIGHT CHAIN RATIO: 1.3 (ref 0.26–1.65)
Lambda free light chains: 18.9 mg/L (ref 5.7–26.3)

## 2018-07-28 NOTE — Progress Notes (Signed)
HEMATOLOGY/ONCOLOGY CLINIC NOTE  Date of Service: 07/29/2018    Patient Care Team: [name], MD as PCP - General (Family Medicine)  CHIEF COMPLAINTS/PURPOSE OF CONSULTATION:  Abnormal IFE   HISTORY OF PRESENTING ILLNESS:   Deborah Travis is a wonderful 76 y.o. female who has been referred to [place] by Dr. Jill Alexanders for evaluation and management of Abnormal IFE. She is accompanied today by her daughter. The pt reports that she is doing well overall.   The pt reports that she had a stiff neck a couple months ago, and began noticing worsening joint pain in her knees, shoulders, wrists and fingers in the last few weeks.  She notes that her joints swelled and that her morning stiffness lasts 2-3 hours. She was referred to rheumatologist t Dr. [name] in the last month who diagnosed her with seronegative rheumatoid arthritis. She has been taking a Prednisone taper and started 78m Arava recently, and notes that she is just beginning to experience some relief from this joint pain.   Most recent lab results (02/19/18) of IgG, IgA, IgM is as follows: all values are WNL except for IgM at 26. CBC w/diff on 03/18/18 showed all values WNL.   On review of systems, pt reports stiff/swollen joints that are improving, resolved neck pain, good energy levels and denies abdominal pains, fevers, chills, and any other symptoms.   On PMHx the pt reports Seronegative rheumatoid arthritis taking prednisone and Arava.   Interval History:   Deborah GRANGEreturns today for management and evaluation of her MGUS. The patient's last visit with [place] on 03/20/18. She is accompanied today by her daughter. The pt reports that she is doing well overall.   The pt reports that she is continuing follow up with Rheumatology. She had pneumonia about a month ago. The pt notes that she finished a two week course of Bactrim last night, and previously completed Augmentin course as well. The pt endorses pain in her  sternum which she associates with coughing, and is continuing to cough up phlegm. She also endorses lower pelvic pain, on left side, for the past 1-2 weeks intermittently. She notes that she has had recent diarrhea, 3-4 times a day, but this is improving.  The pt notes that she has lost some weight recently, after recent infections, and taking Arava.   Lab results (07/22/18) of CBC w/diff, Reticulocytes, and CMP is as follows: all values are WNL except for Sodium at 132, CO2 at 16, Glucose at 209, BUN at 32, Creatinine at 1.60, GFR at 36. 07/22/18 SFLC revealed Kappa slightly elevated at 24.5, normal Lambda at 18.9, and normal K:L ratio at 1.30 07/22/18 Sed Rate at 4 07/22/18 MMP is pending   On review of systems, pt reports sternum pain, productive cough slowly improving, pelvic pain, recent diarrhea, and denies any other symptoms.   MEDICAL HISTORY:  Past Medical History:  Diagnosis Date  . Chronic kidney disease    RENAL STONE  . Chronic pancreatitis ([place]   . Diabetes mellitus   . Duodenal ulcer   . Dyslipidemia   . GERD (gastroesophageal reflux disease)   . Hypertension   . Osteopenia   . Thyroid disease    HYPOTHYROID  . Vitamin D deficiency     SURGICAL HISTORY: Past Surgical History:  Procedure Laterality Date  . APPENDECTOMY    . CATARACT EXTRACTION Right 2014  . CHOLECYSTECTOMY    . TONSILECTOMY, ADENOIDECTOMY, BILATERAL MYRINGOTOMY AND TUBES    .  WHIPPLE PROCEDURE  1998    SOCIAL HISTORY: Social History   Socioeconomic History  . Marital status: Single    Spouse name: Not on file  . Number of children: Not on file  . Years of education: Not on file  . Highest education level: Not on file  Occupational History  . Not on file  Social Needs  . Financial resource strain: Not on file  . Food insecurity:    Worry: Not on file    Inability: Not on file  . Transportation needs:    Medical: Not on file    Non-medical: Not on file  Tobacco Use  . Smoking  status: Former Smoker    Packs/day: 0.10    Last attempt to quit: 10/31/2015    Years since quitting: 2.7  . Smokeless tobacco: Never Used  . Tobacco comment: 3 cigarettes a day  Substance and Sexual Activity  . Alcohol use: No    Alcohol/week: 0.0 standard drinks  . Drug use: No  . Sexual activity: Not Currently  Lifestyle  . Physical activity:    Days per week: Not on file    Minutes per session: Not on file  . Stress: Not on file  Relationships  . Social connections:    Talks on phone: Not on file    Gets together: Not on file    Attends religious service: Not on file    Active member of club or organization: Not on file    Attends meetings of clubs or organizations: Not on file    Relationship status: Not on file  . Intimate partner violence:    Fear of current or ex partner: Not on file    Emotionally abused: Not on file    Physically abused: Not on file    Forced sexual activity: Not on file  Other Topics Concern  . Not on file  Social History Narrative  . Not on file    FAMILY HISTORY: Family History  Problem Relation Age of Onset  . Stroke Mother   . Heart disease Father   . Healthy Daughter   . Healthy Daughter     ALLERGIES:  is allergic to levofloxacin; codeine; and morphine and related.  MEDICATIONS:  Current Outpatient Medications  Medication Sig Dispense Refill  . ACCU-CHEK FASTCLIX LANCETS MISC 1 Device by Does not apply route 2 (two) times daily. 15 each 3  . Alcohol Swabs (B-D SINGLE USE SWABS BUTTERFLY) PADS 1 each by Does not apply route 2 (two) times daily. 300 each 3  . amoxicillin-clavulanate (AUGMENTIN) 875-125 MG tablet Take 1 tablet by mouth 2 (two) times daily. 14 tablet 0  . b complex vitamins capsule Take 1 capsule by mouth daily.    . Blood Glucose Calibration (ACCU-CHEK AVIVA) SOLN Use as directed 3 each 0  . Blood Glucose Monitoring Suppl (ACCU-CHEK AVIVA PLUS) w/Device KIT Use as directed Patient is to test BID DX E11.9 1 kit 0  .  CALCIUM PO Take by mouth daily.    . ENBREL MINI 50 MG/ML SOCT INJECT 50 MG INTO THE SKIN ONCE A WEEK. (Patient not taking: Reported on 07/11/2018) 11.76 mL 0  . glucose blood test strip THIS IS FOR Accucheck Aviva Plus Use as instructed PATIENT IS TO TEST ONE TIME A DAY DX: E11.9 100 each 3  . leflunomide (ARAVA) 20 MG tablet TAKE 1 TABLET (20 MG TOTAL) BY MOUTH DAILY. 90 tablet 0  . levothyroxine (SYNTHROID, LEVOTHROID) 100 MCG tablet Take 1  tablet (100 mcg total) by mouth daily. 90 tablet 3  . lisinopril (PRINIVIL,ZESTRIL) 10 MG tablet Take 1 tablet (10 mg total) by mouth daily. (Patient taking differently: Take 10 mg by mouth daily. ) 90 tablet 3  . metFORMIN (GLUCOPHAGE) 1000 MG tablet One twice a day 180 tablet 1  . methylPREDNISolone (MEDROL DOSEPAK) 4 MG TBPK tablet Take by mouth.    . Multiple Vitamin (MULITIVITAMIN WITH MINERALS) TABS Take 1 tablet by mouth daily.    Marland Kitchen nystatin (MYCOSTATIN) 100000 UNIT/ML suspension Take 5 mLs (500,000 Units total) by mouth 4 (four) times daily for 10 days. 200 mL 0  . pravastatin (PRAVACHOL) 20 MG tablet Take 1 tablet (20 mg total) by mouth daily. 90 tablet 3  . predniSONE (STERAPRED UNI-PAK 48 TAB) 10 MG (48) TBPK tablet Use as per manufacturer's recommendations 48 tablet 0  . sulfamethoxazole-trimethoprim (BACTRIM DS,SEPTRA DS) 800-160 MG tablet Take 1 tablet by mouth 2 (two) times daily. 20 tablet 0   No current facility-administered medications for this visit.     REVIEW OF SYSTEMS:    A 10+ POINT REVIEW OF SYSTEMS WAS OBTAINED including neurology, dermatology, psychiatry, cardiac, respiratory, lymph, extremities, GI, GU, Musculoskeletal, constitutional, breasts, reproductive, HEENT.  All pertinent positives are noted in the HPI.  All others are negative.   PHYSICAL EXAMINATION:  . Vitals:   07/29/18 0859  BP: 100/73  Pulse: 88  Resp: 18  Temp: 98 F (36.7 C)  SpO2: 100%   Filed Weights   07/29/18 0859  Weight: 101 lb 8 oz (46 kg)    .Body mass index is 18.12 kg/m.  GENERAL:alert, in no acute distress and comfortable SKIN: no acute rashes, no significant lesions EYES: conjunctiva are pink and non-injected, sclera anicteric OROPHARYNX: MMM, thrush NECK: supple, no JVD LYMPH:  no palpable lymphadenopathy in the cervical, axillary or inguinal regions LUNGS: clear to auscultation b/l with normal respiratory effort HEART: regular rate & rhythm ABDOMEN:  normoactive bowel sounds , tender over LLQ, not distended. No palpable hepatosplenomegaly.  Extremity: no pedal edema PSYCH: alert & oriented x 3 with fluent speech NEURO: no focal motor/sensory deficits   LABORATORY DATA:  I have reviewed the data as listed  . CBC Latest Ref Rng & Units 07/22/2018 07/17/2018 05/15/2018  WBC 4.0 - 10.5 K/uL 9.0 8.7 7.4  Hemoglobin 12.0 - 15.0 g/dL 13.0 12.6 12.4  Hematocrit 36.0 - 46.0 % 40.1 38.0 38.3  Platelets 150 - 400 K/uL 347 375 276    . CMP Latest Ref Rng & Units 07/22/2018 07/17/2018 05/19/2018  Glucose 70 - 99 mg/dL 209(H) 114(H) 108(H)  BUN 8 - 23 mg/dL 32(H) 9 12  Creatinine 0.44 - 1.00 mg/dL 1.60(H) 1.11(H) 1.01(H)  Sodium 135 - 145 mmol/L 132(L) 133(L) 138  Potassium 3.5 - 5.1 mmol/L 4.7 5.8(H) 5.2  Chloride 98 - 111 mmol/L 102 102 107(H)  CO2 22 - 32 mmol/L 16(L) 22 21  Calcium 8.9 - 10.3 mg/dL 9.6 8.9 8.8  Total Protein 6.5 - 8.1 g/dL 7.8 6.5 -  Total Bilirubin 0.3 - 1.2 mg/dL 0.4 0.3 -  Alkaline Phos 38 - 126 U/L 93 110 -  AST 15 - 41 U/L 18 24 -  ALT 0 - 44 U/L 24 23 -     Component     Latest Ref Rng & Units 02/19/2018  Hepatitis C Ab     NON-REACTI NON-REACTIVE  SIGNAL TO CUT-OFF     <1.00 0.01  Hep B Core Ab, IgM  NON-REACTI NON-REACTIVE  Hepatitis B Surface Ag     NON-REACTI NON-REACTIVE  HIV     NON-REACTI NON-REACTIVE       RADIOGRAPHIC STUDIES: I have personally reviewed the radiological images as listed and agreed with the findings in the report. Dg Chest 2 View  Result  Date: 07/17/2018 CLINICAL DATA:  Shortness of breath. EXAM: CHEST - 2 VIEW COMPARISON:  Radiographs of November 08, 2015. FINDINGS: The heart size and mediastinal contours are within normal limits. Both lungs are clear. No pneumothorax or pleural effusion is noted. Hyperexpansion of the lungs is noted. The visualized skeletal structures are unremarkable. IMPRESSION: No active cardiopulmonary disease. Electronically Signed   By: Marijo Conception, M.D.   On: 07/17/2018 09:10    ASSESSMENT & PLAN:  77 y.o. female with  1. Abnormal IFE with abnormal band in gammaglobin fraction -- likely related to significant inflammation from recently  Diagnosed seronegative RA  2. Thrush - oral PLAN -Labs upon initial presentation from 02/19/18, IgM at 26. CBC w/diff showed all values WNL except for HGB at 12.2, PLT at 327k.  -Discussed pt labwork from 07/22/18; HGB normal at 13.0, PLT normal at 347k, WBC normal at 9.0k. BUN elevated at 32, Creatinine elevated at 1.60 in the context of Bactrim. Kappa slightly elevated at 24.5, K:L ratio normal at 1.30 -No clonal pattern indicated by [facility]  -07/22/18 MMP is no M spile.  -Patient will follow up with her PCP tomorrow, and I encouraged her to discuss her Creatinine bump with her PCP -Encouraged the patient to consume at least 64 oz of water today  -Ordering Nystatin mouthwash for thrush  -Advised that the patient take probiotics as she has been on antibiotics for a month, has recently had diarrhea, and has thrush -Could have an element of C.diff colitis, and recommend PCP pursue stool study -Discussed that globulins can become elevated in response to inflammatory responses like her RA -continue f/u with rheumatology to optimize rx for RA -Will see the pt back in one year, sooner if any new concerns, if all remains stable, will discharge pt back to her PCP    RTC with Dr Irene Limbo with labs in 12 months   All of the patients questions were answered with apparent  satisfaction. The patient knows to call the clinic with any problems, questions or concerns.  The total time spent in the appt was 25 minutes and more than 50% was on counseling and direct patient cares.    Sullivan Lone MD MS AAHIVMS [facility] Hematology/Oncology Physician [facility]  (Office):       [phone] (Work cell):  [phone] (Fax):           [phone]  07/29/2018 9:35 AM  I, Baldwin [place], am acting as a scribe for Dr. Sullivan Lone.   .I have reviewed the above documentation for accuracy and completeness, and I agree with the above. Brunetta Genera MD

## 2018-07-29 ENCOUNTER — Inpatient Hospital Stay: Payer: Medicare HMO | Attending: Hematology | Admitting: Hematology

## 2018-07-29 VITALS — BP 100/73 | HR 88 | Temp 98.0°F | Resp 18 | Ht 62.75 in | Wt 101.5 lb

## 2018-07-29 DIAGNOSIS — R197 Diarrhea, unspecified: Secondary | ICD-10-CM | POA: Diagnosis not present

## 2018-07-29 DIAGNOSIS — R771 Abnormality of globulin: Secondary | ICD-10-CM | POA: Diagnosis not present

## 2018-07-29 DIAGNOSIS — B370 Candidal stomatitis: Secondary | ICD-10-CM | POA: Insufficient documentation

## 2018-07-29 DIAGNOSIS — Z87891 Personal history of nicotine dependence: Secondary | ICD-10-CM | POA: Diagnosis not present

## 2018-07-29 DIAGNOSIS — R7989 Other specified abnormal findings of blood chemistry: Secondary | ICD-10-CM

## 2018-07-29 DIAGNOSIS — M0600 Rheumatoid arthritis without rheumatoid factor, unspecified site: Secondary | ICD-10-CM | POA: Diagnosis not present

## 2018-07-29 DIAGNOSIS — D472 Monoclonal gammopathy: Secondary | ICD-10-CM

## 2018-07-29 LAB — MULTIPLE MYELOMA PANEL, SERUM
ALBUMIN SERPL ELPH-MCNC: 3.7 g/dL (ref 2.9–4.4)
Albumin/Glob SerPl: 1.2 (ref 0.7–1.7)
Alpha 1: 0.3 g/dL (ref 0.0–0.4)
Alpha2 Glob SerPl Elph-Mcnc: 0.9 g/dL (ref 0.4–1.0)
B-GLOBULIN SERPL ELPH-MCNC: 1.2 g/dL (ref 0.7–1.3)
GAMMA GLOB SERPL ELPH-MCNC: 0.8 g/dL (ref 0.4–1.8)
Globulin, Total: 3.2 g/dL (ref 2.2–3.9)
IGG (IMMUNOGLOBIN G), SERUM: 994 mg/dL (ref 700–1600)
IgA: 440 mg/dL — ABNORMAL HIGH (ref 64–422)
IgM (Immunoglobulin M), Srm: 43 mg/dL (ref 26–217)
Total Protein ELP: 6.9 g/dL (ref 6.0–8.5)

## 2018-07-29 MED ORDER — NYSTATIN 100000 UNIT/ML MT SUSP: 5.0000 mL | Freq: Four times a day (QID) | OROMUCOSAL | 0 refills | Status: AC

## 2018-07-30 ENCOUNTER — Encounter: Payer: Self-pay | Admitting: Family Medicine

## 2018-07-30 ENCOUNTER — Ambulatory Visit (INDEPENDENT_AMBULATORY_CARE_PROVIDER_SITE_OTHER): Payer: Medicare HMO | Admitting: Family Medicine

## 2018-07-30 VITALS — BP 116/68 | HR 72 | Temp 98.1°F | Wt 102.8 lb

## 2018-07-30 DIAGNOSIS — R1032 Left lower quadrant pain: Secondary | ICD-10-CM

## 2018-07-30 DIAGNOSIS — R430 Anosmia: Secondary | ICD-10-CM

## 2018-07-30 DIAGNOSIS — J209 Acute bronchitis, unspecified: Secondary | ICD-10-CM | POA: Diagnosis not present

## 2018-07-30 NOTE — Progress Notes (Signed)
   Subjective:    Patient ID: Deborah Travis, female    DOB: 1940/12/07, 77 y.o.   MRN: [id number]  HPI She is here for a recheck.  She states that she is only 50% better still having difficulty with a cough.  She was recently diagnosed with thrush and given nystatin.  She is stated that she has had continued difficulty with decreased sense of taste and smell.  This is been going on roughly the same timeframe.  She then mentioned that she has had some intermittent abdominal pain especially in the left lower quadrant dating back to roughly the same time but cannot necessarily related to the cough and congestion.  She states she has some nausea but blames that on her inability to taste.  She has had no vomiting diarrhea seen blood or pus in her stool.  No fever or chills.  She was placed on Augmentin which she went to an urgent care center.  I then treated her with Septra and steroids and she states that she is only 50% better.  She does have an allergy to quinolones. Recent x-ray was negative.   Review of Systems     Objective:   Physical Exam Alert and in no distress. Tympanic membranes and canals are normal. Pharyngeal area is normal. Neck is supple without adenopathy or thyromegaly. Cardiac exam shows a regular sinus rhythm without murmurs or gallops. Lungs are clear to auscultation. Abdominal exam shows active bowel sounds with tenderness palpation in the left lower quadrant but no rebound.       Assessment & Plan:  Left lower quadrant abdominal pain - Plan: CT Abdomen Pelvis W Contrast  Acute bronchitis, unspecified organism  Anosmia Her pulmonary and taste symptoms could all be interrelated with also a component of sinus infection.  With her left lower quadrant pain, we need to evaluate for possible diverticulitis.  Prefer to wait on the results to come in before deciding upon the proper medications.

## 2018-07-31 ENCOUNTER — Telehealth: Payer: Self-pay | Admitting: Hematology

## 2018-07-31 ENCOUNTER — Telehealth: Payer: Self-pay

## 2018-07-31 NOTE — Telephone Encounter (Signed)
Called pt to advise per JCL she may need to go to the ER due to symptoms  getting worse. Pt was advised. [place]

## 2018-07-31 NOTE — Telephone Encounter (Signed)
Patient called and stated a ct [place] an was set up for her in she is in quite a bit of pain now in her pelvic area and under her breast. She wants to know if something can be sent to the pharmacy for her pain. Please advise.

## 2018-07-31 NOTE — Telephone Encounter (Signed)
Patient was informed.

## 2018-07-31 NOTE — Telephone Encounter (Signed)
For pain is worse she probably should go to the emergency room

## 2018-07-31 NOTE — Telephone Encounter (Signed)
Pt called and say she is in a lot of pain and unable to keep anything down. Pt would like to know if you can call in something for her . Pt has CT scan ordered but has not been contacted as of yet as to when she will have this done. Please advise. [place]

## 2018-07-31 NOTE — Telephone Encounter (Signed)
Spoke with patient and verified appointments for Dec 2020.  Printed and mailed calendar.

## 2018-08-04 ENCOUNTER — Telehealth: Payer: Self-pay

## 2018-08-04 NOTE — Telephone Encounter (Signed)
Pt was called to see if she went to the er as advised by [employer]. No answer lvm for pt to call back. [place]

## 2018-08-04 NOTE — Telephone Encounter (Signed)
Message has been responded to

## 2018-08-07 ENCOUNTER — Encounter: Payer: Self-pay | Admitting: Radiology

## 2018-08-07 ENCOUNTER — Ambulatory Visit
Admission: RE | Admit: 2018-08-07 | Discharge: 2018-08-07 | Disposition: A | Discharge status: Home / Self Care | Payer: Medicare HMO | Source: Ambulatory Visit | Attending: Family Medicine | Admitting: Family Medicine

## 2018-08-07 DIAGNOSIS — R109 Unspecified abdominal pain: Secondary | ICD-10-CM | POA: Diagnosis not present

## 2018-08-07 MED ORDER — IOHEXOL 300 MG/ML  SOLN
50.0000 mL | Freq: Once | INTRAMUSCULAR | Status: AC | PRN
  Administered 2018-08-07: 50 mL via INTRAVENOUS

## 2018-08-08 DIAGNOSIS — I7090 Unspecified atherosclerosis: Secondary | ICD-10-CM | POA: Insufficient documentation

## 2018-08-12 ENCOUNTER — Encounter: Payer: Self-pay | Admitting: Family Medicine

## 2018-08-12 ENCOUNTER — Ambulatory Visit (INDEPENDENT_AMBULATORY_CARE_PROVIDER_SITE_OTHER): Payer: Medicare HMO | Admitting: Family Medicine

## 2018-08-12 VITALS — BP 102/68 | HR 79 | Temp 98.1°F | Wt 102.0 lb

## 2018-08-12 DIAGNOSIS — E1159 Type 2 diabetes mellitus with other circulatory complications: Secondary | ICD-10-CM | POA: Diagnosis not present

## 2018-08-12 DIAGNOSIS — E118 Type 2 diabetes mellitus with unspecified complications: Secondary | ICD-10-CM | POA: Diagnosis not present

## 2018-08-12 DIAGNOSIS — I10 Essential (primary) hypertension: Secondary | ICD-10-CM | POA: Diagnosis not present

## 2018-08-12 DIAGNOSIS — Z86010 Personal history of colonic polyps: Secondary | ICD-10-CM

## 2018-08-12 DIAGNOSIS — M0600 Rheumatoid arthritis without rheumatoid factor, unspecified site: Secondary | ICD-10-CM

## 2018-08-12 DIAGNOSIS — E039 Hypothyroidism, unspecified: Secondary | ICD-10-CM | POA: Diagnosis not present

## 2018-08-12 DIAGNOSIS — F17200 Nicotine dependence, unspecified, uncomplicated: Secondary | ICD-10-CM

## 2018-08-12 DIAGNOSIS — E1169 Type 2 diabetes mellitus with other specified complication: Secondary | ICD-10-CM

## 2018-08-12 DIAGNOSIS — I152 Hypertension secondary to endocrine disorders: Secondary | ICD-10-CM

## 2018-08-12 DIAGNOSIS — E785 Hyperlipidemia, unspecified: Secondary | ICD-10-CM | POA: Diagnosis not present

## 2018-08-12 LAB — POCT GLYCOSYLATED HEMOGLOBIN (HGB A1C): Hemoglobin A1C: 7.3 % — AB (ref 4.0–5.6)

## 2018-08-12 NOTE — Progress Notes (Signed)
  Subjective:    Patient ID: [name], female    DOB: [birth year], 77 y.o.   MRN: [id number]  Deborah Travis is a 77 y.o. female who presents for follow-up of Type 2 diabetes mellitus.  Patient is checking home blood sugars.   Home blood sugar records: meter records How often is blood sugars being checked: QD 2 hours post meal 150-220 pt has been on steroids but since than blood sugars have came back down to 150 Current symptoms/problems include elevated readings and have been since taking steroid for treatment. Daily foot checks: yes   Any foot concerns: none Last eye exam: 03/2018 Exercise: walking  She continues to be followed by rheumatology.  She is taking [place].  Continues on her thyroid medication as well as on lisinopril and pravastatin.  She is now smoking roughly 3 cigarettes/day The following portions of the patient's history were reviewed and updated as appropriate: allergies, current medications, past medical history, past social history and problem list.  ROS as in subjective above.     Objective:    Physical Exam Alert and in no distress otherwise not examined.   Lab Review Diabetic Labs Latest Ref Rng & Units 07/22/2018 07/17/2018 05/19/2018 05/15/2018 04/03/2018  HbA1c - - - - - -  Microalbumin mg/L - - - - -  Micro/Creat Ratio - - - - - -  Chol 100 - 199 mg/dL - - - - -  HDL >39 mg/dL - - - - -  Calc LDL 0 - 99 mg/dL - - - - -  Triglycerides 0 - 149 mg/dL - - - - -  Creatinine 0.44 - 1.00 mg/dL 1.60(H) 1.11(H) 1.01(H) 1.17(H) 1.15(H)   BP/Weight 07/30/2018 07/29/2018 07/17/2018 07/11/2018 30/86/5784  Systolic BP 696 295 284 132 440  Diastolic BP 68 73 68 68 69  Wt. (Lbs) 102.8 101.5 101.8 103.8 102  BMI 18.36 18.12 18.18 18.53 18.21   Foot/eye exam completion dates Latest Ref Rng & Units 05/20/2018 12/24/2017  Eye Exam No Retinopathy No Retinopathy -  Foot Form Completion - - Done  A1c is 7.3  [name]  reports that she quit smoking about 2 years ago. She  smoked 0.10 packs per day. She has never used smokeless tobacco. She reports that she does not drink alcohol or use drugs.     Assessment & Plan:    Type 2 diabetes mellitus with complication, without long-term current use of insulin (HCC) - Plan: POCT glycosylated hemoglobin (Hb A1C)  Seronegative rheumatoid arthritis (HCC)  Hypothyroidism, unspecified type  Current smoker on some days  Hyperlipidemia associated with type 2 diabetes mellitus ([place])  Hypertension associated with diabetes ([place])  Type 2 diabetes with complication ([place])  History of colonic polyps - Plan: Ambulatory referral to Gastroenterology   1. Rx changes: none   2. Education: Reviewed 'ABCs' of diabetes management (respective goals in parentheses):  A1C (<7), blood pressure (<130/80), and cholesterol (LDL <100). 3. Compliance at present is estimated to be fair. Efforts to improve compliance (if necessary) will be directed at No change. 4. Follow up: 4 months I think the A1c is most likely related to the steroids that she has had.

## 2018-09-04 DIAGNOSIS — R197 Diarrhea, unspecified: Secondary | ICD-10-CM | POA: Diagnosis not present

## 2018-09-05 DIAGNOSIS — R197 Diarrhea, unspecified: Secondary | ICD-10-CM | POA: Diagnosis not present

## 2018-09-08 ENCOUNTER — Telehealth: Payer: Self-pay | Admitting: Pharmacist

## 2018-09-08 NOTE — Telephone Encounter (Signed)
Thank you for informing me.

## 2018-09-08 NOTE — Telephone Encounter (Signed)
Received call from pharmacy stating patient is no longer taking Enbrel.  Called to confirm.  At last office visit in November patient had pneumonia and was on antibiotics.  She was advised to hold the medication until she finished her antibiotics and symptoms improved.  Patient decided not to restart.  She states she feels fine and denies any flares except for her left wrist which is red,swollen, and weak.  She continues to take [place].  Advised patient to she was just to hold the medication and she may resume therapy. She does not want to restart at this time as she is concerned about recurrent infections.  Will follow up with providers.  Instructed patient to schedule earlier follow up if her symptoms worsen.  All questions encouraged and answered.  Instructed patient to call with any further questions or concerns.  Mariella Saa, PharmD, [facility] Rheumatology Clinical Pharmacist  09/08/2018 2:31 PM

## 2018-10-01 DIAGNOSIS — R197 Diarrhea, unspecified: Secondary | ICD-10-CM | POA: Diagnosis not present

## 2018-10-01 LAB — HM COLONOSCOPY

## 2018-10-03 DIAGNOSIS — R197 Diarrhea, unspecified: Secondary | ICD-10-CM | POA: Diagnosis not present

## 2018-10-13 ENCOUNTER — Telehealth: Payer: Self-pay | Admitting: Rheumatology

## 2018-10-13 MED ORDER — LEFLUNOMIDE 20 MG PO TABS: 20.0000 mg | ORAL_TABLET | Freq: Every day | ORAL | 0 refills | Status: DC

## 2018-10-13 NOTE — Telephone Encounter (Signed)
Last visit: 07/08/18 Next Visit: 12/10/18 Labs: 07/22/18 Creat 1.60 GFR 31 Elevated glucose BUN 32   Okay to refill per Dr. Estanislado Pandy

## 2018-10-13 NOTE — Telephone Encounter (Signed)
Patient left a voicemail requesting prescription refill of Leflunomide to be sent to [facility].  Patient states she has one week left of her medication.

## 2018-10-28 DIAGNOSIS — R197 Diarrhea, unspecified: Secondary | ICD-10-CM | POA: Diagnosis not present

## 2018-11-17 ENCOUNTER — Encounter: Payer: Self-pay | Admitting: Family Medicine

## 2018-11-17 ENCOUNTER — Other Ambulatory Visit: Payer: Self-pay

## 2018-11-17 ENCOUNTER — Ambulatory Visit (INDEPENDENT_AMBULATORY_CARE_PROVIDER_SITE_OTHER): Payer: Medicare HMO | Admitting: Family Medicine

## 2018-11-17 VITALS — BP 112/72 | HR 74 | Temp 98.0°F | Ht 62.75 in | Wt 110.6 lb

## 2018-11-17 DIAGNOSIS — Z87891 Personal history of nicotine dependence: Secondary | ICD-10-CM

## 2018-11-17 DIAGNOSIS — E1159 Type 2 diabetes mellitus with other circulatory complications: Secondary | ICD-10-CM

## 2018-11-17 DIAGNOSIS — E785 Hyperlipidemia, unspecified: Secondary | ICD-10-CM

## 2018-11-17 DIAGNOSIS — H409 Unspecified glaucoma: Secondary | ICD-10-CM

## 2018-11-17 DIAGNOSIS — E1169 Type 2 diabetes mellitus with other specified complication: Secondary | ICD-10-CM | POA: Diagnosis not present

## 2018-11-17 DIAGNOSIS — K8681 Exocrine pancreatic insufficiency: Secondary | ICD-10-CM

## 2018-11-17 DIAGNOSIS — E118 Type 2 diabetes mellitus with unspecified complications: Secondary | ICD-10-CM | POA: Diagnosis not present

## 2018-11-17 DIAGNOSIS — M0600 Rheumatoid arthritis without rheumatoid factor, unspecified site: Secondary | ICD-10-CM

## 2018-11-17 DIAGNOSIS — E039 Hypothyroidism, unspecified: Secondary | ICD-10-CM

## 2018-11-17 DIAGNOSIS — Z8711 Personal history of peptic ulcer disease: Secondary | ICD-10-CM | POA: Diagnosis not present

## 2018-11-17 DIAGNOSIS — I152 Hypertension secondary to endocrine disorders: Secondary | ICD-10-CM

## 2018-11-17 DIAGNOSIS — I10 Essential (primary) hypertension: Secondary | ICD-10-CM

## 2018-11-17 NOTE — Progress Notes (Addendum)
Deborah Travis is a 78 y.o. female who presents for annual wellness visit and follow-up on chronic medical conditions.  She has recently been diagnosed with exocrine pancreatic insufficiency and is now on medication for this.  She has gained weight due to this and is very happy with that.  She does follow-up with her ophthalmologist on a regular basis.Continues on her thyroid medication as well as medicines for her lipids, cholesterol. She is now a former smoker.  Recent blood work did show an elevated creatinine however this could be slight dehydration.  She continues on metformin.  She sees her rheumatologist regularly and is doing well on her [place] Maintenance Immunization History  Administered Date(s) Administered  . Influenza Split 05/21/2011, 06/12/2012, 07/15/2013  . Influenza, High Dose Seasonal PF 07/13/2013, 07/01/2014, 06/01/2015, 06/15/2016, 04/15/2017, 05/19/2018  . Pneumococcal Conjugate-13 06/15/2016  . Pneumococcal Polysaccharide-23 06/12/2012  . Tdap 06/28/2011  . Zoster 06/27/2013   There are no preventive care reminders to display for this patient.  Last Pap smear:aged out Last mammogram: 10 years Last colonoscopy: Last DEXA:10-01-18 Dentist: six months Ophtho:05-20-18 Exercise: walking   Other doctors caring for patient include: Dr.Kale oncology, Dr. Estanislado Pandy rheumatology, Dr. Penelope Coop GI  Advanced directives: Does Patient Have a Medical Advance Directive?: Yes Type of Advance Directive: [place] will Does patient want to make changes to medical advance directive?: No - Patient declined Copy of [place] in Chart?: No - copy requested  Depression screen:  See questionnaire below.  Depression screen [facility] 2/9 11/17/2018 05/19/2018 04/15/2017 02/10/2016 09/16/2014  Decreased Interest 0 0 0 0 0  Down, Depressed, Hopeless 0 0 0 0 0  PHQ - 2 Score 0 0 0 0 0    Fall Risk Screen: see questionnaire  below. Fall Risk  11/17/2018 05/19/2018 04/15/2017 02/10/2016 09/16/2014  Falls in the past year? 0 No No No No    ADL screen:  See questionnaire below Functional Status Survey: Is the patient deaf or have difficulty hearing?: No Does the patient have difficulty seeing, even when wearing glasses/contacts?: No Does the patient have difficulty concentrating, remembering, or making decisions?: No Does the patient have difficulty walking or climbing stairs?: No Does the patient have difficulty dressing or bathing?: No Does the patient have difficulty doing errands alone such as visiting a doctor's office or shopping?: No   Review of Systems Constitutional: -, -unexpected weight change, -anorexia, -fatigue Allergy: -sneezing, -itching, -congestion Dermatology: denies changing moles, rash, lumps ENT: -runny nose, -ear pain, -sore throat,  Cardiology:  -chest pain, -palpitations, -orthopnea, Respiratory: -cough, -shortness of breath, -dyspnea on exertion, -wheezing,  Gastroenterology: -abdominal pain, -nausea, -vomiting, -diarrhea, -constipation, -dysphagia Hematology: -bleeding or bruising problems Musculoskeletal: -arthralgias, -myalgias, -joint swelling, -back pain, - Ophthalmology: -vision changes,  Urology: -dysuria, -difficulty urinating,  -urinary frequency, -urgency, incontinence Neurology: -, -numbness, , -memory loss, -falls, -dizziness    PHYSICAL EXAM:  BP 112/72 (BP Location: Left Arm, Patient Position: Sitting)   Pulse 74   Temp 98 F (36.7 C)   Ht 5' 2.75" (1.594 m)   Wt 110 lb 9.6 oz (50.2 kg)   SpO2 96%   BMI 19.75 kg/m   General Appearance: Alert, cooperative, no distress, appears stated age Head: Normocephalic, without obvious abnormality, atraumatic Eyes: PERRL, conjunctiva/corneas clear, EOM's intact, fundi benign Ears: Normal TM's and external ear canals Nose: Nares normal, mucosa normal, no drainage or sinus tenderness Throat: Lips, mucosa, and tongue  normal; teeth and gums  normal Neck: Supple, no lymphadenopathy;  thyroid:  no enlargement/tenderness/nodules; no carotid bruit or JVD Lungs: Clear to auscultation bilaterally without wheezes, rales or ronchi; respirations unlabored Heart: Regular rate and rhythm, S1 and S2 normal, no murmur, rubor gallop Abdomen: Soft, non-tender, nondistended, normoactive bowel sounds,  no masses, no hepatosplenomegaly Extremities: No clubbing, cyanosis or edema Pulses: 2+ and symmetric all extremities Skin:  Skin color, texture, turgor normal, no rashes or lesions Lymph nodes: Cervical, supraclavicular, and axillary nodes normal Neurologic:  CNII-XII intact, normal strength, sensation and gait; reflexes 2+ and symmetric throughout Psych: Normal mood, affect, hygiene and grooming.  ASSESSMENT/PLAN: Seronegative rheumatoid arthritis (HCC)  Glaucoma of both eyes, unspecified glaucoma type  Hypothyroidism, unspecified type  Hyperlipidemia associated with type 2 diabetes mellitus ([place])  Hypertension associated with diabetes ([place])  Exocrine pancreatic insufficiency  Former smoker  History of peptic ulcer disease  Type 2 diabetes with complication ([place]) I encouraged her to continue with her present medication regimen.  We will redo blood work with her next visit.    at least 30 minutes of aerobic activity at least 5 days/week and weight-bearing exercise 2x/week; p including goals of calcium and vitamin D intake and alcohol recommendations (less than or equal to 1 drink/day) reviewed;Immunization recommendations discussed.  Colonoscopy recommendations reviewed   Medicare Attestation I have personally reviewed: The patient's medical and social history Their use of alcohol, tobacco or illicit drugs Their current medications and supplements The patient's functional ability including ADLs,fall risks, home safety risks, cognitive, and hearing and visual impairment Diet and physical  activities Evidence for depression or mood disorders  The patient's weight, height, and BMI have been recorded in the chart.  I have made referrals, counseling, and provided education to the patient based on review of the above and I have provided the patient with a written personalized care plan for preventive services.     Jill Alexanders, MD   11/17/2018

## 2018-11-18 DIAGNOSIS — H40052 Ocular hypertension, left eye: Secondary | ICD-10-CM | POA: Diagnosis not present

## 2018-11-18 DIAGNOSIS — H02823 Cysts of right eye, unspecified eyelid: Secondary | ICD-10-CM | POA: Diagnosis not present

## 2018-11-18 DIAGNOSIS — H401121 Primary open-angle glaucoma, left eye, mild stage: Secondary | ICD-10-CM | POA: Diagnosis not present

## 2018-11-18 DIAGNOSIS — H40021 Open angle with borderline findings, high risk, right eye: Secondary | ICD-10-CM | POA: Diagnosis not present

## 2018-11-18 DIAGNOSIS — H40053 Ocular hypertension, bilateral: Secondary | ICD-10-CM | POA: Diagnosis not present

## 2018-12-02 NOTE — Progress Notes (Signed)
Virtual Visit via Telephone Note  I connected with Deborah Travis on 12/02/18 at  9:15 AM EDT by telephone and verified that I am speaking with the correct person using two identifiers.   I discussed the limitations, risks, security and privacy concerns of performing an evaluation and management service by telephone and the availability of in person appointments. I also discussed with the patient that there may be a patient responsible charge related to this service. The patient expressed understanding and agreed to proceed.  CC: Pain in both hands   History of Present Illness: Patient is a 78 year old female with a past medical history of seronegative rheumatoid arthritis and osteopenia.  She is on Arava 20 mg po daily.  She discontinued Enbrel in November after developing pneumonia.  She did not restart on Enbrel due to feeling like Deborah Travis has been effective.  She denies any recent RA flares. She continues to have pain in both hands.  She has some difficulty with opening jars.  She has some decreased grip strength.  She denies any joint swelling at this time.  She has thickening of the left wrist joint and bilateral CMC joints but she has no swelling or tenderness at this time.  She rates her a RA a 0/10.  Review of Systems  Constitutional: Negative for fever and malaise/fatigue.  Eyes: Negative for photophobia, pain, discharge and redness.  Respiratory: Negative for cough, shortness of breath and wheezing.   Cardiovascular: Negative for chest pain and palpitations.  Gastrointestinal: Positive for diarrhea. Negative for blood in stool and constipation.  Genitourinary: Negative for dysuria.  Musculoskeletal: Positive for joint pain. Negative for back pain, myalgias and neck pain.  Skin: Negative for rash.  Neurological: Negative for dizziness and headaches.  Psychiatric/Behavioral: Negative for depression. The patient is not nervous/anxious and does not have insomnia.       Observations/Objective: Physical Exam  Constitutional: She is oriented to person, place, and time.  Neurological: She is alert and oriented to person, place, and time.  Psychiatric: Mood, memory, affect and judgment normal.   Patient reports morning stiffness for 0 minutes.   Patient denies nocturnal pain.  Difficulty dressing/grooming: Denies Difficulty climbing stairs: Denies Difficulty getting out of chair: Denies Difficulty using hands for taps, buttons, cutlery, and/or writing: Reports  Assessment and Plan: Seronegative rheumatoid arthritis ([place]) - RF-,CCP-, 14-3-3 eta negative, Uric acid 4.8, ANA negative, no erosive changes on XR of hands: She contiues to have pain in both hands.   She has stiffness in both hands and decreased grip strength bilaterally.  She has no joint swelling at this time.  She has not had any recent rheumatoid arthritis flares.  Her left wrist synovitis resolved.  She is clinically doing well on Arava 20 mg po daily (started on 03/04/18).  She was started on Enbrel on 04/17/18 but discontinued on November 2019 due to developing pneumonia.  She did not restart on Enbrel due to feeling like Arava controlled her RA.  She will continue on Arava monotherapy at this time. She does not need any refills at this time.  She will follow up in 3 months.   High risk medication use -Arava 20 mg by mouth daily. D/c Enbrel in November 2019 due to developing pneumonia.  She did not restart Enbrel due to feeling like Arava was controlling her RA.  Last TB gold negative on 02/22/18. Future orders for CBC, CMP, and TB gold were placed today.    DDD (degenerative disc  disease), cervical: She has no neck pain at this time.  She has no symptoms of radiculopathy at this time.   Osteopenia of multiple sites: She takes a calcium and vitamin D supplement on a daily basis.  Vitamin D deficiency: She takes a vitamin D supplement on a daily basis.  Serum potassium elevated: Potassium  critical value of 6.9 on 05/15/18,  PCP and patient notified.  PCP instructed patient to stop lisinopril.  Potassium was 5.8 on 07/17/18.  Follow Up Instructions: She will follow up in 3-4 months. Future orders for CBC, CMP, and TB gold were placed today.     I discussed the assessment and treatment plan with the patient. The patient was provided an opportunity to ask questions and all were answered. The patient agreed with the plan and demonstrated an understanding of the instructions.   The patient was advised to call back or seek an in-person evaluation if the symptoms worsen or if the condition fails to improve as anticipated.  I provided 22 minutes of non-face-to-face time during this encounter.  [name], MD  Scribed by- Ofilia Neas, PA-C

## 2018-12-10 ENCOUNTER — Telehealth (INDEPENDENT_AMBULATORY_CARE_PROVIDER_SITE_OTHER): Payer: Medicare HMO | Admitting: Rheumatology

## 2018-12-10 ENCOUNTER — Encounter: Payer: Self-pay | Admitting: Rheumatology

## 2018-12-10 ENCOUNTER — Other Ambulatory Visit: Payer: Self-pay

## 2018-12-10 DIAGNOSIS — I152 Hypertension secondary to endocrine disorders: Secondary | ICD-10-CM

## 2018-12-10 DIAGNOSIS — D692 Other nonthrombocytopenic purpura: Secondary | ICD-10-CM

## 2018-12-10 DIAGNOSIS — M8589 Other specified disorders of bone density and structure, multiple sites: Secondary | ICD-10-CM | POA: Diagnosis not present

## 2018-12-10 DIAGNOSIS — Z8719 Personal history of other diseases of the digestive system: Secondary | ICD-10-CM

## 2018-12-10 DIAGNOSIS — E1169 Type 2 diabetes mellitus with other specified complication: Secondary | ICD-10-CM | POA: Diagnosis not present

## 2018-12-10 DIAGNOSIS — M0600 Rheumatoid arthritis without rheumatoid factor, unspecified site: Secondary | ICD-10-CM | POA: Diagnosis not present

## 2018-12-10 DIAGNOSIS — E559 Vitamin D deficiency, unspecified: Secondary | ICD-10-CM

## 2018-12-10 DIAGNOSIS — E785 Hyperlipidemia, unspecified: Secondary | ICD-10-CM

## 2018-12-10 DIAGNOSIS — E1159 Type 2 diabetes mellitus with other circulatory complications: Secondary | ICD-10-CM

## 2018-12-10 DIAGNOSIS — Z79899 Other long term (current) drug therapy: Secondary | ICD-10-CM

## 2018-12-10 DIAGNOSIS — E875 Hyperkalemia: Secondary | ICD-10-CM

## 2018-12-10 DIAGNOSIS — I10 Essential (primary) hypertension: Secondary | ICD-10-CM

## 2018-12-10 DIAGNOSIS — Z8639 Personal history of other endocrine, nutritional and metabolic disease: Secondary | ICD-10-CM

## 2018-12-10 DIAGNOSIS — Z8711 Personal history of peptic ulcer disease: Secondary | ICD-10-CM

## 2018-12-10 DIAGNOSIS — M5030 Other cervical disc degeneration, unspecified cervical region: Secondary | ICD-10-CM

## 2018-12-10 DIAGNOSIS — Z87891 Personal history of nicotine dependence: Secondary | ICD-10-CM

## 2018-12-11 DIAGNOSIS — H02822 Cysts of right lower eyelid: Secondary | ICD-10-CM | POA: Diagnosis not present

## 2018-12-29 ENCOUNTER — Ambulatory Visit (INDEPENDENT_AMBULATORY_CARE_PROVIDER_SITE_OTHER): Payer: Medicare HMO | Admitting: Family Medicine

## 2018-12-29 ENCOUNTER — Encounter: Payer: Self-pay | Admitting: Family Medicine

## 2018-12-29 ENCOUNTER — Other Ambulatory Visit: Payer: Self-pay

## 2018-12-29 VITALS — BP 110/72 | HR 102 | Temp 98.2°F | Wt 105.2 lb

## 2018-12-29 DIAGNOSIS — E1169 Type 2 diabetes mellitus with other specified complication: Secondary | ICD-10-CM | POA: Diagnosis not present

## 2018-12-29 DIAGNOSIS — E118 Type 2 diabetes mellitus with unspecified complications: Secondary | ICD-10-CM

## 2018-12-29 DIAGNOSIS — E119 Type 2 diabetes mellitus without complications: Secondary | ICD-10-CM

## 2018-12-29 DIAGNOSIS — E039 Hypothyroidism, unspecified: Secondary | ICD-10-CM | POA: Diagnosis not present

## 2018-12-29 DIAGNOSIS — K8681 Exocrine pancreatic insufficiency: Secondary | ICD-10-CM

## 2018-12-29 DIAGNOSIS — M0600 Rheumatoid arthritis without rheumatoid factor, unspecified site: Secondary | ICD-10-CM

## 2018-12-29 DIAGNOSIS — E785 Hyperlipidemia, unspecified: Secondary | ICD-10-CM

## 2018-12-29 DIAGNOSIS — E1159 Type 2 diabetes mellitus with other circulatory complications: Secondary | ICD-10-CM

## 2018-12-29 DIAGNOSIS — I10 Essential (primary) hypertension: Secondary | ICD-10-CM

## 2018-12-29 DIAGNOSIS — I152 Hypertension secondary to endocrine disorders: Secondary | ICD-10-CM

## 2018-12-29 DIAGNOSIS — Z79899 Other long term (current) drug therapy: Secondary | ICD-10-CM | POA: Diagnosis not present

## 2018-12-29 LAB — POCT GLYCOSYLATED HEMOGLOBIN (HGB A1C): Hemoglobin A1C: 7.8 % — AB (ref 4.0–5.6)

## 2018-12-29 MED ORDER — METFORMIN HCL 1000 MG PO TABS: ORAL_TABLET | ORAL | 1 refills | Status: DC

## 2018-12-29 MED ORDER — LEVOTHYROXINE SODIUM 100 MCG PO TABS: 100.0000 ug | ORAL_TABLET | Freq: Every day | ORAL | 3 refills | Status: DC

## 2018-12-29 MED ORDER — LISINOPRIL 10 MG PO TABS: 10.0000 mg | ORAL_TABLET | Freq: Every day | ORAL | 3 refills | Status: DC

## 2018-12-29 MED ORDER — PRAVASTATIN SODIUM 20 MG PO TABS: 20.0000 mg | ORAL_TABLET | Freq: Every day | ORAL | 3 refills | Status: DC

## 2018-12-29 NOTE — Progress Notes (Signed)
  Subjective:    Patient ID: Deborah Travis, female    DOB: 04-Nov-1940, 78 y.o.   MRN: [id number]  Deborah Travis is a 78 y.o. female who presents for follow-up of Type 2 diabetes mellitus.  Home blood sugar records: postprandial range: 125 Current symptoms/problems include none and have been unchanged. Daily foot checks:   Any foot concerns: No   Exercise: Home exercise routine includes walking one hrs per day. Diet: Regular She continues to be followed for her seronegative rheumatoid arthritis and does need some blood work concerning that.  She continues on her thyroid medication as well as Zestril, metformin, Pravachol.  She is also taking meds to help with her exocrine insufficiency.  She is followed by GI for that.  She no longer smokes. The following portions of the patient's history were reviewed and updated as appropriate: allergies, current medications, past medical history, past social history and problem list.  ROS as in subjective above.     Objective:    Physical Exam Alert and in no distress foot exam is negative  Blood pressure 110/72, pulse (!) 102, temperature 98.2 F (36.8 C), weight 105 lb 3.2 oz (47.7 kg), SpO2 97 %.  Lab Review Diabetic Labs Latest Ref Rng & Units 08/12/2018 07/22/2018 07/17/2018 05/19/2018 05/15/2018  HbA1c 4.0 - 5.6 % 7.3(A) - - - -  Microalbumin mg/L - - - - -  Micro/Creat Ratio - - - - - -  Chol 100 - 199 mg/dL - - - - -  HDL >39 mg/dL - - - - -  Calc LDL 0 - 99 mg/dL - - - - -  Triglycerides 0 - 149 mg/dL - - - - -  Creatinine 0.44 - 1.00 mg/dL - 1.60(H) 1.11(H) 1.01(H) 1.17(H)   BP/Weight 12/29/2018 11/17/2018 08/12/2018 07/30/2018 20/04/4708  Systolic BP 628 366 294 765 465  Diastolic BP 72 72 68 68 73  Wt. (Lbs) 105.2 110.6 102 102.8 101.5  BMI 18.78 19.75 18.21 18.36 18.12   Foot/eye exam completion dates Latest Ref Rng & Units 05/20/2018 12/24/2017  Eye Exam No Retinopathy No Retinopathy -  Foot Form Completion - - Done  A1c is 7.8  Deborah Travis  reports that she quit smoking about 3 years ago. She smoked 0.10 packs per day. She has never used smokeless tobacco. She reports that she does not drink alcohol or use drugs.     Assessment & Plan:    Encounter for long-term (current) use of high-risk medication - Plan: CBC with Differential/Platelet, Comprehensive metabolic panel, QuantiFERON-TB Gold Plus  Seronegative rheumatoid arthritis ([place])  Hypothyroidism, unspecified type  Hyperlipidemia associated with type 2 diabetes mellitus ([place])  Hypertension associated with diabetes ([place])  1. Rx changes: none 2. Education: Reviewed 'ABCs' of diabetes management (respective goals in parentheses):  A1C (<7), blood pressure (<130/80), and cholesterol (LDL <100). 3. Compliance at present is estimated to be good. Efforts to improve compliance (if necessary) will be directed at nothing. 4. Follow up: 4 months She will continue on her present medication regimen.  I discussed the fact that her A1c did seem to be going up slightly.  Again discussed diet, exercise and use of medications.

## 2018-12-30 LAB — LIPID PANEL
Chol/HDL Ratio: 2.5 ratio (ref 0.0–4.4)
Cholesterol, Total: 126 mg/dL (ref 100–199)
HDL: 51 mg/dL (ref >39)
LDL Calculated: 42 mg/dL (ref 0–99)
Triglycerides: 163 mg/dL — ABNORMAL HIGH (ref 0–149)
VLDL Cholesterol Cal: 33 mg/dL (ref 5–40)

## 2018-12-30 LAB — TSH: TSH: 4.57 u[IU]/mL — ABNORMAL HIGH (ref 0.450–4.500)

## 2018-12-31 ENCOUNTER — Other Ambulatory Visit: Payer: Self-pay | Admitting: Rheumatology

## 2018-12-31 LAB — CBC WITH DIFFERENTIAL/PLATELET
Basophils Absolute: 0 10*3/uL (ref 0.0–0.2)
Basos: 0 %
EOS (ABSOLUTE): 0.1 10*3/uL (ref 0.0–0.4)
Eos: 2 %
Hematocrit: 37.4 % (ref 34.0–46.6)
Hemoglobin: 13 g/dL (ref 11.1–15.9)
Immature Grans (Abs): 0 10*3/uL (ref 0.0–0.1)
Immature Granulocytes: 0 %
Lymphocytes Absolute: 2.4 10*3/uL (ref 0.7–3.1)
Lymphs: 28 %
MCH: 31.2 pg (ref 26.6–33.0)
MCHC: 34.8 g/dL (ref 31.5–35.7)
MCV: 90 fL (ref 79–97)
Monocytes Absolute: 0.9 10*3/uL (ref 0.1–0.9)
Monocytes: 10 %
Neutrophils Absolute: 4.9 10*3/uL (ref 1.4–7.0)
Neutrophils: 60 %
Platelets: 306 10*3/uL (ref 150–450)
RBC: 4.17 x10E6/uL (ref 3.77–5.28)
RDW: 13.1 % (ref 11.7–15.4)
WBC: 8.3 10*3/uL (ref 3.4–10.8)

## 2018-12-31 LAB — COMPREHENSIVE METABOLIC PANEL
ALT: 22 IU/L (ref 0–32)
AST: 30 IU/L (ref 0–40)
Albumin/Globulin Ratio: 1.8 (ref 1.2–2.2)
Albumin: 4.4 g/dL (ref 3.7–4.7)
Alkaline Phosphatase: 111 IU/L (ref 39–117)
BUN/Creatinine Ratio: 15 (ref 12–28)
BUN: 18 mg/dL (ref 8–27)
Bilirubin Total: 0.4 mg/dL (ref 0.0–1.2)
CO2: 19 mmol/L — ABNORMAL LOW (ref 20–29)
Calcium: 9.8 mg/dL (ref 8.7–10.3)
Chloride: 105 mmol/L (ref 96–106)
Creatinine, Ser: 1.23 mg/dL — ABNORMAL HIGH (ref 0.57–1.00)
GFR calc Af Amer: 50 mL/min/{1.73_m2} — ABNORMAL LOW (ref >59)
GFR calc non Af Amer: 43 mL/min/{1.73_m2} — ABNORMAL LOW (ref >59)
Globulin, Total: 2.5 g/dL (ref 1.5–4.5)
Glucose: 121 mg/dL — ABNORMAL HIGH (ref 65–99)
Potassium: 5.3 mmol/L — ABNORMAL HIGH (ref 3.5–5.2)
Sodium: 140 mmol/L (ref 134–144)
Total Protein: 6.9 g/dL (ref 6.0–8.5)

## 2018-12-31 LAB — QUANTIFERON-TB GOLD PLUS
QuantiFERON Mitogen Value: >10 IU/mL
QuantiFERON Nil Value: 0.03 IU/mL
QuantiFERON TB1 Ag Value: 0.03 IU/mL
QuantiFERON TB2 Ag Value: 0.03 IU/mL
QuantiFERON-TB Gold Plus: NEGATIVE

## 2018-12-31 MED ORDER — LEFLUNOMIDE 20 MG PO TABS: 20.0000 mg | ORAL_TABLET | Freq: Every day | ORAL | 0 refills | Status: DC

## 2018-12-31 NOTE — Telephone Encounter (Signed)
ok 

## 2018-12-31 NOTE — Telephone Encounter (Signed)
Called patient and advised patient prescription has been sent to [facility].

## 2018-12-31 NOTE — Telephone Encounter (Signed)
Last Visit: 12/10/2018 telemedicine  Next Visit: 03/10/2019 Labs: 12/29/2018 creat 1.23 prev 1.60, GFR 43 prev 31  Okay to refill per arava?

## 2018-12-31 NOTE — Telephone Encounter (Signed)
Patient [facility] requesting a refill on Leflunomide 20 mg sent to [facility]. Please call to advise.

## 2019-01-01 ENCOUNTER — Telehealth: Payer: Self-pay | Admitting: Rheumatology

## 2019-01-01 NOTE — Telephone Encounter (Signed)
Patient called stating she had labwork at Dr. Lanice Shirts office on 12/29/18.  Patient states the results are in her chart in Epic.

## 2019-01-01 NOTE — Telephone Encounter (Signed)
Noted  

## 2019-01-20 ENCOUNTER — Encounter (HOSPITAL_COMMUNITY): Payer: Self-pay | Admitting: Family Medicine

## 2019-01-20 ENCOUNTER — Other Ambulatory Visit: Payer: Self-pay

## 2019-01-20 ENCOUNTER — Emergency Department (HOSPITAL_COMMUNITY): Payer: Medicare HMO

## 2019-01-20 ENCOUNTER — Inpatient Hospital Stay (HOSPITAL_COMMUNITY)
Admission: EM | Admit: 2019-01-20 | Discharge: 2019-01-23 | DRG: 439 | Disposition: A | Discharge status: Home / Self Care | Payer: Medicare HMO | Attending: Internal Medicine | Admitting: Internal Medicine

## 2019-01-20 DIAGNOSIS — D649 Anemia, unspecified: Secondary | ICD-10-CM | POA: Diagnosis present

## 2019-01-20 DIAGNOSIS — Z87891 Personal history of nicotine dependence: Secondary | ICD-10-CM | POA: Diagnosis not present

## 2019-01-20 DIAGNOSIS — I129 Hypertensive chronic kidney disease with stage 1 through stage 4 chronic kidney disease, or unspecified chronic kidney disease: Secondary | ICD-10-CM | POA: Diagnosis present

## 2019-01-20 DIAGNOSIS — E1122 Type 2 diabetes mellitus with diabetic chronic kidney disease: Secondary | ICD-10-CM | POA: Diagnosis present

## 2019-01-20 DIAGNOSIS — R1012 Left upper quadrant pain: Secondary | ICD-10-CM | POA: Diagnosis not present

## 2019-01-20 DIAGNOSIS — Z90411 Acquired partial absence of pancreas: Secondary | ICD-10-CM

## 2019-01-20 DIAGNOSIS — M8580 Other specified disorders of bone density and structure, unspecified site: Secondary | ICD-10-CM | POA: Diagnosis present

## 2019-01-20 DIAGNOSIS — E039 Hypothyroidism, unspecified: Secondary | ICD-10-CM | POA: Diagnosis present

## 2019-01-20 DIAGNOSIS — Z03818 Encounter for observation for suspected exposure to other biological agents ruled out: Secondary | ICD-10-CM | POA: Diagnosis not present

## 2019-01-20 DIAGNOSIS — K8689 Other specified diseases of pancreas: Secondary | ICD-10-CM

## 2019-01-20 DIAGNOSIS — E118 Type 2 diabetes mellitus with unspecified complications: Secondary | ICD-10-CM | POA: Diagnosis present

## 2019-01-20 DIAGNOSIS — Z9049 Acquired absence of other specified parts of digestive tract: Secondary | ICD-10-CM | POA: Diagnosis not present

## 2019-01-20 DIAGNOSIS — K861 Other chronic pancreatitis: Secondary | ICD-10-CM | POA: Diagnosis not present

## 2019-01-20 DIAGNOSIS — H35039 Hypertensive retinopathy, unspecified eye: Secondary | ICD-10-CM | POA: Diagnosis present

## 2019-01-20 DIAGNOSIS — E785 Hyperlipidemia, unspecified: Secondary | ICD-10-CM | POA: Diagnosis present

## 2019-01-20 DIAGNOSIS — K219 Gastro-esophageal reflux disease without esophagitis: Secondary | ICD-10-CM | POA: Diagnosis present

## 2019-01-20 DIAGNOSIS — E1169 Type 2 diabetes mellitus with other specified complication: Secondary | ICD-10-CM | POA: Diagnosis not present

## 2019-01-20 DIAGNOSIS — Z881 Allergy status to other antibiotic agents status: Secondary | ICD-10-CM

## 2019-01-20 DIAGNOSIS — Z87442 Personal history of urinary calculi: Secondary | ICD-10-CM | POA: Diagnosis not present

## 2019-01-20 DIAGNOSIS — Z20828 Contact with and (suspected) exposure to other viral communicable diseases: Secondary | ICD-10-CM | POA: Diagnosis not present

## 2019-01-20 DIAGNOSIS — E1159 Type 2 diabetes mellitus with other circulatory complications: Secondary | ICD-10-CM | POA: Diagnosis present

## 2019-01-20 DIAGNOSIS — Z79899 Other long term (current) drug therapy: Secondary | ICD-10-CM

## 2019-01-20 DIAGNOSIS — R110 Nausea: Secondary | ICD-10-CM | POA: Diagnosis not present

## 2019-01-20 DIAGNOSIS — R112 Nausea with vomiting, unspecified: Secondary | ICD-10-CM | POA: Diagnosis not present

## 2019-01-20 DIAGNOSIS — M0600 Rheumatoid arthritis without rheumatoid factor, unspecified site: Secondary | ICD-10-CM | POA: Diagnosis present

## 2019-01-20 DIAGNOSIS — N179 Acute kidney failure, unspecified: Secondary | ICD-10-CM | POA: Diagnosis present

## 2019-01-20 DIAGNOSIS — R932 Abnormal findings on diagnostic imaging of liver and biliary tract: Secondary | ICD-10-CM | POA: Diagnosis not present

## 2019-01-20 DIAGNOSIS — R109 Unspecified abdominal pain: Secondary | ICD-10-CM | POA: Diagnosis present

## 2019-01-20 DIAGNOSIS — E559 Vitamin D deficiency, unspecified: Secondary | ICD-10-CM | POA: Diagnosis present

## 2019-01-20 DIAGNOSIS — N182 Chronic kidney disease, stage 2 (mild): Secondary | ICD-10-CM | POA: Diagnosis present

## 2019-01-20 DIAGNOSIS — Z8711 Personal history of peptic ulcer disease: Secondary | ICD-10-CM | POA: Diagnosis not present

## 2019-01-20 DIAGNOSIS — I152 Hypertension secondary to endocrine disorders: Secondary | ICD-10-CM | POA: Diagnosis present

## 2019-01-20 DIAGNOSIS — K851 Biliary acute pancreatitis without necrosis or infection: Secondary | ICD-10-CM | POA: Diagnosis not present

## 2019-01-20 DIAGNOSIS — I10 Essential (primary) hypertension: Secondary | ICD-10-CM | POA: Diagnosis not present

## 2019-01-20 DIAGNOSIS — Z79890 Hormone replacement therapy (postmenopausal): Secondary | ICD-10-CM | POA: Diagnosis not present

## 2019-01-20 DIAGNOSIS — K858 Other acute pancreatitis without necrosis or infection: Secondary | ICD-10-CM | POA: Diagnosis not present

## 2019-01-20 DIAGNOSIS — K859 Acute pancreatitis without necrosis or infection, unspecified: Principal | ICD-10-CM | POA: Diagnosis present

## 2019-01-20 DIAGNOSIS — H409 Unspecified glaucoma: Secondary | ICD-10-CM | POA: Diagnosis present

## 2019-01-20 DIAGNOSIS — Z7984 Long term (current) use of oral hypoglycemic drugs: Secondary | ICD-10-CM

## 2019-01-20 DIAGNOSIS — R1013 Epigastric pain: Secondary | ICD-10-CM | POA: Diagnosis present

## 2019-01-20 DIAGNOSIS — K8500 Idiopathic acute pancreatitis without necrosis or infection: Secondary | ICD-10-CM | POA: Diagnosis not present

## 2019-01-20 DIAGNOSIS — Z885 Allergy status to narcotic agent status: Secondary | ICD-10-CM

## 2019-01-20 DIAGNOSIS — Z8719 Personal history of other diseases of the digestive system: Secondary | ICD-10-CM

## 2019-01-20 LAB — COMPREHENSIVE METABOLIC PANEL
ALT: 48 U/L — ABNORMAL HIGH (ref 0–44)
AST: 41 U/L (ref 15–41)
Albumin: 4.5 g/dL (ref 3.5–5.0)
Alkaline Phosphatase: 113 U/L (ref 38–126)
Anion gap: 11 (ref 5–15)
BUN: 22 mg/dL (ref 8–23)
CO2: 20 mmol/L — ABNORMAL LOW (ref 22–32)
Calcium: 8.9 mg/dL (ref 8.9–10.3)
Chloride: 105 mmol/L (ref 98–111)
Creatinine, Ser: 1.09 mg/dL — ABNORMAL HIGH (ref 0.44–1.00)
GFR calc Af Amer: 58 mL/min — ABNORMAL LOW (ref >60)
GFR calc non Af Amer: 50 mL/min — ABNORMAL LOW (ref >60)
Glucose, Bld: 223 mg/dL — ABNORMAL HIGH (ref 70–99)
Potassium: 4.1 mmol/L (ref 3.5–5.1)
Sodium: 136 mmol/L (ref 135–145)
Total Bilirubin: 0.6 mg/dL (ref 0.3–1.2)
Total Protein: 7.7 g/dL (ref 6.5–8.1)

## 2019-01-20 LAB — CBC WITH DIFFERENTIAL/PLATELET
Abs Immature Granulocytes: 0.03 10*3/uL (ref 0.00–0.07)
Basophils Absolute: 0 10*3/uL (ref 0.0–0.1)
Basophils Relative: 0 %
Eosinophils Absolute: 0 10*3/uL (ref 0.0–0.5)
Eosinophils Relative: 0 %
HCT: 39.1 % (ref 36.0–46.0)
Hemoglobin: 13.3 g/dL (ref 12.0–15.0)
Immature Granulocytes: 0 %
Lymphocytes Relative: 12 %
Lymphs Abs: 1.3 10*3/uL (ref 0.7–4.0)
MCH: 31.8 pg (ref 26.0–34.0)
MCHC: 34 g/dL (ref 30.0–36.0)
MCV: 93.5 fL (ref 80.0–100.0)
Monocytes Absolute: 0.7 10*3/uL (ref 0.1–1.0)
Monocytes Relative: 6 %
Neutro Abs: 9.5 10*3/uL — ABNORMAL HIGH (ref 1.7–7.7)
Neutrophils Relative %: 82 %
Platelets: 254 10*3/uL (ref 150–400)
RBC: 4.18 MIL/uL (ref 3.87–5.11)
RDW: 14.1 % (ref 11.5–15.5)
WBC: 11.5 10*3/uL — ABNORMAL HIGH (ref 4.0–10.5)
nRBC: 0 % (ref 0.0–0.2)

## 2019-01-20 LAB — URINALYSIS, ROUTINE W REFLEX MICROSCOPIC
Bilirubin Urine: NEGATIVE
Glucose, UA: 150 mg/dL — AB
Hgb urine dipstick: NEGATIVE
Ketones, ur: 20 mg/dL — AB
Leukocytes,Ua: NEGATIVE
Nitrite: NEGATIVE
Protein, ur: NEGATIVE mg/dL
Specific Gravity, Urine: 1.016 (ref 1.005–1.030)
pH: 5 (ref 5.0–8.0)

## 2019-01-20 LAB — LIPASE, BLOOD
Lipase: 16 U/L (ref 11–51)
Lipase: 19 U/L (ref 11–51)

## 2019-01-20 LAB — SARS CORONAVIRUS 2 BY RT PCR (HOSPITAL ORDER, PERFORMED IN ~~LOC~~ HOSPITAL LAB): SARS Coronavirus 2: NEGATIVE

## 2019-01-20 MED ORDER — SODIUM CHLORIDE 0.9 % IV SOLN
INTRAVENOUS | Status: DC
  Administered 2019-01-20 – 2019-01-23 (×9): via INTRAVENOUS

## 2019-01-20 MED ORDER — SODIUM CHLORIDE 0.9% FLUSH
10.0000 mL | Freq: Two times a day (BID) | INTRAVENOUS | Status: DC
  Administered 2019-01-20 (×2): 10 mL

## 2019-01-20 MED ORDER — ONDANSETRON HCL 4 MG/2ML IJ SOLN
4.0000 mg | Freq: Once | INTRAMUSCULAR | Status: AC
  Administered 2019-01-20: 04:00:00 4 mg via INTRAVENOUS
  Filled 2019-01-20: qty 2

## 2019-01-20 MED ORDER — FENTANYL CITRATE (PF) 100 MCG/2ML IJ SOLN
50.0000 ug | Freq: Once | INTRAMUSCULAR | Status: AC
  Administered 2019-01-20: 04:00:00 50 ug via INTRAVENOUS
  Filled 2019-01-20: qty 2

## 2019-01-20 MED ORDER — SODIUM CHLORIDE 0.9% FLUSH: 10.0000 mL | INTRAVENOUS | Status: DC | PRN

## 2019-01-20 MED ORDER — MORPHINE SULFATE (PF) 2 MG/ML IV SOLN: 2.0000 mg | INTRAVENOUS | Status: DC | PRN

## 2019-01-20 MED ORDER — IOHEXOL 300 MG/ML  SOLN
100.0000 mL | Freq: Once | INTRAMUSCULAR | Status: AC | PRN
  Administered 2019-01-20: 05:00:00 80 mL via INTRAVENOUS

## 2019-01-20 MED ORDER — FENTANYL CITRATE (PF) 100 MCG/2ML IJ SOLN
25.0000 ug | INTRAMUSCULAR | Status: DC | PRN
  Administered 2019-01-20 – 2019-01-21 (×5): 25 ug via INTRAVENOUS
  Filled 2019-01-20 (×5): qty 2

## 2019-01-20 MED ORDER — LEVOTHYROXINE SODIUM 100 MCG PO TABS
100.0000 ug | ORAL_TABLET | Freq: Every day | ORAL | Status: DC
  Administered 2019-01-20 – 2019-01-23 (×4): 100 ug via ORAL
  Filled 2019-01-20 (×4): qty 1

## 2019-01-20 MED ORDER — HEPARIN SODIUM (PORCINE) 5000 UNIT/ML IJ SOLN
5000.0000 [IU] | Freq: Three times a day (TID) | INTRAMUSCULAR | Status: DC
  Administered 2019-01-20 – 2019-01-23 (×10): 5000 [IU] via SUBCUTANEOUS
  Filled 2019-01-20 (×10): qty 1

## 2019-01-20 MED ORDER — ONDANSETRON HCL 4 MG/2ML IJ SOLN
4.0000 mg | Freq: Four times a day (QID) | INTRAMUSCULAR | Status: DC | PRN
  Administered 2019-01-20: 10:00:00 4 mg via INTRAVENOUS
  Filled 2019-01-20: qty 2

## 2019-01-20 MED ORDER — FAMOTIDINE 20 MG PO TABS
10.0000 mg | ORAL_TABLET | Freq: Three times a day (TID) | ORAL | Status: DC
  Administered 2019-01-20 – 2019-01-23 (×10): 10 mg via ORAL
  Filled 2019-01-20 (×10): qty 1

## 2019-01-20 MED ORDER — FENTANYL CITRATE (PF) 100 MCG/2ML IJ SOLN
50.0000 ug | INTRAMUSCULAR | Status: DC | PRN
  Administered 2019-01-20: 07:00:00 50 ug via INTRAVENOUS
  Filled 2019-01-20: qty 2

## 2019-01-20 MED ORDER — SODIUM CHLORIDE 0.9 % IV BOLUS
1000.0000 mL | Freq: Once | INTRAVENOUS | Status: AC
  Administered 2019-01-20: 04:00:00 1000 mL via INTRAVENOUS

## 2019-01-20 MED ORDER — PANCRELIPASE (LIP-PROT-AMYL) 12000-38000 UNITS PO CPEP
36000.0000 [IU] | ORAL_CAPSULE | Freq: Three times a day (TID) | ORAL | Status: DC
  Administered 2019-01-20 – 2019-01-23 (×10): 36000 [IU] via ORAL
  Filled 2019-01-20 (×10): qty 3

## 2019-01-20 MED ORDER — SODIUM CHLORIDE (PF) 0.9 % IJ SOLN
INTRAMUSCULAR | Status: AC
  Filled 2019-01-20: qty 50

## 2019-01-20 MED ORDER — MORPHINE SULFATE (PF) 2 MG/ML IV SOLN
2.0000 mg | INTRAVENOUS | Status: DC | PRN
  Administered 2019-01-20 – 2019-01-21 (×6): 2 mg via INTRAVENOUS
  Filled 2019-01-20 (×6): qty 1

## 2019-01-20 MED ORDER — MORPHINE SULFATE (PF) 4 MG/ML IV SOLN
4.0000 mg | INTRAVENOUS | Status: DC | PRN
  Administered 2019-01-20: 4 mg via INTRAVENOUS
  Filled 2019-01-20 (×2): qty 1

## 2019-01-20 MED ORDER — FENTANYL CITRATE (PF) 100 MCG/2ML IJ SOLN
50.0000 ug | Freq: Once | INTRAMUSCULAR | Status: AC
  Administered 2019-01-20: 06:00:00 50 ug via INTRAVENOUS
  Filled 2019-01-20: qty 2

## 2019-01-20 MED ORDER — LISINOPRIL 10 MG PO TABS
10.0000 mg | ORAL_TABLET | Freq: Every day | ORAL | Status: DC
  Administered 2019-01-20 – 2019-01-23 (×4): 10 mg via ORAL
  Filled 2019-01-20 (×4): qty 1

## 2019-01-20 MED ORDER — PRAVASTATIN SODIUM 20 MG PO TABS
20.0000 mg | ORAL_TABLET | Freq: Every day | ORAL | Status: DC
  Administered 2019-01-20 – 2019-01-23 (×4): 20 mg via ORAL
  Filled 2019-01-20 (×4): qty 1

## 2019-01-20 NOTE — ED Notes (Signed)
Hospitalist at bedside 

## 2019-01-20 NOTE — ED Notes (Signed)
ED TO INPATIENT HANDOFF REPORT  ED Nurse Name and Phone #: [phone] Cari  S Name/Age/Gender Deborah Travis 78 y.o. female Room/Bed: WA11/WA11  Code Status   Code Status: Full Code  Home/SNF/Other Home Patient oriented to: self, place, time and situation Is this baseline? Yes   Triage Complete: Triage complete  Chief Complaint Abdominal Pain  Triage Note Patient is from home and transported via [facility]. Patient is complaining of left lower quad pain. Associated symptoms of nausea and vomiting. Patient reported to EMS that her symptoms started after she went to a cookout.    Allergies Allergies  Allergen Reactions  . Levofloxacin     Muscle spasms and tightness   . Codeine Itching  . Morphine And Related Itching    Level of Care/Admitting Diagnosis ED Disposition    ED Disposition Condition Comment   Admit  Hospital Area: [facility] [[id number]]  Level of Care: Med-Surg [16]  Covid Evaluation: N/A  Diagnosis: Acute pancreatitis [577.0.ICD-9-CM]  Admitting Physician: Shelly Coss [[id number]]  Attending Physician: Shelly Coss [[id number]]  Estimated length of stay: past midnight tomorrow  Certification:: I certify this patient will need inpatient services for at least 2 midnights  PT Class (Do Not Modify): Inpatient [101]  PT Acc Code (Do Not Modify): Private [1]       B Medical/Surgery History Past Medical History:  Diagnosis Date  . Chronic kidney disease    RENAL STONE  . Chronic pancreatitis ([place])   . Diabetes mellitus   . Duodenal ulcer   . Dyslipidemia   . GERD (gastroesophageal reflux disease)   . Hypertension   . Osteopenia   . Thyroid disease    HYPOTHYROID  . Vitamin D deficiency    Past Surgical History:  Procedure Laterality Date  . APPENDECTOMY    . CATARACT EXTRACTION Right 2014  . CHOLECYSTECTOMY    . TONSILECTOMY, ADENOIDECTOMY, BILATERAL MYRINGOTOMY AND TUBES    . [place]     A IV  Location/Drains/Wounds Patient Lines/Drains/Airways Status   Active Line/Drains/Airways    Name:   Placement date:   Placement time:   Site:   Days:   Peripheral IV 01/20/19 Left Arm   01/20/19    0347    Arm   less than 1   Wound 01/12/12 Abrasion(s);Puncture Arm Right;Left abrasions, consistent w/ puncture wounds with scabs, noted to RT & LT forearms (RT is from dog, LT is from cat)   01/12/12    1030    Arm   2565          Intake/Output Last 24 hours No intake or output data in the 24 hours ending 01/20/19 0802  Labs/Imaging Results for orders placed or performed during the hospital encounter of 01/20/19 (from the past 48 hour(s))  CBC with Differential     Status: Abnormal   Collection Time: 01/20/19  2:56 AM  Result Value Ref Range   WBC 11.5 (H) 4.0 - 10.5 K/uL   RBC 4.18 3.87 - 5.11 MIL/uL   Hemoglobin 13.3 12.0 - 15.0 g/dL   HCT 39.1 36.0 - 46.0 %   MCV 93.5 80.0 - 100.0 fL   MCH 31.8 26.0 - 34.0 pg   MCHC 34.0 30.0 - 36.0 g/dL   RDW 14.1 11.5 - 15.5 %   Platelets 254 150 - 400 K/uL   nRBC 0.0 0.0 - 0.2 %   Neutrophils Relative % 82 %   Neutro Abs 9.5 (H) 1.7 -  7.7 K/uL   Lymphocytes Relative 12 %   Lymphs Abs 1.3 0.7 - 4.0 K/uL   Monocytes Relative 6 %   Monocytes Absolute 0.7 0.1 - 1.0 K/uL   Eosinophils Relative 0 %   Eosinophils Absolute 0.0 0.0 - 0.5 K/uL   Basophils Relative 0 %   Basophils Absolute 0.0 0.0 - 0.1 K/uL   Immature Granulocytes 0 %   Abs Immature Granulocytes 0.03 0.00 - 0.07 K/uL    Comment: Performed at [facility], [place] [address], [place], [place] [address]  Comprehensive metabolic panel     Status: Abnormal   Collection Time: 01/20/19  2:56 AM  Result Value Ref Range   Sodium 136 135 - 145 mmol/L   Potassium 4.1 3.5 - 5.1 mmol/L   Chloride 105 98 - 111 mmol/L   CO2 20 (L) 22 - 32 mmol/L   Glucose, Bld 223 (H) 70 - 99 mg/dL   BUN 22 8 - 23 mg/dL   Creatinine, Ser 1.09 (H) 0.44 - 1.00 mg/dL   Calcium 8.9 8.9 - 10.3  mg/dL   Total Protein 7.7 6.5 - 8.1 g/dL   Albumin 4.5 3.5 - 5.0 g/dL   AST 41 15 - 41 U/L   ALT 48 (H) 0 - 44 U/L   Alkaline Phosphatase 113 38 - 126 U/L   Total Bilirubin 0.6 0.3 - 1.2 mg/dL   GFR calc non Af Amer 50 (L) >60 mL/min   GFR calc Af Amer 58 (L) >60 mL/min   Anion gap 11 5 - 15    Comment: Performed at [facility], [place] [address] [place], [place] [address]  Lipase, blood     Status: None   Collection Time: 01/20/19  2:56 AM  Result Value Ref Range   Lipase 16 11 - 51 U/L    Comment: Performed at [facility], [place] [address], [place], [place] [address]  Urinalysis, Routine w reflex microscopic     Status: Abnormal   Collection Time: 01/20/19  2:56 AM  Result Value Ref Range   Color, Urine YELLOW YELLOW   APPearance CLEAR CLEAR   Specific Gravity, Urine 1.016 1.005 - 1.030   pH 5.0 5.0 - 8.0   Glucose, UA 150 (A) NEGATIVE mg/dL   Hgb urine dipstick NEGATIVE NEGATIVE   Bilirubin Urine NEGATIVE NEGATIVE   Ketones, ur 20 (A) NEGATIVE mg/dL   Protein, ur NEGATIVE NEGATIVE mg/dL   Nitrite NEGATIVE NEGATIVE   Leukocytes,Ua NEGATIVE NEGATIVE    Comment: Performed at [facility] [address] [place], [place] [address]   Ct Abdomen Pelvis W Contrast  Result Date: 01/20/2019 CLINICAL DATA:  Acute generalized abdominal pain EXAM: CT ABDOMEN AND PELVIS WITH CONTRAST TECHNIQUE: Multidetector CT imaging of the abdomen and pelvis was performed using the standard protocol following bolus administration of intravenous contrast. CONTRAST:  5mL OMNIPAQUE IOHEXOL 300 MG/ML  SOLN COMPARISON:  08/07/2018 FINDINGS: Lower chest:  No contributory findings. Hepatobiliary: Fat deposition about the falciform ligament. Small subcapsular right hepatic cyst.Pneumobilia after Whipple procedure. Pancreas: Whipple procedure with chronic pancreatitis showing atrophy and coarse calcifications. The main duct is chronically dilated and there is  ductal calculi with more proximal parenchymal edema and peripancreatic fat stranding. No abscess or collection. Spleen: Unremarkable. Adrenals/Urinary Tract: Negative adrenals. Renal cysts and scarring. Unremarkable bladder. Stomach/Bowel:  No obstruction. No appendicitis. Vascular/Lymphatic: No acute vascular abnormality. Atherosclerosis. No mass or adenopathy. Reproductive:No pathologic findings. Other: No ascites or pneumoperitoneum.  Musculoskeletal: No acute abnormalities. L5-S1 advanced disc degeneration. IMPRESSION: Acute pancreatitis proximal to pancreatic duct calculi. There is a background of chronic pancreatitis after Whipple procedure. Electronically Signed   By: Monte Fantasia M.D.   On: 01/20/2019 05:42    Pending Labs Unresulted Labs (From admission, onward)    Start     Ordered   01/21/19 0500  Comprehensive metabolic panel  Tomorrow morning,   R     01/20/19 0736   01/21/19 0500  CBC  Tomorrow morning,   R     01/20/19 0736   01/20/19 0738  SARS Coronavirus 2 (CEPHEID - Performed in [place] hospital lab), Hosp Order  (Asymptomatic Patients Labs)  Once,   R    Question:  Rule Out  Answer:  Yes   01/20/19 0737   01/20/19 0736  CBC  (heparin)  Once,   R    Comments:  Baseline for heparin therapy IF NOT ALREADY DRAWN.  Notify MD if PLT < 100 K.    01/20/19 0736   01/20/19 0736  Creatinine, serum  (heparin)  Once,   R    Comments:  Baseline for heparin therapy IF NOT ALREADY DRAWN.    01/20/19 0736          Vitals/Pain Today's Vitals   01/20/19 0615 01/20/19 0630 01/20/19 0709 01/20/19 0730  BP:  (!) 183/97 (!) 164/92 (!) 176/96  Pulse:  82 81 83  Resp:  16 15 17   Temp:      TempSrc:      SpO2:  99% 97% 96%  Weight:      Height:      PainSc: 6        Isolation Precautions No active isolations  Medications Medications  sodium chloride (PF) 0.9 % injection (has no administration in time range)  fentaNYL (SUBLIMAZE) injection 50 mcg (50 mcg Intravenous Given  01/20/19 0646)  heparin injection 5,000 Units (has no administration in time range)  sodium chloride 0.9 % bolus 1,000 mL (0 mLs Intravenous Stopped 01/20/19 0538)  ondansetron (ZOFRAN) injection 4 mg (4 mg Intravenous Given 01/20/19 0356)  fentaNYL (SUBLIMAZE) injection 50 mcg (50 mcg Intravenous Given 01/20/19 0356)  iohexol (OMNIPAQUE) 300 MG/ML solution 100 mL (80 mLs Intravenous Contrast Given 01/20/19 0520)  fentaNYL (SUBLIMAZE) injection 50 mcg (50 mcg Intravenous Given 01/20/19 0544)    Mobility walks with person assist Low fall risk   Focused Assessments    R Recommendations: See Admitting Provider Note  Report given to: Museum/gallery conservator, RN 4E  Additional Notes:

## 2019-01-20 NOTE — ED Notes (Signed)
Walked pt to bathroom for sample

## 2019-01-20 NOTE — Consult Note (Addendum)
Reason for Consult: Pancreatitis Referring Physician: Hospital team  Deborah Travis is an 78 y.o. female.  HPI: Patient seen and examined in our office computer chart and her hospital computer chart was reviewed and her CT was discussed with radiology and she has not had a bout of pancreatitis in a while but woke up yesterday not feeling well but after eating at a picnic had increased abdominal pain and presented to the emergency room and she had a Whipple procedure for chronic pancreatitis years ago and has had some diarrhea and weight loss which recently was helped with pancreatic enzymes and a recent colonoscopy by my partner Dr. Penelope Coop was reviewed and she had a CT scan in December and her current CT shows slightly more dilated pancreatic duct with possibly increased stones in the duct and mild tail pancreatitis and she is feeling better overall and does want to try to drink something and her family history is negative for any pancreatitis and she has quit drinking but she is not sure if that is what caused her pancreatitis and she has no other complaints  Past Medical History:  Diagnosis Date  . Chronic kidney disease    RENAL STONE  . Chronic pancreatitis ([place])   . Diabetes mellitus   . Duodenal ulcer   . Dyslipidemia   . GERD (gastroesophageal reflux disease)   . Hypertension   . Osteopenia   . Thyroid disease    HYPOTHYROID  . Vitamin D deficiency     Past Surgical History:  Procedure Laterality Date  . APPENDECTOMY    . CATARACT EXTRACTION Right 2014  . CHOLECYSTECTOMY    . TONSILECTOMY, ADENOIDECTOMY, BILATERAL MYRINGOTOMY AND TUBES    . WHIPPLE PROCEDURE  1998    Family History  Problem Relation Age of Onset  . Stroke Mother   . Heart disease Father   . Healthy Daughter   . Healthy Daughter     Social History:  reports that she quit smoking about 3 years ago. She smoked 0.10 packs per day. She has never used smokeless tobacco. She reports that she does not drink  alcohol or use drugs.  Allergies:  Allergies  Allergen Reactions  . Levofloxacin     Muscle spasms and tightness   . Codeine Itching  . Morphine And Related Itching    Medications: I have reviewed the patient's current medications.  Results for orders placed or performed during the hospital encounter of 01/20/19 (from the past 48 hour(s))  CBC with Differential     Status: Abnormal   Collection Time: 01/20/19  2:56 AM  Result Value Ref Range   WBC 11.5 (H) 4.0 - 10.5 K/uL   RBC 4.18 3.87 - 5.11 MIL/uL   Hemoglobin 13.3 12.0 - 15.0 g/dL   HCT 39.1 36.0 - 46.0 %   MCV 93.5 80.0 - 100.0 fL   MCH 31.8 26.0 - 34.0 pg   MCHC 34.0 30.0 - 36.0 g/dL   RDW 14.1 11.5 - 15.5 %   Platelets 254 150 - 400 K/uL   nRBC 0.0 0.0 - 0.2 %   Neutrophils Relative % 82 %   Neutro Abs 9.5 (H) 1.7 - 7.7 K/uL   Lymphocytes Relative 12 %   Lymphs Abs 1.3 0.7 - 4.0 K/uL   Monocytes Relative 6 %   Monocytes Absolute 0.7 0.1 - 1.0 K/uL   Eosinophils Relative 0 %   Eosinophils Absolute 0.0 0.0 - 0.5 K/uL   Basophils Relative 0 %  Basophils Absolute 0.0 0.0 - 0.1 K/uL   Immature Granulocytes 0 %   Abs Immature Granulocytes 0.03 0.00 - 0.07 K/uL    Comment: Performed at [facility], [place] [address] [place], [place] [address]  Comprehensive metabolic panel     Status: Abnormal   Collection Time: 01/20/19  2:56 AM  Result Value Ref Range   Sodium 136 135 - 145 mmol/L   Potassium 4.1 3.5 - 5.1 mmol/L   Chloride 105 98 - 111 mmol/L   CO2 20 (L) 22 - 32 mmol/L   Glucose, Bld 223 (H) 70 - 99 mg/dL   BUN 22 8 - 23 mg/dL   Creatinine, Ser 1.09 (H) 0.44 - 1.00 mg/dL   Calcium 8.9 8.9 - 10.3 mg/dL   Total Protein 7.7 6.5 - 8.1 g/dL   Albumin 4.5 3.5 - 5.0 g/dL   AST 41 15 - 41 U/L   ALT 48 (H) 0 - 44 U/L   Alkaline Phosphatase 113 38 - 126 U/L   Total Bilirubin 0.6 0.3 - 1.2 mg/dL   GFR calc non Af Amer 50 (L) >60 mL/min   GFR calc Af Amer 58 (L) >60 mL/min   Anion gap 11 5 - 15     Comment: Performed at [facility], [place] [address], [place], [place] [address]  Lipase, blood     Status: None   Collection Time: 01/20/19  2:56 AM  Result Value Ref Range   Lipase 16 11 - 51 U/L    Comment: Performed at [facility], [place] [address], [place], [place] [address]  Urinalysis, Routine w reflex microscopic     Status: Abnormal   Collection Time: 01/20/19  2:56 AM  Result Value Ref Range   Color, Urine YELLOW YELLOW   APPearance CLEAR CLEAR   Specific Gravity, Urine 1.016 1.005 - 1.030   pH 5.0 5.0 - 8.0   Glucose, UA 150 (A) NEGATIVE mg/dL   Hgb urine dipstick NEGATIVE NEGATIVE   Bilirubin Urine NEGATIVE NEGATIVE   Ketones, ur 20 (A) NEGATIVE mg/dL   Protein, ur NEGATIVE NEGATIVE mg/dL   Nitrite NEGATIVE NEGATIVE   Leukocytes,Ua NEGATIVE NEGATIVE    Comment: Performed at [place] [address] [place], [place] [address]  SARS Coronavirus 2 (CEPHEID - Performed in [place] hospital lab), Hosp Order     Status: None   Collection Time: 01/20/19  8:12 AM  Result Value Ref Range   SARS Coronavirus 2 NEGATIVE NEGATIVE    Comment: (NOTE) If result is NEGATIVE SARS-CoV-2 target nucleic acids are NOT DETECTED. The SARS-CoV-2 RNA is generally detectable in upper and lower  respiratory specimens during the acute phase of infection. The lowest  concentration of SARS-CoV-2 viral copies this assay can detect is 250  copies / mL. A negative result does not preclude SARS-CoV-2 infection  and should not be used as the sole basis for treatment or other  patient management decisions.  A negative result may occur with  improper specimen collection / handling, submission of specimen other  than nasopharyngeal swab, presence of viral mutation(s) within the  areas targeted by this assay, and inadequate number of viral copies  (<250 copies / mL). A negative result must be combined with clinical  observations, patient  history, and epidemiological information. If result is POSITIVE SARS-CoV-2 target nucleic acids are DETECTED. The SARS-CoV-2 RNA is generally detectable in upper and lower  respiratory specimens dur ing the acute phase  of infection.  Positive  results are indicative of active infection with SARS-CoV-2.  Clinical  correlation with patient history and other diagnostic information is  necessary to determine patient infection status.  Positive results do  not rule out bacterial infection or co-infection with other viruses. If result is PRESUMPTIVE POSTIVE SARS-CoV-2 nucleic acids MAY BE PRESENT.   A presumptive positive result was obtained on the submitted specimen  and confirmed on repeat testing.  While 2019 novel coronavirus  (SARS-CoV-2) nucleic acids may be present in the submitted sample  additional confirmatory testing may be necessary for epidemiological  and / or clinical management purposes  to differentiate between  SARS-CoV-2 and other Sarbecovirus currently known to infect humans.  If clinically indicated additional testing with an alternate test  methodology [phone]) is advised. The SARS-CoV-2 RNA is generally  detectable in upper and lower respiratory sp ecimens during the acute  phase of infection. The expected result is Negative. Fact Sheet for Patients:  StrictlyIdeas.no Fact Sheet for Healthcare Providers: BankingDealers.co.za This test is not yet approved or cleared by the [place] FDA and has been authorized for detection and/or diagnosis of SARS-CoV-2 by FDA under an Emergency Use Authorization (EUA).  This EUA will remain in effect (meaning this test can be used) for the duration of the COVID-19 declaration under Section 564(b)(1) of the Act, 21 U.S.C. section 360bbb-3(b)(1), unless the authorization is terminated or revoked sooner. Performed at [facility], [place] [address] [place],  [place] [address]     Ct Abdomen Pelvis W Contrast  Result Date: 01/20/2019 CLINICAL DATA:  Acute generalized abdominal pain EXAM: CT ABDOMEN AND PELVIS WITH CONTRAST TECHNIQUE: Multidetector CT imaging of the abdomen and pelvis was performed using the standard protocol following bolus administration of intravenous contrast. CONTRAST:  2mL OMNIPAQUE IOHEXOL 300 MG/ML  SOLN COMPARISON:  08/07/2018 FINDINGS: Lower chest:  No contributory findings. Hepatobiliary: Fat deposition about the falciform ligament. Small subcapsular right hepatic cyst.Pneumobilia after Whipple procedure. Pancreas: Whipple procedure with chronic pancreatitis showing atrophy and coarse calcifications. The main duct is chronically dilated and there is ductal calculi with more proximal parenchymal edema and peripancreatic fat stranding. No abscess or collection. Spleen: Unremarkable. Adrenals/Urinary Tract: Negative adrenals. Renal cysts and scarring. Unremarkable bladder. Stomach/Bowel:  No obstruction. No appendicitis. Vascular/Lymphatic: No acute vascular abnormality. Atherosclerosis. No mass or adenopathy. Reproductive:No pathologic findings. Other: No ascites or pneumoperitoneum. Musculoskeletal: No acute abnormalities. L5-S1 advanced disc degeneration. IMPRESSION: Acute pancreatitis proximal to pancreatic duct calculi. There is a background of chronic pancreatitis after Whipple procedure. Electronically Signed   By: Monte Fantasia M.D.   On: 01/20/2019 05:42    ROS Blood pressure (!) 182/98, pulse (!) 113, temperature 98.2 F (36.8 C), temperature source Oral, resp. rate 16, height 5\' 3"  (1.6 m), weight 47.6 kg, SpO2 95 %. Physical Exam patient sitting comfortably in the bed no acute distress exam pertinent for her abdomen being surprisingly soft nontender occasional bowel sounds labs and CT reviewed as above Assessment/Plan: Chronic pancreatitis with acute seemingly mild flare questionably related to pancreatic duct stones Plan:  Dealing with pancreatic duct stones is very difficult and probably particularly hard in a patient with a Whipple procedure and the risk of increasing pancreatitis during instrumentation of the pancreas is rather high and it might be best to treat her symptomatically and see if she has multiple recurrences before proceeding with work-up and she might need a university consult down the road prior to proceeding.  Radiology did not think  an MRCP would shed any more light on the situation and will allow clear liquids and resume pancreatic enzymes  Deborah Travis E 01/20/2019, 10:45 AM

## 2019-01-20 NOTE — ED Triage Notes (Signed)
Patient is from home and transported via [facility]. Patient is complaining of left lower quad pain. Associated symptoms of nausea and vomiting. Patient reported to EMS that her symptoms started after she went to a cookout.

## 2019-01-20 NOTE — H&P (Signed)
History and Physical    Deborah Travis:[id number] DOB: 1940-11-01 DOA: 01/20/2019  PCP: [name], MD   Patient coming from: Home   Chief Complaint: nausea, vomiting, abdominal pain  HPI: Deborah Travis is a 78 y.o. female with medical history significant of rheumatoid arthritis, diabetes mellitus, hypertension, hyperlipidemia, hypothyroidism, CKD stage II, status post Whipple's procedure who presents to the emergency department from home with complaints of abdomen pain, nausea and vomiting.  Patient lives alone with her dog.  Yesterday she was in her daughter's house where they had a cookout with family to celebrate Memorial Day.  After having a meal, she started feeling bad.  Her stomach started to hurt.  She became nauseated .  She had to go to the bathroom and threw up 4 times.  Patient did not feel good and decided to return to her home.  On the way, she had to pull over 2 times because she was nauseated and vomiting.  She then presented to the emergency department. Patient seen and examined the bedside in the emergency department.  Currently she is hemodynamically stable, mildly hypertensive.  Nausea and vomiting have been better.  Abdominal pain has improved with IV pain meds.  She was having epigastric pain earlier with radiation to the left subcostal region. She says she had some chills .  She was having mild cough since last November when she was diagnosed with pneumonia.  She denies any fever, chest pain, shortness of breath, palpitations, lower abdominal pain, diarrhea, melena or hematochezia.  Denies any contact with COVID-19 cases.  ED Course: CT abdomen/pelvis done in the emergency department showed acute pancreatitis, pancreatic duct calculi.  Started on IV fluids, analgesics.  GI consulted.   Past Medical History:  Diagnosis Date   Chronic kidney disease    RENAL STONE   Chronic pancreatitis ([place])    Diabetes mellitus    Duodenal ulcer    Dyslipidemia     GERD (gastroesophageal reflux disease)    Hypertension    Osteopenia    Thyroid disease    HYPOTHYROID   Vitamin D deficiency     Past Surgical History:  Procedure Laterality Date   APPENDECTOMY     CATARACT EXTRACTION Right 2014   CHOLECYSTECTOMY     TONSILECTOMY, ADENOIDECTOMY, BILATERAL MYRINGOTOMY AND TUBES     WHIPPLE PROCEDURE  1998     reports that she quit smoking about 3 years ago. She smoked 0.10 packs per day. She has never used smokeless tobacco. She reports that she does not drink alcohol or use drugs.  Allergies  Allergen Reactions   Levofloxacin     Muscle spasms and tightness    Codeine Itching   Morphine And Related Itching    Family History  Problem Relation Age of Onset   Stroke Mother    Heart disease Father    Healthy Daughter    Healthy Daughter      Prior to Admission medications   Medication Sig Start Date End Date Taking? Authorizing Provider  b complex vitamins capsule Take 1 capsule by mouth daily.   Yes [provider]  cholecalciferol (VITAMIN D3) 25 MCG (1000 UT) tablet Take 1,000 Units by mouth 2 (two) times a day.   Yes [provider]  Coenzyme Q10 (CO Q 10 PO) Take 1 tablet by mouth daily.    Yes [provider]  famotidine (PEPCID) 10 MG tablet Take 10 mg by mouth 3 (three) times daily.   Yes  [provider]  leflunomide (ARAVA) 20 MG tablet Take 1 tablet (20 mg total) by mouth daily. 12/31/18  Yes Deveshwar, Abel Presto, MD  levothyroxine (SYNTHROID) 100 MCG tablet Take 1 tablet (100 mcg total) by mouth daily. 12/29/18  Yes [name], MD  lisinopril (ZESTRIL) 10 MG tablet Take 1 tablet (10 mg total) by mouth daily. 12/29/18  Yes [name], MD  metFORMIN (GLUCOPHAGE) 1000 MG tablet One twice a day Patient taking differently: Take 1,000 mg by mouth 2 (two) times daily with a meal.  12/29/18  Yes [name], MD  Multiple Vitamin (MULITIVITAMIN WITH MINERALS) TABS Take 1 tablet by  mouth daily.   Yes [provider]  pravastatin (PRAVACHOL) 20 MG tablet Take 1 tablet (20 mg total) by mouth daily. 12/29/18  Yes [name], MD  VITAMIN A PO Take 1 tablet by mouth daily.    Yes [provider]  VITAMIN E COMPLEX PO Take 1 capsule by mouth daily.    Yes [provider]  Zinc Sulfate (ZINC 15 PO) Take 15 mg by mouth daily.   Yes [provider]  ACCU-CHEK FASTCLIX LANCETS MISC 1 Device by Does not apply route 2 (two) times daily. 02/20/18   [name], MD  Alcohol Swabs (B-D SINGLE USE SWABS BUTTERFLY) PADS 1 each by Does not apply route 2 (two) times daily. 09/21/14   [name], MD  Blood Glucose Calibration (ACCU-CHEK AVIVA) SOLN Use as directed 09/21/14   [name], MD  Blood Glucose Monitoring Suppl (ACCU-CHEK AVIVA PLUS) w/Device KIT Use as directed Patient is to test BID DX E11.9 02/24/18   [name], MD  glucose blood test strip THIS IS FOR Accucheck Aviva Plus Use as instructed PATIENT IS TO TEST ONE TIME A DAY DX: E11.9 04/04/18   [name], MD    Physical Exam: Vitals:   01/20/19 0500 01/20/19 0630 01/20/19 0709 01/20/19 0730  BP: (!) 170/105 (!) 183/97 (!) 164/92 (!) 176/96  Pulse: 92 82 81 83  Resp: (!) 21 16 15 17   Temp:      TempSrc:      SpO2: 100% 99% 97% 96%  Weight:      Height:        Constitutional: NAD, calm, comfortable Vitals:   01/20/19 0500 01/20/19 0630 01/20/19 0709 01/20/19 0730  BP: (!) 170/105 (!) 183/97 (!) 164/92 (!) 176/96  Pulse: 92 82 81 83  Resp: (!) 21 16 15 17   Temp:      TempSrc:      SpO2: 100% 99% 97% 96%  Weight:      Height:       Eyes: PERRL, lids and conjunctivae normal ENMT: Mucous membranes are moist. Posterior pharynx clear of any exudate or lesions.Normal dentition.  Neck: normal, supple, no masses, no thyromegaly Respiratory: clear to auscultation bilaterally, no wheezing, no crackles. Normal respiratory effort. No accessory muscle use.    Cardiovascular: Regular rate and rhythm, no murmurs / rubs / gallops. No extremity edema. 2+ pedal pulses. No carotid bruits.  Abdomen: Epigastric tenderness, no masses palpated. No hepatosplenomegaly. Bowel sounds positive.  Musculoskeletal: no clubbing / cyanosis. No joint deformity upper and lower extremities. Good ROM, no contractures. Normal muscle tone.  Skin: Senile purpura Neurologic: CN 2-12 grossly intact. Sensation intact, DTR normal. Strength 5/5 in all 4.  Psychiatric: Normal judgment and insight. Alert and oriented x 3. Normal mood.   Foley Catheter:None  Labs on  Admission: I have personally reviewed following labs and imaging studies  CBC: Recent Labs  Lab 01/20/19 0256  WBC 11.5*  NEUTROABS 9.5*  HGB 13.3  HCT 39.1  MCV 93.5  PLT 034   Basic Metabolic Panel: Recent Labs  Lab 01/20/19 0256  NA 136  K 4.1  CL 105  CO2 20*  GLUCOSE 223*  BUN 22  CREATININE 1.09*  CALCIUM 8.9   GFR: Estimated Creatinine Clearance: 33.5 mL/min (A) (by C-G formula based on SCr of 1.09 mg/dL (H)). Liver Function Tests: Recent Labs  Lab 01/20/19 0256  AST 41  ALT 48*  ALKPHOS 113  BILITOT 0.6  PROT 7.7  ALBUMIN 4.5   Recent Labs  Lab 01/20/19 0256  LIPASE 16   No results for input(s): AMMONIA in the last 168 hours. Coagulation Profile: No results for input(s): INR, PROTIME in the last 168 hours. Cardiac Enzymes: No results for input(s): CKTOTAL, CKMB, CKMBINDEX, TROPONINI in the last 168 hours. BNP (last 3 results) No results for input(s): PROBNP in the last 8760 hours. HbA1C: No results for input(s): HGBA1C in the last 72 hours. CBG: No results for input(s): GLUCAP in the last 168 hours. Lipid Profile: No results for input(s): CHOL, HDL, LDLCALC, TRIG, CHOLHDL, LDLDIRECT in the last 72 hours. Thyroid Function Tests: No results for input(s): TSH, T4TOTAL, FREET4, T3FREE, THYROIDAB in the last 72 hours. Anemia Panel: No results for input(s): VITAMINB12,  FOLATE, FERRITIN, TIBC, IRON, RETICCTPCT in the last 72 hours. Urine analysis:    Component Value Date/Time   COLORURINE YELLOW 01/20/2019 0256   APPEARANCEUR CLEAR 01/20/2019 0256   LABSPEC 1.016 01/20/2019 0256   PHURINE 5.0 01/20/2019 0256   GLUCOSEU 150 (A) 01/20/2019 0256   HGBUR NEGATIVE 01/20/2019 0256   BILIRUBINUR NEGATIVE 01/20/2019 0256   KETONESUR 20 (A) 01/20/2019 0256   PROTEINUR NEGATIVE 01/20/2019 0256   UROBILINOGEN 0.2 12/20/2010 0957   NITRITE NEGATIVE 01/20/2019 0256   LEUKOCYTESUR NEGATIVE 01/20/2019 0256    Radiological Exams on Admission: Ct Abdomen Pelvis W Contrast  Result Date: 01/20/2019 CLINICAL DATA:  Acute generalized abdominal pain EXAM: CT ABDOMEN AND PELVIS WITH CONTRAST TECHNIQUE: Multidetector CT imaging of the abdomen and pelvis was performed using the standard protocol following bolus administration of intravenous contrast. CONTRAST:  73m OMNIPAQUE IOHEXOL 300 MG/ML  SOLN COMPARISON:  08/07/2018 FINDINGS: Lower chest:  No contributory findings. Hepatobiliary: Fat deposition about the falciform ligament. Small subcapsular right hepatic cyst.Pneumobilia after Whipple procedure. Pancreas: Whipple procedure with chronic pancreatitis showing atrophy and coarse calcifications. The main duct is chronically dilated and there is ductal calculi with more proximal parenchymal edema and peripancreatic fat stranding. No abscess or collection. Spleen: Unremarkable. Adrenals/Urinary Tract: Negative adrenals. Renal cysts and scarring. Unremarkable bladder. Stomach/Bowel:  No obstruction. No appendicitis. Vascular/Lymphatic: No acute vascular abnormality. Atherosclerosis. No mass or adenopathy. Reproductive:No pathologic findings. Other: No ascites or pneumoperitoneum. Musculoskeletal: No acute abnormalities. L5-S1 advanced disc degeneration. IMPRESSION: Acute pancreatitis proximal to pancreatic duct calculi. There is a background of chronic pancreatitis after Whipple  procedure. Electronically Signed   By: JMonte FantasiaM.D.   On: 01/20/2019 05:42     Assessment/Plan Principal Problem:   Acute pancreatitis Active Problems:   Type 2 diabetes with complication (HCC)   Hypertension associated with diabetes ([place]   Hyperlipidemia associated with type 2 diabetes mellitus (HCC)   Hypothyroidism   [place]ative [place] arthritis [place]   Abdominal pain  Acute pancreatitis:CT showed acute pancreatitis proximal to pancreatic duct calculi,background of chronic pancreatitis  after Whipple procedure. N.p.o.  Continue IV fluids, pain medications, antiemetics. No elevation in liver enzymes.  Lipase normal. GI has been consulted.  She follows with [facility] gastroenterology. She has history of Whipple's procedure done about 22 years ago for chronic abdominal pain which resulted in chronic pancreatic insufficiency.She is not on pancreatic supplements at home.  Hypertension: Mildly hypertensive in the emergency department.  On antihypertensives at home . We will also continue PRN meds.  Hyperlipidemia: On statin at home  Diabetes mellitus: On metformin at home.  Last hemoglobin A1C of 7.8.Continue sliding scale insulin here.  Hypothyroidism:On Synthyroid.  Seronegative rheumatoid arthritis: Follows with rheumatology.  On Arava at home  CKD stage II: Currently kidney function on baseline.     Severity of Illness: The appropriate patient status for this patient is INPATIENT.  She has acute medical illness with pancreatic duct calculi.  She needs gastroenterological intervention, pain medications.  Needs to be monitored very closely for  Decompensation.  DVT prophylaxis: Hep [place] Code Status: Full Family Communication:None present at the bedside Consults called: Gastroenterology     Shelly Coss MD [facility] Pager [phone]  If 7PM-7AM, please contact night-coverage [email or web address] Password [facility]  01/20/2019, 7:36 AM

## 2019-01-20 NOTE — ED Notes (Signed)
Bed: VH84 Expected date:  Expected time:  Means of arrival:  Comments: 77 yo F/ Abd pain

## 2019-01-20 NOTE — ED Notes (Signed)
Attempted report x1. 

## 2019-01-20 NOTE — Care Management (Signed)
This is a no charge note  Pending admission per Dr. Dina Rich  78 year old lady with past medical history of Whipple procedure 1998, cholecystectomy, hypertension, hyperlipidemia, diabetes mellitus, GERD, hypothyroidism, chronic pancreatitis, duodenal ulcer, kidney stone, who presents with abdominal pain, nausea and vomiting.  Patient was found to have lipase of 16, WBC 11.5, liver function (ALP 113, AST 41, ALT 48, total bilirubin 0.6), negative urinalysis.  CT abdomen/pelvis showed acute pancreatitis proximal to pancreatic duct calculi, with background of chronic pancreatitis after Whipple procedure. Pt is place in med-surg bed for obs. EDP will consult [facility] (I asked Dr. Dina Rich to document GI doctor's name).   Please call manager of [facility] at [phone] when pt arrives to floor   Ivor Costa, MD  [facility]   If 7PM-7AM, please contact night-coverage [email or web address] Password [email or web address] 01/20/2019, 6:10 AM

## 2019-01-20 NOTE — ED Notes (Signed)
Pt was advised a urine sample was needed.

## 2019-01-20 NOTE — ED Provider Notes (Addendum)
[facility] DEPT Provider Note   CSN: [id number] Arrival date & time: 01/20/19  2542    History   Chief Complaint Chief Complaint  Patient presents with  . Abdominal Pain    HPI Deborah Travis is a 78 y.o. female.     HPI  This is a 78 year old female with a history of chronic kidney disease, pancreatitis, diabetes, ulcer who presents with abdominal pain, nausea, vomiting.  Patient reports that she was at cookout with family today she had onset of nausea and vomiting.  She reports multiple episodes of emesis since that time.  Emesis has turned "green."  She reports crampy burning epigastric and left upper abdominal pain that radiates to the back and chest.  She denies any diarrhea or constipation.  She rates her pain at 10 out of 10.  She is not taking anything for the pain.  She has never had pain like this before.  She denies fevers or urinary symptoms.  No known sick contacts and no one else is sick that she knows of.  Past Medical History:  Diagnosis Date  . Chronic kidney disease    RENAL STONE  . Chronic pancreatitis ([place])   . Diabetes mellitus   . Duodenal ulcer   . Dyslipidemia   . GERD (gastroesophageal reflux disease)   . Hypertension   . Osteopenia   . Thyroid disease    HYPOTHYROID  . Vitamin D deficiency     Patient Active Problem List   Diagnosis Date Noted  . Atherosclerosis 08/08/2018  . Glaucoma 05/26/2018  . Hypertensive retinopathy 05/26/2018  . Seronegative rheumatoid arthritis ([place]) 03/04/2018  . Senile purpura ([place]) 02/01/2015  . Former smoker 10/12/2013  . History of peptic ulcer disease 07/13/2013  . Osteopenia 05/07/2013  . Hypothyroidism 01/12/2012  . Type 2 diabetes with complication ([place]) 70/62/3762  . Hypertension associated with diabetes ([place]) 05/21/2011  . Exocrine pancreatic insufficiency 05/21/2011  . Hyperlipidemia associated with type 2 diabetes mellitus ([place]) 05/21/2011    Past Surgical History:   Procedure Laterality Date  . APPENDECTOMY    . CATARACT EXTRACTION Right 2014  . CHOLECYSTECTOMY    . TONSILECTOMY, ADENOIDECTOMY, BILATERAL MYRINGOTOMY AND TUBES    . WHIPPLE PROCEDURE  1998     OB History   No obstetric history on file.      Home Medications    Prior to Admission medications   Medication Sig Start Date End Date Taking? Authorizing Provider  b complex vitamins capsule Take 1 capsule by mouth daily.   Yes [provider]  cholecalciferol (VITAMIN D3) 25 MCG (1000 UT) tablet Take 1,000 Units by mouth 2 (two) times a day.   Yes [provider]  Coenzyme Q10 (CO Q 10 PO) Take 1 tablet by mouth daily.    Yes [provider]  famotidine (PEPCID) 10 MG tablet Take 10 mg by mouth 3 (three) times daily.   Yes [provider]  leflunomide (ARAVA) 20 MG tablet Take 1 tablet (20 mg total) by mouth daily. 12/31/18  Yes Deveshwar, Abel Presto, MD  levothyroxine (SYNTHROID) 100 MCG tablet Take 1 tablet (100 mcg total) by mouth daily. 12/29/18  Yes [name], MD  lisinopril (ZESTRIL) 10 MG tablet Take 1 tablet (10 mg total) by mouth daily. 12/29/18  Yes [name], MD  metFORMIN (GLUCOPHAGE) 1000 MG tablet One twice a day Patient taking differently: Take 1,000 mg by mouth 2 (two) times daily with a meal.  12/29/18  Yes [name], MD  Multiple Vitamin (MULITIVITAMIN WITH MINERALS) TABS Take 1 tablet by mouth daily.   Yes [provider]  pravastatin (PRAVACHOL) 20 MG tablet Take 1 tablet (20 mg total) by mouth daily. 12/29/18  Yes [name], MD  VITAMIN A PO Take 1 tablet by mouth daily.    Yes [provider]  VITAMIN E COMPLEX PO Take 1 capsule by mouth daily.    Yes [provider]  Zinc Sulfate (ZINC 15 PO) Take 15 mg by mouth daily.   Yes [provider]  ACCU-CHEK FASTCLIX LANCETS MISC 1 Device by Does not apply route 2 (two) times daily. 02/20/18   [name], MD  Alcohol Swabs (B-D SINGLE  USE SWABS BUTTERFLY) PADS 1 each by Does not apply route 2 (two) times daily. 09/21/14   [name], MD  Blood Glucose Calibration (ACCU-CHEK AVIVA) SOLN Use as directed 09/21/14   [name], MD  Blood Glucose Monitoring Suppl (ACCU-CHEK AVIVA PLUS) w/Device KIT Use as directed Patient is to test BID DX E11.9 02/24/18   [name], MD  glucose blood test strip THIS IS FOR Accucheck Aviva Plus Use as instructed PATIENT IS TO TEST ONE TIME A DAY DX: E11.9 04/04/18   [name], MD    Family History Family History  Problem Relation Age of Onset  . Stroke Mother   . Heart disease Father   . Healthy Daughter   . Healthy Daughter     Social History Social History   Tobacco Use  . Smoking status: Former Smoker    Packs/day: 0.10    Last attempt to quit: 10/31/2015    Years since quitting: 3.2  . Smokeless tobacco: Never Used  Substance Use Topics  . Alcohol use: No    Alcohol/week: 0.0 standard drinks  . Drug use: No     Allergies   Levofloxacin; Codeine; and Morphine and related   Review of Systems Review of Systems  Constitutional: Negative for fever.  Respiratory: Negative for shortness of breath.   Cardiovascular: Negative for chest pain.  Gastrointestinal: Positive for abdominal pain, nausea and vomiting. Negative for constipation and diarrhea.  Genitourinary: Negative for dysuria.  Neurological: Negative for weakness and numbness.  All other systems reviewed and are negative.    Physical Exam Updated Vital Signs BP (!) 170/105   Pulse 92   Temp 98.6 F (37 C) (Oral)   Resp (!) 21   Ht 1.6 m (5' 3" )   Wt 47.6 kg   SpO2 100%   BMI 18.60 kg/m   Physical Exam Vitals signs and nursing note reviewed.  Constitutional:      Appearance: She is not ill-appearing.     Comments: Actively retching  HENT:     Head: Normocephalic and atraumatic.  Neck:     Musculoskeletal: Neck supple.  Cardiovascular:     Rate and Rhythm: Normal rate and regular  rhythm.     Heart sounds: Normal heart sounds.  Pulmonary:     Effort: Pulmonary effort is normal. No respiratory distress.     Breath sounds: No wheezing.  Abdominal:     General: Bowel sounds are normal.     Palpations: Abdomen is soft.     Tenderness: There is abdominal tenderness in the epigastric area. There is no guarding or rebound.  Skin:    General: Skin is warm and dry.  Neurological:     Mental Status: She is alert  and oriented to person, place, and time.  Psychiatric:        Mood and Affect: Mood normal.      ED Treatments / Results  Labs (all labs ordered are listed, but only abnormal results are displayed) Labs Reviewed  CBC WITH DIFFERENTIAL/PLATELET - Abnormal; Notable for the following components:      Result Value   WBC 11.5 (*)    Neutro Abs 9.5 (*)    All other components within normal limits  COMPREHENSIVE METABOLIC PANEL - Abnormal; Notable for the following components:   CO2 20 (*)    Glucose, Bld 223 (*)    Creatinine, Ser 1.09 (*)    ALT 48 (*)    GFR calc non Af Amer 50 (*)    GFR calc Af Amer 58 (*)    All other components within normal limits  URINALYSIS, ROUTINE W REFLEX MICROSCOPIC - Abnormal; Notable for the following components:   Glucose, UA 150 (*)    Ketones, ur 20 (*)    All other components within normal limits  LIPASE, BLOOD    EKG EKG Interpretation  Date/Time:  Tuesday Jan 20 2019 03:06:07 EDT Ventricular Rate:  87 PR Interval:    QRS Duration: 87 QT Interval:  380 QTC Calculation: 458 R Axis:   -36 Text Interpretation:  Sinus rhythm Left axis deviation Low voltage, precordial leads Confirmed by Thayer Jew [phone]) on 01/20/2019 3:29:32 AM   Radiology Ct Abdomen Pelvis W Contrast  Result Date: 01/20/2019 CLINICAL DATA:  Acute generalized abdominal pain EXAM: CT ABDOMEN AND PELVIS WITH CONTRAST TECHNIQUE: Multidetector CT imaging of the abdomen and pelvis was performed using the standard protocol following bolus  administration of intravenous contrast. CONTRAST:  71m OMNIPAQUE IOHEXOL 300 MG/ML  SOLN COMPARISON:  08/07/2018 FINDINGS: Lower chest:  No contributory findings. Hepatobiliary: Fat deposition about the falciform ligament. Small subcapsular right hepatic cyst.Pneumobilia after Whipple procedure. Pancreas: Whipple procedure with chronic pancreatitis showing atrophy and coarse calcifications. The main duct is chronically dilated and there is ductal calculi with more proximal parenchymal edema and peripancreatic fat stranding. No abscess or collection. Spleen: Unremarkable. Adrenals/Urinary Tract: Negative adrenals. Renal cysts and scarring. Unremarkable bladder. Stomach/Bowel:  No obstruction. No appendicitis. Vascular/Lymphatic: No acute vascular abnormality. Atherosclerosis. No mass or adenopathy. Reproductive:No pathologic findings. Other: No ascites or pneumoperitoneum. Musculoskeletal: No acute abnormalities. L5-S1 advanced disc degeneration. IMPRESSION: Acute pancreatitis proximal to pancreatic duct calculi. There is a background of chronic pancreatitis after Whipple procedure. Electronically Signed   By: JMonte FantasiaM.D.   On: 01/20/2019 05:42    Procedures Procedures (including critical care time)  Medications Ordered in ED Medications  sodium chloride (PF) 0.9 % injection (has no administration in time range)  fentaNYL (SUBLIMAZE) injection 50 mcg (has no administration in time range)  sodium chloride 0.9 % bolus 1,000 mL (0 mLs Intravenous Stopped 01/20/19 0538)  ondansetron (ZOFRAN) injection 4 mg (4 mg Intravenous Given 01/20/19 0356)  fentaNYL (SUBLIMAZE) injection 50 mcg (50 mcg Intravenous Given 01/20/19 0356)  iohexol (OMNIPAQUE) 300 MG/ML solution 100 mL (80 mLs Intravenous Contrast Given 01/20/19 0520)  fentaNYL (SUBLIMAZE) injection 50 mcg (50 mcg Intravenous Given 01/20/19 0544)     Initial Impression / Assessment and Plan / ED Course  I have reviewed the triage vital signs and  the nursing notes.  Pertinent labs & imaging results that were available during my care of the patient were reviewed by me and considered in my medical decision making (see chart for details).  Patient presents with upper abdominal pain, nausea, vomiting.  She is actively retching on initial exam.  She is otherwise nontoxic-appearing.  She is tender to palpation in the epigastrium.  Vital signs notable for blood pressure of 170/105.  She was given pain and nausea medication as well as fluids.  Basic lab work obtained.  Basic lab work is largely reassuring.  She does have a slight leukocytosis to 11.5.  Lipase and LFTs are reassuring.  CT scan was obtained.  CT is concerning for pancreatitis and pancreatitolithiasis.  Patient is having ongoing pain.  She was redosed pain medication.  Will consult with gastroenterology.  Her primary GI doctor is Dr. Penelope Coop with [facility].  6:48 AM Was discussed with Dr. Oletta Lamas, GI.  Recommends admission.  She will be evaluated by the GI team.  She may need specialty care and/or surgery.    Final Clinical Impressions(s) / ED Diagnoses   Final diagnoses:  Other chronic pancreatitis [facility])  Pancreatolithiasis    ED Discharge Orders    None       Yazir Koerber, Barbette Hair, MD 01/20/19 0600    Dina Rich Barbette Hair, MD 01/20/19 [phone]

## 2019-01-21 DIAGNOSIS — K858 Other acute pancreatitis without necrosis or infection: Secondary | ICD-10-CM

## 2019-01-21 LAB — CBC
HCT: 36.3 % (ref 36.0–46.0)
Hemoglobin: 11.9 g/dL — ABNORMAL LOW (ref 12.0–15.0)
MCH: 31.6 pg (ref 26.0–34.0)
MCHC: 32.8 g/dL (ref 30.0–36.0)
MCV: 96.5 fL (ref 80.0–100.0)
Platelets: 231 10*3/uL (ref 150–400)
RBC: 3.76 MIL/uL — ABNORMAL LOW (ref 3.87–5.11)
RDW: 14.2 % (ref 11.5–15.5)
WBC: 11.8 10*3/uL — ABNORMAL HIGH (ref 4.0–10.5)
nRBC: 0 % (ref 0.0–0.2)

## 2019-01-21 LAB — COMPREHENSIVE METABOLIC PANEL
ALT: 32 U/L (ref 0–44)
AST: 23 U/L (ref 15–41)
Albumin: 3.3 g/dL — ABNORMAL LOW (ref 3.5–5.0)
Alkaline Phosphatase: 94 U/L (ref 38–126)
Anion gap: 10 (ref 5–15)
BUN: 11 mg/dL (ref 8–23)
CO2: 20 mmol/L — ABNORMAL LOW (ref 22–32)
Calcium: 7.9 mg/dL — ABNORMAL LOW (ref 8.9–10.3)
Chloride: 104 mmol/L (ref 98–111)
Creatinine, Ser: 0.79 mg/dL (ref 0.44–1.00)
GFR calc Af Amer: >60 mL/min (ref >60)
GFR calc non Af Amer: >60 mL/min (ref >60)
Glucose, Bld: 143 mg/dL — ABNORMAL HIGH (ref 70–99)
Potassium: 4.1 mmol/L (ref 3.5–5.1)
Sodium: 134 mmol/L — ABNORMAL LOW (ref 135–145)
Total Bilirubin: 0.5 mg/dL (ref 0.3–1.2)
Total Protein: 6.2 g/dL — ABNORMAL LOW (ref 6.5–8.1)

## 2019-01-21 LAB — GLUCOSE, CAPILLARY: Glucose-Capillary: 117 mg/dL — ABNORMAL HIGH (ref 70–99)

## 2019-01-21 MED ORDER — INSULIN ASPART 100 UNIT/ML ~~LOC~~ SOLN
0.0000 [IU] | Freq: Four times a day (QID) | SUBCUTANEOUS | Status: DC
  Administered 2019-01-22 – 2019-01-23 (×3): 1 [IU] via SUBCUTANEOUS

## 2019-01-21 MED ORDER — MORPHINE SULFATE (PF) 2 MG/ML IV SOLN
2.0000 mg | INTRAVENOUS | Status: DC | PRN
  Administered 2019-01-21 – 2019-01-22 (×8): 2 mg via INTRAVENOUS
  Filled 2019-01-21 (×8): qty 1

## 2019-01-21 NOTE — Progress Notes (Signed)
Patient indicates that her pain is better than it was on admission, but she attributes some of this to the fact she is receiving pain medication.  Her pain is predominantly in the left upper quadrant.  She does state that she is hungry.  On exam, the patient is in absolutely no distress.  Bowel sounds are very active, and there is no epigastric tenderness, perhaps minimal in left upper quadrant.    WBC stable 11.8, hgb down 1 gm c/w overnight hydration.  Lipase remains nl.  LFT's, which were almost normal at time of admission, have come down even farther into the nl range.  Impression: Transient, gradually improving, moderately severe upper abdominal and left upper quadrant pain, presumably due to recurrent pancreatitis superimposed on chronic pancreatitis with documented pancreatic ductal stones.  However, there is neither biochemical nor radiographic confirmation of acute pancreatic inflammation.  Recommendation: I am encouraged by the fact the patient is hungry and has a benign abdominal exam, but I think it would be prudent to continue clear liquids until tomorrow to allow time for more resolution of (presumptive) inflammation.  At that time, we can hopefully advance to a low-fat diet.  Cleotis Nipper, M.D. Pager [phone] If no answer or after 5 PM call [phone]

## 2019-01-21 NOTE — Progress Notes (Signed)
PROGRESS NOTE    Deborah Travis  WEX:[id number] DOB: [birth year] DOA: 01/20/2019 PCP: [name], MD    Brief Narrative:  78 year old female with history of rheumatoid arthritis, diabetes mellitus, hypertension, hyperlipidemia and hypothyroidism, stage II chronic kidney disease, chronic pancreatitis status post Whipple's procedure who presented to the emergency department from home with complaints of abdominal pain nausea vomiting of 1 day.  Patient presented to the emergency room with severe abdominal pain after having a cookout with family to celebrate [employer].  Patient was found to have pancreatic duct dilatation with suspected pancreatic duct stone. CT scan evidence of some acute pancreatitis and pancreatic duct calculi.  Assessment & Plan:   Principal Problem:   Acute pancreatitis Active Problems:   Type 2 diabetes with complication (H[facility])   Hypertension associated with diabetes ([place])   Hyperlipidemia associated with type 2 diabetes mellitus ([facility])   Hypothyroidism   Seronegative rheumatoid arthritis ([facility])   Abdominal pain  Epigastric and right upper quadrant abdominal pain, suspected acute on chronic recurrent pancreatitis: Lipase normal.  Persistent abdomen pain.  Hemodynamically stable. -Continue IV fluids.  Continue adequate pain medications nausea medication.  Keep on clear liquid diet. -Seen by gastroenterology, expecting conservative management. -We will continue to monitor liver functions and renal functions.  Hypertension: Currently stable on home medications.  Hyperlipidemia: On a statin at home.  She will continue.  Type 2 diabetes: Patient is on metformin at home.  Last A1c was 7.8.  Will keep on sliding scale insulin.  Hypothyroidism: On Synthroid.  Seronegative rheumatoid arthritis: Followed by rheumatology.  On Arava at home.  Acute kidney injury on CKD stage II: Kidney functions normal at her usual levels.    DVT prophylaxis: Heparin subcu Code  Status: Full code Family Communication: Discussed with patient's daughter at bedside Disposition Plan: Inpatient   Consultants:   Gastroenterology  Procedures:   None  Antimicrobials:   None   Subjective: Patient was seen and examined at the bedside.  Also discussed with her daughter over the phone.  Patient continues to have epigastric pain that is aggravated on movement.  Denies any nausea vomiting.  She is trying some liquid diet.  No other overnight events.  Objective: Vitals:   01/20/19 1318 01/20/19 2228 01/21/19 0605 01/21/19 0945  BP: (!) 152/83 132/86 132/81 (!) 141/83  Pulse: 76 87 78 77  Resp: 19 18 18 16   Temp: 98.5 F (36.9 C) 97.6 F (36.4 C) 97.8 F (36.6 C) 98.3 F (36.8 C)  TempSrc: Oral Oral Oral Oral  SpO2: 98% 96% 96% 96%  Weight:      Height:        Intake/Output Summary (Last 24 hours) at 01/21/2019 1337 Last data filed at 01/21/2019 0900 Gross per 24 hour  Intake 2178.33 ml  Output --  Net 2178.33 ml   Filed Weights   01/20/19 0250  Weight: 47.6 kg    Examination:  General exam: Appears calm and comfortable, mild discomfort on movement. Respiratory system: Clear to auscultation. Respiratory effort normal. Cardiovascular system: S1 & S2 heard, RRR. No JVD, murmurs, rubs, gallops or clicks. No pedal edema. Gastrointestinal system: Abdomen is nondistended, soft and mild tenderness on epigastrium without residual guarding.  No organomegaly.  Normal bowel sounds heard. Central nervous system: Alert and oriented. No focal neurological deficits. Extremities: Symmetric 5 x 5 power. Skin: No rashes, lesions or ulcers Psychiatry: Judgement and insight appear normal. Mood & affect appropriate.     Data Reviewed: I have  personally reviewed following labs and imaging studies  CBC: Recent Labs  Lab 01/20/19 0256 01/21/19 0353  WBC 11.5* 11.8*  NEUTROABS 9.5*  --   HGB 13.3 11.9*  HCT 39.1 36.3  MCV 93.5 96.5  PLT 254 818   Basic  Metabolic Panel: Recent Labs  Lab 01/20/19 0256 01/21/19 0353  NA 136 134*  K 4.1 4.1  CL 105 104  CO2 20* 20*  GLUCOSE 223* 143*  BUN 22 11  CREATININE 1.09* 0.79  CALCIUM 8.9 7.9*   GFR: Estimated Creatinine Clearance: 45.7 mL/min (by C-G formula based on SCr of 0.79 mg/dL). Liver Function Tests: Recent Labs  Lab 01/20/19 0256 01/21/19 0353  AST 41 23  ALT 48* 32  ALKPHOS 113 94  BILITOT 0.6 0.5  PROT 7.7 6.2*  ALBUMIN 4.5 3.3*   Recent Labs  Lab 01/20/19 0256 01/20/19 1136  LIPASE 16 19   No results for input(s): AMMONIA in the last 168 hours. Coagulation Profile: No results for input(s): INR, PROTIME in the last 168 hours. Cardiac Enzymes: No results for input(s): CKTOTAL, CKMB, CKMBINDEX, TROPONINI in the last 168 hours. BNP (last 3 results) No results for input(s): PROBNP in the last 8760 hours. HbA1C: No results for input(s): HGBA1C in the last 72 hours. CBG: No results for input(s): GLUCAP in the last 168 hours. Lipid Profile: No results for input(s): CHOL, HDL, LDLCALC, TRIG, CHOLHDL, LDLDIRECT in the last 72 hours. Thyroid Function Tests: No results for input(s): TSH, T4TOTAL, FREET4, T3FREE, THYROIDAB in the last 72 hours. Anemia Panel: No results for input(s): VITAMINB12, FOLATE, FERRITIN, TIBC, IRON, RETICCTPCT in the last 72 hours. Sepsis Labs: No results for input(s): PROCALCITON, LATICACIDVEN in the last 168 hours.  Recent Results (from the past 240 hour(s))  SARS Coronavirus 2 (CEPHEID - Performed in [place] hospital lab), Hosp Order     Status: None   Collection Time: 01/20/19  8:12 AM  Result Value Ref Range Status   SARS Coronavirus 2 NEGATIVE NEGATIVE Final    Comment: (NOTE) If result is NEGATIVE SARS-CoV-2 target nucleic acids are NOT DETECTED. The SARS-CoV-2 RNA is generally detectable in upper and lower  respiratory specimens during the acute phase of infection. The lowest  concentration of SARS-CoV-2 viral copies this assay  can detect is 250  copies / mL. A negative result does not preclude SARS-CoV-2 infection  and should not be used as the sole basis for treatment or other  patient management decisions.  A negative result may occur with  improper specimen collection / handling, submission of specimen other  than nasopharyngeal swab, presence of viral mutation(s) within the  areas targeted by this assay, and inadequate number of viral copies  (<250 copies / mL). A negative result must be combined with clinical  observations, patient history, and epidemiological information. If result is POSITIVE SARS-CoV-2 target nucleic acids are DETECTED. The SARS-CoV-2 RNA is generally detectable in upper and lower  respiratory specimens dur ing the acute phase of infection.  Positive  results are indicative of active infection with SARS-CoV-2.  Clinical  correlation with patient history and other diagnostic information is  necessary to determine patient infection status.  Positive results do  not rule out bacterial infection or co-infection with other viruses. If result is PRESUMPTIVE POSTIVE SARS-CoV-2 nucleic acids MAY BE PRESENT.   A presumptive positive result was obtained on the submitted specimen  and confirmed on repeat testing.  While 2019 novel coronavirus  (SARS-CoV-2) nucleic acids may be present  in the submitted sample  additional confirmatory testing may be necessary for epidemiological  and / or clinical management purposes  to differentiate between  SARS-CoV-2 and other Sarbecovirus currently known to infect humans.  If clinically indicated additional testing with an alternate test  methodology [phone]) is advised. The SARS-CoV-2 RNA is generally  detectable in upper and lower respiratory sp ecimens during the acute  phase of infection. The expected result is Negative. Fact Sheet for Patients:  StrictlyIdeas.no Fact Sheet for Healthcare  Providers: BankingDealers.co.za This test is not yet approved or cleared by the [place] FDA and has been authorized for detection and/or diagnosis of SARS-CoV-2 by FDA under an Emergency Use Authorization (EUA).  This EUA will remain in effect (meaning this test can be used) for the duration of the COVID-19 declaration under Section 564(b)(1) of the Act, 21 U.S.C. section 360bbb-3(b)(1), unless the authorization is terminated or revoked sooner. Performed at [facility], [place] [address] [place], [place] [address]          Radiology Studies: Ct Abdomen Pelvis W Contrast  Result Date: 01/20/2019 CLINICAL DATA:  Acute generalized abdominal pain EXAM: CT ABDOMEN AND PELVIS WITH CONTRAST TECHNIQUE: Multidetector CT imaging of the abdomen and pelvis was performed using the standard protocol following bolus administration of intravenous contrast. CONTRAST:  33mL OMNIPAQUE IOHEXOL 300 MG/ML  SOLN COMPARISON:  08/07/2018 FINDINGS: Lower chest:  No contributory findings. Hepatobiliary: Fat deposition about the falciform ligament. Small subcapsular right hepatic cyst.Pneumobilia after Whipple procedure. Pancreas: Whipple procedure with chronic pancreatitis showing atrophy and coarse calcifications. The main duct is chronically dilated and there is ductal calculi with more proximal parenchymal edema and peripancreatic fat stranding. No abscess or collection. Spleen: Unremarkable. Adrenals/Urinary Tract: Negative adrenals. Renal cysts and scarring. Unremarkable bladder. Stomach/Bowel:  No obstruction. No appendicitis. Vascular/Lymphatic: No acute vascular abnormality. Atherosclerosis. No mass or adenopathy. Reproductive:No pathologic findings. Other: No ascites or pneumoperitoneum. Musculoskeletal: No acute abnormalities. L5-S1 advanced disc degeneration. IMPRESSION: Acute pancreatitis proximal to pancreatic duct calculi. There is a background of chronic  pancreatitis after Whipple procedure. Electronically Signed   By: Monte Fantasia M.D.   On: 01/20/2019 05:42        Scheduled Meds:  famotidine  10 mg Oral TID   heparin  5,000 Units Subcutaneous Q8H   levothyroxine  100 mcg Oral Daily   lipase/protease/amylase  36,000 Units Oral TID AC   lisinopril  10 mg Oral Daily   pravastatin  20 mg Oral Daily   sodium chloride flush  10-40 mL Intracatheter Q12H   Continuous Infusions:  sodium chloride 125 mL/hr at 01/21/19 1032     LOS: 1 day    Time spent: 25 minutes    Barb Merino, MD [facility] Pager [phone]  If 7PM-7AM, please contact night-coverage [email or web address] Password [email or web address] 01/21/2019, 1:37 PM

## 2019-01-22 LAB — CBC
HCT: 32.9 % — ABNORMAL LOW (ref 36.0–46.0)
Hemoglobin: 10.6 g/dL — ABNORMAL LOW (ref 12.0–15.0)
MCH: 31.4 pg (ref 26.0–34.0)
MCHC: 32.2 g/dL (ref 30.0–36.0)
MCV: 97.3 fL (ref 80.0–100.0)
Platelets: 191 10*3/uL (ref 150–400)
RBC: 3.38 MIL/uL — ABNORMAL LOW (ref 3.87–5.11)
RDW: 14.3 % (ref 11.5–15.5)
WBC: 9.1 10*3/uL (ref 4.0–10.5)
nRBC: 0 % (ref 0.0–0.2)

## 2019-01-22 LAB — COMPREHENSIVE METABOLIC PANEL
ALT: 26 U/L (ref 0–44)
AST: 23 U/L (ref 15–41)
Albumin: 2.9 g/dL — ABNORMAL LOW (ref 3.5–5.0)
Alkaline Phosphatase: 83 U/L (ref 38–126)
Anion gap: 6 (ref 5–15)
BUN: 9 mg/dL (ref 8–23)
CO2: 19 mmol/L — ABNORMAL LOW (ref 22–32)
Calcium: 7.9 mg/dL — ABNORMAL LOW (ref 8.9–10.3)
Chloride: 110 mmol/L (ref 98–111)
Creatinine, Ser: 0.82 mg/dL (ref 0.44–1.00)
GFR calc Af Amer: >60 mL/min (ref >60)
GFR calc non Af Amer: >60 mL/min (ref >60)
Glucose, Bld: 113 mg/dL — ABNORMAL HIGH (ref 70–99)
Potassium: 4 mmol/L (ref 3.5–5.1)
Sodium: 135 mmol/L (ref 135–145)
Total Bilirubin: 0.5 mg/dL (ref 0.3–1.2)
Total Protein: 5.6 g/dL — ABNORMAL LOW (ref 6.5–8.1)

## 2019-01-22 LAB — GLUCOSE, CAPILLARY
Glucose-Capillary: 107 mg/dL — ABNORMAL HIGH (ref 70–99)
Glucose-Capillary: 139 mg/dL — ABNORMAL HIGH (ref 70–99)
Glucose-Capillary: 145 mg/dL — ABNORMAL HIGH (ref 70–99)
Glucose-Capillary: 96 mg/dL (ref 70–99)
Glucose-Capillary: 96 mg/dL (ref 70–99)

## 2019-01-22 LAB — LIPASE, BLOOD: Lipase: 16 U/L (ref 11–51)

## 2019-01-22 MED ORDER — MORPHINE SULFATE (PF) 4 MG/ML IV SOLN
4.0000 mg | INTRAVENOUS | Status: DC | PRN
  Administered 2019-01-22 (×3): 4 mg via INTRAVENOUS
  Filled 2019-01-22 (×3): qty 1

## 2019-01-22 MED ORDER — OXYCODONE HCL 5 MG PO TABS
5.0000 mg | ORAL_TABLET | Freq: Four times a day (QID) | ORAL | Status: DC | PRN
  Administered 2019-01-22 – 2019-01-23 (×3): 5 mg via ORAL
  Filled 2019-01-22 (×4): qty 1

## 2019-01-22 NOTE — Progress Notes (Signed)
PROGRESS NOTE    Deborah Travis  IHK:[id number] DOB: 05/29/1941 DOA: 01/20/2019 PCP: [name], MD    Brief Narrative:  78 year old female with history of rheumatoid arthritis, diabetes mellitus, hypertension, hyperlipidemia and hypothyroidism, stage II chronic kidney disease, chronic pancreatitis status post Whipple's procedure who presented to the emergency department from home with complaints of abdominal pain nausea vomiting of 1 day. Patient presented to the emergency room with severe abdominal pain after having a cookout with family to celebrate [employer] Day. Patient was found to have pancreatic duct dilatation with suspected pancreatic duct stone. CT scan evidence of some acute pancreatitis and pancreatic duct calculi.   Assessment & Plan:   Principal Problem:   Acute pancreatitis Active Problems:   Type 2 diabetes with complication (HCC)   Hypertension associated with diabetes ([place])   Hyperlipidemia associated with type 2 diabetes mellitus (HCC)   Hypothyroidism   Seronegative rheumatoid arthritis (HCC)   Abdominal pain  Epigastric and right upper quadrant abdominal pain, suspected acute on chronic recurrent pancreatitis: Lipase normal.  Persistent abdomen pain.  Hemodynamically stable. -Continue IV fluids.  Continue adequate pain medications nausea medication.  Advancing to low-fat diet. -Seen by gastroenterology, expecting conservative management. -We will continue to monitor liver functions and renal functions.  Normal so far.  Hypertension: Currently stable on home medications.  Hyperlipidemia: On a statin at home.  She will continue.  Type 2 diabetes: Patient is on metformin at home.  Last A1c was 7.8.  Will keep on sliding scale insulin.  Hypothyroidism: On Synthroid.  Seronegative rheumatoid arthritis: Followed by rheumatology.  On Arava at home.  Acute kidney injury on CKD stage II: Kidney functions normal at her usual levels.  Anemia: Baseline hemoglobin  about to 12.6-13.  Dropped to 10 with dilution.  We will continue to monitor.  Denies any melena.  DVT prophylaxis: Heparin subcu Code Status: Full code Family Communication: Discussed with patient's daughter over the phone. Disposition Plan: Inpatient   Consultants:   Gastroenterology  Procedures:   None  Antimicrobials:   None   Subjective:  Patient was seen and examined.  Continues to have left upper quadrant abdominal pain.  Denies any nausea or vomiting.  No bowel movement.  She is passing flatus.  Injectable morphine was not enough to control her pain.  Objective: Vitals:   01/21/19 0945 01/21/19 1458 01/21/19 2119 01/22/19 0617  BP: (!) 141/83 (!) 145/85 138/87 (!) 159/93  Pulse: 77 72 75 83  Resp: 16 18 16 16   Temp: 98.3 F (36.8 C) 97.6 F (36.4 C) 98.1 F (36.7 C) 98.1 F (36.7 C)  TempSrc: Oral Oral Oral Oral  SpO2: 96% 97% 100% 98%  Weight:      Height:        Intake/Output Summary (Last 24 hours) at 01/22/2019 1346 Last data filed at 01/21/2019 1700 Gross per 24 hour  Intake 360 ml  Output --  Net 360 ml   Filed Weights   01/20/19 0250  Weight: 47.6 kg    Examination:  Physical Exam  Constitutional: She is oriented to person, place, and time. She appears well-developed and well-nourished.  Mildly distressed with pain.  HENT:  Head: Normocephalic and atraumatic.  Mouth/Throat: Oropharynx is clear and moist.  Eyes: Pupils are equal, round, and reactive to light. Conjunctivae are normal.  Neck: Normal range of motion. Neck supple. No tracheal deviation present. No thyromegaly present.  Cardiovascular: Normal rate, regular rhythm and normal heart sounds.  Pulmonary/Chest: Effort normal  and breath sounds normal. No respiratory distress.  Abdominal: Soft. Bowel sounds are normal. There is abdominal tenderness.  Patient has moderate tenderness on the left upper quadrant with no residual guarding.  Musculoskeletal: Normal range of motion.         General: No edema.  Neurological: She is alert and oriented to person, place, and time.  Skin: Skin is warm and dry.  Psychiatric: She has a normal mood and affect. Judgment normal.       Data Reviewed: I have personally reviewed following labs and imaging studies  CBC: Recent Labs  Lab 01/20/19 0256 01/21/19 0353 01/22/19 0402  WBC 11.5* 11.8* 9.1  NEUTROABS 9.5*  --   --   HGB 13.3 11.9* 10.6*  HCT 39.1 36.3 32.9*  MCV 93.5 96.5 97.3  PLT 254 231 616   Basic Metabolic Panel: Recent Labs  Lab 01/20/19 0256 01/21/19 0353 01/22/19 0402  NA 136 134* 135  K 4.1 4.1 4.0  CL 105 104 110  CO2 20* 20* 19*  GLUCOSE 223* 143* 113*  BUN 22 11 9   CREATININE 1.09* 0.79 0.82  CALCIUM 8.9 7.9* 7.9*   GFR: Estimated Creatinine Clearance: 44.5 mL/min (by C-G formula based on SCr of 0.82 mg/dL). Liver Function Tests: Recent Labs  Lab 01/20/19 0256 01/21/19 0353 01/22/19 0402  AST 41 23 23  ALT 48* 32 26  ALKPHOS 113 94 83  BILITOT 0.6 0.5 0.5  PROT 7.7 6.2* 5.6*  ALBUMIN 4.5 3.3* 2.9*   Recent Labs  Lab 01/20/19 0256 01/20/19 1136 01/22/19 0402  LIPASE 16 19 16    No results for input(s): AMMONIA in the last 168 hours. Coagulation Profile: No results for input(s): INR, PROTIME in the last 168 hours. Cardiac Enzymes: No results for input(s): CKTOTAL, CKMB, CKMBINDEX, TROPONINI in the last 168 hours. BNP (last 3 results) No results for input(s): PROBNP in the last 8760 hours. HbA1C: No results for input(s): HGBA1C in the last 72 hours. CBG: Recent Labs  Lab 01/21/19 1724 01/22/19 0013 01/22/19 0620 01/22/19 1243  GLUCAP 117* 107* 96 139*   Lipid Profile: No results for input(s): CHOL, HDL, LDLCALC, TRIG, CHOLHDL, LDLDIRECT in the last 72 hours. Thyroid Function Tests: No results for input(s): TSH, T4TOTAL, FREET4, T3FREE, THYROIDAB in the last 72 hours. Anemia Panel: No results for input(s): VITAMINB12, FOLATE, FERRITIN, TIBC, IRON, RETICCTPCT in the  last 72 hours. Sepsis Labs: No results for input(s): PROCALCITON, LATICACIDVEN in the last 168 hours.  Recent Results (from the past 240 hour(s))  SARS Coronavirus 2 (CEPHEID - Performed in [place] hospital lab), Hosp Order     Status: None   Collection Time: 01/20/19  8:12 AM  Result Value Ref Range Status   SARS Coronavirus 2 NEGATIVE NEGATIVE Final    Comment: (NOTE) If result is NEGATIVE SARS-CoV-2 target nucleic acids are NOT DETECTED. The SARS-CoV-2 RNA is generally detectable in upper and lower  respiratory specimens during the acute phase of infection. The lowest  concentration of SARS-CoV-2 viral copies this assay can detect is 250  copies / mL. A negative result does not preclude SARS-CoV-2 infection  and should not be used as the sole basis for treatment or other  patient management decisions.  A negative result may occur with  improper specimen collection / handling, submission of specimen other  than nasopharyngeal swab, presence of viral mutation(s) within the  areas targeted by this assay, and inadequate number of viral copies  (<250 copies / mL). A negative  result must be combined with clinical  observations, patient history, and epidemiological information. If result is POSITIVE SARS-CoV-2 target nucleic acids are DETECTED. The SARS-CoV-2 RNA is generally detectable in upper and lower  respiratory specimens dur ing the acute phase of infection.  Positive  results are indicative of active infection with SARS-CoV-2.  Clinical  correlation with patient history and other diagnostic information is  necessary to determine patient infection status.  Positive results do  not rule out bacterial infection or co-infection with other viruses. If result is PRESUMPTIVE POSTIVE SARS-CoV-2 nucleic acids MAY BE PRESENT.   A presumptive positive result was obtained on the submitted specimen  and confirmed on repeat testing.  While 2019 novel coronavirus  (SARS-CoV-2) nucleic  acids may be present in the submitted sample  additional confirmatory testing may be necessary for epidemiological  and / or clinical management purposes  to differentiate between  SARS-CoV-2 and other Sarbecovirus currently known to infect humans.  If clinically indicated additional testing with an alternate test  methodology [phone]) is advised. The SARS-CoV-2 RNA is generally  detectable in upper and lower respiratory sp ecimens during the acute  phase of infection. The expected result is Negative. Fact Sheet for Patients:  StrictlyIdeas.no Fact Sheet for Healthcare Providers: BankingDealers.co.za This test is not yet approved or cleared by the [place] FDA and has been authorized for detection and/or diagnosis of SARS-CoV-2 by FDA under an Emergency Use Authorization (EUA).  This EUA will remain in effect (meaning this test can be used) for the duration of the COVID-19 declaration under Section 564(b)(1) of the Act, 21 U.S.C. section 360bbb-3(b)(1), unless the authorization is terminated or revoked sooner. Performed at [facility], [place] [address] [place], [place] [address]          Radiology Studies: No results found.      Scheduled Meds:  famotidine  10 mg Oral TID   heparin  5,000 Units Subcutaneous Q8H   insulin aspart  0-9 Units Subcutaneous Q6H   levothyroxine  100 mcg Oral Daily   lipase/protease/amylase  36,000 Units Oral TID AC   lisinopril  10 mg Oral Daily   pravastatin  20 mg Oral Daily   sodium chloride flush  10-40 mL Intracatheter Q12H   Continuous Infusions:  sodium chloride 125 mL/hr at 01/22/19 1116     LOS: 2 days    Time spent: 25 minutes    Barb Merino, MD [facility] Pager [phone]  If 7PM-7AM, please contact night-coverage [email or web address] Password [email or web address] 01/22/2019, 1:46 PM

## 2019-01-22 NOTE — Progress Notes (Signed)
The patient is doing about the same.  She continues to have pain in the left upper quadrant, underneath the lateral portion of the left sided lower costal margin.  She does not have pain in the ribs.  Vitals remain unremarkable.  On exam, when I came in, she was standing up beside her bed in the room, in no distress whatsoever, although she did indicate that she is awaiting more pain medication.  Palpation of the rib cage discloses no tenderness, whereas palpation of the infracostal area is mildly tender, without guarding, exquisite tenderness, or peritoneal findings.  Labs show improvement in the mild leukocytosis that she had on admission; her white count is now normal.  Her hemoglobin has continued to drift down about 1 g/day with hydration.  Her lipase and liver chemistries remain normal.  Impression: Persistent pain presumably related to chronic pancreatitis but without any biochemical or radiographic evidence of acute pancreatitis at present.  Recommendations:  1.  Okay to advance diet.  She remains hungry.  I have ordered a low-fat diet for her. 2.  We will have to watch carefully for pain medication seeking behavior, given the disconnect between subjective symptoms and objective findings, although that does not appear to be part of this patient's past legacy.  I would try to control pain with physical measures such as a K pad, to the degree possible. 3.  If there is a significant increase in pain following reinstitution of food intake, we would need to recheck labs and consider pancreatic enzyme supplementation, although my understanding is that apparently these were not helpful in the past (we may need to check into that in more detail).  Cleotis Nipper, M.D. Pager [phone] If no answer or after 5 PM call [phone]

## 2019-01-23 DIAGNOSIS — K8500 Idiopathic acute pancreatitis without necrosis or infection: Secondary | ICD-10-CM

## 2019-01-23 LAB — GLUCOSE, CAPILLARY
Glucose-Capillary: 92 mg/dL (ref 70–99)
Glucose-Capillary: 144 mg/dL — ABNORMAL HIGH (ref 70–99)

## 2019-01-23 MED ORDER — KETOROLAC TROMETHAMINE 15 MG/ML IJ SOLN
15.0000 mg | Freq: Four times a day (QID) | INTRAMUSCULAR | Status: DC | PRN
  Administered 2019-01-23: 15 mg via INTRAVENOUS
  Filled 2019-01-23: qty 1

## 2019-01-23 MED ORDER — PANCRELIPASE (LIP-PROT-AMYL) 36000-114000 UNITS PO CPEP: 36000.0000 [IU] | ORAL_CAPSULE | Freq: Three times a day (TID) | ORAL | 0 refills | Status: AC

## 2019-01-23 MED ORDER — TRAMADOL HCL 50 MG PO TABS: 50.0000 mg | ORAL_TABLET | Freq: Four times a day (QID) | ORAL | 0 refills | Status: AC | PRN

## 2019-01-23 MED ORDER — POLYETHYLENE GLYCOL 3350 17 G PO PACK
17.0000 g | PACK | Freq: Every day | ORAL | Status: DC | PRN
  Administered 2019-01-23: 17 g via ORAL
  Filled 2019-01-23: qty 1

## 2019-01-23 NOTE — Progress Notes (Signed)
Deborah Travis 12:57 PM  Subjective: Patient eating some and pain is some better and she wants to go home and heating pad helps her case discussed with my partner Dr. B and she cannot tell the difference from the pancreatic enzymes here or the one she uses at home  Objective: Vital signs stable afebrile no acute distress abdomen is soft nontender she may even be a little more tender on the ribs no new labs  Assessment: Pancreatitis stable  Plan: Patient can probably go home and follow-up with my partner Dr. Penelope Coop her primary gastroenterologist in a week or 2 and if she continues to have problems or has multiple attacks probably will need referral to a [facility] to decide if pancreatic stone removal is warranted but Dr. Cristina Gong and I both agree with this seemingly mild attack a wait-and-see approach is indicated  [facility]  office [phone] After 5PM or if no answer call [phone]

## 2019-01-23 NOTE — Progress Notes (Signed)
Patient is stable for discharge. Discharge instructions and medications have been reviewed with the patient and all questions answered. AVS and prescriptions given to patient.  

## 2019-01-23 NOTE — Discharge Summary (Signed)
Physician Discharge Summary  Deborah Travis ZOX:[id number] DOB: August 01, 1941 DOA: 01/20/2019  PCP: [name], MD  Admit date: 01/20/2019 Discharge date: 01/23/2019  Admitted From: Home. Disposition: Home.  Recommendations for Outpatient Follow-up:  1. Follow up with PCP in 1-2 weeks 2. Follow-up with gastroenterology.  Home Health: Not applicable Equipment/Devices: Not applicable  Discharge Condition: Stable CODE STATUS: Full code Diet recommendation: Soft diet to advance to regular diet.  Brief/Interim Summary: 78 year old female with history of rheumatoid arthritis, diabetes mellitus, hypertension, hyperlipidemia and hypothyroidism, stage II chronic kidney disease, chronic pancreatitis status post Whipple's procedure who presented to the emergency department from home with complaints of abdominal pain nausea vomiting of 1 day. Patient presented to the emergency room with severe abdominal pain after having a cookout with family to celebrate Memorial Day. Patient was found to have pancreatic duct dilatation with suspected pancreatic duct stone. CT scan evidence of some acute pancreatitis and pancreatic duct calculi.   Discharge Diagnoses:  Principal Problem:   Acute pancreatitis Active Problems:   Type 2 diabetes with complication (HCC)   Hypertension associated with diabetes ([place])   Hyperlipidemia associated with type 2 diabetes mellitus (HCC)   Hypothyroidism   Seronegative rheumatoid arthritis (HCC)   Abdominal pain  Epigastric and right upper quadrant abdominal pain/suspected acute on chronic recurrent pancreatitis: Patient had normal lipase.  She had persistent abdominal pain but she was hemodynamically stable.  She was treated with IV fluids and pain medications.  She was seen and followed by gastroenterology.  Patient has Whipple's procedure and remained without complications.  Today patient is tolerating soft diet, her pain is controlled with oral pain medications,  electrolytes are normal.  Lipase is normal.  As per GI recommendation, patient is going home on soft diet, she will advance when pain subsides. She will follow-up with gastroenterology as outpatient to discuss if she has more recurrent pain. Patient resume all her home medications. A short course of tramadol was prescribed.  She had some brief period of confusion in the hospital after morphine.  Discharge Instructions  Discharge Instructions    Call MD for:  persistant nausea and vomiting   Complete by:  As directed    Call MD for:  severe uncontrolled pain   Complete by:  As directed    Diet - low sodium heart healthy   Complete by:  As directed    Discharge instructions   Complete by:  As directed    Soft diet  Take tylenol for mild pain, tramadol for more pain Take over the counter laxatives as needed   Increase activity slowly   Complete by:  As directed      Allergies as of 01/23/2019      Reactions   Levofloxacin    Muscle spasms and tightness    Codeine Itching   Morphine And Related Itching      Medication List    TAKE these medications   Accu-Chek Aviva Plus w/Device Kit Use as directed Patient is to test BID DX E11.9   Accu-Chek Aviva Soln Use as directed   Accu-Chek FastClix Lancets Misc 1 Device by Does not apply route 2 (two) times daily.   b complex vitamins capsule Take 1 capsule by mouth daily.   B-D SINGLE USE SWABS BUTTERFLY Pads 1 each by Does not apply route 2 (two) times daily.   cholecalciferol 25 MCG (1000 UT) tablet Commonly known as:  VITAMIN D3 Take 1,000 Units by mouth 2 (two) times a day.  CO Q 10 PO Take 1 tablet by mouth daily.   famotidine 10 MG tablet Commonly known as:  PEPCID Take 10 mg by mouth 3 (three) times daily.   glucose blood test strip THIS IS FOR Accucheck Aviva Plus Use as instructed PATIENT IS TO TEST ONE TIME A DAY DX: E11.9   leflunomide 20 MG tablet Commonly known as:  ARAVA Take 1 tablet (20 mg total)  by mouth daily.   levothyroxine 100 MCG tablet Commonly known as:  SYNTHROID Take 1 tablet (100 mcg total) by mouth daily.   lipase/protease/amylase 36000 UNITS Cpep capsule Commonly known as:  CREON Take 1 capsule (36,000 Units total) by mouth 3 (three) times daily before meals for 30 days.   lisinopril 10 MG tablet Commonly known as:  ZESTRIL Take 1 tablet (10 mg total) by mouth daily.   metFORMIN 1000 MG tablet Commonly known as:  GLUCOPHAGE One twice a day What changed:    how much to take  how to take this  when to take this  additional instructions   multivitamin with minerals Tabs tablet Take 1 tablet by mouth daily.   pravastatin 20 MG tablet Commonly known as:  PRAVACHOL Take 1 tablet (20 mg total) by mouth daily.   traMADol 50 MG tablet Commonly known as:  Ultram Take 1 tablet (50 mg total) by mouth every 6 (six) hours as needed for up to 5 days.   VITAMIN A PO Take 1 tablet by mouth daily.   VITAMIN E COMPLEX PO Take 1 capsule by mouth daily.   ZINC 15 PO Take 15 mg by mouth daily.       Allergies  Allergen Reactions  . Levofloxacin     Muscle spasms and tightness   . Codeine Itching  . Morphine And Related Itching    Consultations:  Gastroenterology   Procedures/Studies: Ct Abdomen Pelvis W Contrast  Result Date: 01/20/2019 CLINICAL DATA:  Acute generalized abdominal pain EXAM: CT ABDOMEN AND PELVIS WITH CONTRAST TECHNIQUE: Multidetector CT imaging of the abdomen and pelvis was performed using the standard protocol following bolus administration of intravenous contrast. CONTRAST:  80mL OMNIPAQUE IOHEXOL 300 MG/ML  SOLN COMPARISON:  08/07/2018 FINDINGS: Lower chest:  No contributory findings. Hepatobiliary: Fat deposition about the falciform ligament. Small subcapsular right hepatic cyst.Pneumobilia after Whipple procedure. Pancreas: Whipple procedure with chronic pancreatitis showing atrophy and coarse calcifications. The main duct is  chronically dilated and there is ductal calculi with more proximal parenchymal edema and peripancreatic fat stranding. No abscess or collection. Spleen: Unremarkable. Adrenals/Urinary Tract: Negative adrenals. Renal cysts and scarring. Unremarkable bladder. Stomach/Bowel:  No obstruction. No appendicitis. Vascular/Lymphatic: No acute vascular abnormality. Atherosclerosis. No mass or adenopathy. Reproductive:No pathologic findings. Other: No ascites or pneumoperitoneum. Musculoskeletal: No acute abnormalities. L5-S1 advanced disc degeneration. IMPRESSION: Acute pancreatitis proximal to pancreatic duct calculi. There is a background of chronic pancreatitis after Whipple procedure. Electronically Signed   By: Jonathon  Watts M.D.   On: 01/20/2019 05:42   Subjective: Patient seen and examined on the day of discharge.  She had mild left upper quadrant abdominal pain.  She was able to eat regular diet since last night with no nausea or vomiting and no change in her pain. No bowel movement, however passing flatus. She was eager to go home.   Discharge Exam: Vitals:   01/23/19 0543 01/23/19 1339  BP: 121/75 (!) 141/79  Pulse: 72 (!) 59  Resp: 16 16  Temp: 98.3 F (36.8 C) 98 F (36.7 C)    SpO2: 97% 97%   Vitals:   01/22/19 1408 01/22/19 2134 01/23/19 0543 01/23/19 1339  BP: 110/69 111/65 121/75 (!) 141/79  Pulse: 70 70 72 (!) 59  Resp: _0 Temp: 98.6 F (37 C) 97.7 F (36.5 C) 98.3 F (36.8 C) 98 F (36.7 C)  TempSrc: Oral Oral Oral Oral  SpO2: 94% 97% 97% 97%  Weight:      Height:        General: Pt is alert, awake, not in acute distress Cardiovascular: RRR, S1/S2 +, no rubs, no gallops Respiratory: CTA bilaterally, no wheezing, no rhonchi Abdominal: Soft, NT, ND, bowel sounds +, mild abdominal pain left lateral quadrant and upper quadrant with no rigidity or guarding. Extremities: no edema, no cyanosis    The results of significant diagnostics from this hospitalization  (including imaging, microbiology, ancillary and laboratory) are listed below for reference.     Microbiology: Recent Results (from the past 240 hour(s))  SARS Coronavirus 2 (CEPHEID - Performed in [place] hospital lab), Hosp Order     Status: None   Collection Time: 01/20/19  8:12 AM  Result Value Ref Range Status   SARS Coronavirus 2 NEGATIVE NEGATIVE Final    Comment: (NOTE) If result is NEGATIVE SARS-CoV-2 target nucleic acids are NOT DETECTED. The SARS-CoV-2 RNA is generally detectable in upper and lower  respiratory specimens during the acute phase of infection. The lowest  concentration of SARS-CoV-2 viral copies this assay can detect is 250  copies / mL. A negative result does not preclude SARS-CoV-2 infection  and should not be used as the sole basis for treatment or other  patient management decisions.  A negative result may occur with  improper specimen collection / handling, submission of specimen other  than nasopharyngeal swab, presence of viral mutation(s) within the  areas targeted by this assay, and inadequate number of viral copies  (<250 copies / mL). A negative result must be combined with clinical  observations, patient history, and epidemiological information. If result is POSITIVE SARS-CoV-2 target nucleic acids are DETECTED. The SARS-CoV-2 RNA is generally detectable in upper and lower  respiratory specimens dur ing the acute phase of infection.  Positive  results are indicative of active infection with SARS-CoV-2.  Clinical  correlation with patient history and other diagnostic information is  necessary to determine patient infection status.  Positive results do  not rule out bacterial infection or co-infection with other viruses. If result is PRESUMPTIVE POSTIVE SARS-CoV-2 nucleic acids MAY BE PRESENT.   A presumptive positive result was obtained on the submitted specimen  and confirmed on repeat testing.  While 2019 novel coronavirus  (SARS-CoV-2)  nucleic acids may be present in the submitted sample  additional confirmatory testing may be necessary for epidemiological  and / or clinical management purposes  to differentiate between  SARS-CoV-2 and other Sarbecovirus currently known to infect humans.  If clinically indicated additional testing with an alternate test  methodology [phone]) is advised. The SARS-CoV-2 RNA is generally  detectable in upper and lower respiratory sp ecimens during the acute  phase of infection. The expected result is Negative. Fact Sheet for Patients:  StrictlyIdeas.no Fact Sheet for Healthcare Providers: BankingDealers.co.za This test is not yet approved or cleared by the [place] FDA and has been authorized for detection and/or diagnosis of SARS-CoV-2 by FDA under an Emergency Use Authorization (EUA).  This EUA will remain in effect (meaning this test can be used) for the duration of the COVID-19  declaration under Section 564(b)(1) of the Act, 21 U.S.C. section 360bbb-3(b)(1), unless the authorization is terminated or revoked sooner. Performed at [facility], [address] [place], [place] [address]      Labs: BNP (last 3 results) No results for input(s): BNP in the last 8760 hours. Basic Metabolic Panel: Recent Labs  Lab 01/20/19 0256 01/21/19 0353 01/22/19 0402  NA 136 134* 135  K 4.1 4.1 4.0  CL 105 104 110  CO2 20* 20* 19*  GLUCOSE 223* 143* 113*  BUN 22 11 9  CREATININE 1.09* 0.79 0.82  CALCIUM 8.9 7.9* 7.9*   Liver Function Tests: Recent Labs  Lab 01/20/19 0256 01/21/19 0353 01/22/19 0402  AST 41 23 23  ALT 48* 32 26  ALKPHOS 113 94 83  BILITOT 0.6 0.5 0.5  PROT 7.7 6.2* 5.6*  ALBUMIN 4.5 3.3* 2.9*   Recent Labs  Lab 01/20/19 0256 01/20/19 1136 01/22/19 0402  LIPASE 16 19 16   No results for input(s): AMMONIA in the last 168 hours. CBC: Recent Labs  Lab 01/20/19 0256 01/21/19 0353  01/22/19 0402  WBC 11.5* 11.8* 9.1  NEUTROABS 9.5*  --   --   HGB 13.3 11.9* 10.6*  HCT 39.1 36.3 32.9*  MCV 93.5 96.5 97.3  PLT 254 231 191   Cardiac Enzymes: No results for input(s): CKTOTAL, CKMB, CKMBINDEX, TROPONINI in the last 168 hours. BNP: Invalid input(s): POCBNP CBG: Recent Labs  Lab 01/22/19 1243 01/22/19 1811 01/22/19 2341 01/23/19 0540 01/23/19 1139  GLUCAP 139* 145* 96 92 144*   D-Dimer No results for input(s): DDIMER in the last 72 hours. Hgb A1c No results for input(s): HGBA1C in the last 72 hours. Lipid Profile No results for input(s): CHOL, HDL, LDLCALC, TRIG, CHOLHDL, LDLDIRECT in the last 72 hours. Thyroid function studies No results for input(s): TSH, T4TOTAL, T3FREE, THYROIDAB in the last 72 hours.  Invalid input(s): FREET3 Anemia work up No results for input(s): VITAMINB12, FOLATE, FERRITIN, TIBC, IRON, RETICCTPCT in the last 72 hours. Urinalysis    Component Value Date/Time   COLORURINE YELLOW 01/20/2019 0256   APPEARANCEUR CLEAR 01/20/2019 0256   LABSPEC 1.016 01/20/2019 0256   PHURINE 5.0 01/20/2019 0256   GLUCOSEU 150 (A) 01/20/2019 0256   HGBUR NEGATIVE 01/20/2019 0256   BILIRUBINUR NEGATIVE 01/20/2019 0256   KETONESUR 20 (A) 01/20/2019 0256   PROTEINUR NEGATIVE 01/20/2019 0256   UROBILINOGEN 0.2 12/20/2010 0957   NITRITE NEGATIVE 01/20/2019 0256   LEUKOCYTESUR NEGATIVE 01/20/2019 0256   Sepsis Labs Invalid input(s): PROCALCITONIN,  WBC,  LACTICIDVEN Microbiology Recent Results (from the past 240 hour(s))  SARS Coronavirus 2 (CEPHEID - Performed in [place] hospital lab), Hosp Order     Status: None   Collection Time: 01/20/19  8:12 AM  Result Value Ref Range Status   SARS Coronavirus 2 NEGATIVE NEGATIVE Final    Comment: (NOTE) If result is NEGATIVE SARS-CoV-2 target nucleic acids are NOT DETECTED. The SARS-CoV-2 RNA is generally detectable in upper and lower  respiratory specimens during the acute phase of infection. The  lowest  concentration of SARS-CoV-2 viral copies this assay can detect is 250  copies / mL. A negative result does not preclude SARS-CoV-2 infection  and should not be used as the sole basis for treatment or other  patient management decisions.  A negative result may occur with  improper specimen collection / handling, submission of specimen other  than nasopharyngeal swab, presence of viral mutation(s) within the  areas targeted   by this assay, and inadequate number of viral copies  (<250 copies / mL). A negative result must be combined with clinical  observations, patient history, and epidemiological information. If result is POSITIVE SARS-CoV-2 target nucleic acids are DETECTED. The SARS-CoV-2 RNA is generally detectable in upper and lower  respiratory specimens dur ing the acute phase of infection.  Positive  results are indicative of active infection with SARS-CoV-2.  Clinical  correlation with patient history and other diagnostic information is  necessary to determine patient infection status.  Positive results do  not rule out bacterial infection or co-infection with other viruses. If result is PRESUMPTIVE POSTIVE SARS-CoV-2 nucleic acids MAY BE PRESENT.   A presumptive positive result was obtained on the submitted specimen  and confirmed on repeat testing.  While 2019 novel coronavirus  (SARS-CoV-2) nucleic acids may be present in the submitted sample  additional confirmatory testing may be necessary for epidemiological  and / or clinical management purposes  to differentiate between  SARS-CoV-2 and other Sarbecovirus currently known to infect humans.  If clinically indicated additional testing with an alternate test  methodology [phone]) is advised. The SARS-CoV-2 RNA is generally  detectable in upper and lower respiratory sp ecimens during the acute  phase of infection. The expected result is Negative. Fact Sheet for Patients:   StrictlyIdeas.no Fact Sheet for Healthcare Providers: BankingDealers.co.za This test is not yet approved or cleared by the [place] FDA and has been authorized for detection and/or diagnosis of SARS-CoV-2 by FDA under an Emergency Use Authorization (EUA).  This EUA will remain in effect (meaning this test can be used) for the duration of the COVID-19 declaration under Section 564(b)(1) of the Act, 21 U.S.C. section 360bbb-3(b)(1), unless the authorization is terminated or revoked sooner. Performed at [facility], [place] [address] [place], [place] [address]      Time coordinating discharge: 25 minutes  SIGNED:   Barb Merino, MD  [facility] 01/23/2019, 3:35 PM Pager [phone]  If 7PM-7AM, please contact night-coverage [email or web address] Password [email or web address]

## 2019-01-23 NOTE — Care Management Important Message (Signed)
Important Message  Patient Details IM Letter given to Rhea Pink SW to present to the Patient Name: Deborah Travis MRN: [id number] Date of Birth: [birth year]   Medicare Important Message Given:  Yes    Kerin Salen 01/23/2019, 11:39 AM

## 2019-01-26 ENCOUNTER — Telehealth: Payer: Self-pay | Admitting: Internal Medicine

## 2019-01-26 NOTE — Telephone Encounter (Signed)
Hospital follow-up. All meds have been gone over with and no new meds. Pt is scheduled for follow-up on 02/02/2019

## 2019-01-28 ENCOUNTER — Telehealth: Payer: Self-pay

## 2019-01-28 NOTE — Telephone Encounter (Signed)
Schedule her to come in so we can take a look at this

## 2019-01-28 NOTE — Telephone Encounter (Signed)
Pt says her ankles are swelling and without reason. Pt has Virtual appt Monday. Please advise [facility]

## 2019-02-02 ENCOUNTER — Ambulatory Visit (INDEPENDENT_AMBULATORY_CARE_PROVIDER_SITE_OTHER): Payer: Medicare HMO | Admitting: Family Medicine

## 2019-02-02 ENCOUNTER — Encounter: Payer: Self-pay | Admitting: Family Medicine

## 2019-02-02 ENCOUNTER — Other Ambulatory Visit: Payer: Self-pay

## 2019-02-02 VITALS — BP 96/62 | HR 93 | Temp 98.2°F | Wt 100.8 lb

## 2019-02-02 DIAGNOSIS — K859 Acute pancreatitis without necrosis or infection, unspecified: Secondary | ICD-10-CM

## 2019-02-02 DIAGNOSIS — K8681 Exocrine pancreatic insufficiency: Secondary | ICD-10-CM

## 2019-02-02 NOTE — Progress Notes (Signed)
   Subjective:    Patient ID: Deborah Travis, female    DOB: May 14, 1941, 78 y.o.   MRN: [id number]  HPI She is here for a post hospital evaluation.  She was admitted May 26 and sent home on the 29th.  She was treated for acute pancreatitis with CT evidence of the stone.  She also has a previous history of a Whipple procedure done to help with previous trouble with that.  Since her discharge she says that she notes the pain to be intermittent in nature and is usually controlled with 1 tramadol per day and only occasionally 2/day.  She does note a decrease sense of taste but states that this is secondary to the pneumonia she had last fall.  She is scheduled to see Dr. Penelope Coop on June 17.   Review of Systems     Objective:   Physical Exam Alert and in no distress.  Abdominal exam shows slight midepigastric tenderness with normal bowel sounds. The hospital record including lab work, CT, discharge summary and note from Dr. Watt Climes was reviewed.       Assessment & Plan:  Exocrine pancreatic insufficiency - Plan: CBC with Differential/Platelet, Comprehensive metabolic panel, Amylase, Lipase  Acute pancreatitis, unspecified complication status, unspecified pancreatitis type - Plan: CBC with Differential/Platelet, Comprehensive metabolic panel, Amylase, Lipase I went over the information with her concerning the CT scan.  I explained that at this point as long she seems to be handling the pain with 1 and possibly 2 tramadol, this is certainly appropriate.  I discussed the note from Dr. Watt Climes with her explaining that further evaluation and treatment might be necessary depending upon her level of pain. Also discussed keeping track of any particular foods that might make this worse.

## 2019-02-03 LAB — CBC WITH DIFFERENTIAL/PLATELET
Basophils Absolute: 0 10*3/uL (ref 0.0–0.2)
Basos: 0 %
EOS (ABSOLUTE): 0.1 10*3/uL (ref 0.0–0.4)
Eos: 1 %
Hematocrit: 41.1 % (ref 34.0–46.6)
Hemoglobin: 13.5 g/dL (ref 11.1–15.9)
Immature Grans (Abs): 0 10*3/uL (ref 0.0–0.1)
Immature Granulocytes: 0 %
Lymphocytes Absolute: 2.6 10*3/uL (ref 0.7–3.1)
Lymphs: 38 %
MCH: 30.6 pg (ref 26.6–33.0)
MCHC: 32.8 g/dL (ref 31.5–35.7)
MCV: 93 fL (ref 79–97)
Monocytes Absolute: 0.8 10*3/uL (ref 0.1–0.9)
Monocytes: 12 %
Neutrophils Absolute: 3.2 10*3/uL (ref 1.4–7.0)
Neutrophils: 49 %
Platelets: 375 10*3/uL (ref 150–450)
RBC: 4.41 x10E6/uL (ref 3.77–5.28)
RDW: 13.5 % (ref 11.7–15.4)
WBC: 6.7 10*3/uL (ref 3.4–10.8)

## 2019-02-03 LAB — COMPREHENSIVE METABOLIC PANEL
ALT: 26 IU/L (ref 0–32)
AST: 30 IU/L (ref 0–40)
Albumin/Globulin Ratio: 1.7 (ref 1.2–2.2)
Albumin: 4.3 g/dL (ref 3.7–4.7)
Alkaline Phosphatase: 114 IU/L (ref 39–117)
BUN/Creatinine Ratio: 27 (ref 12–28)
BUN: 31 mg/dL — ABNORMAL HIGH (ref 8–27)
Bilirubin Total: 0.4 mg/dL (ref 0.0–1.2)
CO2: 18 mmol/L — ABNORMAL LOW (ref 20–29)
Calcium: 9.8 mg/dL (ref 8.7–10.3)
Chloride: 102 mmol/L (ref 96–106)
Creatinine, Ser: 1.14 mg/dL — ABNORMAL HIGH (ref 0.57–1.00)
GFR calc Af Amer: 54 mL/min/{1.73_m2} — ABNORMAL LOW (ref >59)
GFR calc non Af Amer: 47 mL/min/{1.73_m2} — ABNORMAL LOW (ref >59)
Globulin, Total: 2.6 g/dL (ref 1.5–4.5)
Glucose: 152 mg/dL — ABNORMAL HIGH (ref 65–99)
Potassium: 4.9 mmol/L (ref 3.5–5.2)
Sodium: 138 mmol/L (ref 134–144)
Total Protein: 6.9 g/dL (ref 6.0–8.5)

## 2019-02-03 LAB — AMYLASE: Amylase: 44 U/L (ref 31–110)

## 2019-02-03 LAB — LIPASE: Lipase: 6 U/L — ABNORMAL LOW (ref 14–85)

## 2019-02-11 DIAGNOSIS — K861 Other chronic pancreatitis: Secondary | ICD-10-CM | POA: Diagnosis not present

## 2019-02-11 DIAGNOSIS — K859 Acute pancreatitis without necrosis or infection, unspecified: Secondary | ICD-10-CM | POA: Diagnosis not present

## 2019-02-25 NOTE — Progress Notes (Signed)
Office Visit Note  Patient: Deborah Travis             Date of Birth: June 20, 1941           MRN: [id number]             PCP: [name], MD Referring: [name], MD Visit Date: 03/10/2019 Occupation: @GUAROCC @  Subjective:  Medication management.   History of Present Illness: Deborah Travis is a 78 y.o. female with history of seronegative rheumatoid arthritis.  She states as regards to her rheumatoid arthritis she is doing well and taking Aleve on regular basis.  She states she was recently hospitalized with pancreatitis and gradually recovered from that.  She denies any joint pain or joint swelling.  Activities of Daily Living:  Patient reports morning stiffness for 0 minutes.   Patient Denies nocturnal pain.  Difficulty dressing/grooming: Denies Difficulty climbing stairs: Denies Difficulty getting out of chair: Denies Difficulty using hands for taps, buttons, cutlery, and/or writing: Reports  Review of Systems  Constitutional: Positive for fatigue. Negative for night sweats, weight gain and weight loss.  HENT: Negative for mouth sores, trouble swallowing, trouble swallowing, mouth dryness and nose dryness.   Eyes: Negative for pain, redness, itching, visual disturbance and dryness.  Respiratory: Negative for cough, shortness of breath, wheezing and difficulty breathing.   Cardiovascular: Negative for chest pain, palpitations, hypertension, irregular heartbeat and swelling in legs/feet.  Gastrointestinal: Negative for blood in stool, constipation and diarrhea.  Endocrine: Negative for increased urination.  Genitourinary: Negative for painful urination, pelvic pain and vaginal dryness.  Musculoskeletal: Negative for arthralgias, joint pain, joint swelling, myalgias, muscle weakness, morning stiffness, muscle tenderness and myalgias.  Skin: Negative for color change, rash, hair loss, redness, skin tightness, ulcers and sensitivity to sunlight.  Allergic/Immunologic:  Negative for susceptible to infections.  Neurological: Positive for weakness. Negative for dizziness, headaches, memory loss and night sweats.  Hematological: Positive for bruising/bleeding tendency. Negative for swollen glands.  Psychiatric/Behavioral: Negative for depressed mood, confusion and sleep disturbance. The patient is not nervous/anxious.     PMFS History:  Patient Active Problem List   Diagnosis Date Noted  . Abdominal pain 01/20/2019  . Acute pancreatitis 01/20/2019  . Atherosclerosis 08/08/2018  . Glaucoma 05/26/2018  . Hypertensive retinopathy 05/26/2018  . Seronegative rheumatoid arthritis ([place]) 03/04/2018  . Senile purpura ([place]) 02/01/2015  . Former smoker 10/12/2013  . History of peptic ulcer disease 07/13/2013  . Osteopenia 05/07/2013  . Hypothyroidism 01/12/2012  . Type 2 diabetes with complication ([place]) 07/37/1062  . Hypertension associated with diabetes ([place]) 05/21/2011  . Exocrine pancreatic insufficiency 05/21/2011  . Hyperlipidemia associated with type 2 diabetes mellitus ([place]) 05/21/2011    Past Medical History:  Diagnosis Date  . Chronic kidney disease    RENAL STONE  . Chronic pancreatitis ([place])   . Diabetes mellitus   . Duodenal ulcer   . Dyslipidemia   . GERD (gastroesophageal reflux disease)   . Hypertension   . Osteopenia   . Thyroid disease    HYPOTHYROID  . Vitamin D deficiency     Family History  Problem Relation Age of Onset  . Stroke Mother   . Heart disease Father   . Healthy Daughter   . Healthy Daughter    Past Surgical History:  Procedure Laterality Date  . APPENDECTOMY    . CATARACT EXTRACTION Right 2014  . CHOLECYSTECTOMY    . TONSILECTOMY, ADENOIDECTOMY, BILATERAL MYRINGOTOMY AND TUBES    . WHIPPLE  PROCEDURE  1998   Social History   Social History Narrative  . Not on file   Immunization History  Administered Date(s) Administered  . Influenza Split 05/21/2011, 06/12/2012, 07/15/2013  . Influenza, High Dose  Seasonal PF 07/13/2013, 07/01/2014, 06/01/2015, 06/15/2016, 04/15/2017, 05/19/2018  . Pneumococcal Conjugate-13 06/15/2016  . Pneumococcal Polysaccharide-23 06/12/2012  . Tdap 06/28/2011  . Zoster 06/27/2013     Objective: Vital Signs: BP 100/71 (BP Location: Left Arm, Patient Position: Sitting, Cuff Size: Normal)   Pulse 74   Resp 13   Ht 5' 2.75" (1.594 m)   Wt 101 lb 6.4 oz (46 kg)   BMI 18.11 kg/m    Physical Exam Vitals signs and nursing note reviewed.  Constitutional:      Appearance: She is well-developed.  HENT:     Head: Normocephalic and atraumatic.  Eyes:     Conjunctiva/sclera: Conjunctivae normal.  Neck:     Musculoskeletal: Normal range of motion.  Cardiovascular:     Rate and Rhythm: Normal rate and regular rhythm.     Heart sounds: Normal heart sounds.  Pulmonary:     Effort: Pulmonary effort is normal.     Breath sounds: Normal breath sounds.  Abdominal:     General: Bowel sounds are normal.     Palpations: Abdomen is soft.  Lymphadenopathy:     Cervical: No cervical adenopathy.  Skin:    General: Skin is warm and dry.     Capillary Refill: Capillary refill takes less than 2 seconds.  Neurological:     Mental Status: She is alert and oriented to person, place, and time.  Psychiatric:        Behavior: Behavior normal.      Musculoskeletal Exam: C-spine thoracic and lumbar spine with good range of motion.  Shoulder joints elbow joints wrist joint MCPs PIPs DIPs with good range of motion.  She has synovial thickening and some subluxation over bilateral first MCP joints.  Synovial thickening over MCP joints was noted.  PIP and DIP joint thickening was noted.  No synovitis was noted.  Hip joints, knee joints, ankles, MTPs PIPs DIPs with good range of motion with no synovitis.  Osteoarthritic changes were noted in her PIPs and DIPs of her feet.  CDAI Exam: CDAI Score: 0.2  Patient Global: 1 mm; Provider Global: 1 mm Swollen: 0 ; Tender: 0  Joint Exam    No joint exam has been documented for this visit   There is currently no information documented on the homunculus. Go to the Rheumatology activity and complete the homunculus joint exam.  Investigation: No additional findings.  Imaging: No results found.  Recent Labs: Lab Results  Component Value Date   WBC 6.7 02/02/2019   HGB 13.5 02/02/2019   PLT 375 02/02/2019   NA 138 02/02/2019   K 4.9 02/02/2019   CL 102 02/02/2019   CO2 18 (L) 02/02/2019   GLUCOSE 152 (H) 02/02/2019   BUN 31 (H) 02/02/2019   CREATININE 1.14 (H) 02/02/2019   BILITOT 0.4 02/02/2019   ALKPHOS 114 02/02/2019   AST 30 02/02/2019   ALT 26 02/02/2019   PROT 6.9 02/02/2019   ALBUMIN 4.3 02/02/2019   CALCIUM 9.8 02/02/2019   GFRAA 54 (L) 02/02/2019   QFTBGOLDPLUS Negative 12/29/2018    Speciality Comments: No specialty comments available.  Procedures:  No procedures performed Allergies: Levofloxacin, Codeine, and Morphine and related   Assessment / Plan:     Visit Diagnoses: Seronegative rheumatoid arthritis ([place]) -  RF-,CCP-, 14-3-3 eta negative, Uric acid 4.8, ANA negative, no erosive changes on XR of hands -she is clinically doing very well on Arava.  She had no synovitis on examination today.   High risk medication use -  Arava 20 mg daily.  Most recent CBC/CMP within normal limits except for elevated creatinine and decreased GFR in 02/02/2019.  Due for CBC/CMP in September and will monitor every 3 months.  Standing orders are in place.  She received the high-dose flu vaccine in September and is up-to-date with pneumonia vaccines.  She previously had Zostavax vaccine but recommend Shingrix vaccine as indicated for immunosuppressant therapy. D/c Enbrel in November 2019 due to developing pneumonia.   DDD (degenerative disc disease), cervical -she is currently not having much discomfort.  Osteopenia of multiple sites - Plan: Use of calcium vitamin D and resistive exercise was discussed.  Vitamin D  deficiency - Plan: She takes vitamin D supplement.  History of chronic pancreatitis -patient reports that she had recent hospitalization.  History of gastroesophageal reflux (GERD)   Hypertension associated with diabetes ([place])  Senile purpura ([place])  History of hypothyroidism   Hyperlipidemia associated with type 2 diabetes mellitus ([place]) - Plan:   History of peptic ulcer disease   Former smoker  Orders: No orders of the defined types were placed in this encounter.  Meds ordered this encounter  Medications  . leflunomide (ARAVA) 20 MG tablet    Sig: Take 1 tablet (20 mg total) by mouth daily.    Dispense:  90 tablet    Refill:  0    .  Follow-Up Instructions: Return in about 5 months (around 08/10/2019) for Rheumatoid arthritis.   [name], MD  Note - This record has been created using Editor, commissioning.  Chart creation errors have been sought, but may not always  have been located. Such creation errors do not reflect on  the standard of medical care.

## 2019-03-10 ENCOUNTER — Encounter: Payer: Self-pay | Admitting: Physician Assistant

## 2019-03-10 ENCOUNTER — Other Ambulatory Visit: Payer: Self-pay

## 2019-03-10 ENCOUNTER — Ambulatory Visit (INDEPENDENT_AMBULATORY_CARE_PROVIDER_SITE_OTHER): Payer: Medicare HMO | Admitting: Rheumatology

## 2019-03-10 VITALS — BP 100/71 | HR 74 | Resp 13 | Ht 62.75 in | Wt 101.4 lb

## 2019-03-10 DIAGNOSIS — M5030 Other cervical disc degeneration, unspecified cervical region: Secondary | ICD-10-CM | POA: Diagnosis not present

## 2019-03-10 DIAGNOSIS — Z8639 Personal history of other endocrine, nutritional and metabolic disease: Secondary | ICD-10-CM | POA: Diagnosis not present

## 2019-03-10 DIAGNOSIS — M0600 Rheumatoid arthritis without rheumatoid factor, unspecified site: Secondary | ICD-10-CM | POA: Diagnosis not present

## 2019-03-10 DIAGNOSIS — E559 Vitamin D deficiency, unspecified: Secondary | ICD-10-CM | POA: Diagnosis not present

## 2019-03-10 DIAGNOSIS — Z8711 Personal history of peptic ulcer disease: Secondary | ICD-10-CM

## 2019-03-10 DIAGNOSIS — Z8719 Personal history of other diseases of the digestive system: Secondary | ICD-10-CM

## 2019-03-10 DIAGNOSIS — D692 Other nonthrombocytopenic purpura: Secondary | ICD-10-CM

## 2019-03-10 DIAGNOSIS — E1159 Type 2 diabetes mellitus with other circulatory complications: Secondary | ICD-10-CM

## 2019-03-10 DIAGNOSIS — Z87891 Personal history of nicotine dependence: Secondary | ICD-10-CM

## 2019-03-10 DIAGNOSIS — Z79899 Other long term (current) drug therapy: Secondary | ICD-10-CM | POA: Diagnosis not present

## 2019-03-10 DIAGNOSIS — E785 Hyperlipidemia, unspecified: Secondary | ICD-10-CM

## 2019-03-10 DIAGNOSIS — I152 Hypertension secondary to endocrine disorders: Secondary | ICD-10-CM

## 2019-03-10 DIAGNOSIS — M8589 Other specified disorders of bone density and structure, multiple sites: Secondary | ICD-10-CM | POA: Diagnosis not present

## 2019-03-10 DIAGNOSIS — I10 Essential (primary) hypertension: Secondary | ICD-10-CM

## 2019-03-10 DIAGNOSIS — E1169 Type 2 diabetes mellitus with other specified complication: Secondary | ICD-10-CM

## 2019-03-10 MED ORDER — LEFLUNOMIDE 20 MG PO TABS: 20.0000 mg | ORAL_TABLET | Freq: Every day | ORAL | 0 refills | Status: DC

## 2019-03-10 NOTE — Patient Instructions (Signed)
Standing Labs We placed an order today for your standing lab work.    Please come back and get your standing labs in August and every 3 months  We have open lab daily Monday through Thursday from 8:30-12:30 PM and 1:30-4:30 PM and Friday from 8:30-12:30 PM and 1:30 -4:00 PM at the office of Dr. Leighla Chestnutt.   You may experience shorter wait times on Monday and Friday afternoons. The office is located at [address], [address], [place], [place] [address] No appointment is necessary.   Labs are drawn by [facility].  You may receive a bill from [facility] for your lab work.  If you wish to have your labs drawn at another location, please call the office 24 hours in advance to send orders.  If you have any questions regarding directions or hours of operation,  please call [phone].   Just as a reminder please drink plenty of water prior to coming for your lab work. Thanks!   

## 2019-03-30 ENCOUNTER — Other Ambulatory Visit: Payer: Self-pay

## 2019-03-30 DIAGNOSIS — Z79899 Other long term (current) drug therapy: Secondary | ICD-10-CM

## 2019-03-31 DIAGNOSIS — H02822 Cysts of right lower eyelid: Secondary | ICD-10-CM | POA: Diagnosis not present

## 2019-03-31 DIAGNOSIS — H0014 Chalazion left upper eyelid: Secondary | ICD-10-CM | POA: Diagnosis not present

## 2019-03-31 NOTE — Progress Notes (Signed)
CBC WNL.  Creatinine is elevated at 1.06.  GFR improved-51.  Please advise patient to avoid  NSAIDs. Glucose is elevated-173.  We will continue to monitor.

## 2019-04-01 LAB — CBC WITH DIFFERENTIAL/PLATELET
Absolute Monocytes: 525 cells/uL (ref 200–950)
Basophils Absolute: 28 cells/uL (ref 0–200)
Basophils Relative: 0.4 %
Eosinophils Absolute: 119 cells/uL (ref 15–500)
Eosinophils Relative: 1.7 %
HCT: 39.1 % (ref 35.0–45.0)
Hemoglobin: 12.8 g/dL (ref 11.7–15.5)
Lymphs Abs: 2849 cells/uL (ref 850–3900)
MCH: 31.4 pg (ref 27.0–33.0)
MCHC: 32.7 g/dL (ref 32.0–36.0)
MCV: 96.1 fL (ref 80.0–100.0)
MPV: 10.6 fL (ref 7.5–12.5)
Monocytes Relative: 7.5 %
Neutro Abs: 3479 cells/uL (ref 1500–7800)
Neutrophils Relative %: 49.7 %
Platelets: 282 10*3/uL (ref 140–400)
RBC: 4.07 10*6/uL (ref 3.80–5.10)
RDW: 12.9 % (ref 11.0–15.0)
Total Lymphocyte: 40.7 %
WBC: 7 10*3/uL (ref 3.8–10.8)

## 2019-04-01 LAB — QUANTIFERON-TB GOLD PLUS
Mitogen-NIL: >10 IU/mL
NIL: 0.03 IU/mL
QuantiFERON-TB Gold Plus: NEGATIVE
TB1-NIL: 0 IU/mL
TB2-NIL: 0 IU/mL

## 2019-04-01 LAB — COMPLETE METABOLIC PANEL WITH GFR
AG Ratio: 1.5 (calc) (ref 1.0–2.5)
ALT: 27 U/L (ref 6–29)
AST: 27 U/L (ref 10–35)
Albumin: 3.8 g/dL (ref 3.6–5.1)
Alkaline phosphatase (APISO): 101 U/L (ref 37–153)
BUN/Creatinine Ratio: 16 (calc) (ref 6–22)
BUN: 17 mg/dL (ref 7–25)
CO2: 19 mmol/L — ABNORMAL LOW (ref 20–32)
Calcium: 8.9 mg/dL (ref 8.6–10.4)
Chloride: 106 mmol/L (ref 98–110)
Creat: 1.06 mg/dL — ABNORMAL HIGH (ref 0.60–0.93)
GFR, Est African American: 59 mL/min/{1.73_m2} — ABNORMAL LOW (ref >=60)
GFR, Est Non African American: 51 mL/min/{1.73_m2} — ABNORMAL LOW (ref >=60)
Globulin: 2.6 g/dL (calc) (ref 1.9–3.7)
Glucose, Bld: 173 mg/dL — ABNORMAL HIGH (ref 65–99)
Potassium: 4.7 mmol/L (ref 3.5–5.3)
Sodium: 136 mmol/L (ref 135–146)
Total Bilirubin: 0.4 mg/dL (ref 0.2–1.2)
Total Protein: 6.4 g/dL (ref 6.1–8.1)

## 2019-04-01 NOTE — Progress Notes (Signed)
TB gold negative

## 2019-04-29 ENCOUNTER — Other Ambulatory Visit: Payer: Self-pay

## 2019-04-29 ENCOUNTER — Telehealth: Payer: Self-pay | Admitting: Rheumatology

## 2019-04-29 ENCOUNTER — Ambulatory Visit (INDEPENDENT_AMBULATORY_CARE_PROVIDER_SITE_OTHER): Payer: Medicare HMO | Admitting: Family Medicine

## 2019-04-29 ENCOUNTER — Encounter: Payer: Self-pay | Admitting: Family Medicine

## 2019-04-29 VITALS — BP 128/80 | HR 78 | Temp 97.0°F | Wt 105.2 lb

## 2019-04-29 DIAGNOSIS — H0014 Chalazion left upper eyelid: Secondary | ICD-10-CM

## 2019-04-29 DIAGNOSIS — E785 Hyperlipidemia, unspecified: Secondary | ICD-10-CM

## 2019-04-29 DIAGNOSIS — M0600 Rheumatoid arthritis without rheumatoid factor, unspecified site: Secondary | ICD-10-CM | POA: Diagnosis not present

## 2019-04-29 DIAGNOSIS — I10 Essential (primary) hypertension: Secondary | ICD-10-CM

## 2019-04-29 DIAGNOSIS — E118 Type 2 diabetes mellitus with unspecified complications: Secondary | ICD-10-CM | POA: Diagnosis not present

## 2019-04-29 DIAGNOSIS — E1159 Type 2 diabetes mellitus with other circulatory complications: Secondary | ICD-10-CM | POA: Diagnosis not present

## 2019-04-29 DIAGNOSIS — E1169 Type 2 diabetes mellitus with other specified complication: Secondary | ICD-10-CM

## 2019-04-29 DIAGNOSIS — E039 Hypothyroidism, unspecified: Secondary | ICD-10-CM | POA: Diagnosis not present

## 2019-04-29 DIAGNOSIS — I152 Hypertension secondary to endocrine disorders: Secondary | ICD-10-CM

## 2019-04-29 LAB — POCT GLYCOSYLATED HEMOGLOBIN (HGB A1C): Hemoglobin A1C: 6.7 % — AB (ref 4.0–5.6)

## 2019-04-29 NOTE — Telephone Encounter (Signed)
Patient advised she may get the flu vaccination and it is highly encouraged. Patient verbalized understanding.

## 2019-04-29 NOTE — Progress Notes (Signed)
  Subjective:    Patient ID: Deborah Travis, female    DOB: 02-02-1941, 78 y.o.   MRN: [id number]  Deborah Travis is a 78 y.o. female who presents for follow-up of Type 2 diabetes mellitus.  Patient is checking home blood sugars.   Home blood sugar records: BGs are running  consistent with Hgb A1C How often is blood sugars being checked: daily Current symptoms/problems include none and have been unchanged. Daily foot checks: yes   Any foot concerns: no Last eye exam: soon.  She has an appointment to evaluate and treat the chalazion that has been bothering her for the last several months. Exercise: walking She is on multiple supplements.  Continues on pravastatin as well as metformin, Synthroid and Arava.  She is followed by rheumatology for this.  She has gained some weight recently and is happy with that. The following portions of the patient's history were reviewed and updated as appropriate: allergies, current medications, past medical history, past social history and problem list. ROS as in subjective above.     Objective:    Physical Exam Alert and in no distress 1 cm round lesion noted on the midportion of the left upper eyelid.  Cornea conjunctive appear normal.   Lab Review Diabetic Labs Latest Ref Rng & Units 03/30/2019 02/02/2019 01/22/2019 01/21/2019 01/20/2019  HbA1c 4.0 - 5.6 % - - - - -  Microalbumin mg/L - - - - -  Micro/Creat Ratio - - - - - -  Chol 100 - 199 mg/dL - - - - -  HDL >39 mg/dL - - - - -  Calc LDL 0 - 99 mg/dL - - - - -  Triglycerides 0 - 149 mg/dL - - - - -  Creatinine 0.60 - 0.93 mg/dL 1.06(H) 1.14(H) 0.82 0.79 1.09(H)   BP/Weight 03/10/2019 02/02/2019 01/23/2019 [id number]  Systolic BP 123XX123 96 [id number]  Diastolic BP 71 62 79 - 72  Wt. (Lbs) 101.4 100.8 - 105 105.2  BMI 18.11 17.86 - 18.6 18.78   Foot/eye exam completion dates Latest Ref Rng & Units 12/29/2018 05/20/2018  Eye Exam No Retinopathy - No Retinopathy  Foot Form Completion - Done -   Hemoglobin A1c is 6.7  Deborah Travis  reports that she quit smoking about 3 years ago. She smoked 0.10 packs per day. She has never used smokeless tobacco. She reports that she does not drink alcohol or use drugs.     Assessment & Plan:    Type 2 diabetes with complication (H[facility])  Hypertension associated with diabetes ([place])  Hyperlipidemia associated with type 2 diabetes mellitus (H[facility])  Hypothyroidism, unspecified type  Seronegative rheumatoid arthritis ([place])  Chalazion left upper eyelid 1.  2. Rx changes: none 3. Education: Reviewed 'ABCs' of diabetes management (respective goals in parentheses):  A1C (<7), blood pressure (<130/80), and cholesterol (LDL <100). 4. Compliance at present is estimated to be excellent. Efforts to improve compliance (if necessary) will be directed at No change. 5. Follow up: 4 months I congratulated her on her weight change She will check with her rheumatologist concerning getting Shingrix as well as the flu shot.

## 2019-04-29 NOTE — Telephone Encounter (Signed)
Patient called stating she had an appointment with her PCP Dr. Redmond School today.  Patient states she wanted to have the flu vaccine, but Dr. Redmond School wanted her to check with Dr. Estanislado Pandy to see if it is okay to take it since she is on Leflunomide.  Patient is requesting a return call.

## 2019-04-29 NOTE — Addendum Note (Signed)
Addended by: Elyse Jarvis on: 04/29/2019 11:19 AM   Modules accepted: Orders

## 2019-05-01 ENCOUNTER — Ambulatory Visit: Payer: Medicare HMO | Admitting: Family Medicine

## 2019-05-01 DIAGNOSIS — H0014 Chalazion left upper eyelid: Secondary | ICD-10-CM | POA: Diagnosis not present

## 2019-06-01 ENCOUNTER — Other Ambulatory Visit: Payer: Self-pay

## 2019-06-01 ENCOUNTER — Other Ambulatory Visit (INDEPENDENT_AMBULATORY_CARE_PROVIDER_SITE_OTHER): Payer: Medicare HMO

## 2019-06-01 DIAGNOSIS — Z23 Encounter for immunization: Secondary | ICD-10-CM | POA: Diagnosis not present

## 2019-06-03 ENCOUNTER — Telehealth: Payer: Self-pay | Admitting: Family Medicine

## 2019-06-03 NOTE — Telephone Encounter (Signed)
Pt left voice mail message that she needed refill on Tramadol.  Please let her know if refilled [phone]

## 2019-06-04 MED ORDER — TRAMADOL HCL 50 MG PO TABS: 50.0000 mg | ORAL_TABLET | Freq: Four times a day (QID) | ORAL | 0 refills | Status: DC | PRN

## 2019-06-12 DIAGNOSIS — Z20828 Contact with and (suspected) exposure to other viral communicable diseases: Secondary | ICD-10-CM | POA: Diagnosis not present

## 2019-06-16 ENCOUNTER — Telehealth: Payer: Self-pay | Admitting: Family Medicine

## 2019-06-16 DIAGNOSIS — E119 Type 2 diabetes mellitus without complications: Secondary | ICD-10-CM

## 2019-06-16 MED ORDER — METFORMIN HCL 1000 MG PO TABS: ORAL_TABLET | ORAL | 1 refills | Status: DC

## 2019-06-16 NOTE — Telephone Encounter (Signed)
Pt left message and needs refill on Metformin sent to [facility]

## 2019-07-02 ENCOUNTER — Other Ambulatory Visit: Payer: Self-pay

## 2019-07-02 ENCOUNTER — Telehealth: Payer: Self-pay | Admitting: Rheumatology

## 2019-07-02 DIAGNOSIS — Z79899 Other long term (current) drug therapy: Secondary | ICD-10-CM

## 2019-07-02 DIAGNOSIS — M0600 Rheumatoid arthritis without rheumatoid factor, unspecified site: Secondary | ICD-10-CM

## 2019-07-02 MED ORDER — LEFLUNOMIDE 20 MG PO TABS: 20.0000 mg | ORAL_TABLET | Freq: Every day | ORAL | 0 refills | Status: DC

## 2019-07-02 NOTE — Telephone Encounter (Signed)
Last Visit: 03/10/19 Next visit: 08/12/19  Labs: 03/30/19 CBC WNL. Creatinine is elevated at 1.06. GFR improved-51.   Patient updated labs 07/02/19  Okay to refill per Dr. Estanislado Pandy

## 2019-07-02 NOTE — Telephone Encounter (Signed)
Patient request refill on Leflunomide sent to [facility] mail order. Patient had lab work today.

## 2019-07-03 LAB — COMPLETE METABOLIC PANEL WITH GFR
AG Ratio: 1.4 (calc) (ref 1.0–2.5)
ALT: 34 U/L — ABNORMAL HIGH (ref 6–29)
AST: 33 U/L (ref 10–35)
Albumin: 3.8 g/dL (ref 3.6–5.1)
Alkaline phosphatase (APISO): 128 U/L (ref 37–153)
BUN/Creatinine Ratio: 17 (calc) (ref 6–22)
BUN: 17 mg/dL (ref 7–25)
CO2: 18 mmol/L — ABNORMAL LOW (ref 20–32)
Calcium: 9.2 mg/dL (ref 8.6–10.4)
Chloride: 106 mmol/L (ref 98–110)
Creat: 1.02 mg/dL — ABNORMAL HIGH (ref 0.60–0.93)
GFR, Est African American: 62 mL/min/{1.73_m2} (ref >=60)
GFR, Est Non African American: 54 mL/min/{1.73_m2} — ABNORMAL LOW (ref >=60)
Globulin: 2.7 g/dL (calc) (ref 1.9–3.7)
Glucose, Bld: 233 mg/dL — ABNORMAL HIGH (ref 65–99)
Potassium: 4.5 mmol/L (ref 3.5–5.3)
Sodium: 134 mmol/L — ABNORMAL LOW (ref 135–146)
Total Bilirubin: 0.3 mg/dL (ref 0.2–1.2)
Total Protein: 6.5 g/dL (ref 6.1–8.1)

## 2019-07-03 LAB — CBC WITH DIFFERENTIAL/PLATELET
Absolute Monocytes: 598 cells/uL (ref 200–950)
Basophils Absolute: 26 cells/uL (ref 0–200)
Basophils Relative: 0.3 %
Eosinophils Absolute: 88 cells/uL (ref 15–500)
Eosinophils Relative: 1 %
HCT: 38 % (ref 35.0–45.0)
Hemoglobin: 12.5 g/dL (ref 11.7–15.5)
Lymphs Abs: 2904 cells/uL (ref 850–3900)
MCH: 31.3 pg (ref 27.0–33.0)
MCHC: 32.9 g/dL (ref 32.0–36.0)
MCV: 95 fL (ref 80.0–100.0)
MPV: 10 fL (ref 7.5–12.5)
Monocytes Relative: 6.8 %
Neutro Abs: 5183 cells/uL (ref 1500–7800)
Neutrophils Relative %: 58.9 %
Platelets: 356 10*3/uL (ref 140–400)
RBC: 4 10*6/uL (ref 3.80–5.10)
RDW: 12.7 % (ref 11.0–15.0)
Total Lymphocyte: 33 %
WBC: 8.8 10*3/uL (ref 3.8–10.8)

## 2019-07-03 NOTE — Progress Notes (Signed)
Glucose is elevated.  GFR is low but is stable.  Is a mildly elevated probably due to statin use.  Please forward labs to her PCP.

## 2019-07-30 ENCOUNTER — Telehealth: Payer: Self-pay | Admitting: Family Medicine

## 2019-07-30 ENCOUNTER — Other Ambulatory Visit: Payer: Self-pay

## 2019-07-30 DIAGNOSIS — E118 Type 2 diabetes mellitus with unspecified complications: Secondary | ICD-10-CM

## 2019-07-30 MED ORDER — GLUCOSE BLOOD VI STRP: ORAL_STRIP | 3 refills | Status: DC

## 2019-07-30 NOTE — Telephone Encounter (Signed)
Pt was advised Kh 

## 2019-07-30 NOTE — Telephone Encounter (Signed)
Pt called and is requesting  A refill on her test strips please send to [place], [place] and pt can be reached at [phone]

## 2019-07-31 ENCOUNTER — Other Ambulatory Visit: Payer: Self-pay | Admitting: *Deleted

## 2019-07-31 DIAGNOSIS — D472 Monoclonal gammopathy: Secondary | ICD-10-CM

## 2019-08-03 ENCOUNTER — Inpatient Hospital Stay (HOSPITAL_BASED_OUTPATIENT_CLINIC_OR_DEPARTMENT_OTHER): Payer: Medicare HMO | Admitting: Hematology

## 2019-08-03 ENCOUNTER — Inpatient Hospital Stay: Payer: Medicare HMO | Attending: Hematology

## 2019-08-03 ENCOUNTER — Telehealth: Payer: Self-pay | Admitting: Hematology

## 2019-08-03 ENCOUNTER — Other Ambulatory Visit: Payer: Self-pay

## 2019-08-03 VITALS — BP 101/67 | HR 78 | Temp 97.8°F | Resp 18 | Ht 62.75 in | Wt 103.8 lb

## 2019-08-03 DIAGNOSIS — M0600 Rheumatoid arthritis without rheumatoid factor, unspecified site: Secondary | ICD-10-CM | POA: Insufficient documentation

## 2019-08-03 DIAGNOSIS — D472 Monoclonal gammopathy: Secondary | ICD-10-CM | POA: Insufficient documentation

## 2019-08-03 DIAGNOSIS — Z87891 Personal history of nicotine dependence: Secondary | ICD-10-CM | POA: Diagnosis not present

## 2019-08-03 DIAGNOSIS — R771 Abnormality of globulin: Secondary | ICD-10-CM | POA: Insufficient documentation

## 2019-08-03 DIAGNOSIS — B370 Candidal stomatitis: Secondary | ICD-10-CM | POA: Insufficient documentation

## 2019-08-03 LAB — CMP (CANCER CENTER ONLY)
ALT: 31 U/L (ref 0–44)
AST: 30 U/L (ref 15–41)
Albumin: 3.8 g/dL (ref 3.5–5.0)
Alkaline Phosphatase: 122 U/L (ref 38–126)
Anion gap: 10 (ref 5–15)
BUN: 15 mg/dL (ref 8–23)
CO2: 18 mmol/L — ABNORMAL LOW (ref 22–32)
Calcium: 8.6 mg/dL — ABNORMAL LOW (ref 8.9–10.3)
Chloride: 109 mmol/L (ref 98–111)
Creatinine: 1 mg/dL (ref 0.44–1.00)
GFR, Est AFR Am: >60 mL/min (ref >60)
GFR, Estimated: 55 mL/min — ABNORMAL LOW (ref >60)
Glucose, Bld: 81 mg/dL (ref 70–99)
Potassium: 3.9 mmol/L (ref 3.5–5.1)
Sodium: 137 mmol/L (ref 135–145)
Total Bilirubin: 0.4 mg/dL (ref 0.3–1.2)
Total Protein: 7 g/dL (ref 6.5–8.1)

## 2019-08-03 LAB — CBC WITH DIFFERENTIAL (CANCER CENTER ONLY)
Abs Immature Granulocytes: 0 10*3/uL (ref 0.00–0.07)
Basophils Absolute: 0 10*3/uL (ref 0.0–0.1)
Basophils Relative: 0 %
Eosinophils Absolute: 0.2 10*3/uL (ref 0.0–0.5)
Eosinophils Relative: 2 %
HCT: 38.7 % (ref 36.0–46.0)
Hemoglobin: 12.7 g/dL (ref 12.0–15.0)
Immature Granulocytes: 0 %
Lymphocytes Relative: 35 %
Lymphs Abs: 2.7 10*3/uL (ref 0.7–4.0)
MCH: 31.5 pg (ref 26.0–34.0)
MCHC: 32.8 g/dL (ref 30.0–36.0)
MCV: 96 fL (ref 80.0–100.0)
Monocytes Absolute: 0.7 10*3/uL (ref 0.1–1.0)
Monocytes Relative: 9 %
Neutro Abs: 4.1 10*3/uL (ref 1.7–7.7)
Neutrophils Relative %: 54 %
Platelet Count: 262 10*3/uL (ref 150–400)
RBC: 4.03 MIL/uL (ref 3.87–5.11)
RDW: 14.8 % (ref 11.5–15.5)
WBC Count: 7.6 10*3/uL (ref 4.0–10.5)
nRBC: 0 % (ref 0.0–0.2)

## 2019-08-03 NOTE — Progress Notes (Signed)
HEMATOLOGY/ONCOLOGY CLINIC NOTE  Date of Service: 08/03/2019    Patient Care Team: [name], MD as PCP - General (Family Medicine)  CHIEF COMPLAINTS/PURPOSE OF CONSULTATION:  Abnormal IFE   HISTORY OF PRESENTING ILLNESS:   Deborah Travis is a wonderful 78 y.o. female who has been referred to [place] by Dr. Jill Alexanders for evaluation and management of Abnormal IFE. She is accompanied today by her daughter. The pt reports that she is doing well overall.   The pt reports that she had a stiff neck a couple months ago, and began noticing worsening joint pain in her knees, shoulders, wrists and fingers in the last few weeks.  She notes that her joints swelled and that her morning stiffness lasts 2-3 hours. She was referred to rheumatologist t Dr. [name] in the last month who diagnosed her with seronegative rheumatoid arthritis. She has been taking a Prednisone taper and started 56m Arava recently, and notes that she is just beginning to experience some relief from this joint pain.   Most recent lab results (02/19/18) of IgG, IgA, IgM is as follows: all values are WNL except for IgM at 26. CBC w/diff on 03/18/18 showed all values WNL.   On review of systems, pt reports stiff/swollen joints that are improving, resolved neck pain, good energy levels and denies abdominal pains, fevers, chills, and any other symptoms.   On PMHx the pt reports Seronegative rheumatoid arthritis taking prednisone and Arava.   Interval History:  Deborah MCCASLANDreturns today for management and evaluation of her MGUS. The patient's last visit with [place] on 07/[place]. The pt reports that she is doing well overall.  The pt reports that she had Gallstone Pancreatitis in March and was hospitalized. That has since resolved and she continues to take pancreatic enzyme replacement. Pt is seeing Dr. DEstanislado Pandyfor her RA, which has been well-controlled in the interim. She has been taking Leflunomide as prescribed.  Pt can taste salty and sweet items but otherwise has lost some sense of taste.   Lab results today (08/03/19) of CBC w/diff and CMP is as follows: all values are WNL except for CO2 at 18, Calcium at 8.6, GFR Est Non Af Am at 55. 08/03/2019 MMP is in no M spike,IFE unremarkable  On review of systems, pt reports dysgeusia and denies unexpected weight loss, headaches, oral thrush, new bone pains, diarrhea, bowel movement issues, leg swelling and any other symptoms.   MEDICAL HISTORY:  Past Medical History:  Diagnosis Date  . Chronic kidney disease    RENAL STONE  . Chronic pancreatitis ([place]   . Diabetes mellitus   . Duodenal ulcer   . Dyslipidemia   . GERD (gastroesophageal reflux disease)   . Hypertension   . Osteopenia   . Thyroid disease    HYPOTHYROID  . Vitamin D deficiency     SURGICAL HISTORY: Past Surgical History:  Procedure Laterality Date  . APPENDECTOMY    . CATARACT EXTRACTION Right 2014  . CHOLECYSTECTOMY    . TONSILECTOMY, ADENOIDECTOMY, BILATERAL MYRINGOTOMY AND TUBES    . WHIPPLE PROCEDURE  1998    SOCIAL HISTORY: Social History   Socioeconomic History  . Marital status: Single    Spouse name: Not on file  . Number of children: Not on file  . Years of education: Not on file  . Highest education level: Not on file  Occupational History  . Not on file  Social Needs  . Financial resource strain: Not  on file  . Food insecurity    Worry: Not on file    Inability: Not on file  . Transportation needs    Medical: Not on file    Non-medical: Not on file  Tobacco Use  . Smoking status: Former Smoker    Packs/day: 0.10    Quit date: 10/31/2015    Years since quitting: 3.7  . Smokeless tobacco: Never Used  Substance and Sexual Activity  . Alcohol use: No    Alcohol/week: 0.0 standard drinks  . Drug use: No  . Sexual activity: Not Currently  Lifestyle  . Physical activity    Days per week: Not on file    Minutes per session: Not on file  . Stress:  Not on file  Relationships  . Social Herbalist on phone: Not on file    Gets together: Not on file    Attends religious service: Not on file    Active member of club or organization: Not on file    Attends meetings of clubs or organizations: Not on file    Relationship status: Not on file  . Intimate partner violence    Fear of current or ex partner: Not on file    Emotionally abused: Not on file    Physically abused: Not on file    Forced sexual activity: Not on file  Other Topics Concern  . Not on file  Social History Narrative  . Not on file    FAMILY HISTORY: Family History  Problem Relation Age of Onset  . Stroke Mother   . Heart disease Father   . Healthy Daughter   . Healthy Daughter     ALLERGIES:  is allergic to levofloxacin; codeine; and morphine and related.  MEDICATIONS:  Current Outpatient Medications  Medication Sig Dispense Refill  . ACCU-CHEK FASTCLIX LANCETS MISC 1 Device by Does not apply route 2 (two) times daily. 15 each 3  . Alcohol Swabs (B-D SINGLE USE SWABS BUTTERFLY) PADS 1 each by Does not apply route 2 (two) times daily. 300 each 3  . b complex vitamins capsule Take 1 capsule by mouth daily.    . Blood Glucose Calibration (ACCU-CHEK AVIVA) SOLN Use as directed 3 each 0  . Blood Glucose Monitoring Suppl (ACCU-CHEK AVIVA PLUS) w/Device KIT Use as directed Patient is to test BID DX E11.9 1 kit 0  . cholecalciferol (VITAMIN D3) 25 MCG (1000 UT) tablet Take 1,000 Units by mouth 2 (two) times a day.    . Coenzyme Q10 (CO Q 10 PO) Take 1 tablet by mouth daily.     Marland Kitchen glucose blood test strip THIS IS FOR Accucheck Aviva Plus Use as instructed PATIENT IS TO TEST ONE TIME A DAY DX: E11.9 100 each 3  . leflunomide (ARAVA) 20 MG tablet Take 1 tablet (20 mg total) by mouth daily. 90 tablet 0  . levothyroxine (SYNTHROID) 100 MCG tablet Take 1 tablet (100 mcg total) by mouth daily. 90 tablet 3  . lisinopril (ZESTRIL) 10 MG tablet Take 1 tablet (10 mg  total) by mouth daily. 90 tablet 3  . metFORMIN (GLUCOPHAGE) 1000 MG tablet One twice a day 180 tablet 1  . Multiple Vitamin (MULITIVITAMIN WITH MINERALS) TABS Take 1 tablet by mouth daily.    Marland Kitchen OVER THE COUNTER MEDICATION daily.    . pravastatin (PRAVACHOL) 20 MG tablet Take 1 tablet (20 mg total) by mouth daily. 90 tablet 3  . traMADol (ULTRAM) 50 MG tablet Take  1 tablet (50 mg total) by mouth every 6 (six) hours as needed. 30 tablet 0  . VITAMIN A PO Take 1 tablet by mouth daily.     Marland Kitchen VITAMIN E COMPLEX PO Take 1 capsule by mouth daily.     . Zinc Sulfate (ZINC 15 PO) Take 15 mg by mouth daily.     No current facility-administered medications for this visit.     REVIEW OF SYSTEMS:   A 10+ POINT REVIEW OF SYSTEMS WAS OBTAINED including neurology, dermatology, psychiatry, cardiac, respiratory, lymph, extremities, GI, GU, Musculoskeletal, constitutional, breasts, reproductive, HEENT.  All pertinent positives are noted in the HPI.  All others are negative.   PHYSICAL EXAMINATION:  . Vitals:   08/03/19 1227  BP: 101/67  Pulse: 78  Resp: 18  Temp: 97.8 F (36.6 C)  SpO2: 99%   Filed Weights   08/03/19 1227  Weight: 103 lb 12.8 oz (47.1 kg)   .Body mass index is 18.53 kg/m.  GENERAL:alert, in no acute distress and comfortable SKIN: no acute rashes, no significant lesions EYES: conjunctiva are pink and non-injected, sclera anicteric OROPHARYNX: MMM, no exudates, no oropharyngeal erythema or ulceration NECK: supple, no JVD LYMPH:  no palpable lymphadenopathy in the cervical, axillary or inguinal regions LUNGS: clear to auscultation b/l with normal respiratory effort HEART: regular rate & rhythm ABDOMEN:  normoactive bowel sounds , non tender, not distended. No palpable hepatosplenomegaly.  Extremity: no pedal edema PSYCH: alert & oriented x 3 with fluent speech NEURO: no focal motor/sensory deficits  LABORATORY DATA:  I have reviewed the data as listed  . CBC Latest Ref  Rng & Units 08/03/2019 07/02/2019 03/30/2019  WBC 4.0 - 10.5 K/uL 7.6 8.8 7.0  Hemoglobin 12.0 - 15.0 g/dL 12.7 12.5 12.8  Hematocrit 36.0 - 46.0 % 38.7 38.0 39.1  Platelets 150 - 400 K/uL 262 356 282    . CMP Latest Ref Rng & Units 08/03/2019 07/02/2019 03/30/2019  Glucose 70 - 99 mg/dL 81 233(H) 173(H)  BUN 8 - 23 mg/dL 15 17 17   Creatinine 0.44 - 1.00 mg/dL 1.00 1.02(H) 1.06(H)  Sodium 135 - 145 mmol/L 137 134(L) 136  Potassium 3.5 - 5.1 mmol/L 3.9 4.5 4.7  Chloride 98 - 111 mmol/L 109 106 106  CO2 22 - 32 mmol/L 18(L) 18(L) 19(L)  Calcium 8.9 - 10.3 mg/dL 8.6(L) 9.2 8.9  Total Protein 6.5 - 8.1 g/dL 7.0 6.5 6.4  Total Bilirubin 0.3 - 1.2 mg/dL 0.4 0.3 0.4  Alkaline Phos 38 - 126 U/L 122 - -  AST 15 - 41 U/L 30 33 27  ALT 0 - 44 U/L 31 34(H) 27     Component     Latest Ref Rng & Units 02/19/2018  Hepatitis C Ab     NON-REACTI NON-REACTIVE  SIGNAL TO CUT-OFF     <1.00 0.01  Hep B Core Ab, IgM     NON-REACTI NON-REACTIVE  Hepatitis B Surface Ag     NON-REACTI NON-REACTIVE  HIV     NON-REACTI NON-REACTIVE       RADIOGRAPHIC STUDIES: I have personally reviewed the radiological images as listed and agreed with the findings in the report. No results found.  ASSESSMENT & PLAN:  78 y.o. female with  1. Abnormal IFE with abnormal band in gammaglobin fraction -- likely related to significant inflammation from recently  Diagnosed seronegative RA  2. Thrush - oral  PLAN: -Discussed pt labwork today, 08/03/19; blood counts look good, blood chemistries are stable.  -  Discussed 08/03/2019 MMP with no M spike and normal IFE -No clonal pattern indicated by [facility] -no indication for additional w/u or labs from hematology standpoint -Continue f/u with Rheumatology to optimize rx for RA -Will see back as needed     FOLLOW UP: RTC with Dr. Irene Limbo as needed  The total time spent in the appt was 15 minutes and more than 50% was on counseling and direct patient cares.  All of the  patient's questions were answered with apparent satisfaction. The patient knows to call the clinic with any problems, questions or concerns.    Sullivan Lone MD [place] AAHIVMS [facility] Hematology/Oncology Physician [facility]  (Office):       [phone] (Work cell):  [phone] (Fax):           [phone]  08/03/2019 1:26 PM  I, Yevette Edwards, am acting as a scribe for Dr. Sullivan Lone.   .I have reviewed the above documentation for accuracy and completeness, and I agree with the above. Brunetta Genera MD

## 2019-08-03 NOTE — Telephone Encounter (Signed)
Per 1/27 los. RTC with Dr Kale as needed 

## 2019-08-05 LAB — MULTIPLE MYELOMA PANEL, SERUM
Albumin SerPl Elph-Mcnc: 3.6 g/dL (ref 2.9–4.4)
Albumin/Glob SerPl: 1.3 (ref 0.7–1.7)
Alpha 1: 0.3 g/dL (ref 0.0–0.4)
Alpha2 Glob SerPl Elph-Mcnc: 0.9 g/dL (ref 0.4–1.0)
B-Globulin SerPl Elph-Mcnc: 1 g/dL (ref 0.7–1.3)
Gamma Glob SerPl Elph-Mcnc: 0.7 g/dL (ref 0.4–1.8)
Globulin, Total: 2.9 g/dL (ref 2.2–3.9)
IgA: 347 mg/dL (ref 64–422)
IgG (Immunoglobin G), Serum: 799 mg/dL (ref 586–1602)
IgM (Immunoglobulin M), Srm: 25 mg/dL — ABNORMAL LOW (ref 26–217)
Total Protein ELP: 6.5 g/dL (ref 6.0–8.5)

## 2019-08-12 ENCOUNTER — Ambulatory Visit: Payer: Medicare HMO | Admitting: Rheumatology

## 2019-09-01 ENCOUNTER — Ambulatory Visit (INDEPENDENT_AMBULATORY_CARE_PROVIDER_SITE_OTHER): Payer: Medicare HMO | Admitting: Family Medicine

## 2019-09-01 ENCOUNTER — Encounter: Payer: Self-pay | Admitting: Family Medicine

## 2019-09-01 ENCOUNTER — Other Ambulatory Visit: Payer: Self-pay

## 2019-09-01 VITALS — BP 112/70 | HR 75 | Temp 97.3°F | Wt 104.0 lb

## 2019-09-01 DIAGNOSIS — E1169 Type 2 diabetes mellitus with other specified complication: Secondary | ICD-10-CM | POA: Diagnosis not present

## 2019-09-01 DIAGNOSIS — I10 Essential (primary) hypertension: Secondary | ICD-10-CM | POA: Diagnosis not present

## 2019-09-01 DIAGNOSIS — E119 Type 2 diabetes mellitus without complications: Secondary | ICD-10-CM

## 2019-09-01 DIAGNOSIS — E785 Hyperlipidemia, unspecified: Secondary | ICD-10-CM | POA: Diagnosis not present

## 2019-09-01 DIAGNOSIS — E1159 Type 2 diabetes mellitus with other circulatory complications: Secondary | ICD-10-CM | POA: Diagnosis not present

## 2019-09-01 DIAGNOSIS — E039 Hypothyroidism, unspecified: Secondary | ICD-10-CM

## 2019-09-01 DIAGNOSIS — M0600 Rheumatoid arthritis without rheumatoid factor, unspecified site: Secondary | ICD-10-CM | POA: Diagnosis not present

## 2019-09-01 DIAGNOSIS — I152 Hypertension secondary to endocrine disorders: Secondary | ICD-10-CM

## 2019-09-01 LAB — POCT GLYCOSYLATED HEMOGLOBIN (HGB A1C): Hemoglobin A1C: 7.1 % — AB (ref 4.0–5.6)

## 2019-09-01 MED ORDER — TRAMADOL HCL 50 MG PO TABS: 50.0000 mg | ORAL_TABLET | Freq: Four times a day (QID) | ORAL | 0 refills | Status: DC | PRN

## 2019-09-01 NOTE — Addendum Note (Signed)
Addended by: Elyse Jarvis on: 09/01/2019 11:01 AM   Modules accepted: Orders

## 2019-09-01 NOTE — Progress Notes (Signed)
  Subjective:    Patient ID: Deborah Travis, female    DOB: Jun 08, 1941, 79 y.o.   MRN: [id number]  Deborah Travis is a 79 y.o. female who presents for follow-up of Type 2 diabetes mellitus. Home blood sugar records: no record.  She is in the process of getting a new machine and test strips. Current symptoms/problems include none at this time Daily foot checks:yes   Any foot concerns: none Exercise: walking one hour a day Diet:reg. She takes Metformin and is having no problems with that.  She is having difficulty with rheumatoid arthritis especially of the left wrist which has become swollen and painful over the last 6 weeks.  She has concerns over taking tramadol for this.  She has an appointment with rheumatology on January 4.  She continues on [place].  She is also taking multivitamins without difficulty.  Continues on Synthroid and does take it on an empty stomach.  Lisinopril is causing no trouble.  She has no aches or pains from pravastatin. The following portions of the patient's history were reviewed and updated as appropriate: allergies, current medications, past medical history, past social history and problem list.  ROS as in subjective above.     Objective:    Physical Exam Alert and in no distress.  Left wrist is swollen, tender to palpation and slightly warm..   Lab Review Diabetic Labs Latest Ref Rng & Units 08/03/2019 07/02/2019 04/29/2019 03/30/2019 02/02/2019  HbA1c 4.0 - 5.6 % - - 6.7(A) - -  Microalbumin mg/L - - - - -  Micro/Creat Ratio - - - - - -  Chol 100 - 199 mg/dL - - - - -  HDL >39 mg/dL - - - - -  Calc LDL 0 - 99 mg/dL - - - - -  Triglycerides 0 - 149 mg/dL - - - - -  Creatinine 0.44 - 1.00 mg/dL 1.00 1.02(H) - 1.06(H) 1.14(H)   BP/Weight 08/03/2019 04/29/2019 03/10/2019 02/02/2019 [id number]  Systolic BP 99991111 0000000 [id number] 96 [id number]  Diastolic BP 67 80 71 62 79  Wt. (Lbs) 103.8 105.2 101.4 100.8 -  BMI 18.53 18.78 18.11 17.86 -   Foot/eye exam completion dates Latest Ref Rng &  Units 12/29/2018 05/20/2018  Eye Exam No Retinopathy - No Retinopathy  Foot Form Completion - Done -  Hemoglobin A1c is 7.1 Deborah Travis  reports that she quit smoking about 3 years ago. She smoked 0.10 packs per day. She has never used smokeless tobacco. She reports that she does not drink alcohol or use drugs.     Assessment & Plan:    Type 2 diabetes mellitus without complication, without long-term current use of insulin (HCC)  Hyperlipidemia associated with type 2 diabetes mellitus ([place])  Hypothyroidism, unspecified type  Hypertension associated with diabetes ([place])  Seronegative rheumatoid arthritis ([place])   1. Rx changes: Tramadol 2. Education: Reviewed 'ABCs' of diabetes management (respective goals in parentheses):  A1C (<7), blood pressure (<130/80), and cholesterol (LDL <100). 3. Compliance at present is estimated to be good. Efforts to improve compliance (if necessary) will be directed at Continue with present diet and exercise regimen. 4. Follow up: 4 months Encouraged her to call her rheumatologist and not wait 1 more week.  Also encouraged her to take her tramadol.  Recommend she get an eye appointment.  Blood work will be done with her next visit.

## 2019-09-07 NOTE — Progress Notes (Signed)
Virtual Visit via Telephone Note  I connected with Deborah Travis on 09/08/19 at  8:00 AM EST by telephone and verified that I am speaking with the correct person using two identifiers.  Location: Patient: Home  Provider: Clinic  This service was conducted via virtual visit. The patient was located at home. I was located in my office.  Consent was obtained prior to the virtual visit and is aware of possible charges through their insurance for this visit.  The patient is an established patient.  Dr. Estanislado Pandy, MD conducted the virtual visit and Hazel Sams, PA-C acted as scribe during the service.  Office staff helped with scheduling follow up visits after the service was conducted.    I discussed the limitations, risks, security and privacy concerns of performing an evaluation and management service by telephone and the availability of in person appointments. I also discussed with the patient that there may be a patient responsible charge related to this service. The patient expressed understanding and agreed to proceed.  CC: left hand pain History of Present Illness: Patient is a 79 year old female with past medical history of seronegative rheumatoid arthritis and DDD. She is taking Arava 20 mg 1 tablet by mouth once daily.  She is currently experiencing pain and inflammation in the left hand and wrist joint, which started 6 weeks ago.  She is right hand dominant and has not been performing any overuse activities with her left hand.  She states the flare is progressively worsening. She has been experiencing nocturnal pain.  She denies any other joint pain or joint swelling.    Review of Systems  Constitutional: Negative for fever and malaise/fatigue.  Eyes: Negative for photophobia, pain, discharge and redness.  Respiratory: Negative for cough, shortness of breath and wheezing.   Cardiovascular: Negative for chest pain and palpitations.  Gastrointestinal: Negative for blood in stool,  constipation and diarrhea.  Genitourinary: Negative for dysuria.  Musculoskeletal: Positive for joint pain. Negative for back pain, myalgias and neck pain.       +Joint swelling  +Morning stiffness   Skin: Negative for rash.  Neurological: Negative for dizziness and headaches.  Psychiatric/Behavioral: Negative for depression. The patient is not nervous/anxious and does not have insomnia.       Observations/Objective: Physical Exam  Constitutional: She is oriented to person, place, and time.  Neurological: She is alert and oriented to person, place, and time.  Psychiatric: Mood, memory, affect and judgment normal.   Patient reports morning stiffness for 30 minutes.   Patient reports nocturnal pain.  Difficulty dressing/grooming: Denies Difficulty climbing stairs: Denies Difficulty getting out of chair: Denies Difficulty using hands for taps, buttons, cutlery, and/or writing: Reports   Assessment and Plan: Visit Diagnoses: Seronegative rheumatoid arthritis ([place]) - RF-,CCP-, 14-3-3 eta negative, Uric acid 4.8, ANA negative, no erosive changes on XR of hands: She is currently experiencing tenderness and inflammation in the left hand and left wrist joint, which started 6 weeks ago.  She denies any overuse activities, and she has not missed any doses of Arava.  She continues to take Arava 20 mg 1 tablet daily.  She has been experiencing nocturnal pain and increased morning stiffness in the left hand.  She is not having any other joint pain or joint swelling.  She declined a prednisone taper today.  She plans on continuing to take tramadol as prescribed by her PCP.  We discussed adding on plaquenil 200 mg 1 tablet by mouth daily.  Indications, contraindications,  and potential side effects of PLQ were discussed.  All questions were addressed.  She will come by the office for a handout of information and to sign the consent form.  Prescription pending consent. She will follow up in 1 month.    Patient was counseled on the purpose, proper use, and adverse effects of hydroxychloroquine including nausea/diarrhea, skin rash, headaches, and sun sensitivity.  Discussed importance of annual eye exams while on hydroxychloroquine to monitor to ocular toxicity and discussed importance of frequent laboratory monitoring.  Provided patient with eye exam form for baseline ophthalmologic exam.  Provided patient with educational materials on hydroxychloroquine and answered all questions.  Patient consented to hydroxychloroquine.  Will upload consent in the media tab.    Dose will be Plaquenil 200 mg once daily.  Prescription pending lab results.  High risk medication use - Arava 20 mg 1 tablet by mouth daily.  She will be adding on Plaquenil 200 mg 1 tablet daily.  CBC and CMP were drawn on 08/03/19.  She will return for lab work in 1 month then every 3 months. Standing orders are in place. She will require a baseline eye exam within 1 month of starting on PLQ.  DDD (degenerative disc disease), cervical -  Osteopenia of multiple sites - DEXA updated on 12/24/17: The BMD measured at Femur Total Right is 0.700 g/cm2 with a T-score of -2.4. Due to update DEXA in April 2021. She is taking a vitamin D supplement.   Vitamin D deficiency: She is taking a vitamin D supplement.   Other medical conditions are listed as follows:   History of chronic pancreatitis   History of gastroesophageal reflux (GERD)   Hypertension associated with diabetes ([place])  Senile purpura ([place])  History of hypothyroidism   Hyperlipidemia associated with type 2 diabetes mellitus ([place]) - Plan:   History of peptic ulcer disease   Former smoker  Follow Up Instructions: She will follow up in    I discussed the assessment and treatment plan with the patient. The patient was provided an opportunity to ask questions and all were answered. The patient agreed with the plan and demonstrated an understanding of the  instructions.   The patient was advised to call back or seek an in-person evaluation if the symptoms worsen or if the condition fails to improve as anticipated.  I provided 25 minutes of non-face-to-face time during this encounter.   [name], MD   Scribed by-  Hazel Sams, PA-C

## 2019-09-08 ENCOUNTER — Telehealth (INDEPENDENT_AMBULATORY_CARE_PROVIDER_SITE_OTHER): Payer: Medicare HMO | Admitting: Rheumatology

## 2019-09-08 ENCOUNTER — Other Ambulatory Visit: Payer: Self-pay

## 2019-09-08 ENCOUNTER — Encounter: Payer: Self-pay | Admitting: Rheumatology

## 2019-09-08 DIAGNOSIS — D692 Other nonthrombocytopenic purpura: Secondary | ICD-10-CM | POA: Diagnosis not present

## 2019-09-08 DIAGNOSIS — Z8719 Personal history of other diseases of the digestive system: Secondary | ICD-10-CM | POA: Diagnosis not present

## 2019-09-08 DIAGNOSIS — M5030 Other cervical disc degeneration, unspecified cervical region: Secondary | ICD-10-CM

## 2019-09-08 DIAGNOSIS — Z79899 Other long term (current) drug therapy: Secondary | ICD-10-CM

## 2019-09-08 DIAGNOSIS — E785 Hyperlipidemia, unspecified: Secondary | ICD-10-CM

## 2019-09-08 DIAGNOSIS — E1169 Type 2 diabetes mellitus with other specified complication: Secondary | ICD-10-CM

## 2019-09-08 DIAGNOSIS — M8589 Other specified disorders of bone density and structure, multiple sites: Secondary | ICD-10-CM | POA: Diagnosis not present

## 2019-09-08 DIAGNOSIS — E1159 Type 2 diabetes mellitus with other circulatory complications: Secondary | ICD-10-CM

## 2019-09-08 DIAGNOSIS — M0600 Rheumatoid arthritis without rheumatoid factor, unspecified site: Secondary | ICD-10-CM | POA: Diagnosis not present

## 2019-09-08 DIAGNOSIS — I152 Hypertension secondary to endocrine disorders: Secondary | ICD-10-CM

## 2019-09-08 DIAGNOSIS — I10 Essential (primary) hypertension: Secondary | ICD-10-CM

## 2019-09-08 DIAGNOSIS — E559 Vitamin D deficiency, unspecified: Secondary | ICD-10-CM

## 2019-09-08 DIAGNOSIS — Z8639 Personal history of other endocrine, nutritional and metabolic disease: Secondary | ICD-10-CM

## 2019-09-08 DIAGNOSIS — Z87891 Personal history of nicotine dependence: Secondary | ICD-10-CM

## 2019-09-08 DIAGNOSIS — Z8711 Personal history of peptic ulcer disease: Secondary | ICD-10-CM

## 2019-09-08 MED ORDER — HYDROXYCHLOROQUINE SULFATE 200 MG PO TABS: 200.0000 mg | ORAL_TABLET | Freq: Every day | ORAL | 0 refills | Status: DC

## 2019-09-08 NOTE — Patient Instructions (Addendum)
Standing Labs We placed an order today for your standing lab work.    Please come back and get your standing labs in 1 month and then every 3 months.   We have open lab daily Monday through Thursday from 8:30-12:30 PM and 1:30-4:30 PM and Friday from 8:30-12:30 PM and 1:30-4:00 PM at the office of Dr. [name].   You may experience shorter wait times on Monday and Friday afternoons. The office is located at [address] [place], [place], [place] [address] No appointment is necessary.   Labs are drawn by [facility].  You may receive a bill from [facility] for your lab work.  If you wish to have your labs drawn at another location, please call the office 24 hours in advance to send orders.  If you have any questions regarding directions or hours of operation,  please call [phone].   Just as a reminder please drink plenty of water prior to coming for your lab work. Thanks!   Hydroxychloroquine tablets What is this medicine? HYDROXYCHLOROQUINE (hye drox ee KLOR oh kwin) is used to treat rheumatoid arthritis and systemic lupus erythematosus. It is also used to treat malaria. This medicine may be used for other purposes; ask your health care provider or pharmacist if you have questions. COMMON BRAND NAME(S): Plaquenil, Quineprox What should I tell my health care provider before I take this medicine? They need to know if you have any of these conditions:  diabetes  eye disease, vision problems  G6PD deficiency  heart disease  history of irregular heartbeat  if you often drink alcohol  kidney disease  liver disease  porphyria  psoriasis  an unusual or allergic reaction to chloroquine, hydroxychloroquine, other medicines, foods, dyes, or preservatives  pregnant or trying to get pregnant  breast-feeding How should I use this medicine? Take this medicine by mouth with a glass of water. Follow the directions on the prescription label. Do not cut, crush or chew  this medicine. Swallow the tablets whole. Take this medicine with food. Avoid taking antacids within 4 hours of taking this medicine. It is best to separate these medicines by at least 4 hours. Take your medicine at regular intervals. Do not take it more often than directed. Take all of your medicine as directed even if you think you are better. Do not skip doses or stop your medicine early. Talk to your pediatrician regarding the use of this medicine in children. While this drug may be prescribed for selected conditions, precautions do apply. Overdosage: If you think you have taken too much of this medicine contact a poison control center or emergency room at once. NOTE: This medicine is only for you. Do not share this medicine with others. What if I miss a dose? If you miss a dose, take it as soon as you can. If it is almost time for your next dose, take only that dose. Do not take double or extra doses. What may interact with this medicine? Do not take this medicine with any of the following medications:  cisapride  dronedarone  pimozide  thioridazine This medicine may also interact with the following medications:  ampicillin  antacids  cimetidine  cyclosporine  digoxin  kaolin  medicines for diabetes, like insulin, glipizide, glyburide  medicines for seizures like carbamazepine, phenobarbital, phenytoin  mefloquine  methotrexate  other medicines that prolong the QT interval (cause an abnormal heart rhythm)  praziquantel This list may not describe all possible interactions. Give your health care provider a  list of all the medicines, herbs, non-prescription drugs, or dietary supplements you use. Also tell them if you smoke, drink alcohol, or use illegal drugs. Some items may interact with your medicine. What should I watch for while using this medicine? Visit your health care professional for regular checks on your progress. Tell your health care professional if your  symptoms do not start to get better or if they get worse. You may need blood work done while you are taking this medicine. If you take other medicines that can affect heart rhythm, you may need more testing. Talk to your health care professional if you have questions. Your vision may be tested before and during use of this medicine. Tell your health care professional right away if you have any change in your eyesight. What side effects may I notice from receiving this medicine? Side effects that you should report to your doctor or health care professional as soon as possible:  allergic reactions like skin rash, itching or hives, swelling of the face, lips, or tongue  changes in vision  decreased hearing or ringing of the ears  muscle weakness  redness, blistering, peeling or loosening of the skin, including inside the mouth  sensitivity to light  signs and symptoms of a dangerous change in heartbeat or heart rhythm like chest pain; dizziness; fast or irregular heartbeat; palpitations; feeling faint or lightheaded, falls; breathing problems  signs and symptoms of liver injury like dark yellow or brown urine; general ill feeling or flu-like symptoms; light-colored stools; loss of appetite; nausea; right upper belly pain; unusually weak or tired; yellowing of the eyes or skin  signs and symptoms of low blood sugar such as feeling anxious; confusion; dizziness; increased hunger; unusually weak or tired; sweating; shakiness; cold; irritable; headache; blurred vision; fast heartbeat; loss of consciousness  suicidal thoughts  uncontrollable head, mouth, neck, arm, or leg movements Side effects that usually do not require medical attention (report to your doctor or health care professional if they continue or are bothersome):  diarrhea  dizziness  hair loss  headache  irritable  loss of appetite  nausea, vomiting  stomach pain This list may not describe all possible side effects.  Call your doctor for medical advice about side effects. You may report side effects to FDA at 1-800-FDA-1088. Where should I keep my medicine? Keep out of the reach of children. Store at room temperature between 15 and 30 degrees C (59 and 86 degrees F). Protect from moisture and light. Throw away any unused medicine after the expiration date. NOTE: This sheet is a summary. It may not cover all possible information. If you have questions about this medicine, talk to your doctor, pharmacist, or health care provider.  2020 Elsevier/Gold Standard (2018-12-22 12:56:32)

## 2019-09-08 NOTE — Addendum Note (Signed)
Addended by: Earnestine Mealing on: 09/08/2019 10:26 AM   Modules accepted: Orders

## 2019-09-17 DIAGNOSIS — H401121 Primary open-angle glaucoma, left eye, mild stage: Secondary | ICD-10-CM | POA: Diagnosis not present

## 2019-09-17 DIAGNOSIS — Z79899 Other long term (current) drug therapy: Secondary | ICD-10-CM | POA: Diagnosis not present

## 2019-09-17 DIAGNOSIS — M069 Rheumatoid arthritis, unspecified: Secondary | ICD-10-CM | POA: Diagnosis not present

## 2019-09-17 DIAGNOSIS — H40021 Open angle with borderline findings, high risk, right eye: Secondary | ICD-10-CM | POA: Diagnosis not present

## 2019-09-17 LAB — HM DIABETES EYE EXAM

## 2019-09-21 ENCOUNTER — Telehealth: Payer: Self-pay | Admitting: Rheumatology

## 2019-09-21 DIAGNOSIS — M0600 Rheumatoid arthritis without rheumatoid factor, unspecified site: Secondary | ICD-10-CM

## 2019-09-21 MED ORDER — LEFLUNOMIDE 20 MG PO TABS: 20.0000 mg | ORAL_TABLET | Freq: Every day | ORAL | 0 refills | Status: DC

## 2019-09-21 NOTE — Telephone Encounter (Signed)
-----   Message from Shona Needles, RT sent at 09/08/2019  1:27 PM EST ----- Regarding: 1 MONTH F/U

## 2019-09-21 NOTE — Telephone Encounter (Signed)
Last Visit: 09/08/19 Next visit: 10/07/19 Labs: 08/03/19 CO2 18, Calcium 8.6, GFR 55  Okay to refill per Dr. Arvin Travis was sent in on 09/08/19. Patient advised.

## 2019-09-21 NOTE — Telephone Encounter (Signed)
Patient needs a refill on Leflutimide, and Plaquenil sent to [facility]. Patient had Eye Exam, and doctor was to fax over report this past Thursday. Please call to advise.

## 2019-09-21 NOTE — Telephone Encounter (Signed)
I LMOM for patient to call, and schedule a follow up appointment for 1 month (around 10/08/19).

## 2019-10-01 ENCOUNTER — Other Ambulatory Visit: Payer: Self-pay | Admitting: Rheumatology

## 2019-10-01 DIAGNOSIS — M0600 Rheumatoid arthritis without rheumatoid factor, unspecified site: Secondary | ICD-10-CM

## 2019-10-01 NOTE — Telephone Encounter (Signed)
Last Visit: 09/08/2019 Next Visit: 10/07/2019 Labs: 08/03/2019 Eye exam: 09/17/2019  patient is due to update labs 1 month after starting plaquenil. We will update at patient's appointment on 10/07/2019 and then refill plaquenil.

## 2019-10-05 NOTE — Progress Notes (Signed)
Office Visit Note  Patient: Deborah Travis             Date of Birth: February 20, 1941           MRN: [id number]             PCP: [name], MD Referring: [name], MD Visit Date: 10/07/2019 Occupation: @GUAROCC @  Subjective:  Medication management.   History of Present Illness: Deborah Travis is a 79 y.o. female with history of rheumatoid arthritis and osteoarthritis overlap.  She states she has noticed improvement in her joints since she added Plaquenil.  She ran out of Plaquenil now.  She has some discomfort in the left wrist joint off and on.  None of the other joints are painful.  Activities of Daily Living:  Patient reports morning stiffness for 0 minutes.   Patient Denies nocturnal pain.  Difficulty dressing/grooming: Reports Difficulty climbing stairs: Denies Difficulty getting out of chair: Reports Difficulty using hands for taps, buttons, cutlery, and/or writing: Reports  Review of Systems  Constitutional: Negative for fatigue, night sweats, weight gain and weight loss.  HENT: Negative for mouth sores, trouble swallowing, trouble swallowing, mouth dryness and nose dryness.   Eyes: Negative for pain, redness, itching, visual disturbance and dryness.  Respiratory: Negative for cough, shortness of breath and difficulty breathing.   Cardiovascular: Negative for chest pain, palpitations, hypertension, irregular heartbeat and swelling in legs/feet.  Gastrointestinal: Positive for diarrhea. Negative for blood in stool and constipation.  Endocrine: Negative for increased urination.  Genitourinary: Negative for difficulty urinating, painful urination and vaginal dryness.  Musculoskeletal: Positive for arthralgias, joint pain and joint swelling. Negative for myalgias, muscle weakness, morning stiffness, muscle tenderness and myalgias.  Skin: Negative for color change, rash, hair loss, skin tightness, ulcers and sensitivity to sunlight.  Allergic/Immunologic: Negative for  susceptible to infections.  Neurological: Negative for dizziness, numbness, headaches, memory loss, night sweats and weakness.  Hematological: Positive for bruising/bleeding tendency. Negative for swollen glands.  Psychiatric/Behavioral: Negative for depressed mood, confusion and sleep disturbance. The patient is not nervous/anxious.     PMFS History:  Patient Active Problem List   Diagnosis Date Noted  . History of pancreatitis 01/20/2019  . Atherosclerosis 08/08/2018  . Glaucoma 05/26/2018  . Hypertensive retinopathy 05/26/2018  . Seronegative rheumatoid arthritis ([place]) 03/04/2018  . Senile purpura ([place]) 02/01/2015  . Former smoker 10/12/2013  . History of peptic ulcer disease 07/13/2013  . Osteopenia 05/07/2013  . Hypothyroidism 01/12/2012  . Type 2 diabetes with complication ([place]) [id number]  . Hypertension associated with diabetes ([place]) 05/21/2011  . Exocrine pancreatic insufficiency 05/21/2011  . Hyperlipidemia associated with type 2 diabetes mellitus ([place]) 05/21/2011    Past Medical History:  Diagnosis Date  . Chronic kidney disease    RENAL STONE  . Chronic pancreatitis ([place])   . Diabetes mellitus   . Duodenal ulcer   . Dyslipidemia   . GERD (gastroesophageal reflux disease)   . Hypertension   . Osteopenia   . Thyroid disease    HYPOTHYROID  . Vitamin D deficiency     Family History  Problem Relation Age of Onset  . Stroke Mother   . Heart disease Father   . Healthy Daughter   . Healthy Daughter    Past Surgical History:  Procedure Laterality Date  . APPENDECTOMY    . CATARACT EXTRACTION Right 2014  . CHOLECYSTECTOMY    . TONSILECTOMY, ADENOIDECTOMY, BILATERAL MYRINGOTOMY AND TUBES    . WHIPPLE PROCEDURE  1998   Social History   Social History Narrative  . Not on file   Immunization History  Administered Date(s) Administered  . Fluad Quad(high Dose 65+) 06/01/2019  . Influenza Split 05/21/2011, 06/12/2012, 07/15/2013  . Influenza, High Dose  Seasonal PF 07/13/2013, 07/01/2014, 06/01/2015, 06/15/2016, 04/15/2017, 05/19/2018  . Pneumococcal Conjugate-13 06/15/2016  . Pneumococcal Polysaccharide-23 06/12/2012  . Tdap 06/28/2011  . Zoster 06/27/2013     Objective: Vital Signs: BP 99/63 (BP Location: Left Arm, Patient Position: Sitting, Cuff Size: Normal)   Pulse 60   Resp 11   Ht 5' 2.75" (1.594 m)   Wt 103 lb 12.8 oz (47.1 kg)   BMI 18.53 kg/m    Physical Exam Vitals and nursing note reviewed.  Constitutional:      Appearance: She is well-developed.  HENT:     Head: Normocephalic and atraumatic.  Eyes:     Conjunctiva/sclera: Conjunctivae normal.  Cardiovascular:     Rate and Rhythm: Normal rate and regular rhythm.     Heart sounds: Normal heart sounds.  Pulmonary:     Effort: Pulmonary effort is normal.     Breath sounds: Normal breath sounds.  Abdominal:     General: Bowel sounds are normal.     Palpations: Abdomen is soft.  Musculoskeletal:     Cervical back: Normal range of motion.  Lymphadenopathy:     Cervical: No cervical adenopathy.  Skin:    General: Skin is warm and dry.     Capillary Refill: Capillary refill takes less than 2 seconds.  Neurological:     Mental Status: She is alert and oriented to person, place, and time.  Psychiatric:        Behavior: Behavior normal.      Musculoskeletal Exam: C-spine was in good range of motion.  Shoulder joints, elbow joints were in good range of motion.  She has limited range of motion of her left wrist joint with significant swelling.  No MCP PIP or DIP swelling was noted.  Hip joints, knee joints, ankles were in good range of motion with no synovitis.  CDAI Exam: CDAI Score: 2.7  Patient Global: 3 mm; Provider Global: 4 mm Swollen: 1 ; Tender: 1  Joint Exam 10/07/2019      Right  Left  Wrist     Swollen Tender     Investigation: No additional findings.  Imaging: No results found.  Recent Labs: Lab Results  Component Value Date   WBC 8.2  10/06/2019   HGB 11.9 10/06/2019   PLT 349 10/06/2019   NA 132 (L) 10/06/2019   K 4.9 10/06/2019   CL 96 (L) 10/06/2019   CO2 26 10/06/2019   GLUCOSE 200 (H) 10/06/2019   BUN 16 10/06/2019   CREATININE 1.13 (H) 10/06/2019   BILITOT 0.3 10/06/2019   ALKPHOS 122 08/03/2019   AST 16 10/06/2019   ALT 10 10/06/2019   PROT 6.1 10/06/2019   ALBUMIN 3.8 08/03/2019   CALCIUM 9.2 10/06/2019   GFRAA 55 (L) 10/06/2019   QFTBGOLDPLUS NEGATIVE 03/30/2019    Speciality Comments: PLQ Eye Exam: 09/17/19 WNL @ [facility] Follow up in 6 months   Procedures:  No procedures performed Allergies: Levofloxacin and Codeine   Assessment / Plan:     Visit Diagnoses: Seronegative rheumatoid arthritis ([place]) - RF-,CCP-, 14-3-3 eta negative, Uric acid 4.8, ANA negative, no erosive changes on XR of hands -patient states she has noticed improvement in her symptoms since she started taking hydroxychloroquine.  She  requested a refill on hydroxychloroquine.  On examination she has significant swelling in her left wrist joint.  I offered cortisone injection which she declined.  She was to the Plaquenil more time.  Plan: hydroxychloroquine (PLAQUENIL) 200 MG tablet  High risk medication use - Arava 20 mg 1 tablet by mouth daily, added Plaquenil at the last visit 200 mg p.o. daily  DDD (degenerative disc disease), cervical-she had good range of motion of her cervical spine.  Osteopenia of multiple sites-she is taking calcium and vitamin D.  Vitamin D deficiency  Other medical problems are listed as follows:  History of chronic pancreatitis  History of gastroesophageal reflux (GERD)  Hypertension associated with diabetes ([place])  Senile purpura ([place])  History of hypothyroidism  Hyperlipidemia associated with type 2 diabetes mellitus ([place])  History of peptic ulcer disease  Former smoker  Seronegative rheumatoid arthritis ([place]) - Plan: hydroxychloroquine (PLAQUENIL) 200 MG tablet  Orders: No  orders of the defined types were placed in this encounter.  Meds ordered this encounter  Medications  . hydroxychloroquine (PLAQUENIL) 200 MG tablet    Sig: Take 1 tablet (200 mg total) by mouth daily.    Dispense:  30 tablet    Refill:  0     Follow-Up Instructions: Return in about 3 months (around 01/04/2020) for Rheumatoid arthritis, Osteoarthritis.   [name], MD  Note - This record has been created using Editor, commissioning.  Chart creation errors have been sought, but may not always  have been located. Such creation errors do not reflect on  the standard of medical care.

## 2019-10-06 ENCOUNTER — Other Ambulatory Visit: Payer: Self-pay | Admitting: *Deleted

## 2019-10-06 DIAGNOSIS — Z79899 Other long term (current) drug therapy: Secondary | ICD-10-CM

## 2019-10-06 LAB — CBC WITH DIFFERENTIAL/PLATELET
Absolute Monocytes: 705 cells/uL (ref 200–950)
Basophils Absolute: 41 cells/uL (ref 0–200)
Basophils Relative: 0.5 %
Eosinophils Absolute: 107 cells/uL (ref 15–500)
Eosinophils Relative: 1.3 %
HCT: 36.2 % (ref 35.0–45.0)
Hemoglobin: 11.9 g/dL (ref 11.7–15.5)
Lymphs Abs: 2214 cells/uL (ref 850–3900)
MCH: 30.8 pg (ref 27.0–33.0)
MCHC: 32.9 g/dL (ref 32.0–36.0)
MCV: 93.8 fL (ref 80.0–100.0)
MPV: 9.7 fL (ref 7.5–12.5)
Monocytes Relative: 8.6 %
Neutro Abs: 5133 cells/uL (ref 1500–7800)
Neutrophils Relative %: 62.6 %
Platelets: 349 10*3/uL (ref 140–400)
RBC: 3.86 10*6/uL (ref 3.80–5.10)
RDW: 12.3 % (ref 11.0–15.0)
Total Lymphocyte: 27 %
WBC: 8.2 10*3/uL (ref 3.8–10.8)

## 2019-10-06 LAB — COMPLETE METABOLIC PANEL WITH GFR
AG Ratio: 1.3 (calc) (ref 1.0–2.5)
ALT: 10 U/L (ref 6–29)
AST: 16 U/L (ref 10–35)
Albumin: 3.4 g/dL — ABNORMAL LOW (ref 3.6–5.1)
Alkaline phosphatase (APISO): 118 U/L (ref 37–153)
BUN/Creatinine Ratio: 14 (calc) (ref 6–22)
BUN: 16 mg/dL (ref 7–25)
CO2: 26 mmol/L (ref 20–32)
Calcium: 9.2 mg/dL (ref 8.6–10.4)
Chloride: 96 mmol/L — ABNORMAL LOW (ref 98–110)
Creat: 1.13 mg/dL — ABNORMAL HIGH (ref 0.60–0.93)
GFR, Est African American: 55 mL/min/{1.73_m2} — ABNORMAL LOW (ref >=60)
GFR, Est Non African American: 47 mL/min/{1.73_m2} — ABNORMAL LOW (ref >=60)
Globulin: 2.7 g/dL (calc) (ref 1.9–3.7)
Glucose, Bld: 200 mg/dL — ABNORMAL HIGH (ref 65–99)
Potassium: 4.9 mmol/L (ref 3.5–5.3)
Sodium: 132 mmol/L — ABNORMAL LOW (ref 135–146)
Total Bilirubin: 0.3 mg/dL (ref 0.2–1.2)
Total Protein: 6.1 g/dL (ref 6.1–8.1)

## 2019-10-07 ENCOUNTER — Ambulatory Visit: Payer: Medicare HMO | Admitting: Rheumatology

## 2019-10-07 ENCOUNTER — Encounter: Payer: Self-pay | Admitting: Rheumatology

## 2019-10-07 ENCOUNTER — Other Ambulatory Visit: Payer: Self-pay

## 2019-10-07 VITALS — BP 99/63 | HR 60 | Resp 11 | Ht 62.75 in | Wt 103.8 lb

## 2019-10-07 DIAGNOSIS — I152 Hypertension secondary to endocrine disorders: Secondary | ICD-10-CM

## 2019-10-07 DIAGNOSIS — I10 Essential (primary) hypertension: Secondary | ICD-10-CM

## 2019-10-07 DIAGNOSIS — E559 Vitamin D deficiency, unspecified: Secondary | ICD-10-CM

## 2019-10-07 DIAGNOSIS — Z8719 Personal history of other diseases of the digestive system: Secondary | ICD-10-CM

## 2019-10-07 DIAGNOSIS — Z8711 Personal history of peptic ulcer disease: Secondary | ICD-10-CM

## 2019-10-07 DIAGNOSIS — Z8639 Personal history of other endocrine, nutritional and metabolic disease: Secondary | ICD-10-CM | POA: Diagnosis not present

## 2019-10-07 DIAGNOSIS — M8589 Other specified disorders of bone density and structure, multiple sites: Secondary | ICD-10-CM | POA: Diagnosis not present

## 2019-10-07 DIAGNOSIS — E1159 Type 2 diabetes mellitus with other circulatory complications: Secondary | ICD-10-CM | POA: Diagnosis not present

## 2019-10-07 DIAGNOSIS — M5030 Other cervical disc degeneration, unspecified cervical region: Secondary | ICD-10-CM | POA: Diagnosis not present

## 2019-10-07 DIAGNOSIS — E1169 Type 2 diabetes mellitus with other specified complication: Secondary | ICD-10-CM

## 2019-10-07 DIAGNOSIS — M0600 Rheumatoid arthritis without rheumatoid factor, unspecified site: Secondary | ICD-10-CM | POA: Diagnosis not present

## 2019-10-07 DIAGNOSIS — Z79899 Other long term (current) drug therapy: Secondary | ICD-10-CM

## 2019-10-07 DIAGNOSIS — Z87891 Personal history of nicotine dependence: Secondary | ICD-10-CM

## 2019-10-07 DIAGNOSIS — E785 Hyperlipidemia, unspecified: Secondary | ICD-10-CM

## 2019-10-07 DIAGNOSIS — D692 Other nonthrombocytopenic purpura: Secondary | ICD-10-CM

## 2019-10-07 MED ORDER — HYDROXYCHLOROQUINE SULFATE 200 MG PO TABS: 200.0000 mg | ORAL_TABLET | Freq: Every day | ORAL | 0 refills | Status: DC

## 2019-10-07 NOTE — Progress Notes (Signed)
CBC is normal.  Glucose is elevated.  GFR is low.  Please forward labs to her PCP and to her nephrologist .

## 2019-10-07 NOTE — Addendum Note (Signed)
Addended by: Earnestine Mealing on: 10/07/2019 04:30 PM   Modules accepted: Orders

## 2019-10-08 NOTE — Progress Notes (Signed)
Please note that patient's GFR has declined.

## 2019-10-20 IMAGING — CT CT ABDOMEN AND PELVIS WITH CONTRAST
2 of 5 series · 16 of 46 positions shown, 18 images · IV contrast (omnipaque)
Comparison: 08/07/2018

CLINICAL DATA: Acute generalized abdominal pain

EXAM:
CT ABDOMEN AND PELVIS WITH CONTRAST
TECHNIQUE: Multidetector CT imaging of the abdomen and pelvis was performed
using the standard protocol following bolus administration of
intravenous contrast.
CONTRAST:  80mL OMNIPAQUE IOHEXOL 300 MG/ML  SOLN

[Series 2: axial st · axial · 0.67mm/px · z∈[+1028,+1368]mm · 13 of 78 slices shown, 15 images]
[im 5/78  soft-tissue]
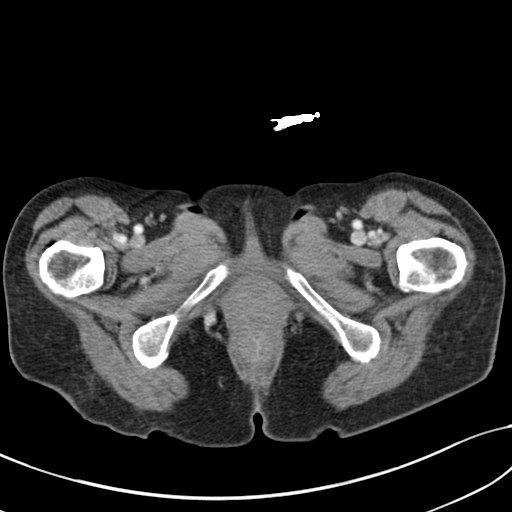
[im 5/78  bone]
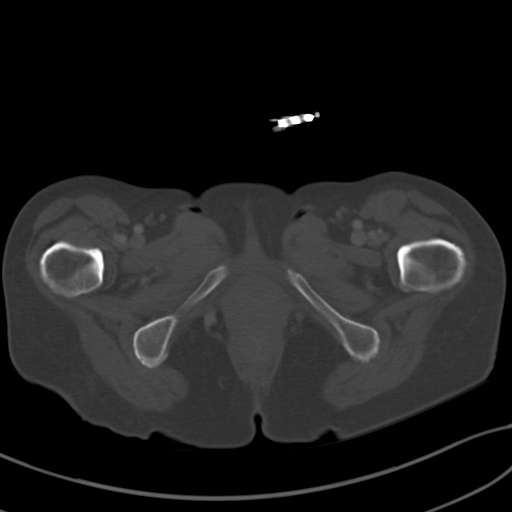
[im 10/78  soft-tissue]
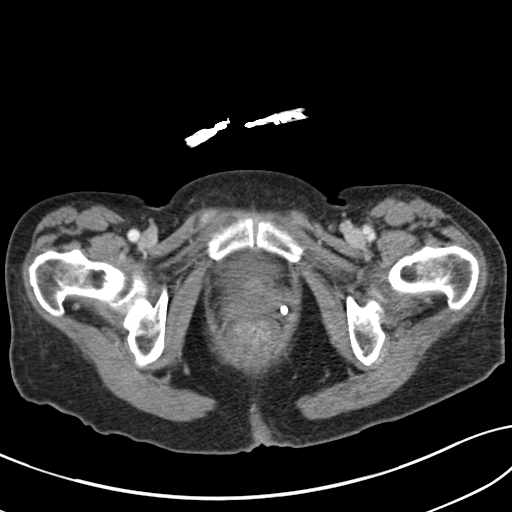
[im 19/78  soft-tissue]
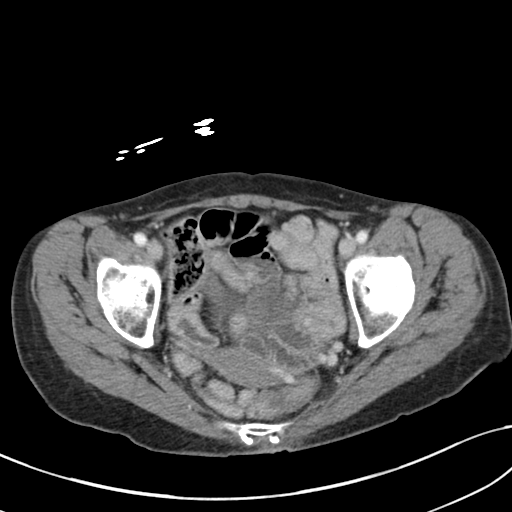
[im 23/78  soft-tissue]
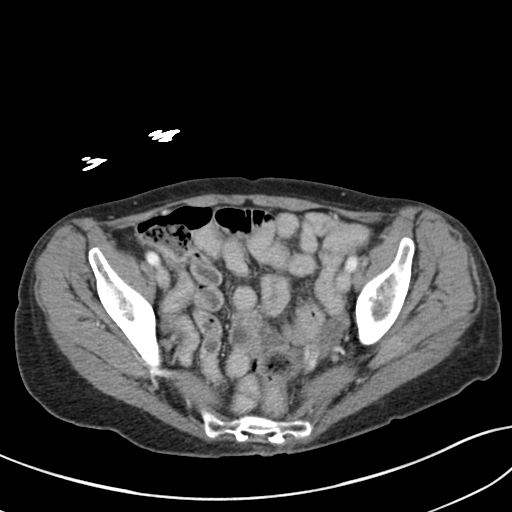
[im 28/78  soft-tissue]
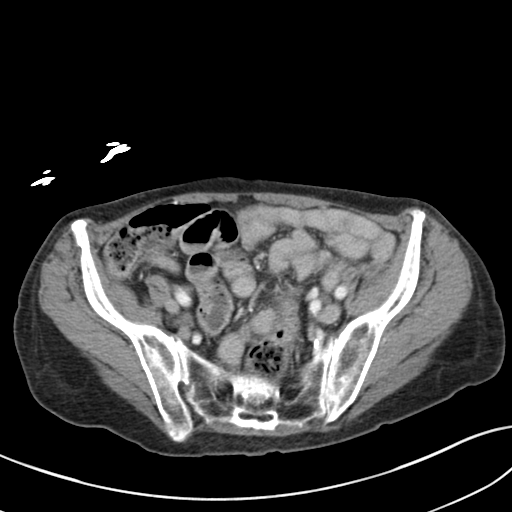
[im 32/78  soft-tissue]
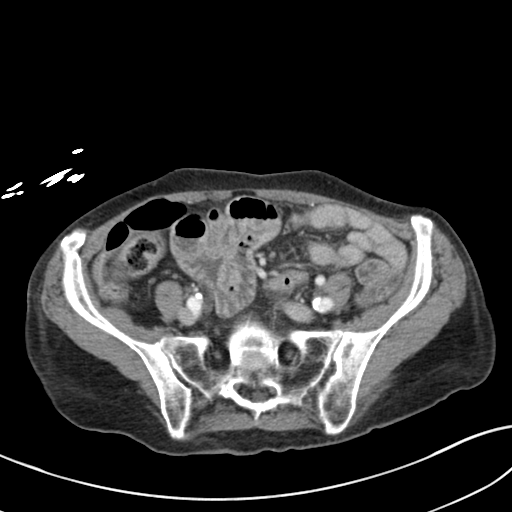
[im 41/78  soft-tissue]
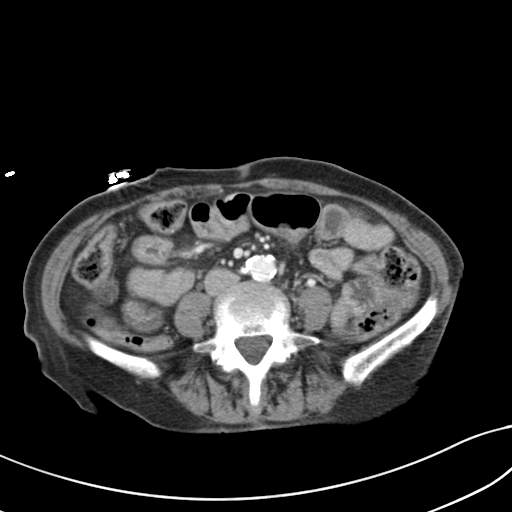
[im 46/78  soft-tissue]
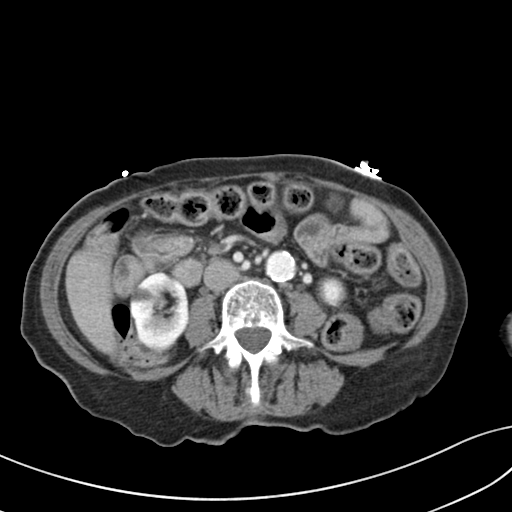
[im 50/78  soft-tissue]
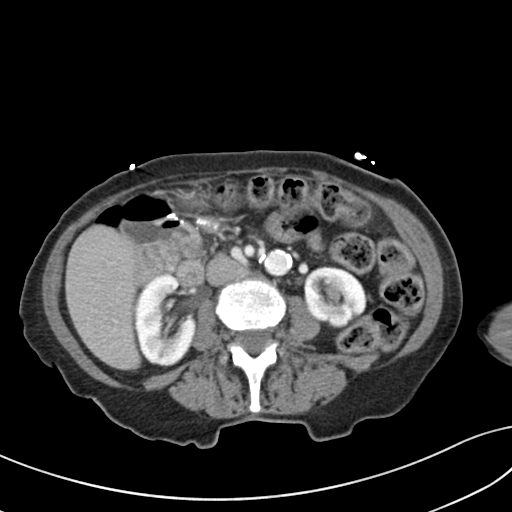
[im 50/78  bone]
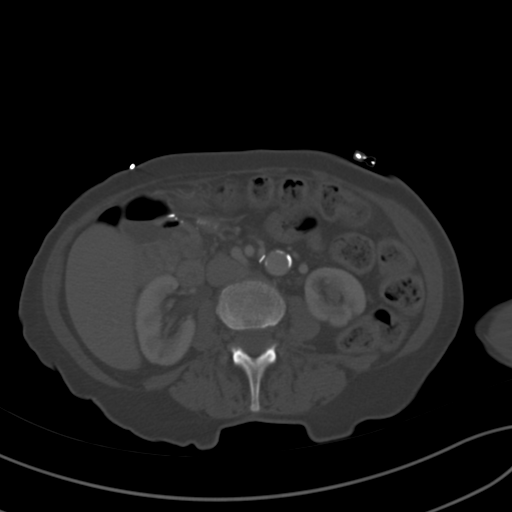
[im 55/78  soft-tissue]
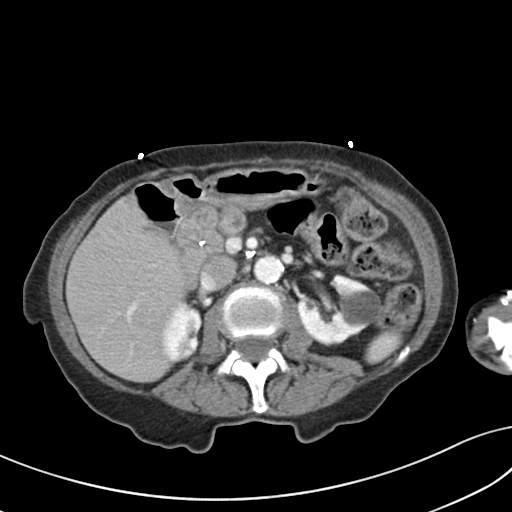
[im 59/78  soft-tissue]
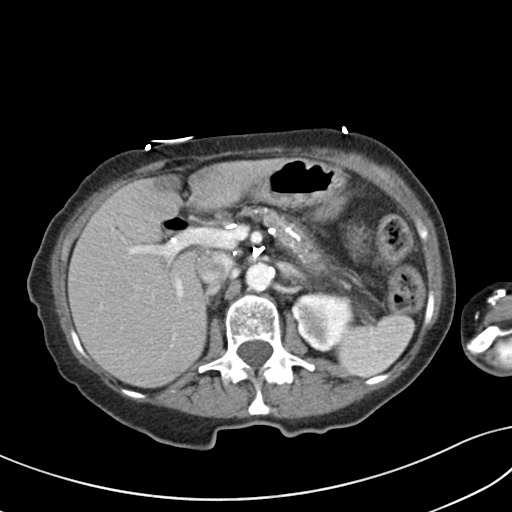
[im 68/78  soft-tissue]
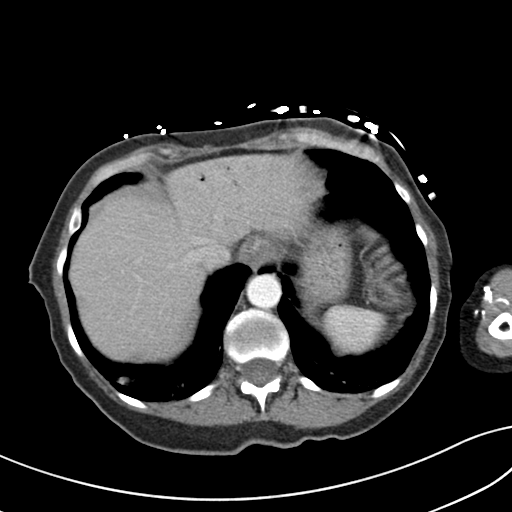
[im 73/78  soft-tissue]
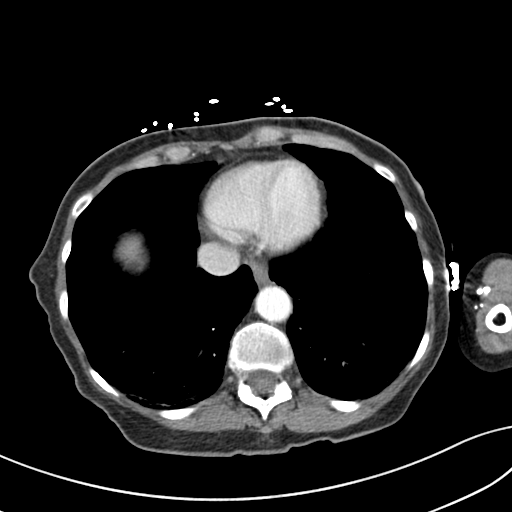

[Series 5: coronal st · coronal · 0.62mm/px · 3 of 107 slices shown]
[im 36/107  soft-tissue]
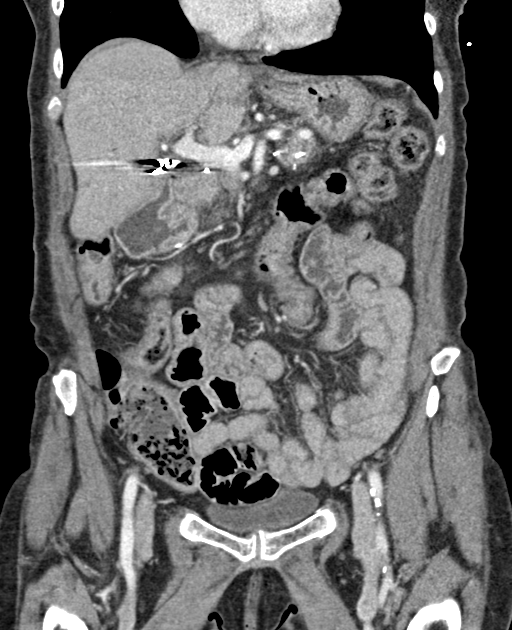
[im 48/107  soft-tissue]
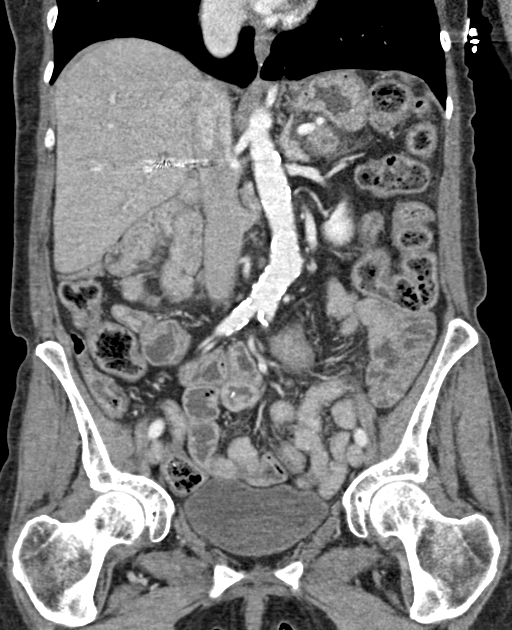
[im 59/107  soft-tissue]
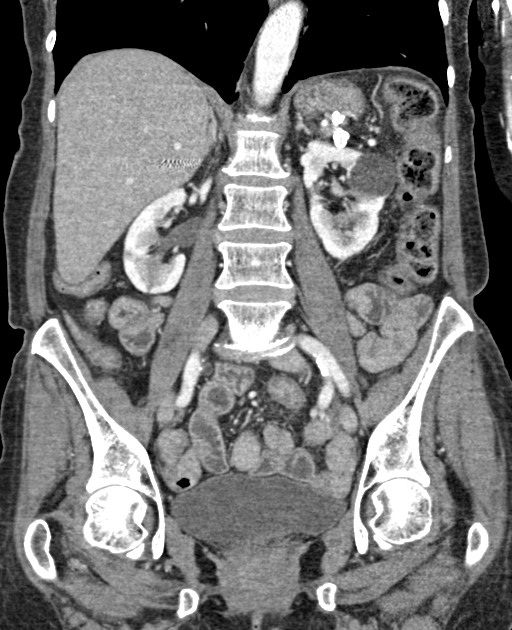

[16 of 46 positions shown; findings below may reference images not displayed]

FINDINGS: Lower chest:  No contributory findings.

Hepatobiliary: Fat deposition about the falciform ligament. Small
subcapsular right hepatic cyst.Pneumobilia after Whipple procedure.

Pancreas: Whipple procedure with chronic pancreatitis showing
atrophy and coarse calcifications. The main duct is chronically
dilated and there is ductal calculi with more proximal parenchymal
edema and peripancreatic fat stranding. No abscess or collection.

Spleen: Unremarkable.

Adrenals/Urinary Tract: Negative adrenals. Renal cysts and scarring.
Unremarkable bladder.

Stomach/Bowel:  No obstruction. No appendicitis.

Vascular/Lymphatic: No acute vascular abnormality. Atherosclerosis.
No mass or adenopathy.

Reproductive:No pathologic findings.

Other: No ascites or pneumoperitoneum.

Musculoskeletal: No acute abnormalities. L5-S1 advanced disc
degeneration.
IMPRESSION: Acute pancreatitis proximal to pancreatic duct calculi. There is a
background of chronic pancreatitis after Whipple procedure.

## 2019-10-27 ENCOUNTER — Telehealth: Payer: Self-pay

## 2019-10-27 MED ORDER — TRAMADOL HCL 50 MG PO TABS: 50.0000 mg | ORAL_TABLET | Freq: Four times a day (QID) | ORAL | 0 refills | Status: DC | PRN

## 2019-10-27 NOTE — Telephone Encounter (Signed)
Pt. Called LM stating that she needs a refill on her Tramadol to the [facility] on [address] pt. Last apt. 09/01/19.

## 2019-10-29 DIAGNOSIS — K861 Other chronic pancreatitis: Secondary | ICD-10-CM | POA: Diagnosis not present

## 2019-10-31 ENCOUNTER — Other Ambulatory Visit: Payer: Self-pay | Admitting: Rheumatology

## 2019-10-31 DIAGNOSIS — M0600 Rheumatoid arthritis without rheumatoid factor, unspecified site: Secondary | ICD-10-CM

## 2019-11-02 NOTE — Telephone Encounter (Signed)
Last Visit: 10/07/19 Next Visit: 01/07/20 Labs: 10/06/19 CBC is normal. Glucose is elevated. GFR is low.  Eye exam:  09/17/19 WNL  Okay to refill per Dr. Estanislado Pandy

## 2019-11-13 ENCOUNTER — Other Ambulatory Visit: Payer: Self-pay

## 2019-11-13 ENCOUNTER — Telehealth: Payer: Self-pay

## 2019-11-13 MED ORDER — ACCU-CHEK FASTCLIX LANCETS MISC: 1.0000 | Freq: Two times a day (BID) | 3 refills | Status: DC

## 2019-11-13 NOTE — Telephone Encounter (Signed)
Done KH 

## 2019-11-13 NOTE — Telephone Encounter (Signed)
Received fax from [facility] for a refill on the pts. Accu Chek Softclix Lancets pt. Last apt. Was 09/01/19.

## 2019-11-17 ENCOUNTER — Other Ambulatory Visit: Payer: Self-pay | Admitting: Rheumatology

## 2019-11-17 DIAGNOSIS — M0600 Rheumatoid arthritis without rheumatoid factor, unspecified site: Secondary | ICD-10-CM

## 2019-11-21 ENCOUNTER — Other Ambulatory Visit: Payer: Self-pay | Admitting: Family Medicine

## 2019-11-21 DIAGNOSIS — E119 Type 2 diabetes mellitus without complications: Secondary | ICD-10-CM

## 2019-11-25 ENCOUNTER — Other Ambulatory Visit: Payer: Self-pay | Admitting: Family Medicine

## 2019-11-25 ENCOUNTER — Other Ambulatory Visit: Payer: Self-pay | Admitting: Rheumatology

## 2019-11-25 DIAGNOSIS — E039 Hypothyroidism, unspecified: Secondary | ICD-10-CM

## 2019-11-25 DIAGNOSIS — E1159 Type 2 diabetes mellitus with other circulatory complications: Secondary | ICD-10-CM

## 2019-11-25 DIAGNOSIS — E1169 Type 2 diabetes mellitus with other specified complication: Secondary | ICD-10-CM

## 2019-11-25 DIAGNOSIS — E785 Hyperlipidemia, unspecified: Secondary | ICD-10-CM

## 2019-11-25 DIAGNOSIS — I152 Hypertension secondary to endocrine disorders: Secondary | ICD-10-CM

## 2019-11-25 DIAGNOSIS — M0600 Rheumatoid arthritis without rheumatoid factor, unspecified site: Secondary | ICD-10-CM

## 2019-11-25 NOTE — Telephone Encounter (Signed)
Last Visit: 10/07/19 Next Visit: 01/07/20 Labs: 10/06/19 CBC is normal. Glucose is elevated. GFR is low.  Eye exam:  09/17/19 WNL  Okay to refill per Dr. Estanislado Pandy

## 2019-12-04 ENCOUNTER — Telehealth: Payer: Self-pay | Admitting: Family Medicine

## 2019-12-04 MED ORDER — TRAMADOL HCL 50 MG PO TABS: 50.0000 mg | ORAL_TABLET | Freq: Four times a day (QID) | ORAL | 0 refills | Status: DC | PRN

## 2019-12-04 NOTE — Telephone Encounter (Signed)
Pt left VM and wanted to see if she could get a refill on Tramadol [facility] to [facility] on [address]

## 2019-12-09 ENCOUNTER — Ambulatory Visit: Payer: Medicare HMO | Admitting: Family Medicine

## 2019-12-09 ENCOUNTER — Encounter: Payer: Self-pay | Admitting: Family Medicine

## 2019-12-09 ENCOUNTER — Ambulatory Visit
Admission: RE | Admit: 2019-12-09 | Discharge: 2019-12-09 | Disposition: A | Discharge status: Home / Self Care | Payer: Medicare HMO | Source: Ambulatory Visit | Attending: Family Medicine | Admitting: Family Medicine

## 2019-12-09 ENCOUNTER — Other Ambulatory Visit: Payer: Self-pay

## 2019-12-09 VITALS — Temp 97.7°F | Ht 62.75 in | Wt 103.0 lb

## 2019-12-09 DIAGNOSIS — R0602 Shortness of breath: Secondary | ICD-10-CM

## 2019-12-09 DIAGNOSIS — R05 Cough: Secondary | ICD-10-CM | POA: Diagnosis not present

## 2019-12-09 DIAGNOSIS — R058 Other specified cough: Secondary | ICD-10-CM

## 2019-12-09 DIAGNOSIS — R509 Fever, unspecified: Secondary | ICD-10-CM

## 2019-12-09 DIAGNOSIS — J989 Respiratory disorder, unspecified: Secondary | ICD-10-CM

## 2019-12-09 MED ORDER — DOXYCYCLINE HYCLATE 100 MG PO TABS: 100.0000 mg | ORAL_TABLET | Freq: Two times a day (BID) | ORAL | 0 refills | Status: DC

## 2019-12-09 NOTE — Progress Notes (Signed)
Start time: 11:12 End time: 11:32  Virtual Visit via Video Note   I connected with Deborah Travis on 12/09/19 by a video enabled telemedicine application and verified that I am speaking with the correct person using two identifiers.  Location: Patient: home Provider: office   I discussed the limitations of evaluation and management by telemedicine and the availability of in person appointments. The patient expressed understanding and agreed to proceed.  History of Present Illness:  Chief Complaint  Patient presents with  . Cough    VIRTUAL coughing, chest congestion, SOB. Thinks she has a head cold. Mucus is clear from her sinuses and green when she coughs up. Symptoms startes 7-10 days ago.    10 days ago she started with congestion.  Started as a head cold and moved into her chest.  She is up at night coughing, getting up thick green stuff.  It is hard to take a deep breath, makes her cough, causes a spell because it is very hard to get the phlegm up.  She is getting short of breath with activity, over the last 3-4 days.  Nasal mucus is white.  Phlegm she coughs up is thick and green.  She had low grade fever (99.9) last week, and feels like it periodically comes back, has had some chills. She lost her smell and taste in 06/2018 when she had pneumonia, never got it back normally.  She has had both of her COVID vaccines (not documented in chart; recalls last dose was end of February or early March.)  No known sick contacts.  She is using Ric[place]a sugar-free cough drops, no other OTC meds.    PMH, PSH, SH reviewed. Pt with DM, RA. Last A1c 7.1 in 08/2019.  Outpatient Encounter Medications as of 12/09/2019  Medication Sig Note  . Accu-Chek FastClix Lancets MISC 1 Device by Does not apply route 2 (two) times daily.   . Alcohol Swabs (B-D SINGLE USE SWABS BUTTERFLY) PADS 1 each by Does not apply route 2 (two) times daily.   Marland Kitchen b complex vitamins capsule Take 1 capsule by mouth daily.    . Blood Glucose Calibration (ACCU-CHEK AVIVA) SOLN Use as directed   . Blood Glucose Monitoring Suppl (ACCU-CHEK AVIVA PLUS) w/Device KIT Use as directed Patient is to test BID DX E11.9   . cholecalciferol (VITAMIN D3) 25 MCG (1000 UT) tablet Take 1,000 Units by mouth 2 (two) times a day.   . Coenzyme Q10 (CO Q 10 PO) Take 1 tablet by mouth daily.    Marland Kitchen glucose blood test strip THIS IS FOR Accucheck Aviva Plus Use as instructed PATIENT IS TO TEST ONE TIME A DAY DX: E11.9   . hydroxychloroquine (PLAQUENIL) 200 MG tablet Take 1 tablet (200 mg total) by mouth daily.   Marland Kitchen leflunomide (ARAVA) 20 MG tablet Take 1 tablet (20 mg total) by mouth daily.   Marland Kitchen levothyroxine (SYNTHROID) 100 MCG tablet TAKE 1 TABLET EVERY DAY   . lisinopril (ZESTRIL) 10 MG tablet TAKE 1 TABLET EVERY DAY   . Menthol (RICOLA) LOZG Use as directed 1 each in the mouth or throat as needed. 12/09/2019: Sugar free  . metFORMIN (GLUCOPHAGE) 1000 MG tablet TAKE 1 TABLET TWICE DAILY   . Multiple Vitamin (MULITIVITAMIN WITH MINERALS) TABS Take 1 tablet by mouth daily.   . pravastatin (PRAVACHOL) 20 MG tablet TAKE 1 TABLET EVERY DAY   . traMADol (ULTRAM) 50 MG tablet Take 1 tablet (50 mg total) by mouth every 6 (six)  hours as needed.   Marland Kitchen VITAMIN E COMPLEX PO Take 1 capsule by mouth daily.    Marland Kitchen ZENPEP 25000-79000 units CPEP Take 1 tablet by mouth in the morning, at noon, and at bedtime.    . [DISCONTINUED] OVER THE COUNTER MEDICATION daily.   Marland Kitchen VITAMIN A PO Take 1 tablet by mouth daily.    . Zinc Sulfate (ZINC 15 PO) Take 15 mg by mouth daily.   . [DISCONTINUED] hydroxychloroquine (PLAQUENIL) 200 MG tablet TAKE 1 TABLET BY MOUTH EVERY DAY. DX:M06.00    No facility-administered encounter medications on file as of 12/09/2019.   Allergies  Allergen Reactions  . Levofloxacin     Muscle spasms and tightness   . Codeine Itching   ROS: some low grade fever and chills per HPI.  +cough, shortness of breath per HPI. No nausea, vomiting,  diarrhea, rash, or other complaints. See HPI   Observations/Objective:  Temp 97.7 F (36.5 C) (Oral)   Ht 5' 2.75" (1.594 m)   Wt 103 lb (46.7 kg)   BMI 18.39 kg/m  T98.9 when she rechecked temp with a different thermometer She is alert, oriented. She is in no distress, speaking easily in full sentences.  There is no coughing, sniffling or throat-clearing during her visit. Cranial nerves are grossly intact. Exam is limited due to virtual nature of the visit.   Assessment and Plan:  Productive cough - DDx reviewed--including pna, bronchitis, sinusitis, viral. Given DM, check CXR and cover with ABX (depending on CXR result) - Plan: DG Chest 2 View, doxycycline (VIBRA-TABS) 100 MG tablet  Shortness of breath - Plan: DG Chest 2 View  Febrile respiratory illness - Ddx reviewed with pt. Low risk for COVID given minimal exposure and she is vaccinated, but encouraged her to get tested ([facility])   COVID test--can get at [facility] before 3:30 today (by appt), or get at pharmacy.  Patient was advised to drink lots of water. Mucinex DM (I recommend getting the 12 hour version, and taking it twice daily). Instructed to go to [facility] for chest x-ray  CXR-- FINDINGS: Lungs are adequately inflated and otherwise clear. Cardiomediastinal silhouette and remainder of the exam is unchanged. IMPRESSION: No active cardiopulmonary disease.  After results back, doxycycline sent to pharmacy. Brief VM left stating antibiotic sent in. Will have nurse contact pt tomorrow to review results and ensure she got medication, and schedule f/u next week if not completely better.   Follow Up Instructions:    I discussed the assessment and treatment plan with the patient. The patient was provided an opportunity to ask questions and all were answered. The patient agreed with the plan and demonstrated an understanding of the instructions.   The patient was advised to call back or seek  an in-person evaluation if the symptoms worsen or if the condition fails to improve as anticipated.  I provided 30 minutes of video face-to-face time during this encounter, plus additional time spent in chart review and documentation.   Vikki Ports, MD

## 2019-12-10 ENCOUNTER — Telehealth: Payer: Self-pay | Admitting: Family Medicine

## 2019-12-10 DIAGNOSIS — Z03818 Encounter for observation for suspected exposure to other biological agents ruled out: Secondary | ICD-10-CM | POA: Diagnosis not present

## 2019-12-10 DIAGNOSIS — Z20828 Contact with and (suspected) exposure to other viral communicable diseases: Secondary | ICD-10-CM | POA: Diagnosis not present

## 2019-12-10 NOTE — Telephone Encounter (Signed)
Pt called and wanted to let you know that she had [facility] and [facility] was negative and [facility] on [address] pt can be reached at [phone]

## 2019-12-10 NOTE — Telephone Encounter (Signed)
Please tell her thanks for letting us know.  She should f/u with Dr. Redmond School next week for a recheck if she isn't completely better from the antibiotics that she started.

## 2019-12-10 NOTE — Telephone Encounter (Signed)
Called and informed pt.  

## 2019-12-10 NOTE — Progress Notes (Signed)
Spoke with patient and gave her all results. She will pick up doxy today and was advised re:sun and this abx. She is scheduled to have COVID testing today at 12:40pm at [place], she will let us know if results are positive and will follow up with Dr. Redmond School next week on office if negative.

## 2019-12-18 ENCOUNTER — Other Ambulatory Visit: Payer: Self-pay

## 2019-12-18 ENCOUNTER — Encounter: Payer: Self-pay | Admitting: Medical

## 2019-12-18 ENCOUNTER — Ambulatory Visit (INDEPENDENT_AMBULATORY_CARE_PROVIDER_SITE_OTHER): Payer: Medicare HMO | Admitting: Medical

## 2019-12-18 VITALS — BP 106/60 | HR 87 | Temp 97.9°F | Ht 62.75 in | Wt 97.8 lb

## 2019-12-18 DIAGNOSIS — R10819 Abdominal tenderness, unspecified site: Secondary | ICD-10-CM | POA: Insufficient documentation

## 2019-12-18 DIAGNOSIS — E039 Hypothyroidism, unspecified: Secondary | ICD-10-CM | POA: Diagnosis not present

## 2019-12-18 DIAGNOSIS — R05 Cough: Secondary | ICD-10-CM

## 2019-12-18 DIAGNOSIS — R634 Abnormal weight loss: Secondary | ICD-10-CM | POA: Insufficient documentation

## 2019-12-18 DIAGNOSIS — R059 Cough, unspecified: Secondary | ICD-10-CM

## 2019-12-18 DIAGNOSIS — R430 Anosmia: Secondary | ICD-10-CM | POA: Diagnosis not present

## 2019-12-18 DIAGNOSIS — J40 Bronchitis, not specified as acute or chronic: Secondary | ICD-10-CM

## 2019-12-18 MED ORDER — AMOXICILLIN 875 MG PO TABS
875.0000 mg | ORAL_TABLET | Freq: Two times a day (BID) | ORAL | 0 refills | Status: DC
Start: 2019-12-18 — End: 2020-01-07

## 2019-12-18 MED ORDER — PROMETHAZINE-DM 6.25-15 MG/5ML PO SYRP
5.0000 mL | ORAL_SOLUTION | Freq: Four times a day (QID) | ORAL | 0 refills | Status: DC | PRN
Start: 2019-12-18 — End: 2020-01-07

## 2019-12-18 NOTE — Progress Notes (Signed)
Subjective:  Deborah Travis is a 79 y.o. female who presents for Chief Complaint  Patient presents with  . Cough    bronchitis-had chest x-ray done  . Diarrhea     Here for not feeling well.  Saw Dr. Tomi Bamberger virtually last week for bronchitis, had chest xray, was prescribed medication.   Not feeling 100%.   Appetite is decreased.   She notes losing sense of smell and taste 2 years ago and has persisted loss of taste and smell.   everything tastes like cardboard.   Never can finish a meal.   Gets nauseated.   Having some loose stool, has exocrine pancreatici insufficiency.   Takes enzymes.  This is chronic.    Currently coughing a lot, can't get full night of rest due to coughing, has head congestion, having some green nasal discharge. No fever, no chills.   Has some abdominal discomfort.  Thinks this is from the pancreas issue.  No back pain.    No problems with urination or defection, no blood in stool or urine.   She quit tobacco a few years ago but started tobacco age 57yo.  She smoked for 30 + years, but quit 2 different times over the years.  Last PFT years ago.  No recent inhaler use in few years.   No other aggravating or relieving factors.    No other c/o.  Past Medical History:  Diagnosis Date  . Chronic kidney disease    RENAL STONE  . Chronic pancreatitis ([place])   . Diabetes mellitus   . Duodenal ulcer   . Dyslipidemia   . GERD (gastroesophageal reflux disease)   . Hypertension   . Osteopenia   . Thyroid disease    HYPOTHYROID  . Vitamin D deficiency    Current Outpatient Medications on File Prior to Visit  Medication Sig Dispense Refill  . Accu-Chek FastClix Lancets MISC 1 Device by Does not apply route 2 (two) times daily. 15 each 3  . Alcohol Swabs (B-D SINGLE USE SWABS BUTTERFLY) PADS 1 each by Does not apply route 2 (two) times daily. 300 each 3  . b complex vitamins capsule Take 1 capsule by mouth daily.    . Blood Glucose Calibration (ACCU-CHEK AVIVA) SOLN Use  as directed 3 each 0  . Blood Glucose Monitoring Suppl (ACCU-CHEK AVIVA PLUS) w/Device KIT Use as directed Patient is to test BID DX E11.9 1 kit 0  . cholecalciferol (VITAMIN D3) 25 MCG (1000 UT) tablet Take 1,000 Units by mouth 2 (two) times a day.    . Coenzyme Q10 (CO Q 10 PO) Take 1 tablet by mouth daily.     Marland Kitchen glucose blood test strip THIS IS FOR Accucheck Aviva Plus Use as instructed PATIENT IS TO TEST ONE TIME A DAY DX: E11.9 100 each 3  . hydroxychloroquine (PLAQUENIL) 200 MG tablet Take 1 tablet (200 mg total) by mouth daily. 30 tablet 0  . leflunomide (ARAVA) 20 MG tablet Take 1 tablet (20 mg total) by mouth daily. 90 tablet 0  . levothyroxine (SYNTHROID) 100 MCG tablet TAKE 1 TABLET EVERY DAY 90 tablet 0  . lisinopril (ZESTRIL) 10 MG tablet TAKE 1 TABLET EVERY DAY 90 tablet 0  . Menthol (RICOLA) LOZG Use as directed 1 each in the mouth or throat as needed.    . metFORMIN (GLUCOPHAGE) 1000 MG tablet TAKE 1 TABLET TWICE DAILY 180 tablet 1  . pravastatin (PRAVACHOL) 20 MG tablet TAKE 1 TABLET EVERY DAY 90 tablet  0  . traMADol (ULTRAM) 50 MG tablet Take 1 tablet (50 mg total) by mouth every 6 (six) hours as needed. 30 tablet 0  . VITAMIN E COMPLEX PO Take 1 capsule by mouth daily.     Marland Kitchen ZENPEP 25000-79000 units CPEP Take 1 tablet by mouth in the morning, at noon, and at bedtime.     . Zinc Sulfate (ZINC 15 PO) Take 15 mg by mouth daily.    . Multiple Vitamin (MULITIVITAMIN WITH MINERALS) TABS Take 1 tablet by mouth daily.    Marland Kitchen VITAMIN A PO Take 1 tablet by mouth daily.      No current facility-administered medications on file prior to visit.     The following portions of the patient's history were reviewed and updated as appropriate: allergies, current medications, past family history, past medical history, past social history, past surgical history and problem list.  ROS Otherwise as in subjective above  Objective: BP 106/60   Pulse 87   Temp 97.9 F (36.6 C)   Ht 5' 2.75"  (1.594 m)   Wt 97 lb 12.8 oz (44.4 kg)   SpO2 96%   BMI 17.46 kg/m    Wt Readings from Last 3 Encounters:  12/18/19 97 lb 12.8 oz (44.4 kg)  12/09/19 103 lb (46.7 kg)  10/07/19 103 lb 12.8 oz (47.1 kg)    General appearance: alert, no distress, well developed, well nourished HEENT: normocephalic, sclerae anicteric, conjunctiva pink and moist, TMs pearly, nares patent, no discharge or erythema, pharynx normal Oral cavity: MMM, no lesions Neck: supple, no lymphadenopathy, no thyromegaly, no masses Heart: RRR, normal S1, S2, no murmurs Lungs: CTA bilaterally, no wheezes, rhonchi, or rales Abdomen: +bs, soft, mildly tender right abdomen in general, otherwise non tender, non distended, no masses, no hepatomegaly, no splenomegaly Back: nontender Pulses: 2+ radial pulses, 2+ pedal pulses, normal cap refill Ext: no edema  Reviewed chest xray from 12/09/19 that was normal  Reviewed CT abdomen pelvis, 01/20/19, impression of acute pancreatitis proximal to pancreatic duct calculi, background of chronic pancreatitis after Whipple procedure  PFT reviewed from today  Reviewed labs from 10/06/2019 CMET and CBC, multiple myeloma panel from 07/2019, and last TSH 12/2018.     Assessment: Encounter Diagnoses  Name Primary?  . Cough Yes  . Bronchitis   . Weight loss   . Loss of smell   . Hypothyroidism, unspecified type   . Abdominal tenderness, rebound tenderness presence not specified, unspecified location      Plan: We discussed her current symptoms and exam findings.  I reviewed her visit notes from last week with Dr. Tomi Bamberger as well as a chest x-ray that was normal.  She completed a round of doxycycline.  Since she still has sinus pressure and thick mucus we will do a round of amoxicillin cough syrup as below at her request for stronger cough suppression.  Of note she has itching with codeine so we will avoid Hycodan syrup  Hydrate well, rest, and follow-up in 2 weeks with Dr. Redmond School for  physical and labs with plan  I reminded her to talk with Dr. Dimas Millin at her visit about checking her thyroid since it has been about a year since her last thyroid check.  I also recommended she discuss the chronic loss of smell and taste and potential referral to ENT.  She wants to wait and talk to him about this  She also has low BMI, weight loss in recent years.  She has had some  of her cancer screens updated, and she already sees oncology for prior abnormal labs and MGUS  She declines thyroid labs or other eval today  Elayah was seen today for cough and diarrhea.  Diagnoses and all orders for this visit:  Cough -     Spirometry with graph  Bronchitis -     Spirometry with graph  Weight loss  Loss of smell  Hypothyroidism, unspecified type  Abdominal tenderness, rebound tenderness presence not specified, unspecified location  Other orders -     amoxicillin (AMOXIL) 875 MG tablet; Take 1 tablet (875 mg total) by mouth 2 (two) times daily. -     promethazine-dextromethorphan (PROMETHAZINE-DM) 6.25-15 MG/5ML syrup; Take 5 mLs by mouth 4 (four) times daily as needed for cough.    Follow up: soon as planned for check up and labs with Dr. Redmond School.  Consider ENT referral for chronic loss of smell and taste, consider head scan, repeat thyroid labs next visit

## 2019-12-30 ENCOUNTER — Ambulatory Visit (INDEPENDENT_AMBULATORY_CARE_PROVIDER_SITE_OTHER): Payer: Medicare HMO | Admitting: Family Medicine

## 2019-12-30 ENCOUNTER — Other Ambulatory Visit: Payer: Self-pay

## 2019-12-30 ENCOUNTER — Encounter: Payer: Self-pay | Admitting: Family Medicine

## 2019-12-30 VITALS — BP 118/72 | HR 86 | Temp 97.8°F | Wt 97.6 lb

## 2019-12-30 DIAGNOSIS — E1169 Type 2 diabetes mellitus with other specified complication: Secondary | ICD-10-CM | POA: Diagnosis not present

## 2019-12-30 DIAGNOSIS — I152 Hypertension secondary to endocrine disorders: Secondary | ICD-10-CM

## 2019-12-30 DIAGNOSIS — E785 Hyperlipidemia, unspecified: Secondary | ICD-10-CM | POA: Diagnosis not present

## 2019-12-30 DIAGNOSIS — R430 Anosmia: Secondary | ICD-10-CM | POA: Diagnosis not present

## 2019-12-30 DIAGNOSIS — I10 Essential (primary) hypertension: Secondary | ICD-10-CM | POA: Diagnosis not present

## 2019-12-30 DIAGNOSIS — E039 Hypothyroidism, unspecified: Secondary | ICD-10-CM | POA: Diagnosis not present

## 2019-12-30 DIAGNOSIS — M0600 Rheumatoid arthritis without rheumatoid factor, unspecified site: Secondary | ICD-10-CM

## 2019-12-30 DIAGNOSIS — E118 Type 2 diabetes mellitus with unspecified complications: Secondary | ICD-10-CM | POA: Diagnosis not present

## 2019-12-30 DIAGNOSIS — J40 Bronchitis, not specified as acute or chronic: Secondary | ICD-10-CM

## 2019-12-30 DIAGNOSIS — R634 Abnormal weight loss: Secondary | ICD-10-CM | POA: Diagnosis not present

## 2019-12-30 DIAGNOSIS — E1159 Type 2 diabetes mellitus with other circulatory complications: Secondary | ICD-10-CM | POA: Diagnosis not present

## 2019-12-30 LAB — POCT GLYCOSYLATED HEMOGLOBIN (HGB A1C): Hemoglobin A1C: 7.1 % — AB (ref 4.0–5.6)

## 2019-12-30 MED ORDER — AMOXICILLIN-POT CLAVULANATE 875-125 MG PO TABS: 1.0000 | ORAL_TABLET | Freq: Two times a day (BID) | ORAL | 0 refills | Status: DC

## 2019-12-30 NOTE — Progress Notes (Signed)
Subjective:    Patient ID: Deborah Travis, female    DOB: June 22, 1941, 79 y.o.   MRN: [id number]  Deborah Travis is a 79 y.o. female who presents for follow-up of Type 2 diabetes mellitus.  She continues on Metformin and having no difficulty with that.  She is also taking Pravachol for her cholesterol.  Continues on lisinopril as well as Synthroid having no difficulty with them.  She has had difficulty with maintaining her weight.  She has had loss of taste since November 2019 prior to Covid.  This does interfere with her eating.  She states that she has been on 2 antibiotics most recently being on Amoxil and states she is roughly 50% better but is coughing during the day as well as at night.  She does have underlying rheumatoid arthritis and continues on DMARDs.  She does complain of some arthritic type symptoms.  Patient is checking home blood sugars.   Home blood sugar records: 115-132 How often is blood sugars being checked: once in the a.m. Current symptoms/problems include no symptoms from diabetes and have been unchanged. Daily foot checks: Yes no problems Last eye exam: about 6 months ago has aot coming up soon Exercise: Walks 1 mile daily and stretches  The following portions of the patient's history were reviewed and updated as appropriate: allergies, current medications, past medical history, past social history and problem list.  ROS as in subjective above.     Objective:    Physical Exam Alert and in no distress. Tympanic membranes and canals are normal. Pharyngeal area is normal. Neck is supple without adenopathy or thyromegaly. Cardiac exam shows a regular sinus rhythm without murmurs or gallops. Lungs are clear to auscultation.  Blood pressure 118/72, pulse 86, temperature 97.8 F (36.6 C), weight 97 lb 9.6 oz (44.3 kg).  Lab Review Diabetic Labs Latest Ref Rng & Units 10/06/2019 09/01/2019 08/03/2019 07/02/2019 04/29/2019  HbA1c 4.0 - 5.6 % - 7.1(A) - - 6.7(A)  Microalbumin  mg/L - - - - -  Micro/Creat Ratio - - - - - -  Chol 100 - 199 mg/dL - - - - -  HDL >39 mg/dL - - - - -  Calc LDL 0 - 99 mg/dL - - - - -  Triglycerides 0 - 149 mg/dL - - - - -  Creatinine 0.60 - 0.93 mg/dL 1.13(H) - 1.00 1.02(H) -   BP/Weight 12/30/2019 12/18/2019 12/09/2019 [id number]  Systolic BP 123456 [id number] - 99 [id number]  Diastolic BP 72 60 - 63 70  Wt. (Lbs) 97.6 97.8 103 103.8 104  BMI 17.43 17.46 18.39 18.53 18.57   Foot/eye exam completion dates Latest Ref Rng & Units 09/17/2019 12/29/2018  Eye Exam No Retinopathy No Retinopathy -  Foot Form Completion - - Done   Hemoglobin A1c is 7.1 Deborah Travis  reports that she quit smoking about 4 years ago. She smoked 0.10 packs per day. She has never used smokeless tobacco. She reports that she does not drink alcohol or use drugs.     Type 2 diabetes with complication (HCC)  Bronchitis - Plan: amoxicillin-clavulanate (AUGMENTIN) 875-125 MG tablet  Weight loss  Loss of smell  Hypothyroidism, unspecified type - Plan: TSH  Seronegative rheumatoid arthritis ([place])  Hypertension associated with diabetes ([place])  Hyperlipidemia associated with type 2 diabetes mellitus ([place]) - Plan: Lipid panel   1. Rx changes: I will add Augmentin to her regimen.  Also recommend she add Prilosec to see if that will help with  her coughing.  She will keep me informed concerning this. 2. Education: Reviewed 'ABCs' of diabetes management (respective goals in parentheses):  A1C (<7), blood pressure (<130/80), and cholesterol (LDL <100). 3. Compliance at present is estimated to be good. Efforts to improve compliance (if necessary) will be directed at Discussed adding a protein drink to her regimen to help to maintain and possibly even gain weight.. 4. Follow up: 4 months

## 2019-12-30 NOTE — Patient Instructions (Addendum)
Take a total of 40 mg of Prilosec You can add 2 Tylenol 4 times per day for the arthritis Add an extra protein drink

## 2019-12-30 NOTE — Addendum Note (Signed)
Addended by: Linus Salmons, Wana [place] D on: 12/30/2019 09:13 AM   Modules accepted: Orders

## 2019-12-31 ENCOUNTER — Other Ambulatory Visit: Payer: Self-pay

## 2019-12-31 DIAGNOSIS — E039 Hypothyroidism, unspecified: Secondary | ICD-10-CM

## 2019-12-31 LAB — LIPID PANEL
Chol/HDL Ratio: 2.1 ratio (ref 0.0–4.4)
Cholesterol, Total: 119 mg/dL (ref 100–199)
HDL: 58 mg/dL (ref >39)
LDL Chol Calc (NIH): 32 mg/dL (ref 0–99)
Triglycerides: 184 mg/dL — ABNORMAL HIGH (ref 0–149)
VLDL Cholesterol Cal: 29 mg/dL (ref 5–40)

## 2019-12-31 LAB — TSH: TSH: 21 u[IU]/mL — ABNORMAL HIGH (ref 0.450–4.500)

## 2019-12-31 NOTE — Progress Notes (Signed)
Office Visit Note  Patient: Deborah Travis             Date of Birth: 23-Feb-1941           MRN: [id number]             PCP: [name], MD Referring: [name], MD Visit Date: 01/07/2020 Occupation: @GUAROCC @  Subjective:  Left wrist pain   History of Present Illness: Deborah Travis is a 79 y.o. female with history of seronegative rheumatoid arthritis and DDD.  She is taking Arava 20 mg 1 tablet by mouth daily and Plaquenil 200 mg 1 tablet daily.  She has noticed any improvement in her joint pain and inflammation since adding on Plaquenil as combination therapy.  She continues to have some pain and inflammation on the outer aspect of the left wrist and right first MCP joint but states that the joint swelling has improved.  She denies any other joint pain or joint swelling at this time.  She denies any neck or lower back pain.   Activities of Daily Living:  Patient reports morning stiffness for 0 minutes.   Patient Denies nocturnal pain.  Difficulty dressing/grooming: Reports Difficulty climbing stairs: Denies Difficulty getting out of chair: Denies Difficulty using hands for taps, buttons, cutlery, and/or writing: Reports  Review of Systems  Constitutional: Negative for fatigue.  HENT: Negative for mouth sores, mouth dryness and nose dryness.   Eyes: Negative for itching and dryness.  Respiratory: Negative for shortness of breath and difficulty breathing.   Cardiovascular: Negative for chest pain and palpitations.  Gastrointestinal: Positive for diarrhea. Negative for blood in stool and constipation.  Endocrine: Negative for increased urination.  Genitourinary: Negative for difficulty urinating.  Musculoskeletal: Positive for arthralgias, joint pain and joint swelling. Negative for myalgias, morning stiffness, muscle tenderness and myalgias.  Skin: Negative for color change, rash, hair loss and redness.  Allergic/Immunologic: Negative for susceptible to infections.    Neurological: Negative for dizziness, numbness, headaches, memory loss and weakness.  Hematological: Positive for bruising/bleeding tendency.  Psychiatric/Behavioral: Negative for confusion and sleep disturbance.    PMFS History:  Patient Active Problem List   Diagnosis Date Noted  . Loss of smell 12/18/2019  . Weight loss 12/18/2019  . Abdominal tenderness 12/18/2019  . History of pancreatitis 01/20/2019  . Atherosclerosis 08/08/2018  . Glaucoma 05/26/2018  . Hypertensive retinopathy 05/26/2018  . Seronegative rheumatoid arthritis ([place]) 03/04/2018  . Senile purpura ([place]) 02/01/2015  . Former smoker 10/12/2013  . History of peptic ulcer disease 07/13/2013  . Osteopenia 05/07/2013  . Hypothyroidism 01/12/2012  . Type 2 diabetes with complication ([place]) [id number]  . Hypertension associated with diabetes ([place]) 05/21/2011  . Exocrine pancreatic insufficiency 05/21/2011  . Hyperlipidemia associated with type 2 diabetes mellitus ([place]) 05/21/2011    Past Medical History:  Diagnosis Date  . Chronic kidney disease    RENAL STONE  . Chronic pancreatitis ([place])   . Diabetes mellitus   . Duodenal ulcer   . Dyslipidemia   . GERD (gastroesophageal reflux disease)   . Hypertension   . Osteopenia   . Thyroid disease    HYPOTHYROID  . Vitamin D deficiency     Family History  Problem Relation Age of Onset  . Stroke Mother   . Heart disease Father   . Healthy Daughter   . Healthy Daughter    Past Surgical History:  Procedure Laterality Date  . APPENDECTOMY    . CATARACT EXTRACTION Right 2014  .  CHOLECYSTECTOMY    . TONSILECTOMY, ADENOIDECTOMY, BILATERAL MYRINGOTOMY AND TUBES    . [place]   Social History   Social History Narrative  . Not on file   Immunization History  Administered Date(s) Administered  . Fluad Quad(high Dose 65+) 06/01/2019  . Influenza Split 05/21/2011, 06/12/2012, 07/15/2013  . Influenza, High Dose Seasonal PF 07/13/2013, 07/01/2014,  06/01/2015, 06/15/2016, 04/15/2017, 05/19/2018  . Pneumococcal Conjugate-13 06/15/2016  . Pneumococcal Polysaccharide-23 06/12/2012  . Tdap 06/28/2011  . Zoster 06/27/2013     Objective: Vital Signs: BP 93/62 (BP Location: Left Arm, Patient Position: Sitting, Cuff Size: Normal)   Pulse 77   Resp 16   Ht 5' 2.75" (1.594 m)   Wt 100 lb 9.6 oz (45.6 kg)   LMP  (LMP Unknown)   BMI 17.96 kg/m    Physical Exam Vitals and nursing note reviewed.  Constitutional:      Appearance: She is well-developed.  HENT:     Head: Normocephalic and atraumatic.  Eyes:     Conjunctiva/sclera: Conjunctivae normal.  Pulmonary:     Effort: Pulmonary effort is normal.  Abdominal:     General: Bowel sounds are normal.     Palpations: Abdomen is soft.  Musculoskeletal:     Cervical back: Normal range of motion.  Lymphadenopathy:     Cervical: No cervical adenopathy.  Skin:    General: Skin is warm and dry.     Capillary Refill: Capillary refill takes less than 2 seconds.  Neurological:     Mental Status: She is alert and oriented to person, place, and time.  Psychiatric:        Behavior: Behavior normal.      Musculoskeletal Exam: C-spine, thoracic spine, lumbar spine good range of motion.  Shoulder joints and elbow joints have good range of motion with no discomfort.  Right wrist has good range of motion with no tenderness or synovitis.  Synovial thickening of both wrist joints noted.  She has tenderness and inflammation on the ulnar aspect of the left wrist.  She has tenderness of the right first MCP joint.  Synovial thickening and subluxation of bilateral first MCP joints.  She has PIP and DIP thickening consistent with osteoarthritis of both hands.  Hip joints, knee joints, and ankle joints have good range of motion with no discomfort.  No warmth or effusion of knee joints noted.  CDAI Exam: CDAI Score: 3.2  Patient Global: 1 mm; Provider Global: 1 mm Swollen: 1 ; Tender: 2  Joint Exam  01/07/2020      Right  Left  Wrist     Swollen Tender  MCP 1   Tender        Investigation: No additional findings.  Imaging: DG Chest 2 View  Result Date: 12/09/2019 CLINICAL DATA:  Cough, fever and shortness of breath 10 days. EXAM: CHEST - 2 VIEW COMPARISON:  07/17/2018 FINDINGS: Lungs are adequately inflated and otherwise clear. Cardiomediastinal silhouette and remainder of the exam is unchanged. IMPRESSION: No active cardiopulmonary disease. Electronically Signed   By: Marin Olp M.D.   On: 12/09/2019 19:16    Recent Labs: Lab Results  Component Value Date   WBC 8.2 10/06/2019   HGB 11.9 10/06/2019   PLT 349 10/06/2019   NA 132 (L) 10/06/2019   K 4.9 10/06/2019   CL 96 (L) 10/06/2019   CO2 26 10/06/2019   GLUCOSE 200 (H) 10/06/2019   BUN 16 10/06/2019   CREATININE 1.13 (H) 10/06/2019  BILITOT 0.3 10/06/2019   ALKPHOS 122 08/03/2019   AST 16 10/06/2019   ALT 10 10/06/2019   PROT 6.1 10/06/2019   ALBUMIN 3.8 08/03/2019   CALCIUM 9.2 10/06/2019   GFRAA 55 (L) 10/06/2019   QFTBGOLDPLUS NEGATIVE 03/30/2019    Speciality Comments: PLQ Eye Exam: 09/17/19 WNL @ [facility] Follow up in 6 months   Procedures:  No procedures performed Allergies: Levofloxacin and Codeine   Assessment / Plan:     Visit Diagnoses: Seronegative rheumatoid arthritis ([place]) - RF-,CCP-, 14-3-3 eta negative, Uric acid 4.8, ANA negative, no erosive changes on XR of hands: She has ongoing tenderness and inflammation on the ulnar aspect of the left wrist.  She has tenderness of the right first MCP joint but no synovitis was noted.  She has noticed significant improvement in her symptoms since adding on Plaquenil 200 mg 1 tablet by mouth daily to her current regimen of Arava 20 mg by mouth daily.  She is tolerating both medications without any side effects.  We discussed scheduling ultrasound-guided left wrist cortisone injection but she declined at this time.  She will continue taking Arava and  Plaquenil as prescribed.  A refill of [place] will be sent to the pharmacy today.  She was advised to notify us if her joint pain persists or worsens.  A prescription for left wrist brace was given.  She is not interested in more aggressive therapy.  She will follow-up in the office in 5 months.  High risk medication use - Arava 20 mg 1 tablet by mouth daily, Plaquenil 200 mg 1 tablet by mouth daily. PLQ Eye Exam: 09/17/19.  She has an upcoming eye exam in July 2021.  CBC and CMP were drawn on 10/06/2019.  She will be due to update lab work today and every 3 months to monitor for drug toxicity.  Standing orders for CBC and CMP are in place.- Plan: CBC with Differential/Platelet, COMPLETE METABOLIC PANEL WITH GFR  DDD (degenerative disc disease), cervical: She has good ROM with no discomfort.  No symptoms of radiculopathy.    Osteopenia of multiple sites: DEXA on 02/18/18: The BMD measured at Femur Total Right is 0.700 g/cm2 with a T-score of -2.4.  She will be due to update DEXA in June 2021.  She is taking a vitamin D supplement.   Vitamin D deficiency: She is taking vitamin D 1,000 units twice daily.   Other medical conditions are listed as follows:   Hypertension associated with diabetes ([place])  History of gastroesophageal reflux (GERD)  History of chronic pancreatitis  History of hypothyroidism  Senile purpura ([place])  Hyperlipidemia associated with type 2 diabetes mellitus ([place])  History of peptic ulcer disease  Former smoker  Orders: Orders Placed This Encounter  Procedures  . CBC with Differential/Platelet  . COMPLETE METABOLIC PANEL WITH GFR   Meds ordered this encounter  Medications  . leflunomide (ARAVA) 20 MG tablet    Sig: Take 1 tablet (20 mg total) by mouth daily.    Dispense:  90 tablet    Refill:  0      Follow-Up Instructions: Return in about 5 months (around 06/08/2020) for Rheumatoid arthritis.   Hazel Sams, PA-C  I examined and evaluated the patient with  Hazel Sams PA.  She is doing better on the combination of her event Plaquenil.  She still continues to have some synovitis in her left wrist joint.  I offered cortisone injection which she declined.  I will give her  a prescription for a wrist brace which she can use when she is doing activities.  She also has some discomfort in her right thumb which is due to underlying subluxation.  No synovitis was noted.  I also discussed other medication options like anti-TNF's which she declined.  I have advised her to contact us in case her symptoms get worse.  The plan of care was discussed as noted above.  [name], MD  Note - This record has been created using Editor, commissioning.  Chart creation errors have been sought, but may not always  have been located. Such creation errors do not reflect on  the standard of medical care.

## 2020-01-07 ENCOUNTER — Encounter: Payer: Self-pay | Admitting: Rheumatology

## 2020-01-07 ENCOUNTER — Other Ambulatory Visit: Payer: Self-pay

## 2020-01-07 ENCOUNTER — Ambulatory Visit: Payer: Medicare HMO | Admitting: Rheumatology

## 2020-01-07 VITALS — BP 93/62 | HR 77 | Resp 16 | Ht 62.75 in | Wt 100.6 lb

## 2020-01-07 DIAGNOSIS — I10 Essential (primary) hypertension: Secondary | ICD-10-CM

## 2020-01-07 DIAGNOSIS — M0600 Rheumatoid arthritis without rheumatoid factor, unspecified site: Secondary | ICD-10-CM | POA: Diagnosis not present

## 2020-01-07 DIAGNOSIS — D692 Other nonthrombocytopenic purpura: Secondary | ICD-10-CM

## 2020-01-07 DIAGNOSIS — M8589 Other specified disorders of bone density and structure, multiple sites: Secondary | ICD-10-CM | POA: Diagnosis not present

## 2020-01-07 DIAGNOSIS — Z87891 Personal history of nicotine dependence: Secondary | ICD-10-CM

## 2020-01-07 DIAGNOSIS — Z79899 Other long term (current) drug therapy: Secondary | ICD-10-CM | POA: Diagnosis not present

## 2020-01-07 DIAGNOSIS — E559 Vitamin D deficiency, unspecified: Secondary | ICD-10-CM | POA: Diagnosis not present

## 2020-01-07 DIAGNOSIS — I152 Hypertension secondary to endocrine disorders: Secondary | ICD-10-CM

## 2020-01-07 DIAGNOSIS — M5030 Other cervical disc degeneration, unspecified cervical region: Secondary | ICD-10-CM | POA: Diagnosis not present

## 2020-01-07 DIAGNOSIS — Z8719 Personal history of other diseases of the digestive system: Secondary | ICD-10-CM | POA: Diagnosis not present

## 2020-01-07 DIAGNOSIS — E1159 Type 2 diabetes mellitus with other circulatory complications: Secondary | ICD-10-CM | POA: Diagnosis not present

## 2020-01-07 DIAGNOSIS — Z8639 Personal history of other endocrine, nutritional and metabolic disease: Secondary | ICD-10-CM | POA: Diagnosis not present

## 2020-01-07 DIAGNOSIS — E785 Hyperlipidemia, unspecified: Secondary | ICD-10-CM

## 2020-01-07 DIAGNOSIS — Z8711 Personal history of peptic ulcer disease: Secondary | ICD-10-CM

## 2020-01-07 DIAGNOSIS — E1169 Type 2 diabetes mellitus with other specified complication: Secondary | ICD-10-CM

## 2020-01-07 MED ORDER — LEFLUNOMIDE 20 MG PO TABS: 20.0000 mg | ORAL_TABLET | Freq: Every day | ORAL | 0 refills | Status: DC

## 2020-01-07 NOTE — Patient Instructions (Signed)
Standing Labs We placed an order today for your standing lab work.    Please come back and get your standing labs in August and every 3 months   We have open lab daily Monday through Thursday from 8:30-12:30 PM and 1:30-4:30 PM and Friday from 8:30-12:30 PM and 1:30-4:00 PM at the office of Dr. Layth Cerezo.   You may experience shorter wait times on Monday and Friday afternoons. The office is located at [address], [address], [place], [place] [address] No appointment is necessary.   Labs are drawn by [facility].  You may receive a bill from [facility] for your lab work.  If you wish to have your labs drawn at another location, please call the office 24 hours in advance to send orders.  If you have any questions regarding directions or hours of operation,  please call [phone].   Just as a reminder please drink plenty of water prior to coming for your lab work. Thanks!   

## 2020-01-08 ENCOUNTER — Other Ambulatory Visit: Payer: Self-pay | Admitting: Rheumatology

## 2020-01-08 ENCOUNTER — Telehealth: Payer: Self-pay | Admitting: Family Medicine

## 2020-01-08 DIAGNOSIS — M0600 Rheumatoid arthritis without rheumatoid factor, unspecified site: Secondary | ICD-10-CM

## 2020-01-08 LAB — COMPLETE METABOLIC PANEL WITH GFR
AG Ratio: 1.3 (calc) (ref 1.0–2.5)
ALT: 24 U/L (ref 6–29)
AST: 25 U/L (ref 10–35)
Albumin: 3.9 g/dL (ref 3.6–5.1)
Alkaline phosphatase (APISO): 63 U/L (ref 37–153)
BUN/Creatinine Ratio: 26 (calc) — ABNORMAL HIGH (ref 6–22)
BUN: 29 mg/dL — ABNORMAL HIGH (ref 7–25)
CO2: 25 mmol/L (ref 20–32)
Calcium: 9.7 mg/dL (ref 8.6–10.4)
Chloride: 105 mmol/L (ref 98–110)
Creat: 1.11 mg/dL — ABNORMAL HIGH (ref 0.60–0.93)
GFR, Est African American: 56 mL/min/{1.73_m2} — ABNORMAL LOW (ref >=60)
GFR, Est Non African American: 48 mL/min/{1.73_m2} — ABNORMAL LOW (ref >=60)
Globulin: 3.1 g/dL (calc) (ref 1.9–3.7)
Glucose, Bld: 160 mg/dL — ABNORMAL HIGH (ref 65–99)
Potassium: 5.8 mmol/L — ABNORMAL HIGH (ref 3.5–5.3)
Sodium: 137 mmol/L (ref 135–146)
Total Bilirubin: 0.3 mg/dL (ref 0.2–1.2)
Total Protein: 7 g/dL (ref 6.1–8.1)

## 2020-01-08 LAB — CBC WITH DIFFERENTIAL/PLATELET
Absolute Monocytes: 766 cells/uL (ref 200–950)
Basophils Absolute: 48 cells/uL (ref 0–200)
Basophils Relative: 0.7 %
Eosinophils Absolute: 311 cells/uL (ref 15–500)
Eosinophils Relative: 4.5 %
HCT: 39.1 % (ref 35.0–45.0)
Hemoglobin: 12.2 g/dL (ref 11.7–15.5)
Lymphs Abs: 2429 cells/uL (ref 850–3900)
MCH: 29.7 pg (ref 27.0–33.0)
MCHC: 31.2 g/dL — ABNORMAL LOW (ref 32.0–36.0)
MCV: 95.1 fL (ref 80.0–100.0)
MPV: 10 fL (ref 7.5–12.5)
Monocytes Relative: 11.1 %
Neutro Abs: 3347 cells/uL (ref 1500–7800)
Neutrophils Relative %: 48.5 %
Platelets: 317 10*3/uL (ref 140–400)
RBC: 4.11 10*6/uL (ref 3.80–5.10)
RDW: 13.6 % (ref 11.0–15.0)
Total Lymphocyte: 35.2 %
WBC: 6.9 10*3/uL (ref 3.8–10.8)

## 2020-01-08 MED ORDER — LEFLUNOMIDE 20 MG PO TABS: 20.0000 mg | ORAL_TABLET | Freq: Every day | ORAL | 0 refills | Status: DC

## 2020-01-08 MED ORDER — TRAMADOL HCL 50 MG PO TABS: 50.0000 mg | ORAL_TABLET | Freq: Four times a day (QID) | ORAL | 0 refills | Status: DC | PRN

## 2020-01-08 NOTE — Telephone Encounter (Signed)
[facility]/Pt called and left message requesting refills on Tramadol. Please send to [facility].

## 2020-01-08 NOTE — Telephone Encounter (Signed)
Ok to refill Arava 20 mg by mouth daily.

## 2020-01-08 NOTE — Progress Notes (Signed)
Potassium is elevated-5.8.  Please notify the patient and forward lab work to PCP.   Rest of lab work is stable.  She should continue to avoid NSAIDs due to low GFR.

## 2020-01-08 NOTE — Telephone Encounter (Signed)
Patient left a message requesting a refill on Leflunomide 90 day supply , if possible sent to [facility] mail order pharmacy. Patient has 15 days worth left.

## 2020-01-08 NOTE — Telephone Encounter (Signed)
Last Visit: 01/07/2020 Next Visit: 06/09/2020 Labs: 01/07/2020 Potassium is elevated-5.8. Rest of lab work is stable. She should continue to avoid NSAIDs due to low GFR.   Current Dose per office note 01/07/2020: Arava 20 mg by mouth daily  Okay to refill Arava?

## 2020-02-02 ENCOUNTER — Telehealth: Payer: Self-pay

## 2020-02-02 MED ORDER — TRAMADOL HCL 50 MG PO TABS: 50.0000 mg | ORAL_TABLET | Freq: Four times a day (QID) | ORAL | 0 refills | Status: DC | PRN

## 2020-02-02 NOTE — Telephone Encounter (Signed)
Pt. Called stating she needs a refill on her Tramadol last filled 12/30/19 next apt. 05/10/20.

## 2020-02-05 ENCOUNTER — Other Ambulatory Visit: Payer: Self-pay | Admitting: Rheumatology

## 2020-02-05 DIAGNOSIS — M0600 Rheumatoid arthritis without rheumatoid factor, unspecified site: Secondary | ICD-10-CM

## 2020-02-05 NOTE — Telephone Encounter (Addendum)
Last Visit: 01/07/2020 Next Visit: 06/09/2020 Labs: 01/07/2020 Potassium is elevated-5.8. Rest of lab work is stable. PLQ Eye Exam: 09/17/19 WNL   Current Dose per office note on 01/07/2020: Plaquenil 200 mg 1 tablet by mouth daily.  Last Fill: 10/07/2019   Okay to refill per Dr. Estanislado Pandy

## 2020-02-12 ENCOUNTER — Other Ambulatory Visit: Payer: Self-pay | Admitting: Family Medicine

## 2020-02-12 DIAGNOSIS — I152 Hypertension secondary to endocrine disorders: Secondary | ICD-10-CM

## 2020-02-12 DIAGNOSIS — E1159 Type 2 diabetes mellitus with other circulatory complications: Secondary | ICD-10-CM

## 2020-02-12 DIAGNOSIS — E1169 Type 2 diabetes mellitus with other specified complication: Secondary | ICD-10-CM

## 2020-02-15 ENCOUNTER — Other Ambulatory Visit: Payer: Self-pay

## 2020-02-15 ENCOUNTER — Telehealth (INDEPENDENT_AMBULATORY_CARE_PROVIDER_SITE_OTHER): Payer: Medicare HMO | Admitting: Family Medicine

## 2020-02-15 ENCOUNTER — Encounter: Payer: Self-pay | Admitting: Family Medicine

## 2020-02-15 VITALS — Temp 98.0°F | Wt 100.0 lb

## 2020-02-15 DIAGNOSIS — K861 Other chronic pancreatitis: Secondary | ICD-10-CM | POA: Diagnosis not present

## 2020-02-15 MED ORDER — TRAMADOL HCL 50 MG PO TABS: 50.0000 mg | ORAL_TABLET | Freq: Four times a day (QID) | ORAL | 0 refills | Status: DC | PRN

## 2020-02-15 NOTE — Progress Notes (Signed)
   Subjective:    Patient ID: Deborah Travis, female    DOB: 07/27/1941, 79 y.o.   MRN: [id number]  HPI I connected with  Deborah Travis on 02/15/20 by  telemedicine phine and verified that I am speaking with the correct person using two identifiers.  I discussed the limitations of evaluation and management by telemedicine. The patient expressed understanding and agreed to proceed. She continues have difficulty with abdominal pain requiring the use of tramadol.  She initially was given this by her gastroenterologist, Dr. Penelope Coop.  I have been filling this medication for her and now she is using a higher dose of 2 pills twice per day to control her pain.  Apparently surgery was discussed but she is not interested in pursuing this.   Review of Systems     Objective:   Physical Exam Alert and complaining of abdominal pain.  The note from Dr. Narda Amber from March is not available. PDMP was reviewed      Assessment & Plan:  Other chronic pancreatitis ([place]) - Plan: traMADol (ULTRAM) 50 MG tablet Since she is requiring increased dosing of pain relief, I recommended she follow-up with her gastroenterologist to discuss more definitive care for her pain rather than just treating the pain.  I will get his most recent note for our record and instructed her to make sure he sends me the encounter when she makes the appointment. 12 minutes spent discussing this issue with her as well as reviewing her record.

## 2020-02-16 ENCOUNTER — Telehealth: Payer: Self-pay

## 2020-02-16 NOTE — Telephone Encounter (Signed)
Pt advise that her Gi doctor is requesting her to go to [place] or [place] . He will be at the hospital most of the week. Per pt she does not think he will be sending a note to you because his nurse advised she should call you and let you know. [place]

## 2020-02-22 ENCOUNTER — Other Ambulatory Visit: Payer: Self-pay | Admitting: Rheumatology

## 2020-02-22 DIAGNOSIS — M0600 Rheumatoid arthritis without rheumatoid factor, unspecified site: Secondary | ICD-10-CM

## 2020-02-23 ENCOUNTER — Telehealth: Payer: Self-pay | Admitting: Family Medicine

## 2020-02-23 DIAGNOSIS — K861 Other chronic pancreatitis: Secondary | ICD-10-CM

## 2020-02-23 MED ORDER — TRAMADOL HCL 50 MG PO TABS: 50.0000 mg | ORAL_TABLET | Freq: Four times a day (QID) | ORAL | 0 refills | Status: DC | PRN

## 2020-02-23 NOTE — Telephone Encounter (Signed)
She is in the process of being referred to [facility] for evaluation on the pancreatic issue and still having pain. I will cover her until the referral

## 2020-02-23 NOTE — Telephone Encounter (Signed)
Pt called and is requesting a refill on her tramadol pt states she is taking 2 at time instead of 1 at a time for the pain states she called dr Narda Amber office but he was not in the office today so they sent the message to another provider and he told her to call her PCP for a the RX. She states she is waiting on the call from [facility] to go out there but has not heard from them, pt would [facility] the [id number] [place], [place] Pt can be reached at [phone]

## 2020-02-27 ENCOUNTER — Other Ambulatory Visit: Payer: Self-pay | Admitting: Family Medicine

## 2020-02-27 DIAGNOSIS — K861 Other chronic pancreatitis: Secondary | ICD-10-CM

## 2020-02-27 MED ORDER — TRAMADOL HCL 50 MG PO TABS: 50.0000 mg | ORAL_TABLET | Freq: Four times a day (QID) | ORAL | 0 refills | Status: DC | PRN

## 2020-02-27 NOTE — Progress Notes (Signed)
She is scheduled to see a GI in [place] on the 21st

## 2020-03-01 ENCOUNTER — Other Ambulatory Visit: Payer: Self-pay

## 2020-03-01 ENCOUNTER — Other Ambulatory Visit: Payer: Medicare HMO

## 2020-03-01 DIAGNOSIS — E039 Hypothyroidism, unspecified: Secondary | ICD-10-CM

## 2020-03-01 DIAGNOSIS — K861 Other chronic pancreatitis: Secondary | ICD-10-CM | POA: Diagnosis not present

## 2020-03-01 NOTE — Telephone Encounter (Signed)
[facility] is requesting to fill pt tramadol. Please advise Kh

## 2020-03-02 LAB — TSH: TSH: 18.8 u[IU]/mL — ABNORMAL HIGH (ref 0.450–4.500)

## 2020-03-02 MED ORDER — LEVOTHYROXINE SODIUM 112 MCG PO TABS
112.0000 ug | ORAL_TABLET | Freq: Every day | ORAL | 3 refills | Status: DC
Start: 2020-03-02 — End: 2020-05-13

## 2020-03-03 ENCOUNTER — Telehealth: Payer: Self-pay

## 2020-03-03 ENCOUNTER — Other Ambulatory Visit: Payer: Self-pay

## 2020-03-03 DIAGNOSIS — R112 Nausea with vomiting, unspecified: Secondary | ICD-10-CM

## 2020-03-03 DIAGNOSIS — K861 Other chronic pancreatitis: Secondary | ICD-10-CM

## 2020-03-03 NOTE — Telephone Encounter (Signed)
She continues have difficulty with abdominal pain and I think it is reasonable to reassess her with amylase lipase and CT scanning

## 2020-03-03 NOTE — Telephone Encounter (Signed)
Pt was advised [facility] 

## 2020-03-03 NOTE — Telephone Encounter (Signed)
Pt is still have issues with vomiting. She is requesting a MRI or CT scan of abd. She has reached out to GI doctor and he will not return call. Pt is in  A lot of pain.

## 2020-03-04 ENCOUNTER — Other Ambulatory Visit: Payer: Medicare HMO

## 2020-03-04 ENCOUNTER — Telehealth: Payer: Self-pay

## 2020-03-04 ENCOUNTER — Other Ambulatory Visit: Payer: Self-pay

## 2020-03-04 ENCOUNTER — Ambulatory Visit (HOSPITAL_COMMUNITY): Payer: Medicare HMO

## 2020-03-04 DIAGNOSIS — K861 Other chronic pancreatitis: Secondary | ICD-10-CM

## 2020-03-04 NOTE — Telephone Encounter (Signed)
Pt was rescheduled.[place]

## 2020-03-04 NOTE — Telephone Encounter (Signed)
A scheduler form [name] called to let us know the Deborah Travis was a no show for her CT today.

## 2020-03-07 LAB — SPECIMEN STATUS REPORT

## 2020-03-07 LAB — AMYLASE: Amylase: 48 U/L (ref 31–110)

## 2020-03-07 LAB — LIPASE: Lipase: 6 U/L — ABNORMAL LOW (ref 14–85)

## 2020-03-08 ENCOUNTER — Emergency Department (HOSPITAL_COMMUNITY)
Admission: EM | Admit: 2020-03-08 | Discharge: 2020-03-09 | Disposition: A | Discharge status: Home / Self Care | Payer: Medicare HMO | Source: Home / Self Care | Attending: Emergency Medicine | Admitting: Emergency Medicine

## 2020-03-08 ENCOUNTER — Encounter (HOSPITAL_COMMUNITY): Payer: Self-pay

## 2020-03-08 ENCOUNTER — Other Ambulatory Visit: Payer: Self-pay

## 2020-03-08 ENCOUNTER — Telehealth: Payer: Self-pay | Admitting: Family Medicine

## 2020-03-08 ENCOUNTER — Emergency Department (HOSPITAL_COMMUNITY)
Admission: EM | Admit: 2020-03-08 | Discharge: 2020-03-08 | Disposition: A | Discharge status: Home / Self Care | Payer: Medicare HMO | Attending: Emergency Medicine | Admitting: Emergency Medicine

## 2020-03-08 DIAGNOSIS — K529 Noninfective gastroenteritis and colitis, unspecified: Secondary | ICD-10-CM

## 2020-03-08 DIAGNOSIS — R197 Diarrhea, unspecified: Secondary | ICD-10-CM | POA: Diagnosis not present

## 2020-03-08 DIAGNOSIS — R1013 Epigastric pain: Secondary | ICD-10-CM | POA: Diagnosis not present

## 2020-03-08 DIAGNOSIS — I129 Hypertensive chronic kidney disease with stage 1 through stage 4 chronic kidney disease, or unspecified chronic kidney disease: Secondary | ICD-10-CM | POA: Insufficient documentation

## 2020-03-08 DIAGNOSIS — Z7984 Long term (current) use of oral hypoglycemic drugs: Secondary | ICD-10-CM | POA: Diagnosis not present

## 2020-03-08 DIAGNOSIS — I700 Atherosclerosis of aorta: Secondary | ICD-10-CM | POA: Diagnosis not present

## 2020-03-08 DIAGNOSIS — R112 Nausea with vomiting, unspecified: Secondary | ICD-10-CM | POA: Diagnosis not present

## 2020-03-08 DIAGNOSIS — E1122 Type 2 diabetes mellitus with diabetic chronic kidney disease: Secondary | ICD-10-CM | POA: Insufficient documentation

## 2020-03-08 DIAGNOSIS — I10 Essential (primary) hypertension: Secondary | ICD-10-CM | POA: Diagnosis not present

## 2020-03-08 DIAGNOSIS — Z79899 Other long term (current) drug therapy: Secondary | ICD-10-CM | POA: Insufficient documentation

## 2020-03-08 DIAGNOSIS — R1084 Generalized abdominal pain: Secondary | ICD-10-CM | POA: Diagnosis not present

## 2020-03-08 DIAGNOSIS — R001 Bradycardia, unspecified: Secondary | ICD-10-CM | POA: Diagnosis not present

## 2020-03-08 DIAGNOSIS — Z87891 Personal history of nicotine dependence: Secondary | ICD-10-CM | POA: Diagnosis not present

## 2020-03-08 DIAGNOSIS — K861 Other chronic pancreatitis: Secondary | ICD-10-CM | POA: Diagnosis not present

## 2020-03-08 DIAGNOSIS — E039 Hypothyroidism, unspecified: Secondary | ICD-10-CM | POA: Diagnosis not present

## 2020-03-08 DIAGNOSIS — N189 Chronic kidney disease, unspecified: Secondary | ICD-10-CM | POA: Diagnosis not present

## 2020-03-08 DIAGNOSIS — K859 Acute pancreatitis without necrosis or infection, unspecified: Secondary | ICD-10-CM | POA: Diagnosis not present

## 2020-03-08 LAB — URINALYSIS, ROUTINE W REFLEX MICROSCOPIC
Bilirubin Urine: NEGATIVE
Glucose, UA: NEGATIVE mg/dL
Hgb urine dipstick: NEGATIVE
Ketones, ur: 5 mg/dL — AB
Leukocytes,Ua: NEGATIVE
Nitrite: NEGATIVE
Protein, ur: NEGATIVE mg/dL
Specific Gravity, Urine: 1.024 (ref 1.005–1.030)
pH: 5 (ref 5.0–8.0)

## 2020-03-08 LAB — COMPREHENSIVE METABOLIC PANEL
ALT: 22 U/L (ref 0–44)
AST: 28 U/L (ref 15–41)
Albumin: 4.4 g/dL (ref 3.5–5.0)
Alkaline Phosphatase: 88 U/L (ref 38–126)
Anion gap: 12 (ref 5–15)
BUN: 46 mg/dL — ABNORMAL HIGH (ref 8–23)
CO2: 21 mmol/L — ABNORMAL LOW (ref 22–32)
Calcium: 10.2 mg/dL (ref 8.9–10.3)
Chloride: 102 mmol/L (ref 98–111)
Creatinine, Ser: 1.27 mg/dL — ABNORMAL HIGH (ref 0.44–1.00)
GFR calc Af Amer: 47 mL/min — ABNORMAL LOW (ref >60)
GFR calc non Af Amer: 41 mL/min — ABNORMAL LOW (ref >60)
Glucose, Bld: 148 mg/dL — ABNORMAL HIGH (ref 70–99)
Potassium: 5 mmol/L (ref 3.5–5.1)
Sodium: 135 mmol/L (ref 135–145)
Total Bilirubin: 0.5 mg/dL (ref 0.3–1.2)
Total Protein: 8.6 g/dL — ABNORMAL HIGH (ref 6.5–8.1)

## 2020-03-08 LAB — CBC
HCT: 40.9 % (ref 36.0–46.0)
Hemoglobin: 13.5 g/dL (ref 12.0–15.0)
MCH: 31.8 pg (ref 26.0–34.0)
MCHC: 33 g/dL (ref 30.0–36.0)
MCV: 96.2 fL (ref 80.0–100.0)
Platelets: 364 10*3/uL (ref 150–400)
RBC: 4.25 MIL/uL (ref 3.87–5.11)
RDW: 14.6 % (ref 11.5–15.5)
WBC: 10.9 10*3/uL — ABNORMAL HIGH (ref 4.0–10.5)
nRBC: 0 % (ref 0.0–0.2)

## 2020-03-08 LAB — LIPASE, BLOOD: Lipase: 15 U/L (ref 11–51)

## 2020-03-08 MED ORDER — SODIUM CHLORIDE 0.9% FLUSH: 3.0000 mL | Freq: Once | INTRAVENOUS | Status: DC

## 2020-03-08 NOTE — Telephone Encounter (Signed)
Pt's daughter called and states that patient is at [facility] ER She is in severe pain and having nausea and vomiting Per daughter they have been there about 4 hours and they wanted to know if there was anything that we could do to speed up her being seen Checked with Loletha Carrow and Sabrina, we can not speed up her being seen in the ER  Daughter asked to send note to Dr. Redmond School to make him aware that she was at ER and having pain. She is aware that Dr. Redmond School out office until Monday but requested to have message sent

## 2020-03-08 NOTE — Telephone Encounter (Signed)
Pt called and said she is out of the Tramadol and needs [facility] [address]. Pt stated that she was taking 3 a day but Dr. Redmond School told her not to so she said she has been taking 2 a day and is currently out

## 2020-03-08 NOTE — ED Notes (Signed)
Please call daughter Janyce Llanos a status update--[place]

## 2020-03-08 NOTE — ED Triage Notes (Signed)
Patient c/o abdominal pain, N/V/D x 1 month. Patient states she has a history of pancreatitis. Patient states she came today because the pain is worse.

## 2020-03-08 NOTE — ED Triage Notes (Signed)
Pt from home with ems for abd pain for the past few weeks with n/v. Hx of pancreatitis. Seen at [facility] but LWBS, blood work drawn. Pt a.o, 150mcg fent given by EMS

## 2020-03-08 NOTE — Telephone Encounter (Signed)
Pt states she is headed to the hospital now as she is in a lot of pain. I advised her that she shouldn't be out and she said she didn't have many left. And that we couldn't refill this

## 2020-03-08 NOTE — Telephone Encounter (Signed)
If she has been taking 2 or 3 pills per day since her prescription was sent in on July 3rd, then she should have several pills left.

## 2020-03-09 ENCOUNTER — Emergency Department (HOSPITAL_COMMUNITY): Payer: Medicare HMO

## 2020-03-09 DIAGNOSIS — K861 Other chronic pancreatitis: Secondary | ICD-10-CM | POA: Diagnosis not present

## 2020-03-09 DIAGNOSIS — I700 Atherosclerosis of aorta: Secondary | ICD-10-CM | POA: Diagnosis not present

## 2020-03-09 DIAGNOSIS — K529 Noninfective gastroenteritis and colitis, unspecified: Secondary | ICD-10-CM | POA: Diagnosis not present

## 2020-03-09 DIAGNOSIS — R197 Diarrhea, unspecified: Secondary | ICD-10-CM | POA: Diagnosis not present

## 2020-03-09 DIAGNOSIS — K859 Acute pancreatitis without necrosis or infection, unspecified: Secondary | ICD-10-CM | POA: Diagnosis not present

## 2020-03-09 LAB — TROPONIN I (HIGH SENSITIVITY)
Troponin I (High Sensitivity): 7 ng/L (ref <18)
Troponin I (High Sensitivity): 7 ng/L (ref <18)

## 2020-03-09 LAB — COMPREHENSIVE METABOLIC PANEL
ALT: 19 U/L (ref 0–44)
AST: 26 U/L (ref 15–41)
Albumin: 3.4 g/dL — ABNORMAL LOW (ref 3.5–5.0)
Alkaline Phosphatase: 77 U/L (ref 38–126)
Anion gap: 12 (ref 5–15)
BUN: 30 mg/dL — ABNORMAL HIGH (ref 8–23)
CO2: 19 mmol/L — ABNORMAL LOW (ref 22–32)
Calcium: 8.8 mg/dL — ABNORMAL LOW (ref 8.9–10.3)
Chloride: 105 mmol/L (ref 98–111)
Creatinine, Ser: 1.02 mg/dL — ABNORMAL HIGH (ref 0.44–1.00)
GFR calc Af Amer: >60 mL/min (ref >60)
GFR calc non Af Amer: 53 mL/min — ABNORMAL LOW (ref >60)
Glucose, Bld: 91 mg/dL (ref 70–99)
Potassium: 4.3 mmol/L (ref 3.5–5.1)
Sodium: 136 mmol/L (ref 135–145)
Total Bilirubin: 0.6 mg/dL (ref 0.3–1.2)
Total Protein: 6.8 g/dL (ref 6.5–8.1)

## 2020-03-09 LAB — CBC WITH DIFFERENTIAL/PLATELET
Abs Immature Granulocytes: 0.05 10*3/uL (ref 0.00–0.07)
Basophils Absolute: 0 10*3/uL (ref 0.0–0.1)
Basophils Relative: 0 %
Eosinophils Absolute: 0 10*3/uL (ref 0.0–0.5)
Eosinophils Relative: 0 %
HCT: 37.8 % (ref 36.0–46.0)
Hemoglobin: 11.9 g/dL — ABNORMAL LOW (ref 12.0–15.0)
Immature Granulocytes: 1 %
Lymphocytes Relative: 19 %
Lymphs Abs: 1.7 10*3/uL (ref 0.7–4.0)
MCH: 30.2 pg (ref 26.0–34.0)
MCHC: 31.5 g/dL (ref 30.0–36.0)
MCV: 95.9 fL (ref 80.0–100.0)
Monocytes Absolute: 0.8 10*3/uL (ref 0.1–1.0)
Monocytes Relative: 9 %
Neutro Abs: 6.3 10*3/uL (ref 1.7–7.7)
Neutrophils Relative %: 71 %
Platelets: 322 10*3/uL (ref 150–400)
RBC: 3.94 MIL/uL (ref 3.87–5.11)
RDW: 14.3 % (ref 11.5–15.5)
WBC: 8.9 10*3/uL (ref 4.0–10.5)
nRBC: 0 % (ref 0.0–0.2)

## 2020-03-09 LAB — LIPASE, BLOOD: Lipase: 15 U/L (ref 11–51)

## 2020-03-09 MED ORDER — IOHEXOL 300 MG/ML  SOLN
100.0000 mL | Freq: Once | INTRAMUSCULAR | Status: AC | PRN
  Administered 2020-03-09: 100 mL via INTRAVENOUS

## 2020-03-09 MED ORDER — FENTANYL CITRATE (PF) 100 MCG/2ML IJ SOLN
50.0000 ug | Freq: Once | INTRAMUSCULAR | Status: AC
  Administered 2020-03-09: 50 ug via INTRAVENOUS
  Filled 2020-03-09: qty 2

## 2020-03-09 MED ORDER — LIDOCAINE VISCOUS HCL 2 % MT SOLN
15.0000 mL | Freq: Once | OROMUCOSAL | Status: AC
  Administered 2020-03-09: 15 mL via ORAL
  Filled 2020-03-09: qty 15

## 2020-03-09 MED ORDER — ONDANSETRON HCL 4 MG/2ML IJ SOLN
4.0000 mg | Freq: Once | INTRAMUSCULAR | Status: AC
  Administered 2020-03-09: 4 mg via INTRAVENOUS
  Filled 2020-03-09: qty 2

## 2020-03-09 MED ORDER — ONDANSETRON 4 MG PO TBDP: 4.0000 mg | ORAL_TABLET | Freq: Three times a day (TID) | ORAL | 0 refills | Status: DC | PRN

## 2020-03-09 MED ORDER — SODIUM CHLORIDE 0.9 % IV BOLUS
1000.0000 mL | Freq: Once | INTRAVENOUS | Status: AC
  Administered 2020-03-09: 1000 mL via INTRAVENOUS

## 2020-03-09 MED ORDER — AMOXICILLIN-POT CLAVULANATE 875-125 MG PO TABS: 1.0000 | ORAL_TABLET | Freq: Two times a day (BID) | ORAL | 0 refills | Status: AC

## 2020-03-09 MED ORDER — FENTANYL CITRATE (PF) 100 MCG/2ML IJ SOLN: 75.0000 ug | Freq: Once | INTRAMUSCULAR | Status: DC

## 2020-03-09 MED ORDER — ALUM & MAG HYDROXIDE-SIMETH 200-200-20 MG/5ML PO SUSP
30.0000 mL | Freq: Once | ORAL | Status: AC
  Administered 2020-03-09: 30 mL via ORAL
  Filled 2020-03-09: qty 30

## 2020-03-09 MED ORDER — TRAMADOL HCL 50 MG PO TABS: 50.0000 mg | ORAL_TABLET | Freq: Three times a day (TID) | ORAL | 0 refills | Status: AC | PRN

## 2020-03-09 MED ORDER — AMOXICILLIN-POT CLAVULANATE 875-125 MG PO TABS
1.0000 | ORAL_TABLET | Freq: Once | ORAL | Status: AC
  Administered 2020-03-09: 1 via ORAL
  Filled 2020-03-09: qty 1

## 2020-03-09 MED ORDER — FENTANYL CITRATE (PF) 100 MCG/2ML IJ SOLN
100.0000 ug | Freq: Once | INTRAMUSCULAR | Status: AC
  Administered 2020-03-09: 100 ug via INTRAVENOUS
  Filled 2020-03-09: qty 2

## 2020-03-09 NOTE — ED Provider Notes (Addendum)
[facility] EMERGENCY DEPARTMENT Provider Note   CSN: [id number] Arrival date & time: 03/08/20  1900    History Chief Complaint  Patient presents with  . Abdominal Pain    Deborah Travis is a 79 y.o. female with past medical history significant for CKD, chronic pancreatitis, diabetes, duodenal ulcer, dyslipidemia, hypertension, GERD who presents for evaluation of abdominal pain.  Pain has been chronic x2 weeks.  Located to epigastric, right upper quadrant.  Will occasionally radiate into her back and under her ribs.  No associated chest pain, shortness of breath.  No urinary symptoms.  Occasional nausea and vomiting.  Was prescribed tramadol by her PCP 5 days ago however patient recently ran out of this medication.  She contacted her PCP who told her she should still have pills left if she was taking this correctly.  NBNB emesis.  No melanotic or bloody stools.  No fever, chills, chest pain, shortness of breath, lower abdominal pain, pelvic pain, vaginal discharge no fever, chills, chest pain, shortness of breath, lower abdominal pain, pelvic pain, vaginal discharge, lower leg swelling, redness or warmth.  No hemoptysis.  Denies additional aggravating relieving factors., unilateral leg swelling warmth. No hx of PE, DVT. No Cough, respiratory symptoms. Pain worse with food intake  Prior cholecystectomy and appendectomy.   History obtained from patient and past medical records. No interpretor was used.  Previously follow by Penelope Coop with [facility] however has plans to see [facility] at end of Month  Prior Whipple procedure  HPI     Past Medical History:  Diagnosis Date  . Chronic kidney disease    RENAL STONE  . Chronic pancreatitis ([place])   . Diabetes mellitus   . Duodenal ulcer   . Dyslipidemia   . GERD (gastroesophageal reflux disease)   . Hypertension   . Osteopenia   . Thyroid disease    HYPOTHYROID  . Vitamin D deficiency     Patient Active Problem List    Diagnosis Date Noted  . Loss of smell 12/18/2019  . Weight loss 12/18/2019  . Abdominal tenderness 12/18/2019  . History of pancreatitis 01/20/2019  . Atherosclerosis 08/08/2018  . Glaucoma 05/26/2018  . Hypertensive retinopathy 05/26/2018  . Seronegative rheumatoid arthritis ([place]) 03/04/2018  . Senile purpura ([place]) 02/01/2015  . Former smoker 10/12/2013  . History of peptic ulcer disease 07/13/2013  . Osteopenia 05/07/2013  . Hypothyroidism 01/12/2012  . Type 2 diabetes with complication ([place]) [id number]  . Hypertension associated with diabetes ([place]) 05/21/2011  . Exocrine pancreatic insufficiency 05/21/2011  . Hyperlipidemia associated with type 2 diabetes mellitus ([place]) 05/21/2011    Past Surgical History:  Procedure Laterality Date  . APPENDECTOMY    . CATARACT EXTRACTION Right 2014  . CHOLECYSTECTOMY    . TONSILECTOMY, ADENOIDECTOMY, BILATERAL MYRINGOTOMY AND TUBES    . WHIPPLE PROCEDURE  1998     OB History   No obstetric history on file.     Family History  Problem Relation Age of Onset  . Stroke Mother   . Heart disease Father   . Healthy Daughter   . Healthy Daughter     Social History   Tobacco Use  . Smoking status: Former Smoker    Packs/day: 0.10    Quit date: 10/31/2015    Years since quitting: 4.3  . Smokeless tobacco: Never Used  Vaping Use  . Vaping Use: Never used  Substance Use Topics  . Alcohol use: No    Alcohol/week: 0.0 standard drinks  .  Drug use: No    Home Medications Prior to Admission medications   Medication Sig Start Date End Date Taking? Authorizing Provider  b complex vitamins capsule Take 1 capsule by mouth daily.   Yes [provider]  cholecalciferol (VITAMIN D3) 25 MCG (1000 UT) tablet Take 1,000 Units by mouth 2 (two) times a day.   Yes [provider]  Coenzyme Q10 (CO Q 10) 10 MG CAPS Take 10 mg by mouth daily.    Yes [provider]  hydroxychloroquine (PLAQUENIL) 200 MG tablet TAKE 1  TABLET BY MOUTH EVERY DAY. DX:M06.00 Patient taking differently: Take 200 mg by mouth daily.  02/05/20  Yes Deveshwar, Abel Presto, MD  leflunomide (ARAVA) 20 MG tablet Take 1 tablet (20 mg total) by mouth daily. 01/08/20  Yes Deveshwar, Abel Presto, MD  levothyroxine (SYNTHROID) 112 MCG tablet Take 1 tablet (112 mcg total) by mouth daily. 03/02/20  Yes [name], MD  lisinopril (ZESTRIL) 10 MG tablet TAKE 1 TABLET EVERY DAY Patient taking differently: Take 10 mg by mouth daily.  02/12/20  Yes [name], MD  Menthol [facility]) LOZG Use as directed 1 each in the mouth or throat as needed.   Yes [provider]  metFORMIN (GLUCOPHAGE) 1000 MG tablet TAKE 1 TABLET TWICE DAILY Patient taking differently: Take 1,000 mg by mouth 2 (two) times daily with a meal. TAKE 1 TABLET TWICE DAILY 11/23/19  Yes [name], MD  Multiple Vitamin (MULITIVITAMIN WITH MINERALS) TABS Take 1 tablet by mouth daily.   Yes [provider]  pravastatin (PRAVACHOL) 20 MG tablet TAKE 1 TABLET EVERY DAY Patient taking differently: Take 20 mg by mouth daily.  02/12/20  Yes [name], MD  VITAMIN E COMPLEX PO Take 1 capsule by mouth daily.    Yes [provider]  ZENPEP 25000-79000 units CPEP Take 1 tablet by mouth in the morning, at noon, and at bedtime.  11/30/19  Yes [provider]  Zinc Sulfate (ZINC 15 PO) Take 15 mg by mouth daily.   Yes [provider]  Accu-Chek FastClix Lancets MISC 1 Device by Does not apply route 2 (two) times daily. 11/13/19   [name], MD  Alcohol Swabs (B-D SINGLE USE SWABS BUTTERFLY) PADS 1 each by Does not apply route 2 (two) times daily. 09/21/14   [name], MD  amoxicillin-clavulanate (AUGMENTIN) 875-125 MG tablet Take 1 tablet by mouth 2 (two) times daily. Patient not taking: Reported on 02/15/2020 12/30/19   [name], MD  amoxicillin-clavulanate (AUGMENTIN) 875-125 MG tablet Take 1 tablet by mouth every 12 (twelve) hours for 7  days. 03/10/20 03/17/20  Wyvonnia Dusky, MD  Blood Glucose Calibration (ACCU-CHEK AVIVA) SOLN Use as directed 09/21/14   [name], MD  Blood Glucose Monitoring Suppl (ACCU-CHEK AVIVA PLUS) w/Device KIT Use as directed Patient is to test BID DX E11.9 02/24/18   [name], MD  glucose blood test strip THIS IS FOR Accucheck Aviva Plus Use as instructed PATIENT IS TO TEST ONE TIME A DAY DX: E11.9 07/30/19   [name], MD  ondansetron (ZOFRAN ODT) 4 MG disintegrating tablet Take 1 tablet (4 mg total) by mouth every 8 (eight) hours as needed for up to 15 doses for nausea or vomiting. 03/09/20   Wyvonnia Dusky, MD  traMADol (ULTRAM) 50 MG tablet Take 1 tablet (50 mg total) by mouth every 6 (six) hours as needed. Patient not taking: Reported on 03/09/2020  02/27/20   [name], MD  traMADol (ULTRAM) 50 MG tablet Take 1 tablet (50 mg total) by mouth every 8 (eight) hours as needed for up to 3 days for severe pain. 03/09/20 03/12/20  Wyvonnia Dusky, MD   Allergies    Levofloxacin and Codeine  Review of Systems   Review of Systems  Constitutional: Negative.   HENT: Negative.   Respiratory: Negative.   Cardiovascular: Negative.   Gastrointestinal: Positive for abdominal pain, nausea and vomiting. Negative for abdominal distention, anal bleeding, blood in stool, constipation, diarrhea and rectal pain.  Genitourinary: Negative.   Musculoskeletal: Negative.   Skin: Negative.   Neurological: Negative.   All other systems reviewed and are negative.  Physical Exam Updated Vital Signs BP (!) 145/87 (BP Location: Right Arm)   Pulse 84   Temp 97.8 F (36.6 C) (Oral)   Resp 18   Ht 5' 2.75" (1.594 m)   Wt 42.6 kg   LMP  (LMP Unknown)   SpO2 98%   BMI 16.78 kg/m   Physical Exam Vitals and nursing note reviewed.  Constitutional:      General: She is not in acute distress.    Appearance: She is well-developed. She is not ill-appearing, toxic-appearing or diaphoretic.  HENT:      Head: Normocephalic and atraumatic.     Mouth/Throat:     Mouth: Mucous membranes are moist.  Eyes:     Pupils: Pupils are equal, round, and reactive to light.  Cardiovascular:     Rate and Rhythm: Normal rate.     Heart sounds: Normal heart sounds.  Pulmonary:     Effort: Pulmonary effort is normal. No respiratory distress.     Breath sounds: Normal breath sounds.     Comments: Speaks in full sentences without difficulty Abdominal:     General: Bowel sounds are normal. There is no distension.     Palpations: Abdomen is soft.     Tenderness: There is generalized abdominal tenderness and tenderness in the right upper quadrant, epigastric area and left upper quadrant. There is no right CVA tenderness, left CVA tenderness, guarding or rebound. Negative signs include Murphy's sign and McBurney's sign.     Hernia: No hernia is present.  Genitourinary:    Comments: Declines occult exam Musculoskeletal:        General: Normal range of motion.     Cervical back: Normal range of motion.     Comments: Moves all 4 extremities without difficulty. Homans sign negative  Skin:    General: Skin is warm and dry.     Capillary Refill: Capillary refill takes less than 2 seconds.     Comments: Brisk cap refill.  Neurological:     General: No focal deficit present.     Mental Status: She is alert.     Sensory: Sensation is intact.     Motor: Motor function is intact.     Coordination: Coordination is intact.     Comments: Cn 2-12 grossly intact    ED Results / Procedures / Treatments   Labs (all labs ordered are listed, but only abnormal results are displayed) Labs Reviewed  URINALYSIS, ROUTINE W REFLEX MICROSCOPIC - Abnormal; Notable for the following components:      Result Value   Color, Urine AMBER (*)    APPearance HAZY (*)    Ketones, ur 5 (*)    All other components within normal limits  CBC WITH DIFFERENTIAL/PLATELET - Abnormal; Notable for the following components:  Hemoglobin  11.9 (*)    All other components within normal limits  COMPREHENSIVE METABOLIC PANEL - Abnormal; Notable for the following components:   CO2 19 (*)    BUN 30 (*)    Creatinine, Ser 1.02 (*)    Calcium 8.8 (*)    Albumin 3.4 (*)    GFR calc non Af Amer 53 (*)    All other components within normal limits  LIPASE, BLOOD  TROPONIN I (HIGH SENSITIVITY)  TROPONIN I (HIGH SENSITIVITY)    EKG EKG Interpretation  Date/Time:  Wednesday March 09 2020 10:53:07 EDT Ventricular Rate:  92 PR Interval:  140 QRS Duration: 58 QT Interval:  424 QTC Calculation: 524 R Axis:   -70 Text Interpretation: Normal sinus rhythm Right atrial enlargement Left axis deviation Low voltage QRS Inferior infarct , age undetermined Cannot rule out Anterior infarct , age undetermined Abnormal ECG No STEMI Confirmed by Octaviano Glow [phone]) on 03/09/2020 1:21:30 PM   Radiology CT Abdomen Pelvis W Contrast  Result Date: 03/09/2020 CLINICAL DATA:  Epigastric abdominal pain. EXAM: CT ABDOMEN AND PELVIS WITH CONTRAST TECHNIQUE: Multidetector CT imaging of the abdomen and pelvis was performed using the standard protocol following bolus administration of intravenous contrast. CONTRAST:  125m OMNIPAQUE IOHEXOL 300 MG/ML  SOLN COMPARISON:  01/20/2019 FINDINGS: Lower chest: No acute abnormality. Hepatobiliary: Again seen is a geographic area of low attenuation within the lateral segment of left hepatic lobe spanning the falciform ligament. This measures 4.6 x 1.7 cm and is favored to represent focal fatty deposition. Unchanged subcentimeter low-density structure within the inferior right hepatic lobe measuring 5 mm, image 22/3. Previous cholecystectomy. Pneumobilia is again noted compatible with biliary patency. Pancreas: Postoperative changes from Whipple procedure. Signs of chronic pancreatitis involving the remaining body and tail of pancreas identified. No signs of acute pancreatitis. Spleen: Normal in size without focal  abnormality. Adrenals/Urinary Tract: Normal appearance of the adrenal glands. Bilateral areas of renal cortical scarring. Multiple kidney cysts. No solid enhancing mass or hydronephrosis identified at this time. Urinary bladder is unremarkable. Stomach/Bowel: The stomach appears nondistended. Abnormal inflammatory changes are noted involving a loop of small bowel within the right upper quadrant of the abdomen just distal to the choledochojejunostomy, image 26/3. There is mucosal enhanced, moderate diffuse wall thickening with surrounding fat stranding of the affected bowel loops, image 37/4. The mid and distal small bowel loops are within normal limits without evidence for wall thickening, inflammation or distension. No pathologic dilatation of the colon. Moderate gaseous distension of the rectum noted. Vascular/Lymphatic: Aortic atherosclerosis. Several prominent enhancing lymph nodes are noted within the upper abdomen adjacent to inflamed loop of bowel measuring up to 1 cm, image 34/4 and image 35/4. No retroperitoneal adenopathy. No pelvic or inguinal adenopathy. Reproductive: Uterus and bilateral adnexa are unremarkable. Other: No free fluid or fluid collections. Musculoskeletal: Multilevel degenerative disc disease identified within the lumbar spine. No acute or suspicious osseous findings. IMPRESSION: 1. Status post Whipple procedure. Examination is positive for acute inflammation/enteritis involving a loop of small bowel within the right upper quadrant of the abdomen just distal to the choledochojejunostomy. There are several prominent adjacent mesenteric lymph nodes which in the acute setting may be reactive in etiology. Attention in these lymph nodes on follow-up imaging is advised in this patient that may be at risk for recurrent neoplasm. 2. Signs of chronic pancreatitis involving the remaining body and tail of pancreas. 3. Geographic area of low attenuation within the lateral segment of left hepatic lobe  spanning the falciform ligament is favored to represent focal fatty deposition. 4. Aortic atherosclerosis. Aortic Atherosclerosis (ICD10-I70.0). Electronically Signed   By: Kerby Moors M.D.   On: 03/09/2020 15:21   DG Chest Portable 1 View  Result Date: 03/09/2020 CLINICAL DATA:  Nausea, vomiting, diarrhea for the past month. EXAM: PORTABLE CHEST 1 VIEW COMPARISON:  Chest x-ray dated December 09, 2019. FINDINGS: The heart size and mediastinal contours are within normal limits. Both lungs are clear. The visualized skeletal structures are unremarkable. IMPRESSION: No active disease. Electronically Signed   By: Titus Dubin M.D.   On: 03/09/2020 14:06    Procedures Procedures (including critical care time)  Medications Ordered in ED Medications  fentaNYL (SUBLIMAZE) injection 50 mcg (has no administration in time range)  amoxicillin-clavulanate (AUGMENTIN) 875-125 MG per tablet 1 tablet (has no administration in time range)  sodium chloride 0.9 % bolus 1,000 mL (0 mLs Intravenous Stopped 03/09/20 1247)  ondansetron (ZOFRAN) injection 4 mg (4 mg Intravenous Given 03/09/20 1110)  fentaNYL (SUBLIMAZE) injection 100 mcg (100 mcg Intravenous Given 03/09/20 1110)  alum & mag hydroxide-simeth (MAALOX/MYLANTA) 200-200-20 MG/5ML suspension 30 mL (30 mLs Oral Given 03/09/20 1209)    And  lidocaine (XYLOCAINE) 2 % viscous mouth solution 15 mL (15 mLs Oral Given 03/09/20 1209)  iohexol (OMNIPAQUE) 300 MG/ML solution 100 mL (100 mLs Intravenous Contrast Given 03/09/20 1458)    ED Course  I have reviewed the triage vital signs and the nursing notes.  Pertinent labs & imaging results that were available during my care of the patient were reviewed by me and considered in my medical decision making (see chart for details).  Unfortunately prolonged wait time in the waiting room of greater than 14 hours.  79 year old presents for evaluation of abd pain, N/V. Hx of duodenal ulcers. Recently treated with Tramadol  at PCP office however was taking more than prescribed and ran out of prescription earlier than expected. PCP declined for refill. NBNB emesis. Non melanotic or BR blood stools. No CP, SOB. No clinical evidence of DVT on exam to suggest atypical cardiac or pulmonary etiology of symptoms. No midline pulsatile mass to suggest AAA, dissection. pLand on labs, imaging and reassess. Went to [facility] yesterday and LWBS. Labs reviewed with mild increase in creatinine otherwise lipase and CBC WNL.  Labs and imaging personally reviewed and interpreted:  Clinical Course as of Mar 09 1536  Wed Mar 09, 2020  1506 CC: epigastric abd pain, nausea, vomiting. Vomited 2 days ago. S/p whipple. Possible med seeking. PCP rx tramadol for chronic abdominal pain, finished prescriptions early.  Plan to follow up with CTAP. Anticipate discharge with GI f/u.  Patient called PCP for refill of tramadol early but they declined, not appropriate taking tramadol.    [CG]  1507 Lipase: 15 [CG]  1507 WBC: 8.9 [CG]  1507 AST: 26 [CG]  1507 ALT: 19 [CG]  1507 Alkaline Phosphatase: 77 [CG]  1507 Total Bilirubin: 0.6 [CG]  1507 Bilirubin Urine: NEGATIVE [CG]  1508 No melena   Hemoglobin(!): 11.9 [CG]  1514 Troponin I (High Sensitivity) [BH]  1520 79 yo female presenting with chronic lower abdominal pain, ongoing for many months.  She has seen Gi specialists and been admitted to the hospital for this pain and reports "no one can figure it out."  She is on tramadol chronically and ran out yesterday as she was taking more than advised.  She presents today telling PA provider "I need to be in the hospital  for fentanyl."  She reports nausea, intermittent vomiting at home (chronic, not acute), no diarrhea or constipation.  On exam she has only mild LLQ and RLQ abdominal tenderness, no rigidity or rebound.  She appears quite comfortable to me.  Her labs who CMP near baseline levels, no leukocytosis, negative UA for signs of infection, trop 7.   Doubtful of acute intraabdominal infection, surgical emergency, or ACS.  Pending CT study.  If negative we'll discharge her home with a 3 day supply of tramadol and zofran, advised to f/u with PCP and GI doctor.  Patient verbalized understanding of this plan.   [MT]  1522 Signed out to afternoon EDP team with plan to f/u on CT scan, discharge home if no acute findings.  Tramadol and zofran scripts sent to her pharmacy.   [MT]    Clinical Course User Index [BH] Alianny Toelle A, PA-C [CG] Kinnie Feil, PA-C [MT] Trifan, Carola Rhine, MD   Patient reassessed. No emesis here in ED. Pain controlled. Plan to reassess after CT imaging.  CT scan with enteritis.  Does have chronic pancreatitis however think likely enteritis as source of her pain.  Will DC home with antibiotics, pain management, Zofran.  She is unable to tolerate p.o. intake here in the ED.  Patient seen and evaluated by attending Dr. Langston Masker who agrees with above treatment, plan and disposition.  The patient has been appropriately medically screened and/or stabilized in the ED. I have low suspicion for any other emergent medical condition which would require further screening, evaluation or treatment in the ED or require inpatient management.  Patient is hemodynamically stable and in no acute distress.  Patient able to ambulate in department prior to ED.  Evaluation does not show acute pathology that would require ongoing or additional emergent interventions while in the emergency department or further inpatient treatment.  I have discussed the diagnosis with the patient and answered all questions.  Pain is been managed while in the emergency department and patient has no further complaints prior to discharge.  Patient is comfortable with plan discussed in room and is stable for discharge at this time.  I have discussed strict return precautions for returning to the emergency department.  Patient was encouraged to follow-up with  PCP/specialist refer to at discharge.   MDM Rules/Calculators/A&P                           Final Clinical Impression(s) / ED Diagnoses Final diagnoses:  Epigastric pain  Enteritis    Rx / DC Orders ED Discharge Orders         Ordered    amoxicillin-clavulanate (AUGMENTIN) 875-125 MG tablet  Every 12 hours     Discontinue  Reprint     03/09/20 1531    traMADol (ULTRAM) 50 MG tablet  Every 8 hours PRN     Discontinue  Reprint     03/09/20 1519    ondansetron (ZOFRAN ODT) 4 MG disintegrating tablet  Every 8 hours PRN     Discontinue  Reprint     03/09/20 1519           Nikkolas Coomes A, PA-C 03/09/20 1518    Jennett Tarbell A, PA-C 03/09/20 1538    Wyvonnia Dusky, MD 03/09/20 1811

## 2020-03-09 NOTE — ED Notes (Signed)
Patient verbalizes understanding of discharge instructions. Opportunity for questioning and answers were provided. Armband removed by staff, pt discharged from ED ambulatory.   

## 2020-03-09 NOTE — Discharge Instructions (Addendum)
You have inflammation around a small part of your bowels called an Enteritis or Colitis.  This is usually caused by a virus but can also be caused by bacteria.  We will treat this with 7 days of augmentin, an antibiotic. Try to stick to a fluid and soft food diet for the next 7 days.

## 2020-03-10 ENCOUNTER — Ambulatory Visit (HOSPITAL_COMMUNITY): Admission: RE | Admit: 2020-03-10 | Payer: Medicare HMO | Source: Ambulatory Visit

## 2020-03-16 DIAGNOSIS — K8689 Other specified diseases of pancreas: Secondary | ICD-10-CM | POA: Diagnosis not present

## 2020-03-16 DIAGNOSIS — K861 Other chronic pancreatitis: Secondary | ICD-10-CM | POA: Diagnosis not present

## 2020-03-17 DIAGNOSIS — H40021 Open angle with borderline findings, high risk, right eye: Secondary | ICD-10-CM | POA: Diagnosis not present

## 2020-03-17 DIAGNOSIS — H02822 Cysts of right lower eyelid: Secondary | ICD-10-CM | POA: Diagnosis not present

## 2020-03-17 DIAGNOSIS — H401121 Primary open-angle glaucoma, left eye, mild stage: Secondary | ICD-10-CM | POA: Diagnosis not present

## 2020-03-22 ENCOUNTER — Telehealth: Payer: Self-pay

## 2020-03-22 NOTE — Telephone Encounter (Signed)
Pt called and requested to have a med sent in to increase he appetite. Please advise [facility]

## 2020-03-23 NOTE — Telephone Encounter (Signed)
Pt was advised and will try the ensure and advise of how she is doing. [place]

## 2020-03-23 NOTE — Telephone Encounter (Signed)
Have her add things like Ensure to her diet.  Have her set up a virtual visit if she wants to talk about something more than that

## 2020-03-30 ENCOUNTER — Other Ambulatory Visit: Payer: Self-pay | Admitting: Rheumatology

## 2020-03-30 DIAGNOSIS — M0600 Rheumatoid arthritis without rheumatoid factor, unspecified site: Secondary | ICD-10-CM

## 2020-03-30 NOTE — Telephone Encounter (Signed)
Last Visit:01/07/2020 Next Visit:06/09/2020 Labs: 01/07/2020 Potassium is elevated-5.8. Rest of lab work is stable. PLQ Eye Exam: 09/17/19 WNL   Current Dose per office note on 01/07/2020: Plaquenil 200 mg1 tablet by mouth daily  Okay to refill per Dr. Estanislado Pandy

## 2020-03-31 DIAGNOSIS — K861 Other chronic pancreatitis: Secondary | ICD-10-CM | POA: Diagnosis not present

## 2020-04-12 ENCOUNTER — Other Ambulatory Visit: Payer: Self-pay | Admitting: Family Medicine

## 2020-04-12 DIAGNOSIS — E119 Type 2 diabetes mellitus without complications: Secondary | ICD-10-CM

## 2020-04-12 DIAGNOSIS — I152 Hypertension secondary to endocrine disorders: Secondary | ICD-10-CM

## 2020-04-12 DIAGNOSIS — E1159 Type 2 diabetes mellitus with other circulatory complications: Secondary | ICD-10-CM

## 2020-04-12 DIAGNOSIS — E785 Hyperlipidemia, unspecified: Secondary | ICD-10-CM

## 2020-04-12 DIAGNOSIS — E1169 Type 2 diabetes mellitus with other specified complication: Secondary | ICD-10-CM

## 2020-04-15 ENCOUNTER — Encounter: Payer: Self-pay | Admitting: Medical

## 2020-04-15 ENCOUNTER — Telehealth: Payer: Self-pay

## 2020-04-15 ENCOUNTER — Telehealth (INDEPENDENT_AMBULATORY_CARE_PROVIDER_SITE_OTHER): Payer: Medicare HMO | Admitting: Medical

## 2020-04-15 ENCOUNTER — Telehealth: Payer: Self-pay | Admitting: Physician Assistant

## 2020-04-15 ENCOUNTER — Other Ambulatory Visit: Payer: Self-pay

## 2020-04-15 ENCOUNTER — Other Ambulatory Visit: Payer: Self-pay | Admitting: Rheumatology

## 2020-04-15 VITALS — Temp 97.6°F | Ht 62.75 in | Wt 86.0 lb

## 2020-04-15 DIAGNOSIS — E039 Hypothyroidism, unspecified: Secondary | ICD-10-CM | POA: Diagnosis not present

## 2020-04-15 DIAGNOSIS — M0600 Rheumatoid arthritis without rheumatoid factor, unspecified site: Secondary | ICD-10-CM

## 2020-04-15 DIAGNOSIS — J988 Other specified respiratory disorders: Secondary | ICD-10-CM | POA: Diagnosis not present

## 2020-04-15 DIAGNOSIS — E118 Type 2 diabetes mellitus with unspecified complications: Secondary | ICD-10-CM

## 2020-04-15 DIAGNOSIS — R05 Cough: Secondary | ICD-10-CM | POA: Diagnosis not present

## 2020-04-15 DIAGNOSIS — K8681 Exocrine pancreatic insufficiency: Secondary | ICD-10-CM

## 2020-04-15 DIAGNOSIS — R634 Abnormal weight loss: Secondary | ICD-10-CM

## 2020-04-15 DIAGNOSIS — R059 Cough, unspecified: Secondary | ICD-10-CM | POA: Insufficient documentation

## 2020-04-15 MED ORDER — AMOXICILLIN-POT CLAVULANATE 875-125 MG PO TABS
1.0000 | ORAL_TABLET | Freq: Two times a day (BID) | ORAL | 0 refills | Status: DC
Start: 2020-04-15 — End: 2020-06-09

## 2020-04-15 MED ORDER — BD SWABS SINGLE USE BUTTERFLY PADS: 1.0000 | MEDICATED_PAD | Freq: Two times a day (BID) | 3 refills | Status: AC

## 2020-04-15 NOTE — Telephone Encounter (Signed)
Done KH 

## 2020-04-15 NOTE — Telephone Encounter (Signed)
Patient called stating she moved and her [place].

## 2020-04-15 NOTE — Telephone Encounter (Signed)
Received fax from [facility] for a refill on the pts. BD single USE SWAB pt. Last apt was 02/15/20.

## 2020-04-15 NOTE — Telephone Encounter (Signed)
Last Visit:01/07/2020 Next Visit:06/09/2020 Labs:03/09/2020 Hgb 11.9, CO2 19, BUN 30, Creat. 1.02, GFR 53,Calcium 8.8, Albumin 3.4  Current Dose per office note on 01/07/2020: Arava 20 mg by mouth daily DX: Seronegative rheumatoid arthritis   Okay to refill Arava?

## 2020-04-15 NOTE — Progress Notes (Signed)
Subjective:     Patient ID: Deborah Travis, female   DOB: 1941-03-02, 79 y.o.   MRN: [id number]  This visit type was conducted due to national recommendations for restrictions regarding the COVID-19 Pandemic (e.g. social distancing) in an effort to limit this patient's exposure and mitigate transmission in our community.  Due to their co-morbid illnesses, this patient is at least at moderate risk for complications without adequate follow up.  This format is felt to be most appropriate for this patient at this time.    Documentation for virtual audio and video telecommunications through [place] encounter:  The patient was located at home. The provider was located in the office. The patient did consent to this visit and is aware of possible charges through their insurance for this visit.  The other persons participating in this telemedicine service were none. Time spent on call was 20 minutes and in review of previous records 20 minutes total.  This virtual service is not related to other E/M service within previous 7 days.   HPI Chief Complaint  Patient presents with  . Sinus Problem    chest congestioin, cough, green phlegm, feels bad    Virtual consult for illness.   First started feeling bad 4 days ago.  Caught this from her granddaughter.    Lives with her daughter and family, but got this from them.   Coughing a lot, hard to get mucous up.  When she gets it up its green and thick.   Feeling bad.  No fever.  No body aches, no chills, no NVD.   Some SOB.  Weak.  No hemoptysis.  No sore throat.  Has had some sinus pressure and headache.  Using ricolo cough drops.  Wants script for Augmentin which works well for her.    Had loss of taste since 06/2018.     Has had both covid vaccine doses.    Is underweight, s/p whipple procedure due to chronic pancreatitis.  trying to eat the right stuff but can't gain weight.  Using pancrease enzymes per specialist.  Thyroid medication was  modified last visit here.    No other aggravating or relieving factors.  No other complaint.  Past Medical History:  Diagnosis Date  . Chronic kidney disease    RENAL STONE  . Chronic pancreatitis ([place])   . Diabetes mellitus   . Duodenal ulcer   . Dyslipidemia   . GERD (gastroesophageal reflux disease)   . Hypertension   . Osteopenia   . Thyroid disease    HYPOTHYROID  . Vitamin D deficiency     Current Outpatient Medications on File Prior to Visit  Medication Sig Dispense Refill  . Accu-Chek FastClix Lancets MISC 1 Device by Does not apply route 2 (two) times daily. 15 each 3  . Alcohol Swabs (B-D SINGLE USE SWABS BUTTERFLY) PADS 1 each by Does not apply route 2 (two) times daily. 300 each 3  . b complex vitamins capsule Take 1 capsule by mouth daily.    . Blood Glucose Calibration (ACCU-CHEK AVIVA) SOLN Use as directed 3 each 0  . Blood Glucose Monitoring Suppl (ACCU-CHEK AVIVA PLUS) w/Device KIT Use as directed Patient is to test BID DX E11.9 1 kit 0  . cholecalciferol (VITAMIN D3) 25 MCG (1000 UT) tablet Take 1,000 Units by mouth 2 (two) times a day.    . Coenzyme Q10 (CO Q 10) 10 MG CAPS Take 10 mg by mouth daily.     Marland Kitchen glucose blood  test strip THIS IS FOR Accucheck Aviva Plus Use as instructed PATIENT IS TO TEST ONE TIME A DAY DX: E11.9 100 each 3  . hydroxychloroquine (PLAQUENIL) 200 MG tablet TAKE 1 TABLET BY MOUTH EVERY DAY. DX:M06.00 30 tablet 1  . levothyroxine (SYNTHROID) 112 MCG tablet Take 1 tablet (112 mcg total) by mouth daily. 90 tablet 3  . lisinopril (ZESTRIL) 10 MG tablet TAKE 1 TABLET EVERY DAY 90 tablet 0  . Menthol (RICOLA) LOZG Use as directed 1 each in the mouth or throat as needed.    . metFORMIN (GLUCOPHAGE) 1000 MG tablet TAKE 1 TABLET TWICE DAILY 180 tablet 1  . Multiple Vitamin (MULITIVITAMIN WITH MINERALS) TABS Take 1 tablet by mouth daily.    . pravastatin (PRAVACHOL) 20 MG tablet TAKE 1 TABLET EVERY DAY 90 tablet 0  . VITAMIN E COMPLEX PO Take 1  capsule by mouth daily.     Marland Kitchen ZENPEP 25000-79000 units CPEP Take 1 tablet by mouth in the morning, at noon, and at bedtime.     . Zinc Sulfate (ZINC 15 PO) Take 15 mg by mouth daily.    Marland Kitchen amoxicillin-clavulanate (AUGMENTIN) 875-125 MG tablet Take 1 tablet by mouth 2 (two) times daily. (Patient not taking: Reported on 02/15/2020) 20 tablet 0  . ondansetron (ZOFRAN ODT) 4 MG disintegrating tablet Take 1 tablet (4 mg total) by mouth every 8 (eight) hours as needed for up to 15 doses for nausea or vomiting. (Patient not taking: Reported on 04/15/2020) 15 tablet 0  . traMADol (ULTRAM) 50 MG tablet Take 1 tablet (50 mg total) by mouth every 6 (six) hours as needed. (Patient not taking: Reported on 03/09/2020) 50 tablet 0   No current facility-administered medications on file prior to visit.    Review of Systems As in subjective      Objective:   Physical Exam Due to coronavirus pandemic stay at home measures, patient visit was virtual and they were not examined in person.   Temp 97.6 F (36.4 C)   Ht 5' 2.75" (1.594 m)   Wt 86 lb (39 kg)   LMP  (LMP Unknown)   BMI 15.36 kg/m   Wt Readings from Last 3 Encounters:  04/15/20 86 lb (39 kg)  03/08/20 94 lb (42.6 kg)  02/15/20 100 lb (45.4 kg)       Assessment:     Encounter Diagnoses  Name Primary?  . Cough Yes  . Respiratory tract infection   . Exocrine pancreatic insufficiency   . Type 2 diabetes with complication ([place])   . Hypothyroidism, unspecified type   . Weight loss        Plan:     We discussed her symptoms and concerns.  I advised that cannot completely rule out Covid and we discussed limitations of virtual consult.  Begin Augmentin empirically given her history of similar and current concerns for head and chest infection.  I advise she got get a Covid test since there are other sick people in her household.  If positive she would be eligible possibly for the infusion therapy.  I gave her the hotline number for that since  this is Friday afternoon and will be closed by the time she got her test results.  If Covid negative continue treatment recommendations at rest, hydration, Augmentin, Mucinex DM over-the-counter, Tylenol as needed.  We discussed self quarantine.  She has a follow-up appointment here in about 2 weeks with her PCP.  I advise she follow-up to discuss her  weight loss, the thyroid blood test and medication since her dose was adjusted last visit, as well as discussed any other possibilities of weight loss causes.  I reviewed her last visit notes in the chart record for both PCP here and GI with Dr. Dell Ponto regarding chronic pancreatitis, status post Whipple surgery.   If much worse over the weekend then go to the emergency dept.  Deborah Travis was seen today for sinus problem.  Diagnoses and all orders for this visit:  Cough  Respiratory tract infection  Exocrine pancreatic insufficiency  Type 2 diabetes with complication (HCC)  Hypothyroidism, unspecified type  Weight loss  Other orders -     amoxicillin-clavulanate (AUGMENTIN) 875-125 MG tablet; Take 1 tablet by mouth 2 (two) times daily.  f/u in 2 wk as planned here with Dr. Redmond School

## 2020-04-15 NOTE — Telephone Encounter (Signed)
Contacted patient and advised we got the information about her new pharmacy. Patient does not need any refills at this time.

## 2020-05-10 ENCOUNTER — Other Ambulatory Visit: Payer: Self-pay

## 2020-05-10 ENCOUNTER — Other Ambulatory Visit: Payer: Self-pay | Admitting: *Deleted

## 2020-05-10 ENCOUNTER — Ambulatory Visit (INDEPENDENT_AMBULATORY_CARE_PROVIDER_SITE_OTHER): Payer: Medicare HMO | Admitting: Family Medicine

## 2020-05-10 ENCOUNTER — Encounter: Payer: Self-pay | Admitting: Family Medicine

## 2020-05-10 VITALS — BP 98/56 | HR 78 | Temp 97.8°F | Ht 64.0 in | Wt 89.6 lb

## 2020-05-10 DIAGNOSIS — I7090 Unspecified atherosclerosis: Secondary | ICD-10-CM | POA: Diagnosis not present

## 2020-05-10 DIAGNOSIS — Z8711 Personal history of peptic ulcer disease: Secondary | ICD-10-CM

## 2020-05-10 DIAGNOSIS — M8589 Other specified disorders of bone density and structure, multiple sites: Secondary | ICD-10-CM

## 2020-05-10 DIAGNOSIS — M0600 Rheumatoid arthritis without rheumatoid factor, unspecified site: Secondary | ICD-10-CM

## 2020-05-10 DIAGNOSIS — Z23 Encounter for immunization: Secondary | ICD-10-CM

## 2020-05-10 DIAGNOSIS — Z8719 Personal history of other diseases of the digestive system: Secondary | ICD-10-CM | POA: Diagnosis not present

## 2020-05-10 DIAGNOSIS — R10819 Abdominal tenderness, unspecified site: Secondary | ICD-10-CM

## 2020-05-10 DIAGNOSIS — E785 Hyperlipidemia, unspecified: Secondary | ICD-10-CM | POA: Diagnosis not present

## 2020-05-10 DIAGNOSIS — I152 Hypertension secondary to endocrine disorders: Secondary | ICD-10-CM

## 2020-05-10 DIAGNOSIS — K861 Other chronic pancreatitis: Secondary | ICD-10-CM

## 2020-05-10 DIAGNOSIS — Z0000 Encounter for general adult medical examination without abnormal findings: Secondary | ICD-10-CM

## 2020-05-10 DIAGNOSIS — E118 Type 2 diabetes mellitus with unspecified complications: Secondary | ICD-10-CM | POA: Diagnosis not present

## 2020-05-10 DIAGNOSIS — H35039 Hypertensive retinopathy, unspecified eye: Secondary | ICD-10-CM

## 2020-05-10 DIAGNOSIS — H409 Unspecified glaucoma: Secondary | ICD-10-CM

## 2020-05-10 DIAGNOSIS — I10 Essential (primary) hypertension: Secondary | ICD-10-CM

## 2020-05-10 DIAGNOSIS — D692 Other nonthrombocytopenic purpura: Secondary | ICD-10-CM | POA: Diagnosis not present

## 2020-05-10 DIAGNOSIS — E1169 Type 2 diabetes mellitus with other specified complication: Secondary | ICD-10-CM | POA: Diagnosis not present

## 2020-05-10 DIAGNOSIS — E1159 Type 2 diabetes mellitus with other circulatory complications: Secondary | ICD-10-CM

## 2020-05-10 DIAGNOSIS — K8681 Exocrine pancreatic insufficiency: Secondary | ICD-10-CM

## 2020-05-10 DIAGNOSIS — E039 Hypothyroidism, unspecified: Secondary | ICD-10-CM | POA: Diagnosis not present

## 2020-05-10 DIAGNOSIS — R634 Abnormal weight loss: Secondary | ICD-10-CM

## 2020-05-10 LAB — POCT URINALYSIS DIP (PROADVANTAGE DEVICE)
Blood, UA: NEGATIVE
Glucose, UA: NEGATIVE mg/dL
Ketones, POC UA: NEGATIVE mg/dL
Leukocytes, UA: NEGATIVE
Nitrite, UA: NEGATIVE
Specific Gravity, Urine: 1.03
Urobilinogen, Ur: 0.2
pH, UA: 5.5 (ref 5.0–8.0)

## 2020-05-10 LAB — POCT GLYCOSYLATED HEMOGLOBIN (HGB A1C): Hemoglobin A1C: 5.7 % — AB (ref 4.0–5.6)

## 2020-05-10 MED ORDER — TRAMADOL HCL 50 MG PO TABS
100.0000 mg | ORAL_TABLET | Freq: Four times a day (QID) | ORAL | 0 refills | Status: DC | PRN
Start: 2020-05-10 — End: 2020-06-09

## 2020-05-10 MED ORDER — HYDROXYCHLOROQUINE SULFATE 200 MG PO TABS: ORAL_TABLET | ORAL | 0 refills | Status: DC

## 2020-05-10 NOTE — Telephone Encounter (Signed)
Patient states she would like the prescription sent to [facility].   Last Visit:01/07/2020 Next Visit:06/09/2020 Labs: 7/13/2021WBC 10.9, Creat. 1.27, GFR 41, Glucose 148, CO2 21, BUN 46, Total Protein 8.6 PLQ Eye Exam: 09/17/19 WNL  Current Dose per office note on 01/07/2020:Plaquenil 200 mg1 tablet by mouth daily  Okay to refill PLQ?

## 2020-05-10 NOTE — Telephone Encounter (Signed)
Received prescription refill form [facility]. Patient last had her prescription filled at [facility] [address]. Attempted to contact the patient to verify which pharmacy she would like to fill prescription at.

## 2020-05-10 NOTE — Patient Instructions (Signed)
  [name] , Thank you for taking time to come for your Medicare Wellness Visit. I appreciate your ongoing commitment to your health goals. Please review the following plan we discussed and let me know if I can assist you in the future.   These are the goals we discussed: Take half of the lisinopril daily when you run out and call me and remind me that I said you were going to take half a pill This is a list of the screening recommended for you and due dates:  Health Maintenance  Topic Date Due  . COVID-19 Vaccine (1) Never done  . Complete foot exam   12/29/2019  . Flu Shot  03/27/2020  . Hemoglobin A1C  07/01/2020  . Eye exam for diabetics  09/16/2020  . Tetanus Vaccine  06/27/2021  . DEXA scan (bone density measurement)  Completed  .  Hepatitis C: One time screening is recommended by Center for Disease Control  (CDC) for  adults born from [birth year] through 1963.   Completed  . Pneumonia vaccines  Completed

## 2020-05-10 NOTE — Progress Notes (Addendum)
Deborah Travis is a 79 y.o. female who presents for annual wellness visit,CPE and follow-up on chronic medical conditions.  She has a history of exocrine pancreatic insufficiency and has been seeing Dr. Penelope Coop however he is retiring and she would like me to take over using GI cocktail as well as a Zenpep.  She does occasionally need tramadol for pain relief from this.  She does have a history of peptic ulcer and continues on a PPI for that.  She has a long history of difficulty with pancreatitis because of this.  This has affected her eating and she has been having trouble maintaining her weight.  She does have underlying diabetes and presently is taking Metformin.  She continues on pravastatin for cholesterol and having no difficulty with that.  She does take a small dose of lisinopril as well.  She continues to be followed by rheumatology for her underlying rheumatoid arthritis.  She also has a history of osteopenia with her last screening done in 2019.  She continues on her thyroid medication without trouble.  She sees the eye doctor regularly for her glaucoma.  Does have x-ray evidence of atherosclerosis.  Immunizations and Health Maintenance Immunization History  Administered Date(s) Administered  . Fluad Quad(high Dose 65+) 06/01/2019  . Influenza Split 05/21/2011, 06/12/2012, 07/15/2013  . Influenza, High Dose Seasonal PF 07/13/2013, 07/01/2014, 06/01/2015, 06/15/2016, 04/15/2017, 05/19/2018  . Pneumococcal Conjugate-13 06/15/2016  . Pneumococcal Polysaccharide-23 06/12/2012  . Tdap 06/28/2011  . Zoster 06/27/2013   Health Maintenance Due  Topic Date Due  . COVID-19 Vaccine (1) Never done  . FOOT EXAM  12/29/2019  . INFLUENZA VACCINE  03/27/2020    Last Pap smear: aged out Last mammogram: 05/14/13 Last colonoscopy: 10/01/18 Last DEXA: 02/18/18 Dentist: Q six months  Ophtho:Q six months Exercise: walking one hour daily  Other doctors caring for patient include:Ganam Deveshwar Advanced  directives: Does Patient Have a Medical Advance Directive?: Yes Type of Advance Directive: Living will, Out of facility DNR (pink MOST or yellow form)  Depression screen:  See questionnaire below.  Depression screen [facility] 2/9 05/10/2020 11/17/2018 05/19/2018 04/15/2017 02/10/2016  Decreased Interest 0 0 0 0 0  Down, Depressed, Hopeless 0 0 0 0 0  PHQ - 2 Score 0 0 0 0 0    Fall Risk Screen: see questionnaire below. Fall Risk  05/10/2020 11/17/2018 05/19/2018 04/15/2017 02/10/2016  Falls in the past year? 0 0 No No No    ADL screen:  See questionnaire below Functional Status Survey: Is the patient deaf or have difficulty hearing?: Yes (right ear) Does the patient have difficulty seeing, even when wearing glasses/contacts?: No Does the patient have difficulty concentrating, remembering, or making decisions?: No Does the patient have difficulty walking or climbing stairs?: No Does the patient have difficulty dressing or bathing?: No Does the patient have difficulty doing errands alone such as visiting a doctor's office or shopping?: No   Review of Systems Constitutional: -, -unexpected weight change, -anorexia, -fatigue Allergy: -sneezing, -itching, -congestion Dermatology: denies changing moles, rash, lumps ENT: -runny nose, -ear pain, -sore throat,  Cardiology:  -chest pain, -palpitations, -orthopnea, Respiratory: -cough, -shortness of breath, -dyspnea on exertion, -wheezing,  Gastroenterology: -abdominal pain, -nausea, -vomiting, -diarrhea, -constipation, -dysphagia Hematology: -bleeding  Musculoskeletal: -arthralgias, -myalgias, -joint swelling, -back pain, - Ophthalmology: -vision changes,  Urology: -dysuria, -difficulty urinating,  -urinary frequency, -urgency, incontinence Neurology: -, -numbness, , -memory loss, -falls, -dizziness    PHYSICAL EXAM:  LMP  (LMP Unknown)   General Appearance:  Alert, cooperative, no distress, appears stated age Head: Normocephalic, without obvious  abnormality, atraumatic Eyes: PERRL, conjunctiva/corneas clear, EOM's intact, Ears: Normal TM's and external ear canals Nose: Nares normal, mucosa normal, no drainage or sinus tenderness Throat: Lips, mucosa, and tongue normal; teeth and gums normal Neck: Supple, no lymphadenopathy;  thyroid:  no enlargement/tenderness/nodules; no carotid bruit or JVD Lungs: Clear to auscultation bilaterally without wheezes, rales or ronchi; respirations unlabored Heart: Regular rate and rhythm, S1 and S2 normal, no murmur, rubor gallop Abdomen: Soft, slightly tender, nondistended, normoactive bowel sounds,  no masses, no hepatosplenomegaly Extremities: No clubbing, cyanosis or edema Pulses: 2+ and symmetric all extremities Skin: Purpuric lesions noted on both forearms.   Lymph nodes: Cervical, supraclavicular, and axillary nodes normal Neurologic:  CNII-XII intact, normal strength, sensation and gait; reflexes 2+ and symmetric throughout Psych: Normal mood, affect, hygiene and grooming.  ASSESSMENT/PLAN: Exocrine pancreatic insufficiency  Type 2 diabetes with complication (HCC) - Plan: POCT glycosylated hemoglobin (Hb A1C), POCT Urinalysis DIP (Proadvantage Device)  Hypothyroidism, unspecified type - Plan: TSH  Seronegative rheumatoid arthritis ([place])  History of pancreatitis  Hypertensive retinopathy, unspecified laterality  Glaucoma of both eyes, unspecified glaucoma type  Atherosclerosis - Plan: Lipid panel  Senile purpura ([place])  Other chronic pancreatitis ([place]) - Plan: traMADol (ULTRAM) 50 MG tablet  Hypertension associated with diabetes ([place])  Hyperlipidemia associated with type 2 diabetes mellitus ([place]) - Plan: Lipid panel  Osteopenia of multiple sites - Plan: DG Bone Density  Weight loss  History of peptic ulcer disease  Routine general medical examination at a health care facility  Need for influenza vaccination - Plan: Flu Vaccine QUAD High Dose(Fluad)  Abdominal  tenderness, rebound tenderness presence not specified, unspecified location She will continue on her present medication regimen.  I will be taking over the GI cocktail as well as impact.  Did discuss use of tramadol and will need to be monitoring that closely.  Also discussed weight loss with her.  She is interested in medication however difficult to put her on this because of the possible side effects from medications like Marinol.  Did recommend she cut back on her Zestril to 1/2 pill/day.  She will continue on omeprazole and her other medications.  Follow-up with rheumatology and I will take over for GI for the present time but if I run in trouble and then refer her back.  She was comfortable with that.  No therapy needed for the senile purpura.      Immunization recommendations discussed.  Colonoscopy recommendations reviewed   Medicare Attestation I have personally reviewed: The patient's medical and social history Their use of alcohol, tobacco or illicit drugs Their current medications and supplements The patient's functional ability including ADLs,fall risks, home safety risks, cognitive, and hearing and visual impairment Diet and physical activities Evidence for depression or mood disorders  The patient's weight, height, and BMI have been recorded in the chart.  I have made referrals, counseling, and provided education to the patient based on review of the above and I have provided the patient with a written personalized care plan for preventive services.   9/17 The TSH is not at goal so will increase the dosing  Jill Alexanders, MD   05/10/2020

## 2020-05-10 NOTE — Addendum Note (Signed)
Addended by: Carole Binning on: 05/10/2020 10:43 AM   Modules accepted: Orders

## 2020-05-11 LAB — LIPID PANEL
Chol/HDL Ratio: 1.8 ratio (ref 0.0–4.4)
Cholesterol, Total: 114 mg/dL (ref 100–199)
HDL: 62 mg/dL (ref >39)
LDL Chol Calc (NIH): 32 mg/dL (ref 0–99)
Triglycerides: 110 mg/dL (ref 0–149)
VLDL Cholesterol Cal: 20 mg/dL (ref 5–40)

## 2020-05-11 LAB — TSH: TSH: 5.44 u[IU]/mL — ABNORMAL HIGH (ref 0.450–4.500)

## 2020-05-13 MED ORDER — LEVOTHYROXINE SODIUM 125 MCG PO TABS: 125.0000 ug | ORAL_TABLET | Freq: Every day | ORAL | 3 refills | Status: DC

## 2020-05-13 NOTE — Addendum Note (Signed)
Addended by: [name] on: 05/13/2020 07:06 AM   Modules accepted: Orders

## 2020-05-22 ENCOUNTER — Other Ambulatory Visit: Payer: Self-pay | Admitting: Rheumatology

## 2020-05-22 DIAGNOSIS — M0600 Rheumatoid arthritis without rheumatoid factor, unspecified site: Secondary | ICD-10-CM

## 2020-05-23 NOTE — Telephone Encounter (Signed)
Last Visit: 01/07/2020 Next Visit: 06/09/2020 Labs: 03/09/2020 CO2: 19, BUN: 30, Creatinine, Ser: 1.02, Calcium: 8.8, Albumin: 3.4, GFR: 53 Hemoglobin: 11.9 Eye exam: 09/17/2019 WNL  Current Dose per office note 01/07/2020: Plaquenil 200 mg 1 tablet by mouth daily DX: Seronegative rheumatoid arthritis   Okay to refill Plaquenil?

## 2020-05-26 NOTE — Progress Notes (Signed)
Office Visit Note  Patient: ELLISHA Travis             Date of Birth: Jan 17, 1941           MRN: [id number]             PCP: [name], MD Referring: [name], MD Visit Date: 06/09/2020 Occupation: @GUAROCC @  Subjective:  Medication Management   History of Present Illness: Deborah Travis is a 79 y.o. female with history of seronegative rheumatoid arthritis, osteoarthritis and osteopenia.  She states she has been taking Arava and Plaquenil on a regular basis.  She has not experienced any joint swelling.  She states the combination has been working very well for her.  She complains of decreased grip strength in her hands.  She states she has not had a repeat bone density as she is in the process of moving.  Activities of Daily Living:  Patient reports morning stiffness for 0 minutes.   Patient Denies nocturnal pain.  Difficulty dressing/grooming: Denies Difficulty climbing stairs: Denies Difficulty getting out of chair: Denies Difficulty using hands for taps, buttons, cutlery, and/or writing: Reports  Review of Systems  Constitutional: Negative for fatigue.  HENT: Negative for mouth sores, mouth dryness and nose dryness.   Eyes: Negative for pain, itching and dryness.  Respiratory: Negative for shortness of breath and difficulty breathing.   Cardiovascular: Negative for chest pain and palpitations.  Gastrointestinal: Negative for blood in stool, constipation and diarrhea.  Endocrine: Negative for increased urination.  Genitourinary: Negative for difficulty urinating and painful urination.  Musculoskeletal: Positive for muscle weakness. Negative for arthralgias, joint pain, joint swelling, myalgias, morning stiffness, muscle tenderness and myalgias.  Skin: Negative for color change, rash and redness.  Allergic/Immunologic: Positive for susceptible to infections.  Neurological: Negative for dizziness, numbness, headaches, memory loss and weakness.  Hematological:  Positive for bruising/bleeding tendency.  Psychiatric/Behavioral: Negative for confusion and sleep disturbance.    PMFS History:  Patient Active Problem List   Diagnosis Date Noted  . Weight loss 12/18/2019  . Abdominal tenderness 12/18/2019  . History of pancreatitis 01/20/2019  . Atherosclerosis 08/08/2018  . Glaucoma 05/26/2018  . Hypertensive retinopathy 05/26/2018  . Seronegative rheumatoid arthritis ([place]) 03/04/2018  . Senile purpura ([place]) 02/01/2015  . Former smoker 10/12/2013  . History of peptic ulcer disease 07/13/2013  . Osteopenia 05/07/2013  . Hypothyroidism 01/12/2012  . Type 2 diabetes with complication ([place]) 77/41/2878  . Hypertension associated with diabetes ([place]) 05/21/2011  . Exocrine pancreatic insufficiency 05/21/2011  . Hyperlipidemia associated with type 2 diabetes mellitus ([place]) 05/21/2011    Past Medical History:  Diagnosis Date  . Chronic kidney disease    RENAL STONE  . Chronic pancreatitis ([place])   . Diabetes mellitus   . Duodenal ulcer   . Dyslipidemia   . GERD (gastroesophageal reflux disease)   . Hypertension   . Osteopenia   . Thyroid disease    HYPOTHYROID  . Vitamin D deficiency     Family History  Problem Relation Age of Onset  . Stroke Mother   . Heart disease Father   . Healthy Daughter   . Healthy Daughter    Past Surgical History:  Procedure Laterality Date  . APPENDECTOMY    . CATARACT EXTRACTION Right 2014  . CHOLECYSTECTOMY    . TONSILECTOMY, ADENOIDECTOMY, BILATERAL MYRINGOTOMY AND TUBES    . [place]   Social History   Social History Narrative  . Not on file  Immunization History  Administered Date(s) Administered  . Fluad Quad(high Dose 65+) 06/01/2019, 05/10/2020  . Influenza Split 05/21/2011, 06/12/2012, 07/15/2013  . Influenza, High Dose Seasonal PF 07/13/2013, 07/01/2014, 06/01/2015, 06/15/2016, 04/15/2017, 05/19/2018  . PFIZER SARS-COV-2 Vaccination 09/18/2019, 10/09/2019  . Pneumococcal  Conjugate-13 06/15/2016  . Pneumococcal Polysaccharide-23 06/12/2012  . Tdap 06/28/2011  . Zoster 06/27/2013     Objective: Vital Signs: BP 93/62 (BP Location: Left Arm, Patient Position: Sitting, Cuff Size: Normal)   Pulse 73   Resp 13   Ht 5' 2.75" (1.594 m)   Wt 92 lb 6.4 oz (41.9 kg)   LMP  (LMP Unknown)   BMI 16.50 kg/m    Physical Exam Vitals and nursing note reviewed.  Constitutional:      Appearance: She is well-developed.  HENT:     Head: Normocephalic and atraumatic.  Eyes:     Conjunctiva/sclera: Conjunctivae normal.  Cardiovascular:     Rate and Rhythm: Normal rate and regular rhythm.     Heart sounds: Normal heart sounds.  Pulmonary:     Effort: Pulmonary effort is normal.     Breath sounds: Normal breath sounds.  Abdominal:     General: Bowel sounds are normal.     Palpations: Abdomen is soft.  Musculoskeletal:     Cervical back: Normal range of motion.  Lymphadenopathy:     Cervical: No cervical adenopathy.  Skin:    General: Skin is warm and dry.     Capillary Refill: Capillary refill takes less than 2 seconds.  Neurological:     Mental Status: She is alert and oriented to person, place, and time.  Psychiatric:        Behavior: Behavior normal.      Musculoskeletal Exam: C-spine was in good range of motion.  Shoulder joints, elbow joints, wrist joints with good range of motion.  She has synovial thickening but no synovitis over MCPs.  She has subluxation of her left CMC joint.  PIP and DIP thickening with no synovitis was noted.  Hip joints, knee joints, ankles, MTPs and PIPs with good range of motion with no synovitis.  CDAI Exam: CDAI Score: -- Patient Global: 1 mm; Provider Global: -- Swollen: 0 ; Tender: 0  Joint Exam 06/09/2020   No joint exam has been documented for this visit   There is currently no information documented on the homunculus. Go to the Rheumatology activity and complete the homunculus joint exam.  Investigation: No  additional findings.  Imaging: No results found.  Recent Labs: Lab Results  Component Value Date   WBC 8.9 03/09/2020   HGB 11.9 (L) 03/09/2020   PLT 322 03/09/2020   NA 136 03/09/2020   K 4.3 03/09/2020   CL 105 03/09/2020   CO2 19 (L) 03/09/2020   GLUCOSE 91 03/09/2020   BUN 30 (H) 03/09/2020   CREATININE 1.02 (H) 03/09/2020   BILITOT 0.6 03/09/2020   ALKPHOS 77 03/09/2020   AST 26 03/09/2020   ALT 19 03/09/2020   PROT 6.8 03/09/2020   ALBUMIN 3.4 (L) 03/09/2020   CALCIUM 8.8 (L) 03/09/2020   GFRAA >60 03/09/2020   QFTBGOLDPLUS NEGATIVE 03/30/2019    Speciality Comments: PLQ Eye Exam: 09/17/19 WNL @ [facility] Follow up in 6 months   Procedures:  No procedures performed Allergies: Levofloxacin and Codeine   Assessment / Plan:     Visit Diagnoses: Seronegative rheumatoid arthritis ([place]) - RF-,CCP-, 14-3-3 eta negative, Uric acid 4.8, ANA negative, no erosive changes on XR  of hands: She is clinically doing well with no synovitis.  High risk medication use - Arava 20 mg 1 tablet by mouth daily, Plaquenil 200 mg 1 tablet by mouth daily. PLQ Eye Exam: 09/17/19.  - Plan: CBC with Differential/Platelet, COMPLETE METABOLIC PANEL WITH GFR today and then every 3 months to monitor for drug toxicity.  She will get eye examination in January.  DDD (degenerative disc disease), cervical-she had good range of motion of her cervical spine.  Osteopenia of multiple sites - DEXA on 02/18/18: The BMD measured at [facility] is 0.700 g/cm2 with a T-score of -2.4.  Patient states that she will schedule DEXA herself she is in the process of  moving currently.  Vitamin D deficiency-she takes vitamin D supplement.  Hypertension associated with diabetes (HCC)-blood pressure is controlled and is on the lower limits.  History of hypothyroidism  History of gastroesophageal reflux (GERD)  History of chronic pancreatitis  Senile purpura ([place])  Hyperlipidemia associated with type 2  diabetes mellitus ([place])  History of peptic ulcer disease  Former smoker  Educated about COVID-19 virus infection-she is fully vaccinated against COVID-19.  Use of booster was discussed.  Use of mask, social distancing and hand hygiene was discussed.  She is also advised the use of monoclonal antibody in case she develops COVID-19 infection.  Orders: Orders Placed This Encounter  Procedures  . CBC with Differential/Platelet  . COMPLETE METABOLIC PANEL WITH GFR   No orders of the defined types were placed in this encounter.    Follow-Up Instructions: Return in about 5 months (around 11/07/2020) for Rheumatoid arthritis.   [name], MD  Note - This record has been created using Editor, commissioning.  Chart creation errors have been sought, but may not always  have been located. Such creation errors do not reflect on  the standard of medical care.

## 2020-06-06 ENCOUNTER — Ambulatory Visit: Payer: Medicare HMO | Admitting: Dietician

## 2020-06-09 ENCOUNTER — Other Ambulatory Visit: Payer: Self-pay

## 2020-06-09 ENCOUNTER — Encounter: Payer: Self-pay | Admitting: Rheumatology

## 2020-06-09 ENCOUNTER — Ambulatory Visit: Payer: Medicare HMO | Admitting: Rheumatology

## 2020-06-09 VITALS — BP 93/62 | HR 73 | Resp 13 | Ht 62.75 in | Wt 92.4 lb

## 2020-06-09 DIAGNOSIS — Z79899 Other long term (current) drug therapy: Secondary | ICD-10-CM | POA: Diagnosis not present

## 2020-06-09 DIAGNOSIS — M5030 Other cervical disc degeneration, unspecified cervical region: Secondary | ICD-10-CM | POA: Diagnosis not present

## 2020-06-09 DIAGNOSIS — D692 Other nonthrombocytopenic purpura: Secondary | ICD-10-CM | POA: Diagnosis not present

## 2020-06-09 DIAGNOSIS — Z8639 Personal history of other endocrine, nutritional and metabolic disease: Secondary | ICD-10-CM | POA: Diagnosis not present

## 2020-06-09 DIAGNOSIS — Z7189 Other specified counseling: Secondary | ICD-10-CM

## 2020-06-09 DIAGNOSIS — E785 Hyperlipidemia, unspecified: Secondary | ICD-10-CM

## 2020-06-09 DIAGNOSIS — Z8719 Personal history of other diseases of the digestive system: Secondary | ICD-10-CM | POA: Diagnosis not present

## 2020-06-09 DIAGNOSIS — Z87891 Personal history of nicotine dependence: Secondary | ICD-10-CM

## 2020-06-09 DIAGNOSIS — E1159 Type 2 diabetes mellitus with other circulatory complications: Secondary | ICD-10-CM | POA: Diagnosis not present

## 2020-06-09 DIAGNOSIS — I152 Hypertension secondary to endocrine disorders: Secondary | ICD-10-CM

## 2020-06-09 DIAGNOSIS — M8589 Other specified disorders of bone density and structure, multiple sites: Secondary | ICD-10-CM

## 2020-06-09 DIAGNOSIS — E559 Vitamin D deficiency, unspecified: Secondary | ICD-10-CM

## 2020-06-09 DIAGNOSIS — M0600 Rheumatoid arthritis without rheumatoid factor, unspecified site: Secondary | ICD-10-CM

## 2020-06-09 DIAGNOSIS — E1169 Type 2 diabetes mellitus with other specified complication: Secondary | ICD-10-CM

## 2020-06-09 DIAGNOSIS — Z8711 Personal history of peptic ulcer disease: Secondary | ICD-10-CM

## 2020-06-09 NOTE — Patient Instructions (Addendum)
COVID-19 vaccine recommendations:   COVID-19 vaccine is recommended for everyone (unless you are allergic to a vaccine component), even if you are on a medication that suppresses your immune system.   Do not take Tylenol or any anti-inflammatory medications (NSAIDs) 24 hours prior to the COVID-19 vaccination.   There is no direct evidence about the efficacy of the COVID-19 vaccine in individuals who are on medications that suppress the immune system.   Even if you are fully vaccinated, and you are on any medications that suppress your immune system, please continue to wear a mask, maintain at least six feet social distance and practice hand hygiene.   If you develop a COVID-19 infection, please contact your PCP or our office to determine if you need antibody infusion.  The booster vaccine is now available for immunocompromised patients. It is advised that if you had Pfizer vaccine you should get [employer] booster.  If you had a Moderna vaccine then you should get a Moderna booster. Johnson and Wynetta Emery does not have a booster vaccine at this time.  Please see the following web sites for updated information.   https://[email or web address].org/Portals/0/Files/COVID-19-Vaccination-Patient-Resources.pdf   Hand Exercises Hand exercises can be helpful for almost anyone. These exercises can strengthen the hands, improve flexibility and movement, and increase blood flow to the hands. These results can make work and daily tasks easier. Hand exercises can be especially helpful for people who have joint pain from arthritis or have nerve damage from overuse (carpal tunnel syndrome). These exercises can also help people who have injured a hand. Exercises Most of these hand exercises are gentle stretching and motion exercises. It is usually safe to do them often throughout the day. Warming up your hands before exercise may help to reduce stiffness. You can do this with gentle massage or by placing your hands in warm  water for 10-15 minutes. It is normal to feel some stretching, pulling, tightness, or mild discomfort as you begin new exercises. This will gradually improve. Stop an exercise right away if you feel sudden, severe pain or your pain gets worse. Ask your health care provider which exercises are best for you. Knuckle bend or "claw" fist 1. Stand or sit with your arm, hand, and all five fingers pointed straight up. Make sure to keep your wrist straight during the exercise. 2. Gently bend your fingers down toward your palm until the tips of your fingers are touching the top of your palm. Keep your big knuckle straight and just bend the small knuckles in your fingers. 3. Hold this position for __________ seconds. 4. Straighten (extend) your fingers back to the starting position. Repeat this exercise 5-10 times with each hand. Full finger fist 1. Stand or sit with your arm, hand, and all five fingers pointed straight up. Make sure to keep your wrist straight during the exercise. 2. Gently bend your fingers into your palm until the tips of your fingers are touching the middle of your palm. 3. Hold this position for __________ seconds. 4. Extend your fingers back to the starting position, stretching every joint fully. Repeat this exercise 5-10 times with each hand. Straight fist 1. Stand or sit with your arm, hand, and all five fingers pointed straight up. Make sure to keep your wrist straight during the exercise. 2. Gently bend your fingers at the big knuckle, where your fingers meet your hand, and the middle knuckle. Keep the knuckle at the tips of your fingers straight and try to touch the bottom of your  palm. 3. Hold this position for __________ seconds. 4. Extend your fingers back to the starting position, stretching every joint fully. Repeat this exercise 5-10 times with each hand. Tabletop 1. Stand or sit with your arm, hand, and all five fingers pointed straight up. Make sure to keep your wrist  straight during the exercise. 2. Gently bend your fingers at the big knuckle, where your fingers meet your hand, as far down as you can while keeping the small knuckles in your fingers straight. Think of forming a tabletop with your fingers. 3. Hold this position for __________ seconds. 4. Extend your fingers back to the starting position, stretching every joint fully. Repeat this exercise 5-10 times with each hand. Finger spread 1. Place your hand flat on a table with your palm facing down. Make sure your wrist stays straight as you do this exercise. 2. Spread your fingers and thumb apart from each other as far as you can until you feel a gentle stretch. Hold this position for __________ seconds. 3. Bring your fingers and thumb tight together again. Hold this position for __________ seconds. Repeat this exercise 5-10 times with each hand. Making circles 1. Stand or sit with your arm, hand, and all five fingers pointed straight up. Make sure to keep your wrist straight during the exercise. 2. Make a circle by touching the tip of your thumb to the tip of your index finger. 3. Hold for __________ seconds. Then open your hand wide. 4. Repeat this motion with your thumb and each finger on your hand. Repeat this exercise 5-10 times with each hand. Thumb motion 1. Sit with your forearm resting on a table and your wrist straight. Your thumb should be facing up toward the ceiling. Keep your fingers relaxed as you move your thumb. 2. Lift your thumb up as high as you can toward the ceiling. Hold for __________ seconds. 3. Bend your thumb across your palm as far as you can, reaching the tip of your thumb for the small finger (pinkie) side of your palm. Hold for __________ seconds. Repeat this exercise 5-10 times with each hand. Grip strengthening  1. Hold a stress ball or other soft ball in the middle of your hand. 2. Slowly increase the pressure, squeezing the ball as much as you can without causing  pain. Think of bringing the tips of your fingers into the middle of your palm. All of your finger joints should bend when doing this exercise. 3. Hold your squeeze for __________ seconds, then relax. Repeat this exercise 5-10 times with each hand. Contact a health care provider if:  Your hand pain or discomfort gets much worse when you do an exercise.  Your hand pain or discomfort does not improve within 2 hours after you exercise. If you have any of these problems, stop doing these exercises right away. Do not do them again unless your health care provider says that you can. Get help right away if:  You develop sudden, severe hand pain or swelling. If this happens, stop doing these exercises right away. Do not do them again unless your health care provider says that you can. This information is not intended to replace advice given to you by your health care provider. Make sure you discuss any questions you have with your health care provider. Document Revised: 12/04/2018 Document Reviewed: 08/14/2018 Elsevier Patient Education  [place] We placed an order today for your standing lab work.   Please have your standing  labs drawn in January and every 3 months  If possible, please have your labs drawn 2 weeks prior to your appointment so that the provider can discuss your results at your appointment.  We have open lab daily Monday through Thursday from 8:30-12:30 PM and 1:30-4:30 PM and Friday from 8:30-12:30 PM and 1:30-4:00 PM at the office of Dr. [name], [facility].   Please be advised, patients with office appointments requiring lab work will take precedents over walk-in lab work.  If possible, please come for your lab work on Monday and Friday afternoons, as you may experience shorter wait times. The office is located at [address], [place], [place], [place] [address] No appointment is necessary.   Labs are drawn by [facility]. Please  bring your co-pay at the time of your lab draw.  You may receive a bill from [facility] for your lab work.  If you wish to have your labs drawn at another location, please call the office 24 hours in advance to send orders.  If you have any questions regarding directions or hours of operation,  please call [phone].   As a reminder, please drink plenty of water prior to coming for your lab work. Thanks!

## 2020-06-10 ENCOUNTER — Other Ambulatory Visit: Payer: Self-pay | Admitting: Rheumatology

## 2020-06-10 ENCOUNTER — Other Ambulatory Visit: Payer: Self-pay | Admitting: Physician Assistant

## 2020-06-10 ENCOUNTER — Other Ambulatory Visit: Payer: Self-pay | Admitting: Family Medicine

## 2020-06-10 DIAGNOSIS — E1169 Type 2 diabetes mellitus with other specified complication: Secondary | ICD-10-CM

## 2020-06-10 DIAGNOSIS — E785 Hyperlipidemia, unspecified: Secondary | ICD-10-CM

## 2020-06-10 DIAGNOSIS — I152 Hypertension secondary to endocrine disorders: Secondary | ICD-10-CM

## 2020-06-10 DIAGNOSIS — M0600 Rheumatoid arthritis without rheumatoid factor, unspecified site: Secondary | ICD-10-CM

## 2020-06-10 DIAGNOSIS — E1159 Type 2 diabetes mellitus with other circulatory complications: Secondary | ICD-10-CM

## 2020-06-10 LAB — CBC WITH DIFFERENTIAL/PLATELET
Absolute Monocytes: 821 cells/uL (ref 200–950)
Basophils Absolute: 38 cells/uL (ref 0–200)
Basophils Relative: 0.5 %
Eosinophils Absolute: 213 cells/uL (ref 15–500)
Eosinophils Relative: 2.8 %
HCT: 32.2 % — ABNORMAL LOW (ref 35.0–45.0)
Hemoglobin: 10.6 g/dL — ABNORMAL LOW (ref 11.7–15.5)
Lymphs Abs: 2128 cells/uL (ref 850–3900)
MCH: 32.7 pg (ref 27.0–33.0)
MCHC: 32.9 g/dL (ref 32.0–36.0)
MCV: 99.4 fL (ref 80.0–100.0)
MPV: 10.2 fL (ref 7.5–12.5)
Monocytes Relative: 10.8 %
Neutro Abs: 4400 cells/uL (ref 1500–7800)
Neutrophils Relative %: 57.9 %
Platelets: 293 10*3/uL (ref 140–400)
RBC: 3.24 10*6/uL — ABNORMAL LOW (ref 3.80–5.10)
RDW: 13 % (ref 11.0–15.0)
Total Lymphocyte: 28 %
WBC: 7.6 10*3/uL (ref 3.8–10.8)

## 2020-06-10 LAB — COMPLETE METABOLIC PANEL WITH GFR
AG Ratio: 1.5 (calc) (ref 1.0–2.5)
ALT: 14 U/L (ref 6–29)
AST: 16 U/L (ref 10–35)
Albumin: 3.8 g/dL (ref 3.6–5.1)
Alkaline phosphatase (APISO): 63 U/L (ref 37–153)
BUN/Creatinine Ratio: 21 (calc) (ref 6–22)
BUN: 21 mg/dL (ref 7–25)
CO2: 24 mmol/L (ref 20–32)
Calcium: 9.3 mg/dL (ref 8.6–10.4)
Chloride: 105 mmol/L (ref 98–110)
Creat: 1.01 mg/dL — ABNORMAL HIGH (ref 0.60–0.93)
GFR, Est African American: 63 mL/min/{1.73_m2} (ref >=60)
GFR, Est Non African American: 54 mL/min/{1.73_m2} — ABNORMAL LOW (ref >=60)
Globulin: 2.6 g/dL (calc) (ref 1.9–3.7)
Glucose, Bld: 94 mg/dL (ref 65–99)
Potassium: 5.4 mmol/L — ABNORMAL HIGH (ref 3.5–5.3)
Sodium: 138 mmol/L (ref 135–146)
Total Bilirubin: 0.4 mg/dL (ref 0.2–1.2)
Total Protein: 6.4 g/dL (ref 6.1–8.1)

## 2020-06-10 NOTE — Progress Notes (Signed)
GFR is low and stable.  Anemia noted.

## 2020-06-10 NOTE — Telephone Encounter (Signed)
Last Visit: 06/10/2020 Next Visit: 11/08/2020 Labs: 06/09/2020 GFR is low and stable. Anemia noted.   Current Dose per office note on 06/10/2020: Arava 20 mg 1 tablet by mouth daily Dx: Seronegative rheumatoid arthritis   Okay to refill Arava?

## 2020-06-10 NOTE — Telephone Encounter (Signed)
Last Visit: 06/10/2020 Next Visit: 11/08/2020 Labs: 06/09/2020 GFR is low and stable. Anemia noted. PLQ Eye Exam: 09/17/19 WNL   Current Dose per office note on 06/10/2020: Plaquenil 200 mg 1 tablet by mouth daily.   Okay to refill PLQ?

## 2020-07-18 ENCOUNTER — Telehealth: Payer: Self-pay | Admitting: Family Medicine

## 2020-07-18 MED ORDER — ZENPEP 25000-79000 UNITS PO CPEP
1.0000 | ORAL_CAPSULE | Freq: Four times a day (QID) | ORAL | 0 refills | Status: DC
Start: 2020-07-18 — End: 2020-08-03

## 2020-07-18 NOTE — Telephone Encounter (Signed)
  Zen pep rx, she has moved and can not find rx She called [employer] and they will not let her refill her rx until 12/17 She wants to know if you can call in rx to get her thru until 12/17  She takes 3 with each meal and 1 with each snack   [facility] [place]  [address]

## 2020-08-03 ENCOUNTER — Telehealth: Payer: Self-pay | Admitting: Family Medicine

## 2020-08-03 MED ORDER — ZENPEP 25000-79000 UNITS PO CPEP
1.0000 | ORAL_CAPSULE | Freq: Four times a day (QID) | ORAL | 1 refills | Status: DC
Start: 2020-08-03 — End: 2020-08-17

## 2020-08-03 NOTE — Telephone Encounter (Signed)
Get me the last dictation from Dr. Penelope Coop to document dosing of that medication

## 2020-08-03 NOTE — Telephone Encounter (Signed)
Pt left voice mail  that she needs refill on zenpep   90 day supply, pt states she takes approximately 12 pills daily   She takes 3 before each meal and 1 with each snack   (originally rx by Penelope Coop who has retired) Pt states that you agreed to take over prescribing this medication   Needs sent to [facility]  Pt asked to be notified when rx sent to [facility]

## 2020-08-04 NOTE — Telephone Encounter (Signed)
LVM for medical records to send last ov note. [place]

## 2020-08-05 NOTE — Telephone Encounter (Signed)
LVM for the second time for this record. [place]

## 2020-08-09 NOTE — Telephone Encounter (Signed)
Hey I have called multiple times trying to get the last office notes for this pt. Please advised If you can help. Thanks [employer]

## 2020-08-12 ENCOUNTER — Other Ambulatory Visit: Payer: Self-pay

## 2020-08-12 ENCOUNTER — Ambulatory Visit (INDEPENDENT_AMBULATORY_CARE_PROVIDER_SITE_OTHER): Payer: Medicare HMO | Admitting: Family Medicine

## 2020-08-12 ENCOUNTER — Encounter: Payer: Self-pay | Admitting: Family Medicine

## 2020-08-12 VITALS — BP 120/78 | HR 84 | Temp 97.5°F | Wt 89.8 lb

## 2020-08-12 DIAGNOSIS — M0600 Rheumatoid arthritis without rheumatoid factor, unspecified site: Secondary | ICD-10-CM

## 2020-08-12 DIAGNOSIS — K861 Other chronic pancreatitis: Secondary | ICD-10-CM | POA: Diagnosis not present

## 2020-08-12 DIAGNOSIS — E1159 Type 2 diabetes mellitus with other circulatory complications: Secondary | ICD-10-CM

## 2020-08-12 DIAGNOSIS — E1169 Type 2 diabetes mellitus with other specified complication: Secondary | ICD-10-CM

## 2020-08-12 DIAGNOSIS — H35039 Hypertensive retinopathy, unspecified eye: Secondary | ICD-10-CM

## 2020-08-12 DIAGNOSIS — E118 Type 2 diabetes mellitus with unspecified complications: Secondary | ICD-10-CM | POA: Diagnosis not present

## 2020-08-12 DIAGNOSIS — K8681 Exocrine pancreatic insufficiency: Secondary | ICD-10-CM

## 2020-08-12 DIAGNOSIS — I152 Hypertension secondary to endocrine disorders: Secondary | ICD-10-CM | POA: Diagnosis not present

## 2020-08-12 DIAGNOSIS — E785 Hyperlipidemia, unspecified: Secondary | ICD-10-CM

## 2020-08-12 DIAGNOSIS — E039 Hypothyroidism, unspecified: Secondary | ICD-10-CM | POA: Diagnosis not present

## 2020-08-12 LAB — POCT GLYCOSYLATED HEMOGLOBIN (HGB A1C): Hemoglobin A1C: 6.1 % — AB (ref 4.0–5.6)

## 2020-08-12 MED ORDER — TRAMADOL HCL 50 MG PO TABS: 50.0000 mg | ORAL_TABLET | Freq: Three times a day (TID) | ORAL | 0 refills | Status: AC | PRN

## 2020-08-12 NOTE — Progress Notes (Signed)
°  Subjective:    Patient ID: Deborah Travis, female    DOB: Dec 29, 1940, 79 y.o.   MRN: [id number]  Deborah Travis is a 79 y.o. female who presents for follow-up of Type 2 diabetes mellitus.  Home blood sugar records: Not done Current symptoms/problems include none and have been unchanged. Daily foot checks:   Any foot concerns: no Exercise: The patient does not participate in regular exercise at present. Diet: Regular.  She does continue on Zenpep.  She also continues on hydroxy chloroquine, Arava, lisinopril, pravastatin and Synthroid.  She is having no difficulties with them. The following portions of the patient's history were reviewed and updated as appropriate: allergies, current medications, past medical history, past social history and problem list.  ROS as in subjective above.     Objective:    Physical Exam Alert and in no distress otherwise not examined. Hemoglobin A1c is 6.1 Lab Review Diabetic Labs Latest Ref Rng & Units 06/09/2020 05/10/2020 03/09/2020 03/08/2020 01/07/2020  HbA1c 4.0 - 5.6 % - 5.7(A) - - -  Microalbumin mg/L - - - - -  Micro/Creat Ratio - - - - - -  Chol 100 - 199 mg/dL - 114 - - -  HDL >39 mg/dL - 62 - - -  Calc LDL 0 - 99 mg/dL - 32 - - -  Triglycerides 0 - 149 mg/dL - 110 - - -  Creatinine 0.60 - 0.93 mg/dL 1.01(H) - 1.02(H) 1.27(H) 1.11(H)   BP/Weight 06/09/2020 05/10/2020 04/15/2020 03/09/2020 10/22/3333  Systolic BP 93 98 - 456 -  Diastolic BP 62 56 - 81 -  Wt. (Lbs) 92.4 89.6 86 - 94  BMI 16.5 15.38 15.36 - 16.78   Foot/eye exam completion dates Latest Ref Rng & Units 09/17/2019 12/29/2018  Eye Exam No Retinopathy No Retinopathy -  Foot Form Completion - - Done    [place]  reports that she quit smoking about 4 years ago. She smoked 0.10 packs per day. She has never used smokeless tobacco. She reports that she does not drink alcohol and does not use drugs.     Assessment & Plan:    Type 2 diabetes with complication (HCC) - Plan: POCT glycosylated  hemoglobin (Hb A1C)  Hypertension associated with diabetes ([place])  Exocrine pancreatic insufficiency - Plan: traMADol (ULTRAM) 50 MG tablet  Hyperlipidemia associated with type 2 diabetes mellitus (HCC)  Hypothyroidism, unspecified type  Hypertensive retinopathy, unspecified laterality  Chronic calcific pancreatitis ([place])    1. Rx changes: Decrease Metformin to 1 pill/day 2. Education: Reviewed ABCs of diabetes management (respective goals in parentheses):  A1C (<7), blood pressure (<130/80), and cholesterol (LDL <100). 3. Compliance at present is estimated to be good. Efforts to improve compliance (if necessary) will be directed at Continue with present diet regimen.. 4. Follow up: 5 months All things considered she is doing fairly well with somewhat maintaining her weight with her pancreatic insufficiency, being on Zenpep as well as her diabetes.  I think we can cut down on her Metformin and she is comfortable with that.

## 2020-08-17 ENCOUNTER — Telehealth: Payer: Self-pay | Admitting: Family Medicine

## 2020-08-17 MED ORDER — ZENPEP 25000-79000 UNITS PO CPEP: ORAL_CAPSULE | ORAL | 1 refills | Status: DC

## 2020-08-17 NOTE — Telephone Encounter (Signed)
Pt called and states that directions for Zenpep are incorrect and needs to be correct with [facility] Mail order pharmacy.  She takes 3 at Breakfast, 3 at lunch and 3 at supper. She also takes 1 with every snack and she has 3 snacks a day. Please sent correct rx to [facility] mail order pharm.

## 2020-09-05 ENCOUNTER — Other Ambulatory Visit: Payer: Self-pay | Admitting: Family Medicine

## 2020-09-05 ENCOUNTER — Other Ambulatory Visit: Payer: Self-pay | Admitting: Physician Assistant

## 2020-09-05 DIAGNOSIS — M0600 Rheumatoid arthritis without rheumatoid factor, unspecified site: Secondary | ICD-10-CM

## 2020-09-05 DIAGNOSIS — I152 Hypertension secondary to endocrine disorders: Secondary | ICD-10-CM

## 2020-09-05 DIAGNOSIS — E1159 Type 2 diabetes mellitus with other circulatory complications: Secondary | ICD-10-CM

## 2020-09-05 DIAGNOSIS — E1169 Type 2 diabetes mellitus with other specified complication: Secondary | ICD-10-CM

## 2020-09-06 NOTE — Telephone Encounter (Signed)
Please advise the patient to update lab work this week.  Ok to refill Arava.

## 2020-09-06 NOTE — Telephone Encounter (Signed)
LMOM for patient to come in for labs

## 2020-09-06 NOTE — Telephone Encounter (Signed)
Last Visit: 06/09/2020 Next Visit: 11/08/2020 Labs: 06/09/2020, GFR is low and stable. Anemia noted.  Current Dose per office note 06/09/2020, Arava 20 mg 1 tablet by mouth daily DX: Seronegative rheumatoid arthritis   Okay to refill ARAVA?

## 2020-09-19 DIAGNOSIS — E119 Type 2 diabetes mellitus without complications: Secondary | ICD-10-CM | POA: Diagnosis not present

## 2020-09-19 DIAGNOSIS — H401121 Primary open-angle glaucoma, left eye, mild stage: Secondary | ICD-10-CM | POA: Diagnosis not present

## 2020-09-19 DIAGNOSIS — H02822 Cysts of right lower eyelid: Secondary | ICD-10-CM | POA: Diagnosis not present

## 2020-09-19 DIAGNOSIS — M069 Rheumatoid arthritis, unspecified: Secondary | ICD-10-CM | POA: Diagnosis not present

## 2020-09-19 DIAGNOSIS — Z79899 Other long term (current) drug therapy: Secondary | ICD-10-CM | POA: Diagnosis not present

## 2020-09-19 DIAGNOSIS — H40021 Open angle with borderline findings, high risk, right eye: Secondary | ICD-10-CM | POA: Diagnosis not present

## 2020-09-19 DIAGNOSIS — H04212 Epiphora due to excess lacrimation, left lacrimal gland: Secondary | ICD-10-CM | POA: Diagnosis not present

## 2020-09-19 LAB — HM DIABETES EYE EXAM

## 2020-09-23 ENCOUNTER — Encounter: Payer: Self-pay | Admitting: Family Medicine

## 2020-10-25 NOTE — Progress Notes (Addendum)
Office Visit Note  Patient: Deborah Travis             Date of Birth: Jul 17, 1941           MRN: [id number]             PCP: [name], MD Referring: [name], MD Visit Date: 11/08/2020 Occupation: @GUAROCC @  Subjective:  Medication monitoring   History of Present Illness: Deborah Travis is a 80 y.o. female with history of seronegative rheumatoid arthritis and DDD.  She is taking arava 20 mg 1 tablet by mouth daily and plaquenil 200 mg 1 tablet by mouth daily.  She has not missed any doses recently.  She denies any recent rheumatoid arthritis flares.  She is not experiencing any joint pain or joint swelling at this time.  Her morning stiffness has been lasting 5 to 10 minutes but she has been stretching on a daily basis which has alleviated some of her symptoms.  She denies any nocturnal pain.  She states that she has noticed some decreased grip strength in both hands but has been using her clippers and an automatic can opener which has been helpful. She denies any recent infections. She has received 2 Pfizer COVID-19 vaccine doses but does not plan on receiving the booster.  Activities of Daily Living:  Patient reports morning stiffness for 5-10 minutes.   Patient Denies nocturnal pain.  Difficulty dressing/grooming: Denies Difficulty climbing stairs: Denies Difficulty getting out of chair: Denies Difficulty using hands for taps, buttons, cutlery, and/or writing: Reports  Review of Systems  Constitutional: Negative for fatigue.  HENT: Negative for mouth sores, mouth dryness and nose dryness.   Eyes: Negative for pain, itching and dryness.  Respiratory: Negative for shortness of breath and difficulty breathing.   Cardiovascular: Negative for chest pain and palpitations.  Gastrointestinal: Negative for blood in stool, constipation and diarrhea.  Endocrine: Negative for increased urination.  Genitourinary: Negative for difficulty urinating.  Musculoskeletal: Positive  for morning stiffness. Negative for arthralgias, joint pain, joint swelling, myalgias, muscle tenderness and myalgias.  Skin: Negative for color change, rash and redness.  Allergic/Immunologic: Negative for susceptible to infections.  Neurological: Negative for dizziness, numbness, headaches, memory loss and weakness.  Hematological: Positive for bruising/bleeding tendency.  Psychiatric/Behavioral: Negative for confusion.    PMFS History:  Patient Active Problem List   Diagnosis Date Noted  . Chronic calcific pancreatitis ([place]) 08/12/2020  . Weight loss 12/18/2019  . Abdominal tenderness 12/18/2019  . History of pancreatitis 01/20/2019  . Atherosclerosis 08/08/2018  . Glaucoma 05/26/2018  . Hypertensive retinopathy 05/26/2018  . Seronegative rheumatoid arthritis ([place]) 03/04/2018  . Senile purpura ([place]) 02/01/2015  . Former smoker 10/12/2013  . History of peptic ulcer disease 07/13/2013  . Osteopenia 05/07/2013  . Hypothyroidism 01/12/2012  . Type 2 diabetes with complication ([place]) 56/43/3295  . Hypertension associated with diabetes ([place]) 05/21/2011  . Exocrine pancreatic insufficiency 05/21/2011  . Hyperlipidemia associated with type 2 diabetes mellitus ([place]) 05/21/2011    Past Medical History:  Diagnosis Date  . Chronic kidney disease    RENAL STONE  . Chronic pancreatitis ([place])   . Diabetes mellitus   . Duodenal ulcer   . Dyslipidemia   . GERD (gastroesophageal reflux disease)   . Hypertension   . Osteopenia   . Thyroid disease    HYPOTHYROID  . Vitamin D deficiency     Family History  Problem Relation Age of Onset  . Stroke Mother   .  Heart disease Father   . Healthy Daughter   . Healthy Daughter    Past Surgical History:  Procedure Laterality Date  . APPENDECTOMY    . CATARACT EXTRACTION Right 2014  . CHOLECYSTECTOMY    . TONSILECTOMY, ADENOIDECTOMY, BILATERAL MYRINGOTOMY AND TUBES    . [place]   Social History   Social History  Narrative  . Not on file   Immunization History  Administered Date(s) Administered  . Fluad Quad(high Dose 65+) 06/01/2019, 05/10/2020  . Influenza Split 05/21/2011, 06/12/2012, 07/15/2013  . Influenza, High Dose Seasonal PF 07/13/2013, 07/01/2014, 06/01/2015, 06/15/2016, 04/15/2017, 05/19/2018  . PFIZER(Purple Top)SARS-COV-2 Vaccination 09/18/2019, 10/09/2019  . Pneumococcal Conjugate-13 06/15/2016  . Pneumococcal Polysaccharide-23 06/12/2012  . Tdap 06/28/2011  . Zoster 06/27/2013     Objective: Vital Signs: BP 123/85 (BP Location: Left Arm, Patient Position: Sitting, Cuff Size: Normal)   Pulse 73   Resp 13   Ht 5' 2.75" (1.594 m)   Wt 108 lb 6.4 oz (49.2 kg)   LMP  (LMP Unknown)   BMI 19.36 kg/m    Physical Exam Vitals and nursing note reviewed.  Constitutional:      Appearance: She is well-developed.  HENT:     Head: Normocephalic and atraumatic.  Eyes:     Conjunctiva/sclera: Conjunctivae normal.  Pulmonary:     Effort: Pulmonary effort is normal.  Abdominal:     Palpations: Abdomen is soft.  Musculoskeletal:     Cervical back: Normal range of motion.  Skin:    General: Skin is warm and dry.     Capillary Refill: Capillary refill takes less than 2 seconds.  Neurological:     Mental Status: She is alert and oriented to person, place, and time.  Psychiatric:        Behavior: Behavior normal.      Musculoskeletal Exam: C-spine good ROM.  Postural thoracic kyphosis.  No midline spinal tenderness or SI joint tenderness.  Shoulder joints, elbow joints, wrist joints, MCPs, PIPs, and DIPs good ROM with no synovitis.  Nipomo joint prominence bilaterally.  Subluxation of both CMC joints and left 1st MCP joint. Complete fist formation bilaterally.  Hip joints, knee joints, and ankle joints good ROM with no discomfort.  No warmth or effusion of knee joints.  No tenderness over ankle joints.   CDAI Exam: CDAI Score: 0  Patient Global: 0 mm; Provider Global: 0 mm Swollen: 0  ; Tender: 0  Joint Exam 11/08/2020   No joint exam has been documented for this visit   There is currently no information documented on the homunculus. Go to the Rheumatology activity and complete the homunculus joint exam.  Investigation: No additional findings.  Imaging: No results found.  Recent Labs: Lab Results  Component Value Date   WBC 7.1 11/07/2020   HGB 12.2 11/07/2020   PLT 324 11/07/2020   NA 131 (L) 11/07/2020   K 5.1 11/07/2020   CL 97 (L) 11/07/2020   CO2 29 11/07/2020   GLUCOSE 236 (H) 11/07/2020   BUN 20 11/07/2020   CREATININE 1.00 (H) 11/07/2020   BILITOT 0.2 11/07/2020   ALKPHOS 77 03/09/2020   AST 15 11/07/2020   ALT 13 11/07/2020   PROT 6.7 11/07/2020   ALBUMIN 3.4 (L) 03/09/2020   CALCIUM 9.4 11/07/2020   GFRAA 63 11/07/2020   QFTBGOLDPLUS NEGATIVE 03/30/2019    Speciality Comments: PLQ Eye Exam: 09/19/2020 WNL @ [facility] Follow up in 6 months   Procedures:  No  procedures performed Allergies: Levofloxacin and Codeine   Assessment / Plan:     Visit Diagnoses: Seronegative rheumatoid arthritis ([place]) - RF-,CCP-, 14-3-3 eta negative, Uric acid 4.8, ANA negative, no erosive changes on XR of hands: She has no joint tenderness or synovitis on exam.  She has not had any recent rheumatoid arthritis flares.  She is clinically doing well taking Plaquenil 200 mg 1 tablet by mouth daily and Arava 20 mg 1 tablet by mouth daily.  She is tolerating both medications and has not missed any doses recently.  She is not experiencing any joint pain or inflammation at this time.  Her morning stiffness has been lasting 5 to 10 minutes.  She has noticed some decreased grip strength in both hands.  We discussed the importance of joint protection and muscle strengthening.  She was given a handout of hand exercises to perform.  She will continue on the current treatment regimen.  She was advised to notify us if she develops increased joint pain or joint swelling.  She  will follow-up in the office in 5 months.  High risk medication use - Arava 20 mg 1 tablet by mouth daily, Plaquenil 200 mg 1 tablet by mouth daily.  PLQ Eye Exam: 09/19/2020 WNL @ [facility] Follow up in 6 months.  CBC and CMP updated on 11/07/20.  Lab work was reviewed with the patient today in the office and all questions were addressed.  Her next lab work will be due in June and every 3 months to monitor for drug toxicity.  Standing orders for CBC and CMP are in place. She has not had any recent infections. She has received 2 Pfizer COVID-19 vaccine doses but does not plan on receiving the booster.  DDD (degenerative disc disease), cervical: She has good range of motion with no discomfort.  She has not been experiencing any stiffness or symptoms of radiculopathy.  Osteopenia of multiple sites - DEXA on 02/18/18: The BMD measured at [facility] Right is 0.700 g/cm2 with a T-score of -2.4. Updated DEXA order placed by Dr. Redmond School.  She continues to take vitamin D and calcium supplement on a daily basis.  Vitamin D deficiency: She takes vitamin D 1000 units twice daily.  Other medical conditions are listed as follows:  Hypertension associated with diabetes ([place])  History of gastroesophageal reflux (GERD)  History of hypothyroidism  Senile purpura ([place])  History of chronic pancreatitis  Hyperlipidemia associated with type 2 diabetes mellitus ([place])  History of peptic ulcer disease  Former smoker  Orders: No orders of the defined types were placed in this encounter.  No orders of the defined types were placed in this encounter.    Follow-Up Instructions: Return in about 5 months (around 04/10/2021) for Rheumatoid arthritis, DDD.   Ofilia Neas, PA-C  Note - This record has been created using Dragon software.  Chart creation errors have been sought, but may not always  have been located. Such creation errors do not reflect on  the standard of medical care.

## 2020-11-07 ENCOUNTER — Other Ambulatory Visit: Payer: Self-pay

## 2020-11-07 DIAGNOSIS — Z79899 Other long term (current) drug therapy: Secondary | ICD-10-CM | POA: Diagnosis not present

## 2020-11-08 ENCOUNTER — Other Ambulatory Visit: Payer: Self-pay

## 2020-11-08 ENCOUNTER — Encounter: Payer: Self-pay | Admitting: Physician Assistant

## 2020-11-08 ENCOUNTER — Ambulatory Visit: Payer: Medicare HMO | Admitting: Physician Assistant

## 2020-11-08 VITALS — BP 123/85 | HR 73 | Resp 13 | Ht 62.75 in | Wt 108.4 lb

## 2020-11-08 DIAGNOSIS — Z79899 Other long term (current) drug therapy: Secondary | ICD-10-CM | POA: Diagnosis not present

## 2020-11-08 DIAGNOSIS — E1159 Type 2 diabetes mellitus with other circulatory complications: Secondary | ICD-10-CM

## 2020-11-08 DIAGNOSIS — M5030 Other cervical disc degeneration, unspecified cervical region: Secondary | ICD-10-CM

## 2020-11-08 DIAGNOSIS — M8589 Other specified disorders of bone density and structure, multiple sites: Secondary | ICD-10-CM

## 2020-11-08 DIAGNOSIS — E1169 Type 2 diabetes mellitus with other specified complication: Secondary | ICD-10-CM

## 2020-11-08 DIAGNOSIS — I152 Hypertension secondary to endocrine disorders: Secondary | ICD-10-CM

## 2020-11-08 DIAGNOSIS — M0600 Rheumatoid arthritis without rheumatoid factor, unspecified site: Secondary | ICD-10-CM

## 2020-11-08 DIAGNOSIS — Z8639 Personal history of other endocrine, nutritional and metabolic disease: Secondary | ICD-10-CM

## 2020-11-08 DIAGNOSIS — E559 Vitamin D deficiency, unspecified: Secondary | ICD-10-CM | POA: Diagnosis not present

## 2020-11-08 DIAGNOSIS — Z8719 Personal history of other diseases of the digestive system: Secondary | ICD-10-CM | POA: Diagnosis not present

## 2020-11-08 DIAGNOSIS — E785 Hyperlipidemia, unspecified: Secondary | ICD-10-CM

## 2020-11-08 DIAGNOSIS — Z8711 Personal history of peptic ulcer disease: Secondary | ICD-10-CM

## 2020-11-08 DIAGNOSIS — Z87891 Personal history of nicotine dependence: Secondary | ICD-10-CM

## 2020-11-08 DIAGNOSIS — D692 Other nonthrombocytopenic purpura: Secondary | ICD-10-CM | POA: Diagnosis not present

## 2020-11-08 LAB — COMPLETE METABOLIC PANEL WITH GFR
AG Ratio: 1.4 (calc) (ref 1.0–2.5)
ALT: 13 U/L (ref 6–29)
AST: 15 U/L (ref 10–35)
Albumin: 3.9 g/dL (ref 3.6–5.1)
Alkaline phosphatase (APISO): 105 U/L (ref 37–153)
BUN/Creatinine Ratio: 20 (calc) (ref 6–22)
BUN: 20 mg/dL (ref 7–25)
CO2: 29 mmol/L (ref 20–32)
Calcium: 9.4 mg/dL (ref 8.6–10.4)
Chloride: 97 mmol/L — ABNORMAL LOW (ref 98–110)
Creat: 1 mg/dL — ABNORMAL HIGH (ref 0.60–0.93)
GFR, Est African American: 63 mL/min/{1.73_m2} (ref >=60)
GFR, Est Non African American: 54 mL/min/{1.73_m2} — ABNORMAL LOW (ref >=60)
Globulin: 2.8 g/dL (calc) (ref 1.9–3.7)
Glucose, Bld: 236 mg/dL — ABNORMAL HIGH (ref 65–99)
Potassium: 5.1 mmol/L (ref 3.5–5.3)
Sodium: 131 mmol/L — ABNORMAL LOW (ref 135–146)
Total Bilirubin: 0.2 mg/dL (ref 0.2–1.2)
Total Protein: 6.7 g/dL (ref 6.1–8.1)

## 2020-11-08 LAB — CBC WITH DIFFERENTIAL/PLATELET
Absolute Monocytes: 774 cells/uL (ref 200–950)
Basophils Absolute: 50 cells/uL (ref 0–200)
Basophils Relative: 0.7 %
Eosinophils Absolute: 476 cells/uL (ref 15–500)
Eosinophils Relative: 6.7 %
HCT: 37 % (ref 35.0–45.0)
Hemoglobin: 12.2 g/dL (ref 11.7–15.5)
Lymphs Abs: 2123 cells/uL (ref 850–3900)
MCH: 30.7 pg (ref 27.0–33.0)
MCHC: 33 g/dL (ref 32.0–36.0)
MCV: 93 fL (ref 80.0–100.0)
MPV: 9.4 fL (ref 7.5–12.5)
Monocytes Relative: 10.9 %
Neutro Abs: 3678 cells/uL (ref 1500–7800)
Neutrophils Relative %: 51.8 %
Platelets: 324 10*3/uL (ref 140–400)
RBC: 3.98 10*6/uL (ref 3.80–5.10)
RDW: 12.4 % (ref 11.0–15.0)
Total Lymphocyte: 29.9 %
WBC: 7.1 10*3/uL (ref 3.8–10.8)

## 2020-11-08 NOTE — Patient Instructions (Signed)
Standing Labs We placed an order today for your standing lab work.   Please have your standing labs drawn in June and every 3 months   If possible, please have your labs drawn 2 weeks prior to your appointment so that the provider can discuss your results at your appointment.  We have open lab daily Monday through Thursday from 1:30-4:30 PM and Friday from 1:30-4:00 PM at the office of Dr. Shaili Deveshwar, [facility].   Please be advised, all patients with office appointments requiring lab work will take precedents over walk-in lab work.  If possible, please come for your lab work on Monday and Friday afternoons, as you may experience shorter wait times. The office is located at [address], [address], [place], [place] [address] No appointment is necessary.   Labs are drawn by [facility]. Please bring your co-pay at the time of your lab draw.  You may receive a bill from [facility] for your lab work.  If you wish to have your labs drawn at another location, please call the office 24 hours in advance to send orders.  If you have any questions regarding directions or hours of operation,  please call [phone].   As a reminder, please drink plenty of water prior to coming for your lab work. Thanks!   Hand Exercises Hand exercises can be helpful for almost anyone. These exercises can strengthen the hands, improve flexibility and movement, and increase blood flow to the hands. These results can make work and daily tasks easier. Hand exercises can be especially helpful for people who have joint pain from arthritis or have nerve damage from overuse (carpal tunnel syndrome). These exercises can also help people who have injured a hand. Exercises Most of these hand exercises are gentle stretching and motion exercises. It is usually safe to do them often throughout the day. Warming up your hands before exercise may help to reduce stiffness. You can do this with gentle massage or by placing  your hands in warm water for 10-15 minutes. It is normal to feel some stretching, pulling, tightness, or mild discomfort as you begin new exercises. This will gradually improve. Stop an exercise right away if you feel sudden, severe pain or your pain gets worse. Ask your health care provider which exercises are best for you. Knuckle bend or "claw" fist 1. Stand or sit with your arm, hand, and all five fingers pointed straight up. Make sure to keep your wrist straight during the exercise. 2. Gently bend your fingers down toward your palm until the tips of your fingers are touching the top of your palm. Keep your big knuckle straight and just bend the small knuckles in your fingers. 3. Hold this position for __________ seconds. 4. Straighten (extend) your fingers back to the starting position. Repeat this exercise 5-10 times with each hand. Full finger fist 1. Stand or sit with your arm, hand, and all five fingers pointed straight up. Make sure to keep your wrist straight during the exercise. 2. Gently bend your fingers into your palm until the tips of your fingers are touching the middle of your palm. 3. Hold this position for __________ seconds. 4. Extend your fingers back to the starting position, stretching every joint fully. Repeat this exercise 5-10 times with each hand. Straight fist 1. Stand or sit with your arm, hand, and all five fingers pointed straight up. Make sure to keep your wrist straight during the exercise. 2. Gently bend your fingers at the big   knuckle, where your fingers meet your hand, and the middle knuckle. Keep the knuckle at the tips of your fingers straight and try to touch the bottom of your palm. 3. Hold this position for __________ seconds. 4. Extend your fingers back to the starting position, stretching every joint fully. Repeat this exercise 5-10 times with each hand. Tabletop 1. Stand or sit with your arm, hand, and all five fingers pointed straight up. Make sure  to keep your wrist straight during the exercise. 2. Gently bend your fingers at the big knuckle, where your fingers meet your hand, as far down as you can while keeping the small knuckles in your fingers straight. Think of forming a tabletop with your fingers. 3. Hold this position for __________ seconds. 4. Extend your fingers back to the starting position, stretching every joint fully. Repeat this exercise 5-10 times with each hand. Finger spread 1. Place your hand flat on a table with your palm facing down. Make sure your wrist stays straight as you do this exercise. 2. Spread your fingers and thumb apart from each other as far as you can until you feel a gentle stretch. Hold this position for __________ seconds. 3. Bring your fingers and thumb tight together again. Hold this position for __________ seconds. Repeat this exercise 5-10 times with each hand. Making circles 1. Stand or sit with your arm, hand, and all five fingers pointed straight up. Make sure to keep your wrist straight during the exercise. 2. Make a circle by touching the tip of your thumb to the tip of your index finger. 3. Hold for __________ seconds. Then open your hand wide. 4. Repeat this motion with your thumb and each finger on your hand. Repeat this exercise 5-10 times with each hand. Thumb motion 1. Sit with your forearm resting on a table and your wrist straight. Your thumb should be facing up toward the ceiling. Keep your fingers relaxed as you move your thumb. 2. Lift your thumb up as high as you can toward the ceiling. Hold for __________ seconds. 3. Bend your thumb across your palm as far as you can, reaching the tip of your thumb for the small finger (pinkie) side of your palm. Hold for __________ seconds. Repeat this exercise 5-10 times with each hand. Grip strengthening 1. Hold a stress ball or other soft ball in the middle of your hand. 2. Slowly increase the pressure, squeezing the ball as much as you can  without causing pain. Think of bringing the tips of your fingers into the middle of your palm. All of your finger joints should bend when doing this exercise. 3. Hold your squeeze for __________ seconds, then relax. Repeat this exercise 5-10 times with each hand.   Contact a health care provider if:  Your hand pain or discomfort gets much worse when you do an exercise.  Your hand pain or discomfort does not improve within 2 hours after you exercise. If you have any of these problems, stop doing these exercises right away. Do not do them again unless your health care provider says that you can. Get help right away if:  You develop sudden, severe hand pain or swelling. If this happens, stop doing these exercises right away. Do not do them again unless your health care provider says that you can. This information is not intended to replace advice given to you by your health care provider. Make sure you discuss any questions you have with your health care provider. Document Revised:   12/04/2018 Document Reviewed: 08/14/2018 Elsevier Patient Education  2021 Elsevier Inc.  

## 2020-11-08 NOTE — Progress Notes (Signed)
Glucose is high.  GFR is low and stable.  CBC is normal.  Please notify patient.

## 2020-11-11 ENCOUNTER — Other Ambulatory Visit: Payer: Self-pay | Admitting: Physician Assistant

## 2020-11-11 ENCOUNTER — Other Ambulatory Visit: Payer: Self-pay | Admitting: Family Medicine

## 2020-11-11 DIAGNOSIS — I152 Hypertension secondary to endocrine disorders: Secondary | ICD-10-CM

## 2020-11-11 DIAGNOSIS — E1169 Type 2 diabetes mellitus with other specified complication: Secondary | ICD-10-CM

## 2020-11-11 DIAGNOSIS — E1159 Type 2 diabetes mellitus with other circulatory complications: Secondary | ICD-10-CM

## 2020-11-11 DIAGNOSIS — M0600 Rheumatoid arthritis without rheumatoid factor, unspecified site: Secondary | ICD-10-CM

## 2020-11-14 NOTE — Telephone Encounter (Signed)
Next Visit: 04/11/2021  Last Visit: 11/08/2020  Last Fill: 09/06/2020  DX: Seronegative rheumatoid arthritis   Current Dose per office note 11/08/2020, Arava 20 mg 1 tablet by mouth daily  Labs: 11/07/2020, Glucose is high. GFR is low and stable. CBC is normal.   Okay to refill Arava?

## 2020-12-07 IMAGING — DX DG CHEST 1V PORT
1 series · 1 of 1 positions shown · non-contrast
Comparison: Chest x-ray dated December 09, 2019.

CLINICAL DATA: Nausea, vomiting, diarrhea for the past month.

EXAM:
PORTABLE CHEST 1 VIEW

[chest ap]
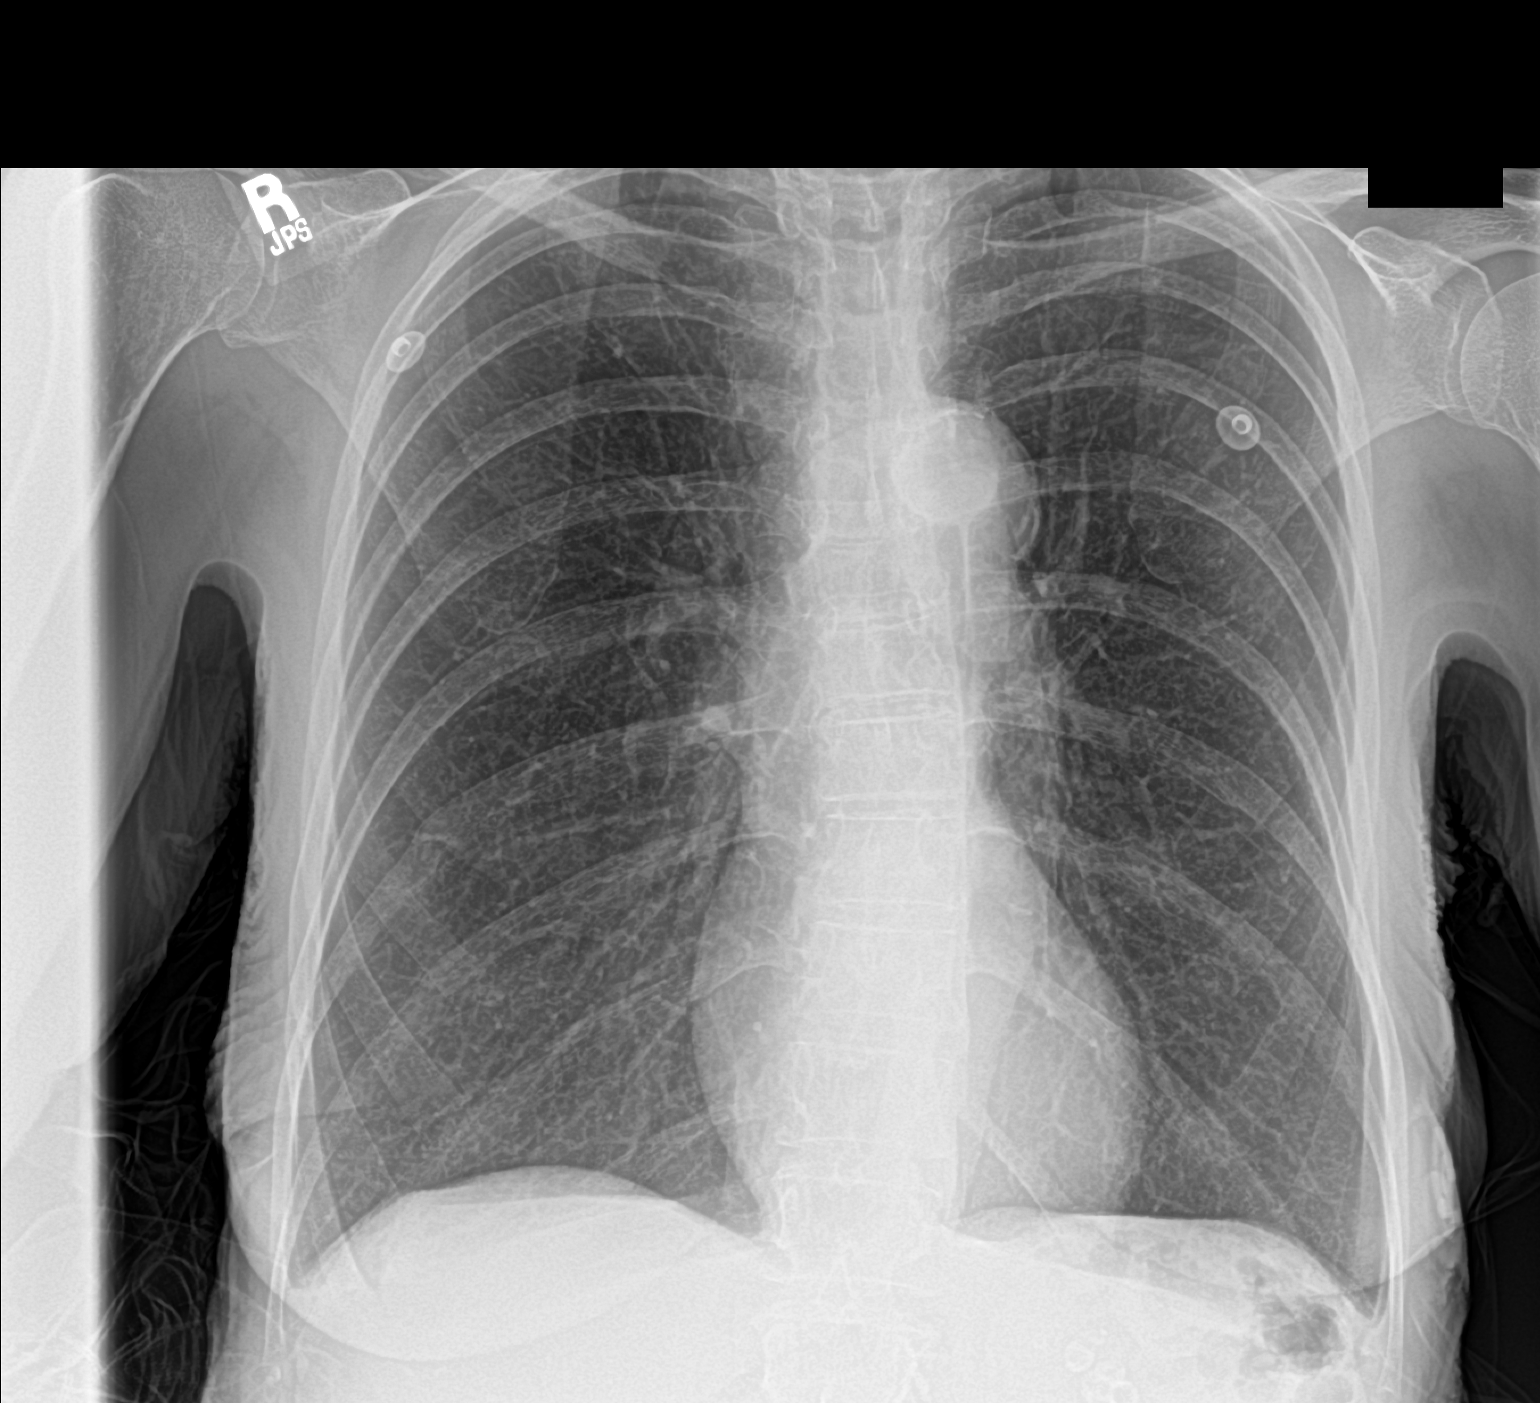

[1 of 1 positions shown; findings below may reference images not displayed]

FINDINGS: The heart size and mediastinal contours are within normal limits.
Both lungs are clear. The visualized skeletal structures are
unremarkable.
IMPRESSION: No active disease.

## 2020-12-20 ENCOUNTER — Other Ambulatory Visit: Payer: Self-pay | Admitting: Rheumatology

## 2020-12-20 DIAGNOSIS — M0600 Rheumatoid arthritis without rheumatoid factor, unspecified site: Secondary | ICD-10-CM

## 2020-12-20 NOTE — Telephone Encounter (Signed)
Last Visit: 11/08/2020 Next Visit: 04/11/2021 Labs: 11/07/2020, Glucose is high. GFR is low and stable. CBC is normal. Please notify patient. Eye exam:  09/19/2020  Current Dose per office note 11/08/2020, Plaquenil 200 mg 1 tablet by mouth daily  DX: Seronegative rheumatoid arthritis   Last Fill: 06/13/2020  Okay to refill Plaquenil?

## 2020-12-27 ENCOUNTER — Other Ambulatory Visit: Payer: Self-pay | Admitting: Family Medicine

## 2020-12-28 ENCOUNTER — Other Ambulatory Visit: Payer: Self-pay

## 2020-12-28 ENCOUNTER — Other Ambulatory Visit: Payer: Self-pay | Admitting: Family Medicine

## 2020-12-28 MED ORDER — LEVOTHYROXINE SODIUM 125 MCG PO TABS: 125.0000 ug | ORAL_TABLET | Freq: Every day | ORAL | 3 refills | Status: DC

## 2020-12-28 NOTE — Telephone Encounter (Signed)
Deborah Travis is requesting to fill 1,000 cap. Please advise if this is ok. [place]

## 2021-01-19 ENCOUNTER — Other Ambulatory Visit: Payer: Self-pay

## 2021-01-19 ENCOUNTER — Other Ambulatory Visit: Payer: Self-pay | Admitting: Family Medicine

## 2021-01-19 ENCOUNTER — Other Ambulatory Visit: Payer: Self-pay | Admitting: Physician Assistant

## 2021-01-19 DIAGNOSIS — E1159 Type 2 diabetes mellitus with other circulatory complications: Secondary | ICD-10-CM

## 2021-01-19 DIAGNOSIS — E785 Hyperlipidemia, unspecified: Secondary | ICD-10-CM

## 2021-01-19 DIAGNOSIS — M0600 Rheumatoid arthritis without rheumatoid factor, unspecified site: Secondary | ICD-10-CM

## 2021-01-19 DIAGNOSIS — I152 Hypertension secondary to endocrine disorders: Secondary | ICD-10-CM

## 2021-01-19 DIAGNOSIS — E1169 Type 2 diabetes mellitus with other specified complication: Secondary | ICD-10-CM

## 2021-01-19 NOTE — Telephone Encounter (Signed)
Next Visit: 04/11/2021  Last Visit: 11/08/2020  Last Fill: 11/14/2020  DX:  Seronegative rheumatoid arthritis   Current Dose per office note 11/08/2020, Arava 20 mg 1 tablet by mouth daily  Labs: 11/07/2020, Glucose is high. GFR is low and stable. CBC is normal. Please notify patient.  Okay to refill Arava?

## 2021-01-25 ENCOUNTER — Other Ambulatory Visit: Payer: Self-pay

## 2021-01-25 ENCOUNTER — Ambulatory Visit (INDEPENDENT_AMBULATORY_CARE_PROVIDER_SITE_OTHER): Payer: Medicare HMO | Admitting: Family Medicine

## 2021-01-25 ENCOUNTER — Encounter: Payer: Self-pay | Admitting: Family Medicine

## 2021-01-25 VITALS — BP 108/72 | HR 95 | Temp 98.1°F | Ht 62.75 in | Wt 110.0 lb

## 2021-01-25 DIAGNOSIS — Z8711 Personal history of peptic ulcer disease: Secondary | ICD-10-CM

## 2021-01-25 DIAGNOSIS — Z23 Encounter for immunization: Secondary | ICD-10-CM | POA: Diagnosis not present

## 2021-01-25 DIAGNOSIS — K8681 Exocrine pancreatic insufficiency: Secondary | ICD-10-CM

## 2021-01-25 DIAGNOSIS — E1159 Type 2 diabetes mellitus with other circulatory complications: Secondary | ICD-10-CM

## 2021-01-25 DIAGNOSIS — E118 Type 2 diabetes mellitus with unspecified complications: Secondary | ICD-10-CM

## 2021-01-25 DIAGNOSIS — E039 Hypothyroidism, unspecified: Secondary | ICD-10-CM

## 2021-01-25 DIAGNOSIS — I152 Hypertension secondary to endocrine disorders: Secondary | ICD-10-CM | POA: Diagnosis not present

## 2021-01-25 LAB — HEMOGLOBIN A1C
Est. average glucose Bld gHb Est-mCnc: 298 mg/dL
Hgb A1c MFr Bld: 12 % — ABNORMAL HIGH (ref 4.8–5.6)

## 2021-01-25 LAB — POCT GLYCOSYLATED HEMOGLOBIN (HGB A1C): Hemoglobin A1C: 11.5 % — AB (ref 4.0–5.6)

## 2021-01-25 MED ORDER — OMEPRAZOLE 40 MG PO CPDR: 40.0000 mg | DELAYED_RELEASE_CAPSULE | Freq: Every day | ORAL | 3 refills | Status: DC

## 2021-01-25 NOTE — Patient Instructions (Signed)
Try Claritin or Allegra to see if that will help with your symptoms and keep me informed

## 2021-01-25 NOTE — Progress Notes (Signed)
Subjective:    Patient ID: Deborah Travis, female    DOB: 08-Aug-1941, 80 y.o.   MRN: [id number]  Deborah Travis is a 80 y.o. female who presents for follow-up of Type 2 diabetes mellitus.  Home blood sugar records: meter records, two hours post meal , avg. 125 Current symptoms/problems include none at this time. Daily foot checks: yes   Any foot concerns: none  Exercise: staying active  Diet: fair  She states that she is now noticing hand discomfort interfering with her ADLs.  She has trouble now opening and closing things.  She does have underlying rheumatoid arthritis and is on a DMARD for this.  She does plan to follow-up with her rheumatologist.  She also has noticed some changes in her nails.  She is on a good multivitamin.  She also complains of difficulty with a dry cough that can occur at night as well as during the day.  She notes that this actually started after she moved to a new area.  She does not necessarily complain of sneezing, itchy watery eyes.  She also is taking Prilosec twice per day at the 20 mg dosing which helps with stomach distress.  She does have a previous history of ulcer disease as well as pancreatic insufficiency.  She continues on Creon for that. The following portions of the patient's history were reviewed and updated as appropriate: allergies, current medications, past medical history, past social history and problem list.  ROS as in subjective above.     Objective:    Physical Exam Alert and in no distress .  Exam of the nails does show flattening and slight lysis of the nails.  Exam of her hands does show arthritic changes with thenar atrophy.  Fairly good strength is noted. Lab Review Diabetic Labs Latest Ref Rng & Units 11/07/2020 08/12/2020 06/09/2020 05/10/2020 03/09/2020  HbA1c 4.0 - 5.6 % - 6.1(A) - 5.7(A) -  Microalbumin mg/L - - - - -  Micro/Creat Ratio - - - - - -  Chol 100 - 199 mg/dL - - - 114 -  HDL >39 mg/dL - - - 62 -  Calc LDL 0 - 99 mg/dL  - - - 32 -  Triglycerides 0 - 149 mg/dL - - - 110 -  Creatinine 0.60 - 0.93 mg/dL 1.00(H) - 1.01(H) - 1.02(H)   BP/Weight 11/08/2020 08/12/2020 06/09/2020 05/10/2020 7/00/1749  Systolic BP 449 675 93 98 -  Diastolic BP 85 78 62 56 -  Wt. (Lbs) 108.4 89.8 92.4 89.6 86  BMI 19.36 16.03 16.5 15.38 15.36   Foot/eye exam completion dates Latest Ref Rng & Units 09/19/2020 09/17/2019  Eye Exam No Retinopathy Retinopathy(A) No Retinopathy  Foot Form Completion - - -  Hemoglobin A1c is 11.5.  Deborah Travis  reports that she quit smoking about 5 years ago. She smoked 0.10 packs per day. She has never used smokeless tobacco. She reports that she does not drink alcohol and does not use drugs.     Assessment & Plan:    Type 2 diabetes with complication ([place]) - Plan: POCT glycosylated hemoglobin (Hb A1C), Hemoglobin A1c  Hypertension associated with diabetes ([facility])  Hypothyroidism, unspecified type  Exocrine pancreatic insufficiency - Plan: omeprazole (PRILOSEC) 40 MG capsule  History of peptic ulcer disease  Immunization, viral disease - Plan: PFIZER Comirnaty(GRAY TOP)COVID-19 Vaccine   1. Rx changes: I will give her Prilosec 40 mg to see if this will help with her GI symptoms and also to see  if he has any effect on her cough.  She is also to use Claritin to see if that will help with the cough.  We might need to pursue this further.  Her granddaughter works for dermatologist and she will try to set up an appointment for further evaluation of her fingernails.  She will follow-up with rheumatologist concerning the hand issues. 2. Education: Reviewed 'ABCs' of diabetes management (respective goals in parentheses):  A1C (<7), blood pressure (<130/80), and cholesterol (LDL <100). 3. Compliance at present is estimated to be good. Efforts to improve compliance (if necessary) will be directed at No change. 4. Follow up: 4 months The hemoglobin A1c does not fit with her previous A1c's.  I will check the blood  study on that and adjust accordingly.  She might possibly need a new glucometer.

## 2021-01-26 NOTE — Addendum Note (Signed)
Addended by: [name] on: 01/26/2021 08:18 AM   Modules accepted: Orders

## 2021-01-27 ENCOUNTER — Other Ambulatory Visit: Payer: Medicare HMO

## 2021-01-27 ENCOUNTER — Other Ambulatory Visit: Payer: Self-pay

## 2021-01-27 DIAGNOSIS — E118 Type 2 diabetes mellitus with unspecified complications: Secondary | ICD-10-CM | POA: Diagnosis not present

## 2021-01-27 DIAGNOSIS — Z79899 Other long term (current) drug therapy: Secondary | ICD-10-CM

## 2021-01-27 NOTE — Patient Outreach (Signed)
[facility]) Care Management  01/27/2021  Deborah Travis [birth year] [id number]   Telephone call for disease management referral from [facility] campaign. No answer.  HIPAA compliant voice message left.  Plan: RN  CM will attempt again within 4 business days and send letter.   Jone Baseman, RN, MSN [place] Management Care Management Coordinator Direct Line [phone] Cell [phone] Toll Free: [phone]  Fax: [phone]

## 2021-01-28 LAB — CBC WITH DIFFERENTIAL/PLATELET
Absolute Monocytes: 872 cells/uL (ref 200–950)
Basophils Absolute: 43 cells/uL (ref 0–200)
Basophils Relative: 0.7 %
Eosinophils Absolute: 250 cells/uL (ref 15–500)
Eosinophils Relative: 4.1 %
HCT: 39.2 % (ref 35.0–45.0)
Hemoglobin: 12.8 g/dL (ref 11.7–15.5)
Lymphs Abs: 2220 cells/uL (ref 850–3900)
MCH: 30.4 pg (ref 27.0–33.0)
MCHC: 32.7 g/dL (ref 32.0–36.0)
MCV: 93.1 fL (ref 80.0–100.0)
MPV: 9.7 fL (ref 7.5–12.5)
Monocytes Relative: 14.3 %
Neutro Abs: 2715 cells/uL (ref 1500–7800)
Neutrophils Relative %: 44.5 %
Platelets: 289 10*3/uL (ref 140–400)
RBC: 4.21 10*6/uL (ref 3.80–5.10)
RDW: 12.6 % (ref 11.0–15.0)
Total Lymphocyte: 36.4 %
WBC: 6.1 10*3/uL (ref 3.8–10.8)

## 2021-01-28 LAB — COMPLETE METABOLIC PANEL WITH GFR
AG Ratio: 1.3 (calc) (ref 1.0–2.5)
ALT: 21 U/L (ref 6–29)
AST: 21 U/L (ref 10–35)
Albumin: 4 g/dL (ref 3.6–5.1)
Alkaline phosphatase (APISO): 122 U/L (ref 37–153)
BUN/Creatinine Ratio: 20 (calc) (ref 6–22)
BUN: 29 mg/dL — ABNORMAL HIGH (ref 7–25)
CO2: 22 mmol/L (ref 20–32)
Calcium: 9.4 mg/dL (ref 8.6–10.4)
Chloride: 103 mmol/L (ref 98–110)
Creat: 1.46 mg/dL — ABNORMAL HIGH (ref 0.60–0.93)
GFR, Est African American: 40 mL/min/{1.73_m2} — ABNORMAL LOW (ref >=60)
GFR, Est Non African American: 34 mL/min/{1.73_m2} — ABNORMAL LOW (ref >=60)
Globulin: 3.1 g/dL (calc) (ref 1.9–3.7)
Glucose, Bld: 399 mg/dL — ABNORMAL HIGH (ref 65–99)
Potassium: 4.8 mmol/L (ref 3.5–5.3)
Sodium: 135 mmol/L (ref 135–146)
Total Bilirubin: 0.4 mg/dL (ref 0.2–1.2)
Total Protein: 7.1 g/dL (ref 6.1–8.1)

## 2021-01-28 LAB — INSULIN AND C-PEPTIDE, SERUM
C-Peptide: 3.4 ng/mL (ref 1.1–4.4)
INSULIN: 7.1 u[IU]/mL (ref 2.6–24.9)

## 2021-01-30 ENCOUNTER — Other Ambulatory Visit: Payer: Self-pay

## 2021-01-30 NOTE — Patient Outreach (Addendum)
[facility]) Care Management  01/30/2021  KAE LAUMAN [birth year] [id number]   Telephone call to patient for follow up from [facility]. Discussed reason for call. Patient reports she does not remember talking with anyone about [place] Management services previously.  Patient reports she is doing well and admantly refuses ongoing disease management support at this time.   Plan: RN CM will close case.    Jone Baseman, RN, MSN [place] Management Care Management Coordinator Direct Line [phone] Cell [phone] Toll Free: [phone]  Fax: [phone]

## 2021-01-30 NOTE — Progress Notes (Signed)
Glucose is high.  Please notify patient and her PCPs office.  Creatinine is elevated which could be due to the use of ACE inhibitor's.  Please forward labs to her PCP.  If patient has a nephrologist then forward labs to nephrology.  CBC is normal.  Patient had no synovitis on her examination at the last visit.  Please advise patient to reduce leflunomide 20 mg tablet, half tablet p.o. daily.  We can refill in the future for 10 mg tablets.

## 2021-02-02 ENCOUNTER — Ambulatory Visit: Payer: Medicare HMO

## 2021-02-02 NOTE — Progress Notes (Signed)
LVM for pt to call back for virtual consult on labs [facility]

## 2021-02-03 NOTE — Progress Notes (Signed)
Advised daughter to have mother call schedule a follow up virtual with JCL. Pope

## 2021-02-06 ENCOUNTER — Telehealth (INDEPENDENT_AMBULATORY_CARE_PROVIDER_SITE_OTHER): Payer: Medicare HMO | Admitting: Family Medicine

## 2021-02-06 ENCOUNTER — Encounter: Payer: Self-pay | Admitting: Family Medicine

## 2021-02-06 VITALS — Ht 62.75 in | Wt 110.0 lb

## 2021-02-06 DIAGNOSIS — E118 Type 2 diabetes mellitus with unspecified complications: Secondary | ICD-10-CM | POA: Diagnosis not present

## 2021-02-06 DIAGNOSIS — N1832 Chronic kidney disease, stage 3b: Secondary | ICD-10-CM

## 2021-02-06 DIAGNOSIS — K8681 Exocrine pancreatic insufficiency: Secondary | ICD-10-CM | POA: Diagnosis not present

## 2021-02-06 DIAGNOSIS — E1121 Type 2 diabetes mellitus with diabetic nephropathy: Secondary | ICD-10-CM | POA: Insufficient documentation

## 2021-02-06 NOTE — Progress Notes (Signed)
   Subjective:    Patient ID: Deborah Travis, female    DOB: 08/24/1941, 80 y.o.   MRN: [id number]  HPI Documentation for virtual audio and video telecommunications through [facility] encounter: The patient was located at home. 2 patient identifiers used.  The provider was located in the office. The patient did consent to this visit and is aware of possible charges through their insurance for this visit. Th other persons participating in this telemedicine service were none. Time spent on call was 5 minutes and in review of previous records >20 minutes total for counseling and coordination of care. This virtual service is not related to other E/M service within previous 7 days.  She is here for consult concerning recent hemoglobin A1c which was quite elevated.  She admits that prior to that she was eating a lot of potatoes with butter with the idea of trying to improve her weight.  She does have pancreatic insufficiency and is now on Zenpep and states that this has helped her diet and she is having less of BMs.  Recent blood work also showed evidence of CKD 3.  She has now taking metformin twice per day but does not have a meter.  She did have questions about proper diabetic diet.  Review of Systems     Objective:   Physical Exam Alert and in no distress otherwise not examined       Assessment & Plan:  Type 2 diabetes with complication (HCC)  Exocrine pancreatic insufficiency  Stage 3b chronic kidney disease ([place])  Diabetic nephropathy associated with type 2 diabetes mellitus ([place]) We will work on trying to get her glucometer and have her check her sugars before meal or 2 hours after meal.  She will continue on metformin twice per day.  Recommend she go to the American diabetes Association website and look up diabetic diet. Then discussed the CKD with her.  Explained she is now on lisinopril and hopefully with better diabetes control we can mitigate this. Virtual visit after she has  been checking her blood sugars for at least 2 weeks.  Hopefully better diet and increasing her metformin will help with her A1c's

## 2021-02-07 ENCOUNTER — Telehealth: Payer: Self-pay

## 2021-02-08 ENCOUNTER — Other Ambulatory Visit: Payer: Self-pay

## 2021-02-08 DIAGNOSIS — E118 Type 2 diabetes mellitus with unspecified complications: Secondary | ICD-10-CM

## 2021-02-08 MED ORDER — GLUCOSE BLOOD VI STRP: ORAL_STRIP | 3 refills | Status: DC

## 2021-02-08 MED ORDER — ACCU-CHEK SOFTCLIX LANCETS MISC: 1 refills | Status: DC

## 2021-02-08 NOTE — Telephone Encounter (Signed)
Lvm again awaiting call back  from pt . [place]

## 2021-03-03 ENCOUNTER — Other Ambulatory Visit: Payer: Self-pay

## 2021-03-03 DIAGNOSIS — E119 Type 2 diabetes mellitus without complications: Secondary | ICD-10-CM

## 2021-03-03 MED ORDER — METFORMIN HCL 1000 MG PO TABS: 1000.0000 mg | ORAL_TABLET | Freq: Two times a day (BID) | ORAL | 1 refills | Status: DC

## 2021-03-13 ENCOUNTER — Ambulatory Visit (INDEPENDENT_AMBULATORY_CARE_PROVIDER_SITE_OTHER): Payer: Medicare HMO | Admitting: Family Medicine

## 2021-03-13 ENCOUNTER — Other Ambulatory Visit: Payer: Self-pay

## 2021-03-13 ENCOUNTER — Encounter: Payer: Self-pay | Admitting: Family Medicine

## 2021-03-13 VITALS — BP 104/70 | HR 70 | Temp 97.7°F | Wt 111.6 lb

## 2021-03-13 DIAGNOSIS — E119 Type 2 diabetes mellitus without complications: Secondary | ICD-10-CM | POA: Diagnosis not present

## 2021-03-13 LAB — POCT GLYCOSYLATED HEMOGLOBIN (HGB A1C): Hemoglobin A1C: 8.5 % — AB (ref 4.0–5.6)

## 2021-03-13 NOTE — Progress Notes (Signed)
   Subjective:    Patient ID: Deborah Travis, female    DOB: 02-24-1941, 80 y.o.   MRN: [id number]  HPI She is here for a recheck.  She did get a new glucometer and her 30-day numbers average was 128.  She has now taking metformin twice per day and has made some dietary changes specifically cutting back on baked potatoes that she was eating 1/day.   Review of Systems     Objective:   Physical Exam  Alert and in no distress.  Hemoglobin A1c is now 8.5.      Assessment & Plan:  Type 2 diabetes mellitus without complication, without long-term current use of insulin (HCC) - Plan: HgB A1c I explained that her hemoglobin A1c is now up trending in the right direction but not truly accurate since she has made these dietary changes.  Explained that it will take at least 3 months before we will get an accurate reading.  Recommend she continue with her dietary modification.

## 2021-03-22 ENCOUNTER — Other Ambulatory Visit: Payer: Self-pay | Admitting: Physician Assistant

## 2021-03-22 DIAGNOSIS — M0600 Rheumatoid arthritis without rheumatoid factor, unspecified site: Secondary | ICD-10-CM

## 2021-03-22 NOTE — Telephone Encounter (Signed)
Last Visit: 11/08/2020  Next Visit: 04/11/2021  Labs: 01/27/2021 Glucose is high. Creatinine is elevated which could be due to the use of ACE inhibitor's.  CBC is normal.   Eye exam: 09/19/2020 WNL    Current Dose per office note 11/08/2020: Plaquenil 200 mg 1 tablet by mouth daily  [id number] rheumatoid arthritis   Last Fill: 12/20/2020  Okay to refill Plaquenil?

## 2021-03-27 DIAGNOSIS — H40021 Open angle with borderline findings, high risk, right eye: Secondary | ICD-10-CM | POA: Diagnosis not present

## 2021-03-27 DIAGNOSIS — H401121 Primary open-angle glaucoma, left eye, mild stage: Secondary | ICD-10-CM | POA: Diagnosis not present

## 2021-03-27 DIAGNOSIS — H04212 Epiphora due to excess lacrimation, left lacrimal gland: Secondary | ICD-10-CM | POA: Diagnosis not present

## 2021-03-27 DIAGNOSIS — H02822 Cysts of right lower eyelid: Secondary | ICD-10-CM | POA: Diagnosis not present

## 2021-03-28 NOTE — Progress Notes (Signed)
Office Visit Note  Patient: Deborah Travis             Date of Birth: [birth year]           MRN: [id number]             PCP: [name], MD Referring: [name], MD Visit Date: 04/11/2021 Occupation: '@GUAROCC'$ @  Subjective:  Pain in right hand.   History of Present Illness: Deborah Travis is a 80 y.o. female with a history of rheumatoid arthritis.  She states for the last month she has been having increased pain in her right hand.  In June 2022 her GFR was low and her hydroxychloroquine dose was reduced.  She states she has been taking hydroxychloroquine 200 mg tablet half a tablet every day.  None of the other joints are painful.  She denies any discomfort in her cervical spine today.  Activities of Daily Living:  Patient reports morning stiffness for all day. Patient Denies nocturnal pain.  Difficulty dressing/grooming: Reports Difficulty climbing stairs: Denies Difficulty getting out of chair: Denies Difficulty using hands for taps, buttons, cutlery, and/or writing: Reports  Review of Systems  Constitutional:  Positive for fatigue.  HENT:  Negative for mouth sores, mouth dryness and nose dryness.   Eyes:  Negative for pain, itching and dryness.  Respiratory:  Negative for shortness of breath and difficulty breathing.   Cardiovascular:  Negative for chest pain and palpitations.  Gastrointestinal:  Negative for blood in stool, constipation and diarrhea.  Endocrine: Negative for increased urination.  Genitourinary:  Negative for difficulty urinating.  Musculoskeletal:  Positive for joint pain, joint pain, joint swelling and morning stiffness. Negative for myalgias, muscle tenderness and myalgias.  Skin:  Negative for color change, rash and redness.  Allergic/Immunologic: Negative for susceptible to infections.  Neurological:  Negative for dizziness, numbness, headaches, memory loss and weakness.  Hematological:  Negative for bruising/bleeding tendency.   Psychiatric/Behavioral:  Negative for confusion.    PMFS History:  Patient Active Problem List   Diagnosis Date Noted   Diabetic nephropathy associated with type 2 diabetes mellitus ([place]) 02/06/2021   Stage 3b chronic kidney disease ([place]) 02/06/2021   Chronic calcific pancreatitis ([place]) 08/12/2020   Weight loss 12/18/2019   Abdominal tenderness 12/18/2019   History of pancreatitis 01/20/2019   Atherosclerosis 08/08/2018   Glaucoma 05/26/2018   Hypertensive retinopathy 05/26/2018   Seronegative rheumatoid arthritis ([place]) 03/04/2018   Senile purpura ([place]) 02/01/2015   Former smoker 10/12/2013   History of peptic ulcer disease 07/13/2013   Osteopenia 05/07/2013   Hypothyroidism 01/12/2012   Type 2 diabetes with complication ([place]) [id number]   Hypertension associated with diabetes ([place]) 05/21/2011   Exocrine pancreatic insufficiency 05/21/2011   Hyperlipidemia associated with type 2 diabetes mellitus ([place]) 05/21/2011    Past Medical History:  Diagnosis Date   Chronic kidney disease    RENAL STONE   Chronic pancreatitis (HCC)    Diabetes mellitus    Duodenal ulcer    Dyslipidemia    GERD (gastroesophageal reflux disease)    Hypertension    Osteopenia    Thyroid disease    HYPOTHYROID   Vitamin D deficiency     Family History  Problem Relation Age of Onset   Stroke Mother    Heart disease Father    Healthy Daughter    Healthy Daughter    Past Surgical History:  Procedure Laterality Date   APPENDECTOMY     CATARACT EXTRACTION Right 2014  CHOLECYSTECTOMY     TONSILECTOMY, ADENOIDECTOMY, BILATERAL MYRINGOTOMY AND TUBES     WHIPPLE PROCEDURE  1998   Social History   Social History Narrative   Not on file   Immunization History  Administered Date(s) Administered   Fluad Quad(high Dose 65+) 06/01/2019, 05/10/2020   Influenza Split 05/21/2011, 06/12/2012, 07/15/2013   Influenza, High Dose Seasonal PF 07/13/2013, 07/01/2014, 06/01/2015, 06/15/2016, 04/15/2017,  05/19/2018   PFIZER Comirnaty(Gray Top)Covid-19 Tri-Sucrose Vaccine 01/25/2021   PFIZER(Purple Top)SARS-COV-2 Vaccination 09/18/2019, 10/09/2019   Pneumococcal Conjugate-13 06/15/2016   Pneumococcal Polysaccharide-23 06/12/2012   Tdap 06/28/2011   Zoster, Live 06/27/2013     Objective: Vital Signs: BP 127/80 (BP Location: Left Arm, Patient Position: Sitting, Cuff Size: Normal)   Pulse 65   Ht 5' 2.75" (1.594 m)   Wt 114 lb 6.4 oz (51.9 kg)   LMP  (LMP Unknown)   BMI 20.43 kg/m    Physical Exam Vitals and nursing note reviewed.  Constitutional:      Appearance: She is well-developed.  HENT:     Head: Normocephalic and atraumatic.  Eyes:     Conjunctiva/sclera: Conjunctivae normal.  Cardiovascular:     Rate and Rhythm: Normal rate and regular rhythm.     Heart sounds: Normal heart sounds.  Pulmonary:     Effort: Pulmonary effort is normal.     Breath sounds: Normal breath sounds.  Abdominal:     General: Bowel sounds are normal.     Palpations: Abdomen is soft.  Musculoskeletal:     Cervical back: Normal range of motion.  Lymphadenopathy:     Cervical: No cervical adenopathy.  Skin:    General: Skin is warm and dry.     Capillary Refill: Capillary refill takes less than 2 seconds.  Neurological:     Mental Status: She is alert and oriented to person, place, and time.  Psychiatric:        Behavior: Behavior normal.     Musculoskeletal Exam: C-spine was in good range of motion.  Shoulder joints, elbow joints, wrist joints, MCPs PIPs and DIPs with good range of motion with no synovitis.  She is in PIP and DIP thickening consistent with osteoarthritis.  She had right middle and ring finger flexor tenosynovitis.  Hip joints, knee joints, ankles, MTPs and PIPs with good range of motion with no synovitis.  CDAI Exam: CDAI Score: -- Patient Global: --; Provider Global: -- Swollen: --; Tender: -- Joint Exam 04/11/2021   No joint exam has been documented for this visit    There is currently no information documented on the homunculus. Go to the Rheumatology activity and complete the homunculus joint exam.  Investigation: No additional findings.  Imaging: No results found.  Recent Labs: Lab Results  Component Value Date   WBC 6.1 01/27/2021   HGB 12.8 01/27/2021   PLT 289 01/27/2021   NA 135 01/27/2021   K 4.8 01/27/2021   CL 103 01/27/2021   CO2 22 01/27/2021   GLUCOSE 399 (H) 01/27/2021   BUN 29 (H) 01/27/2021   CREATININE 1.46 (H) 01/27/2021   BILITOT 0.4 01/27/2021   ALKPHOS 77 03/09/2020   AST 21 01/27/2021   ALT 21 01/27/2021   PROT 7.1 01/27/2021   ALBUMIN 3.4 (L) 03/09/2020   CALCIUM 9.4 01/27/2021   GFRAA 40 (L) 01/27/2021   QFTBGOLDPLUS NEGATIVE 03/30/2019    Speciality Comments: PLQ Eye Exam: 09/19/2020 WNL @ [facility] Follow up in 6 months   Procedures:  No procedures performed  Allergies: Levofloxacin and Codeine   Assessment / Plan:     Visit Diagnoses: Seronegative rheumatoid arthritis ([place]) - RF-,CCP-, 14-3-3 eta negative, Uric acid 4.8, ANA negative, no erosive changes on XR of hands: Patient had no synovitis on my examination today.  She states she has been taking hydroxychloroquine 100 mg p.o. daily since we advised her to reduce the dose to due to decreased GFR.  She is also on leflunomide 20 mg p.o. daily.  If her GFR is still low I may reduce her dose of leflunomide to 10 mg p.o. daily as she did not have any synovitis.  She denies any swelling in her hands.  Although she has been having discomfort in her right hand.  High risk medication use - Arava 20 mg 1 tablet by mouth daily, Plaquenil 100 mg 1 tablet by mouth daily.  PLQ Eye Exam: 09/19/2020 - Plan: BASIC METABOLIC PANEL WITH GFR, I was planning to do hydroxychloroquine blood level but canceled that she took the last Plaquenil dose an hour ago.  Updated information regarding realization was placed in the AVS.  Elevated serum creatinine-her creatinine level  was high and June.  I am uncertain if it is due to the use of ACE inhibitor.  We will check the level today.  If she has persistent elevation of creatinine we may have to refer her to nephrology.  Trigger finger, right middle-she had right middle trigger finger.  I would avoid use of topical NSAIDs.  Have advised her to massage the finger and if her symptoms do not improve then we can consider cortisone injection in the future.  Trigger finger, right ring-we will see response to the massage and exercises.  If she has persistent symptoms we will do cortisone injection in the future.  DDD (degenerative disc disease), cervical-she denies any discomfort currently.  Osteopenia of multiple sites - DEXA on 02/18/18: The BMD measured at Femur Total Right is 0.700 g/cm2 with a T-score of -2.4. Updated DEXA order placed by Dr. Redmond School.  Patient was advised to get DEXA.  Medical problems are listed as follows:  Vitamin D deficiency  Hypertension associated with diabetes (HCC)-increased risk of heart disease with rheumatoid arthritis was discussed.  Dietary modifications and exercise was discussed and the handout was placed in the AVS.  Senile purpura ([place])  Hyperlipidemia associated with type 2 diabetes mellitus ([place])  History of hypothyroidism  History of gastroesophageal reflux (GERD)  History of chronic pancreatitis  History of peptic ulcer disease  Former smoker  Orders: Orders Placed This Encounter  Procedures   BASIC METABOLIC PANEL WITH GFR    No orders of the defined types were placed in this encounter.    Follow-Up Instructions: Return in about 3 months (around 07/12/2021) for Rheumatoid arthritis.   [name], MD  Note - This record has been created using Editor, commissioning.  Chart creation errors have been sought, but may not always  have been located. Such creation errors do not reflect on  the standard of medical care.

## 2021-04-11 ENCOUNTER — Encounter: Payer: Self-pay | Admitting: Rheumatology

## 2021-04-11 ENCOUNTER — Other Ambulatory Visit: Payer: Self-pay

## 2021-04-11 ENCOUNTER — Ambulatory Visit: Payer: Medicare HMO | Admitting: Rheumatology

## 2021-04-11 VITALS — BP 127/80 | HR 65 | Ht 62.75 in | Wt 114.4 lb

## 2021-04-11 DIAGNOSIS — Z79899 Other long term (current) drug therapy: Secondary | ICD-10-CM | POA: Diagnosis not present

## 2021-04-11 DIAGNOSIS — D692 Other nonthrombocytopenic purpura: Secondary | ICD-10-CM

## 2021-04-11 DIAGNOSIS — Z8719 Personal history of other diseases of the digestive system: Secondary | ICD-10-CM

## 2021-04-11 DIAGNOSIS — M65341 Trigger finger, right ring finger: Secondary | ICD-10-CM | POA: Diagnosis not present

## 2021-04-11 DIAGNOSIS — E785 Hyperlipidemia, unspecified: Secondary | ICD-10-CM

## 2021-04-11 DIAGNOSIS — M8589 Other specified disorders of bone density and structure, multiple sites: Secondary | ICD-10-CM | POA: Diagnosis not present

## 2021-04-11 DIAGNOSIS — M5030 Other cervical disc degeneration, unspecified cervical region: Secondary | ICD-10-CM | POA: Diagnosis not present

## 2021-04-11 DIAGNOSIS — M65331 Trigger finger, right middle finger: Secondary | ICD-10-CM | POA: Diagnosis not present

## 2021-04-11 DIAGNOSIS — E1159 Type 2 diabetes mellitus with other circulatory complications: Secondary | ICD-10-CM

## 2021-04-11 DIAGNOSIS — E559 Vitamin D deficiency, unspecified: Secondary | ICD-10-CM

## 2021-04-11 DIAGNOSIS — M0600 Rheumatoid arthritis without rheumatoid factor, unspecified site: Secondary | ICD-10-CM

## 2021-04-11 DIAGNOSIS — Z87891 Personal history of nicotine dependence: Secondary | ICD-10-CM

## 2021-04-11 DIAGNOSIS — I152 Hypertension secondary to endocrine disorders: Secondary | ICD-10-CM

## 2021-04-11 DIAGNOSIS — Z8639 Personal history of other endocrine, nutritional and metabolic disease: Secondary | ICD-10-CM

## 2021-04-11 DIAGNOSIS — R7989 Other specified abnormal findings of blood chemistry: Secondary | ICD-10-CM

## 2021-04-11 DIAGNOSIS — Z8711 Personal history of peptic ulcer disease: Secondary | ICD-10-CM

## 2021-04-11 DIAGNOSIS — E1169 Type 2 diabetes mellitus with other specified complication: Secondary | ICD-10-CM

## 2021-04-11 LAB — BASIC METABOLIC PANEL WITH GFR
BUN/Creatinine Ratio: 19 (calc) (ref 6–22)
BUN: 21 mg/dL (ref 7–25)
CO2: 26 mmol/L (ref 20–32)
Calcium: 9.8 mg/dL (ref 8.6–10.4)
Chloride: 98 mmol/L (ref 98–110)
Creat: 1.12 mg/dL — ABNORMAL HIGH (ref 0.60–1.00)
Glucose, Bld: 170 mg/dL — ABNORMAL HIGH (ref 65–99)
Potassium: 5.2 mmol/L (ref 3.5–5.3)
Sodium: 133 mmol/L — ABNORMAL LOW (ref 135–146)
eGFR: 51 mL/min/{1.73_m2} — ABNORMAL LOW (ref >=60)

## 2021-04-11 NOTE — Patient Instructions (Signed)
Vaccines You are taking a medication(s) that can suppress your immune system.  The following immunizations are recommended: Flu annually Covid-19  Td/Tdap (tetanus, diphtheria, pertussis) every 10 years Pneumonia (Prevnar 15 then Pneumovax 23 at least 1 year apart.  Alternatively, can take Prevnar 20 without needing additional dose) Shingrix (after age 78): 2 doses from 4 weeks to 6 months apart  Please check with your PCP to make sure you are up to date.   Heart Disease Prevention   Your inflammatory disease increases your risk of heart disease which includes heart attack, stroke, atrial fibrillation (irregular heartbeats), high blood pressure, heart failure and atherosclerosis (plaque in the arteries).  It is important to reduce your risk by:   Keep blood pressure, cholesterol, and blood sugar at healthy levels   Smoking Cessation   Maintain a healthy weight  BMI 20-25   Eat a healthy diet  Plenty of fresh fruit, vegetables, and whole grains  Limit saturated fats, foods high in sodium, and added sugars  DASH and Mediterranean diet   Increase physical activity  Recommend moderate physically activity for 150 minutes per week/ 30 minutes a day for five days a week These can be broken up into three separate ten-minute sessions during the day.   Reduce Stress  Meditation, slow breathing exercises, yoga, coloring books  Dental visits twice a year

## 2021-04-12 NOTE — Progress Notes (Signed)
Glucose is elevated.  Creatinine is better.  Please forward labs to her PCP and nephrologist.

## 2021-04-13 ENCOUNTER — Telehealth: Payer: Self-pay

## 2021-04-13 DIAGNOSIS — M0600 Rheumatoid arthritis without rheumatoid factor, unspecified site: Secondary | ICD-10-CM

## 2021-04-13 NOTE — Telephone Encounter (Signed)
Patient advised we will need a blood hydroxychloroquine level, 4 hours after the hydroxychloroquine dose to increase the dose of the hydroxychloroquine. Future order placed for Hydroxychloroquine level. Patient plans to come in tomorrow.

## 2021-04-13 NOTE — Telephone Encounter (Signed)
Patient called stating Dr. Estanislado Pandy decreased her dosage for Hydroxychloroquine to 1/2 pill by mouth daily.  Patient states for the past 4 weeks has experienced increased pain in her right hand and fingers.  Patient requested a return call to let her know if she can increase her dosage back to 1 pill.

## 2021-04-13 NOTE — Telephone Encounter (Signed)
We will need a blood hydroxychloroquine level, 4 hours after the hydroxychloroquine dose to increase the dose of the hydroxychloroquine.

## 2021-04-14 ENCOUNTER — Other Ambulatory Visit: Payer: Self-pay | Admitting: *Deleted

## 2021-04-14 DIAGNOSIS — M0600 Rheumatoid arthritis without rheumatoid factor, unspecified site: Secondary | ICD-10-CM

## 2021-04-24 ENCOUNTER — Other Ambulatory Visit: Payer: Self-pay | Admitting: *Deleted

## 2021-04-24 DIAGNOSIS — M0600 Rheumatoid arthritis without rheumatoid factor, unspecified site: Secondary | ICD-10-CM

## 2021-04-24 LAB — HYDROXYCHLOROQUINE,BLOOD: HYDROXYCHLOROQUINE, (B): 440 ng/mL — ABNORMAL HIGH

## 2021-04-24 MED ORDER — HYDROXYCHLOROQUINE SULFATE 200 MG PO TABS
ORAL_TABLET | ORAL | 2 refills | Status: DC
Start: 2021-04-24 — End: 2021-06-20

## 2021-04-24 NOTE — Progress Notes (Signed)
Please let patient know that we will increase her dose of hydroxychloroquine to 1 tablet p.o. Monday to Friday.  Please send a new prescription if needed.

## 2021-04-24 NOTE — Progress Notes (Signed)
Hydroxychloroquine level is below therapeutic.  She is on low-dose hydroxychloroquine due to low GFR.  She was clinically doing well.  We will continue current dose of hydroxychloroquine.

## 2021-06-06 ENCOUNTER — Telehealth: Payer: Self-pay | Admitting: Family Medicine

## 2021-06-06 NOTE — Chronic Care Management (AMB) (Signed)
  Chronic Care Management   Note  06/06/2021 Name: Deborah Travis MRN: [id number] DOB: 05-27-1941  [name] is a 80 y.o. year old female who is a primary care patient of Redmond School, Elyse Jarvis, MD. I reached out to [name] by phone today in response to a referral sent by [name]'s PCP, [name], MD.   [name] was given information about Chronic Care Management services today including:  CCM service includes personalized support from designated clinical staff supervised by her physician, including individualized plan of care and coordination with other care providers 24/7 contact phone numbers for assistance for urgent and routine care needs. Service will only be billed when office clinical staff spend 20 minutes or more in a month to coordinate care. Only one practitioner may furnish and bill the service in a calendar month. The patient may stop CCM services at any time (effective at the end of the month) by phone call to the office staff.   Patient agreed to services and verbal consent obtained.   Follow up plan:   [place]ana Secretary/administrator

## 2021-06-20 ENCOUNTER — Other Ambulatory Visit: Payer: Self-pay | Admitting: Physician Assistant

## 2021-06-20 ENCOUNTER — Other Ambulatory Visit: Payer: Self-pay | Admitting: Family Medicine

## 2021-06-20 DIAGNOSIS — E118 Type 2 diabetes mellitus with unspecified complications: Secondary | ICD-10-CM

## 2021-06-20 DIAGNOSIS — E119 Type 2 diabetes mellitus without complications: Secondary | ICD-10-CM

## 2021-06-20 DIAGNOSIS — E1169 Type 2 diabetes mellitus with other specified complication: Secondary | ICD-10-CM

## 2021-06-20 DIAGNOSIS — I152 Hypertension secondary to endocrine disorders: Secondary | ICD-10-CM

## 2021-06-20 DIAGNOSIS — E785 Hyperlipidemia, unspecified: Secondary | ICD-10-CM

## 2021-06-20 DIAGNOSIS — M0600 Rheumatoid arthritis without rheumatoid factor, unspecified site: Secondary | ICD-10-CM

## 2021-06-20 DIAGNOSIS — E1159 Type 2 diabetes mellitus with other circulatory complications: Secondary | ICD-10-CM

## 2021-06-20 NOTE — Telephone Encounter (Signed)
Next Visit: 07/11/2021  Last Visit: 04/11/2021  Labs: 04/11/2021 Glucose is elevated.  Creatinine is better. 01/27/2021 Glucose is high.  Please notify patient and her PCPs office.  Creatinine is elevated which could be due to the use of ACE inhibitor's.  Eye exam: 09/19/2020 WNL    Current Dose per office note 04/11/2021: Arava 20 mg 1 tablet by mouth daily.  I may reduce her dose of leflunomide to 10 mg p.o. daily as she did not have any synovitis. Per lab note 04/14/2021: hydroxychloroquine to 1 tablet p.o. Monday to Friday  [id number] rheumatoid arthritis   Last Fill: 04/24/2021 (PLQ), 01/19/2021 Jolee Ewing)  Attempted to contact the patient and left message to advise patient she is due to update labs.   Okay to refill Plaquenil?

## 2021-06-23 ENCOUNTER — Ambulatory Visit (INDEPENDENT_AMBULATORY_CARE_PROVIDER_SITE_OTHER): Payer: Medicare HMO | Admitting: Family Medicine

## 2021-06-23 ENCOUNTER — Other Ambulatory Visit: Payer: Self-pay

## 2021-06-23 ENCOUNTER — Encounter: Payer: Self-pay | Admitting: Family Medicine

## 2021-06-23 VITALS — BP 118/70 | HR 75 | Temp 97.5°F | Wt 115.0 lb

## 2021-06-23 DIAGNOSIS — K8681 Exocrine pancreatic insufficiency: Secondary | ICD-10-CM

## 2021-06-23 DIAGNOSIS — E118 Type 2 diabetes mellitus with unspecified complications: Secondary | ICD-10-CM | POA: Diagnosis not present

## 2021-06-23 DIAGNOSIS — Z90410 Acquired total absence of pancreas: Secondary | ICD-10-CM

## 2021-06-23 DIAGNOSIS — M8589 Other specified disorders of bone density and structure, multiple sites: Secondary | ICD-10-CM

## 2021-06-23 DIAGNOSIS — H409 Unspecified glaucoma: Secondary | ICD-10-CM | POA: Diagnosis not present

## 2021-06-23 DIAGNOSIS — I152 Hypertension secondary to endocrine disorders: Secondary | ICD-10-CM

## 2021-06-23 DIAGNOSIS — Z8711 Personal history of peptic ulcer disease: Secondary | ICD-10-CM

## 2021-06-23 DIAGNOSIS — E039 Hypothyroidism, unspecified: Secondary | ICD-10-CM

## 2021-06-23 DIAGNOSIS — Z9049 Acquired absence of other specified parts of digestive tract: Secondary | ICD-10-CM

## 2021-06-23 DIAGNOSIS — M0600 Rheumatoid arthritis without rheumatoid factor, unspecified site: Secondary | ICD-10-CM | POA: Diagnosis not present

## 2021-06-23 DIAGNOSIS — E1159 Type 2 diabetes mellitus with other circulatory complications: Secondary | ICD-10-CM

## 2021-06-23 DIAGNOSIS — I700 Atherosclerosis of aorta: Secondary | ICD-10-CM

## 2021-06-23 DIAGNOSIS — Z23 Encounter for immunization: Secondary | ICD-10-CM

## 2021-06-23 LAB — POCT GLYCOSYLATED HEMOGLOBIN (HGB A1C): Hemoglobin A1C: 7 % — AB (ref 4.0–5.6)

## 2021-06-23 NOTE — Progress Notes (Signed)
Subjective:    Patient ID: Deborah Travis, female    DOB: 16-Jan-1941, 80 y.o.   MRN: [id number]  Deborah Travis is a 80 y.o. female who presents for follow-up of Type 2 diabetes mellitus.  Home blood sugar records:  fasting lowest reading 86 highest reading 125 Current symptoms/problems include none and have been improving. Daily foot checks: yes  Any foot concerns: none  Exercise:  staying active  Diet: good She continues to be followed by Dr. Bennie Dallas for her underlying rheumatoid arthritis.  She is on hydroxychloroquine and is having some difficulty with trigger finger.  She also has a history of osteopenia and has been take vitamin D and calcium.  She does have pancreatic insufficiency and is using Zenpep for that.  She is doing quite nicely on that.  She has remote history of difficulty with pancreatitis and subsequent Whipple procedure.  Continues on metformin for her diabetes and has cut back on her carbohydrates which has helped.  Continues on lisinopril as well as pravastatin.  Does occasionally use Prilosec.  Continues on Synthroid without difficulty.  She does have a history of aortic atherosclerosis.  She also has a history of glaucoma and plans to follow-up with ophthalmology beginning of next year. The following portions of the patient's history were reviewed and updated as appropriate: allergies, current medications, past medical history, past social history and problem list.  ROS as in subjective above.     Objective:    Physical Exam Alert and in no distress otherwise not examined. Hemoglobin A1c is 7.0 Blood pressure 118/70, pulse 75, temperature (!) 97.5 F (36.4 C), weight 115 lb (52.2 kg), SpO2 97 %.  Lab Review Diabetic Labs Latest Ref Rng & Units 06/23/2021 04/11/2021 03/13/2021 01/27/2021 01/25/2021  HbA1c 4.0 - 5.6 % 7.0(A) - 8.5(A) - 11.5(A)  Microalbumin mg/L - - - - -  Micro/Creat Ratio - - - - - -  Chol 100 - 199 mg/dL - - - - -  HDL >39 mg/dL - - - - -  Calc  LDL 0 - 99 mg/dL - - - - -  Triglycerides 0 - 149 mg/dL - - - - -  Creatinine 0.60 - 1.00 mg/dL - 1.12(H) - 1.46(H) -   BP/Weight 06/23/2021 04/11/2021 03/13/2021 0/27/7412 04/03/8675  Systolic BP 720 947 096 - 283  Diastolic BP 70 80 70 - 72  Wt. (Lbs) 115 114.4 111.6 110 110  BMI 20.53 20.43 19.93 19.64 19.64   Foot/eye exam completion dates Latest Ref Rng & Units 01/25/2021 09/19/2020  Eye Exam No Retinopathy - Retinopathy(A)  Foot Form Completion - Done -    Deborah Travis  reports that she quit smoking about 5 years ago. Her smoking use included cigarettes. She smoked an average of .1 packs per day. She has never used smokeless tobacco. She reports that she does not drink alcohol and does not use drugs.     Assessment & Plan:    Type 2 diabetes with complication ([place]) - Plan: POCT glycosylated hemoglobin (Hb A1C)  Need for influenza vaccination - Plan: Flu Vaccine QUAD High Dose(Fluad)  Hypertension associated with diabetes (HCC)  Seronegative rheumatoid arthritis (HCC)  Exocrine pancreatic insufficiency  Hypothyroidism, unspecified type  Osteopenia of multiple sites - Plan: DG Bone Density  Glaucoma of both eyes, unspecified glaucoma type  History of peptic ulcer disease  H/O Whipple procedure - Chronic pancreatitis  Aortic atherosclerosis ([place])  Rx changes: none Education: Reviewed 'ABCs' of diabetes management (respective goals in  parentheses):  A1C (<7), blood pressure (<130/80), and cholesterol (LDL <100). Compliance at present is estimated to be good. Efforts to improve compliance (if necessary) will be directed at  no change . Follow up: 5 months

## 2021-06-27 NOTE — Progress Notes (Deleted)
Office Visit Note  Patient: Deborah Travis             Date of Birth: 11-16-1940           MRN: [id number]             PCP: [name], MD Referring: [name], MD Visit Date: 07/11/2021 Occupation: @GUAROCC @  Subjective:  No chief complaint on file.   History of Present Illness: Deborah Travis is a 80 y.o. female ***   Activities of Daily Living:  Patient reports morning stiffness for *** {minute/hour:19697}.   Patient {ACTIONS;DENIES/REPORTS:21021675::"Denies"} nocturnal pain.  Difficulty dressing/grooming: {ACTIONS;DENIES/REPORTS:21021675::"Denies"} Difficulty climbing stairs: {ACTIONS;DENIES/REPORTS:21021675::"Denies"} Difficulty getting out of chair: {ACTIONS;DENIES/REPORTS:21021675::"Denies"} Difficulty using hands for taps, buttons, cutlery, and/or writing: {ACTIONS;DENIES/REPORTS:21021675::"Denies"}  No Rheumatology ROS completed.   PMFS History:  Patient Active Problem List   Diagnosis Date Noted   Aortic atherosclerosis ([place]) 06/23/2021   Diabetic nephropathy associated with type 2 diabetes mellitus ([place]) 02/06/2021   Stage 3b chronic kidney disease ([place]) 02/06/2021   Chronic calcific pancreatitis ([place]) 08/12/2020   Weight loss 12/18/2019   Abdominal tenderness 12/18/2019   History of pancreatitis 01/20/2019   Atherosclerosis 08/08/2018   Glaucoma 05/26/2018   Hypertensive retinopathy 05/26/2018   Seronegative rheumatoid arthritis ([place]) 03/04/2018   Senile purpura ([place]) 02/01/2015   Former smoker 10/12/2013   History of peptic ulcer disease 07/13/2013   Osteopenia 05/07/2013   Hypothyroidism 01/12/2012   Type 2 diabetes with complication ([place]) 54/62/7035   Hypertension associated with diabetes ([place]) 05/21/2011   Exocrine pancreatic insufficiency 05/21/2011   Hyperlipidemia associated with type 2 diabetes mellitus ([place]) 05/21/2011    Past Medical History:  Diagnosis Date   Chronic kidney disease    RENAL STONE   Chronic pancreatitis ([place])     Diabetes mellitus    Duodenal ulcer    Dyslipidemia    GERD (gastroesophageal reflux disease)    Hypertension    Osteopenia    Thyroid disease    HYPOTHYROID   Vitamin D deficiency     Family History  Problem Relation Age of Onset   Stroke Mother    Heart disease Father    Healthy Daughter    Healthy Daughter    Past Surgical History:  Procedure Laterality Date   APPENDECTOMY     CATARACT EXTRACTION Right 2014   CHOLECYSTECTOMY     TONSILECTOMY, ADENOIDECTOMY, BILATERAL MYRINGOTOMY AND TUBES     WHIPPLE PROCEDURE  1998   Social History   Social History Narrative   Not on file   Immunization History  Administered Date(s) Administered   Fluad Quad(high Dose 65+) 06/01/2019, 05/10/2020, 06/23/2021   Influenza Split 05/21/2011, 06/12/2012, 07/15/2013   Influenza, High Dose Seasonal PF 07/13/2013, 07/01/2014, 06/01/2015, 06/15/2016, 04/15/2017, 05/19/2018   PFIZER Comirnaty(Gray Top)Covid-19 Tri-Sucrose Vaccine 01/25/2021   PFIZER(Purple Top)SARS-COV-2 Vaccination 09/18/2019, 10/09/2019   Pneumococcal Conjugate-13 06/15/2016   Pneumococcal Polysaccharide-23 06/12/2012   Tdap 06/28/2011   Zoster, Live 06/27/2013     Objective: Vital Signs: LMP  (LMP Unknown)    Physical Exam   Musculoskeletal Exam: ***  CDAI Exam: CDAI Score: -- Patient Global: --; Provider Global: -- Swollen: --; Tender: -- Joint Exam 07/11/2021   No joint exam has been documented for this visit   There is currently no information documented on the homunculus. Go to the Rheumatology activity and complete the homunculus joint exam.  Investigation: No additional findings.  Imaging: No results found.  Recent Labs: Lab Results  Component Value Date  WBC 6.1 01/27/2021   HGB 12.8 01/27/2021   PLT 289 01/27/2021   NA 133 (L) 04/11/2021   K 5.2 04/11/2021   CL 98 04/11/2021   CO2 26 04/11/2021   GLUCOSE 170 (H) 04/11/2021   BUN 21 04/11/2021   CREATININE 1.12 (H) 04/11/2021    BILITOT 0.4 01/27/2021   ALKPHOS 77 03/09/2020   AST 21 01/27/2021   ALT 21 01/27/2021   PROT 7.1 01/27/2021   ALBUMIN 3.4 (L) 03/09/2020   CALCIUM 9.8 04/11/2021   GFRAA 40 (L) 01/27/2021   QFTBGOLDPLUS NEGATIVE 03/30/2019    Speciality Comments: PLQ Eye Exam: 09/19/2020 WNL @ [place] Follow up in 6 months   Procedures:  No procedures performed Allergies: Levofloxacin and Codeine   Assessment / Plan:     Visit Diagnoses: Seronegative rheumatoid arthritis ([place])  High risk medication use  Trigger finger, right middle finger  Trigger finger, right ring finger  DDD (degenerative disc disease), cervical  Osteopenia of multiple sites  Vitamin D deficiency  Hypertension associated with diabetes ([place])  Senile purpura ([place])  Hyperlipidemia associated with type 2 diabetes mellitus ([place])  History of hypothyroidism  History of gastroesophageal reflux (GERD)  History of chronic pancreatitis  History of peptic ulcer disease  Former smoker  Orders: No orders of the defined types were placed in this encounter.  No orders of the defined types were placed in this encounter.   Face-to-face time spent with patient was *** minutes. Greater than 50% of time was spent in counseling and coordination of care.  Follow-Up Instructions: No follow-ups on file.   Ofilia Neas, PA-C  Note - This record has been created using Dragon software.  Chart creation errors have been sought, but may not always  have been located. Such creation errors do not reflect on  the standard of medical care.

## 2021-07-02 ENCOUNTER — Other Ambulatory Visit: Payer: Self-pay | Admitting: Family Medicine

## 2021-07-06 ENCOUNTER — Telehealth (INDEPENDENT_AMBULATORY_CARE_PROVIDER_SITE_OTHER): Payer: Medicare HMO | Admitting: Family Medicine

## 2021-07-06 ENCOUNTER — Other Ambulatory Visit: Payer: Self-pay

## 2021-07-06 ENCOUNTER — Encounter: Payer: Self-pay | Admitting: Family Medicine

## 2021-07-06 VITALS — BP 124/78 | HR 107 | Temp 100.4°F | Wt 111.6 lb

## 2021-07-06 DIAGNOSIS — J111 Influenza due to unidentified influenza virus with other respiratory manifestations: Secondary | ICD-10-CM | POA: Diagnosis not present

## 2021-07-06 LAB — POCT INFLUENZA A/B
Influenza A, POC: NEGATIVE
Influenza B, POC: NEGATIVE

## 2021-07-06 LAB — POC COVID19 BINAXNOW: SARS Coronavirus 2 Ag: NEGATIVE

## 2021-07-06 MED ORDER — OSELTAMIVIR PHOSPHATE 75 MG PO CAPS: 75.0000 mg | ORAL_CAPSULE | Freq: Two times a day (BID) | ORAL | 0 refills | Status: DC

## 2021-07-06 NOTE — Patient Instructions (Signed)

## 2021-07-06 NOTE — Progress Notes (Signed)
   Subjective:    Patient ID: Deborah Travis, female    DOB: March 10, 1941, 80 y.o.   MRN: [id number]  HPI She complains of a 2-day history of cough, chest tightness, myalgias, fever, chills.  She has been exposed to the flu recently.   Review of Systems     Objective:   Physical Exam Alert and toxic appearing.  TMs clear.  Throat is clear.  Neck is supple without adenopathy.  Cardiac and lung exam is normal.  Testing was negative        Assessment & Plan:  Influenza - Plan: oseltamivir (TAMIFLU) 75 MG capsule, POC COVID-19 BinaxNow, POCT Influenza A/B Clinically she has the flu and I think is appropriate to treat her with her underlying medical conditions.  She was comfortable with that.  Recommend fluids and Tylenol for pain relief.

## 2021-07-10 ENCOUNTER — Telehealth: Payer: Self-pay | Admitting: Family Medicine

## 2021-07-10 NOTE — Telephone Encounter (Signed)
Pt called and states that she thinks on top of the flu she might have a sinus infection. She also states that she is coughing so bad at night she can't sleep. She is requesting something for a sinus infection and cough. Please send to [facility]. PT can be reached at [phone].

## 2021-07-11 ENCOUNTER — Telehealth: Payer: Self-pay

## 2021-07-11 ENCOUNTER — Ambulatory Visit: Payer: Medicare HMO | Admitting: Physician Assistant

## 2021-07-11 DIAGNOSIS — M0600 Rheumatoid arthritis without rheumatoid factor, unspecified site: Secondary | ICD-10-CM

## 2021-07-11 DIAGNOSIS — E1169 Type 2 diabetes mellitus with other specified complication: Secondary | ICD-10-CM

## 2021-07-11 DIAGNOSIS — Z79899 Other long term (current) drug therapy: Secondary | ICD-10-CM

## 2021-07-11 DIAGNOSIS — Z8719 Personal history of other diseases of the digestive system: Secondary | ICD-10-CM

## 2021-07-11 DIAGNOSIS — M65341 Trigger finger, right ring finger: Secondary | ICD-10-CM

## 2021-07-11 DIAGNOSIS — E559 Vitamin D deficiency, unspecified: Secondary | ICD-10-CM

## 2021-07-11 DIAGNOSIS — Z87891 Personal history of nicotine dependence: Secondary | ICD-10-CM

## 2021-07-11 DIAGNOSIS — Z8639 Personal history of other endocrine, nutritional and metabolic disease: Secondary | ICD-10-CM

## 2021-07-11 DIAGNOSIS — M65331 Trigger finger, right middle finger: Secondary | ICD-10-CM

## 2021-07-11 DIAGNOSIS — I152 Hypertension secondary to endocrine disorders: Secondary | ICD-10-CM

## 2021-07-11 DIAGNOSIS — M5030 Other cervical disc degeneration, unspecified cervical region: Secondary | ICD-10-CM

## 2021-07-11 DIAGNOSIS — Z8711 Personal history of peptic ulcer disease: Secondary | ICD-10-CM

## 2021-07-11 DIAGNOSIS — M8589 Other specified disorders of bone density and structure, multiple sites: Secondary | ICD-10-CM

## 2021-07-11 DIAGNOSIS — D692 Other nonthrombocytopenic purpura: Secondary | ICD-10-CM

## 2021-07-11 MED ORDER — AMOXICILLIN-POT CLAVULANATE 875-125 MG PO TABS: 1.0000 | ORAL_TABLET | Freq: Two times a day (BID) | ORAL | 0 refills | Status: DC

## 2021-07-11 MED ORDER — BENZONATATE 100 MG PO CAPS: 200.0000 mg | ORAL_CAPSULE | Freq: Two times a day (BID) | ORAL | 0 refills | Status: DC | PRN

## 2021-07-11 NOTE — Telephone Encounter (Signed)
Lvm advising med was called in for her . Deborah Travis

## 2021-07-11 NOTE — Telephone Encounter (Signed)
Pt called back said hadn't heard back and I advised her Dr. Redmond School had just sent in antibiotic and cough medicine to pharmacy.

## 2021-07-13 NOTE — Telephone Encounter (Signed)
LVM advising pt of med at pharmacy. 2nd call [facility]

## 2021-07-18 NOTE — Progress Notes (Signed)
Office Visit Note  Patient: Deborah Travis             Date of Birth: 1940/12/17           MRN: [id number]             PCP: [name], MD Referring: [name], MD Visit Date: 08/01/2021 Occupation: @GUAROCC @  Subjective:  Right hand pain   History of Present Illness: KIYLAH LOYER is a 80 y.o. female with history of seronegative rheumatoid arthritis, DDD, and osteopenia. She is taking arava 20 mg 1 tablet by mouth daily and plaquenil 200 mg 1 tablet by mouth daily Monday through Friday only.  Hydroxychloroquine blood level was 440 on 04/14/21 at which time she was advised to increase plaquenil to 1 tablet by mouth daily Monday through Friday.  She reports that Dr. Estanislado Pandy told her separately that she was to increase the dose of Plaquenil to 1 tablet Monday through Friday and half tablet on Saturday and Sunday.  She has been tolerating both medications without any side effects and has not missed any doses recently.  She states that the swelling in her hands has improved.  She continues to experience locking in the right fourth and fifth fingers.  She states that the pain has improved.  She denies any other joint pain or inflammation at this time.    Activities of Daily Living:  Patient reports morning stiffness for 0 minutes.   Patient Denies nocturnal pain.  Difficulty dressing/grooming: Reports Difficulty climbing stairs: Denies Difficulty getting out of chair: Denies Difficulty using hands for taps, buttons, cutlery, and/or writing: Reports  Review of Systems  Constitutional:  Positive for fatigue.  HENT:  Negative for mouth sores, mouth dryness and nose dryness.   Eyes:  Negative for pain, itching and dryness.  Respiratory:  Negative for shortness of breath and difficulty breathing.   Cardiovascular:  Negative for chest pain and palpitations.  Gastrointestinal:  Negative for blood in stool, constipation and diarrhea.  Endocrine: Negative for increased urination.   Genitourinary:  Negative for difficulty urinating.  Musculoskeletal:  Positive for joint pain and joint pain. Negative for joint swelling, myalgias, morning stiffness, muscle tenderness and myalgias.  Skin:  Negative for color change, rash and redness.  Allergic/Immunologic: Negative for susceptible to infections.  Neurological:  Negative for dizziness, numbness, headaches, memory loss and weakness.  Hematological:  Positive for bruising/bleeding tendency.  Psychiatric/Behavioral:  Negative for confusion.    PMFS History:  Patient Active Problem List   Diagnosis Date Noted   Aortic atherosclerosis ([place]) 06/23/2021   Diabetic nephropathy associated with type 2 diabetes mellitus ([place]) 02/06/2021   Stage 3b chronic kidney disease ([place]) 02/06/2021   Chronic calcific pancreatitis ([place]) 08/12/2020   Weight loss 12/18/2019   Abdominal tenderness 12/18/2019   History of pancreatitis 01/20/2019   Atherosclerosis 08/08/2018   Glaucoma 05/26/2018   Hypertensive retinopathy 05/26/2018   Seronegative rheumatoid arthritis ([place]) 03/04/2018   Senile purpura ([place]) 02/01/2015   Former smoker 10/12/2013   History of peptic ulcer disease 07/13/2013   Osteopenia 05/07/2013   Hypothyroidism 01/12/2012   Type 2 diabetes with complication ([place]) 61/60/7371   Hypertension associated with diabetes ([place]) 05/21/2011   Exocrine pancreatic insufficiency 05/21/2011   Hyperlipidemia associated with type 2 diabetes mellitus ([place]) 05/21/2011    Past Medical History:  Diagnosis Date   Chronic kidney disease    RENAL STONE   Chronic pancreatitis ([facility])    Diabetes mellitus  Duodenal ulcer    Dyslipidemia    GERD (gastroesophageal reflux disease)    Hypertension    Osteopenia    Thyroid disease    HYPOTHYROID   Vitamin D deficiency     Family History  Problem Relation Age of Onset   Stroke Mother    Heart disease Father    Healthy Daughter    Healthy Daughter    Past Surgical History:  Procedure  Laterality Date   APPENDECTOMY     CATARACT EXTRACTION Right 2014   CHOLECYSTECTOMY     TONSILECTOMY, ADENOIDECTOMY, BILATERAL MYRINGOTOMY AND TUBES     WHIPPLE PROCEDURE  1998   Social History   Social History Narrative   Not on file   Immunization History  Administered Date(s) Administered   Fluad Quad(high Dose 65+) 06/01/2019, 05/10/2020, 06/23/2021   Influenza Split 05/21/2011, 06/12/2012, 07/15/2013   Influenza, High Dose Seasonal PF 07/13/2013, 07/01/2014, 06/01/2015, 06/15/2016, 04/15/2017, 05/19/2018   PFIZER Comirnaty(Gray Top)Covid-19 Tri-Sucrose Vaccine 01/25/2021   PFIZER(Purple Top)SARS-COV-2 Vaccination 09/18/2019, 10/09/2019   Pneumococcal Conjugate-13 06/15/2016   Pneumococcal Polysaccharide-23 06/12/2012   Tdap 06/28/2011   Zoster, Live 06/27/2013     Objective: Vital Signs: BP 105/71 (BP Location: Left Arm, Patient Position: Sitting, Cuff Size: Normal)   Pulse 80   Ht 5' 2.75" (1.594 m)   Wt 107 lb 9.6 oz (48.8 kg)   LMP  (LMP Unknown)   BMI 19.21 kg/m    Physical Exam Vitals and nursing note reviewed.  Constitutional:      Appearance: She is well-developed.  HENT:     Head: Normocephalic and atraumatic.  Eyes:     Conjunctiva/sclera: Conjunctivae normal.  Cardiovascular:     Rate and Rhythm: Normal rate and regular rhythm.     Heart sounds: Normal heart sounds.  Pulmonary:     Effort: Pulmonary effort is normal.     Breath sounds: Normal breath sounds.  Abdominal:     General: Bowel sounds are normal.     Palpations: Abdomen is soft.  Musculoskeletal:     Cervical back: Normal range of motion.  Lymphadenopathy:     Cervical: No cervical adenopathy.  Skin:    General: Skin is warm and dry.     Capillary Refill: Capillary refill takes less than 2 seconds.  Neurological:     Mental Status: She is alert and oriented to person, place, and time.  Psychiatric:        Behavior: Behavior normal.     Musculoskeletal Exam: C-spine is slightly  limited range of motion with lateral rotation.  No midline spinal tenderness or SI joint tenderness.  Shoulder joints, elbow joints, wrist joints, MCPs, PIPs, DIPs have good range of motion with no synovitis.  Right fourth and fifth trigger fingers noted.  PIP and DIP thickening consistent with osteoarthritis of both hands.  Hip joints have good range of motion with no groin pain.  Knee joints have good range of motion with no warmth or effusion.  Ankle joints have good range of motion with no tenderness or joint swelling.  CDAI Exam: CDAI Score: 0.8  Patient Global: 4 mm; Provider Global: 4 mm Swollen: 0 ; Tender: 0  Joint Exam 08/01/2021   No joint exam has been documented for this visit   There is currently no information documented on the homunculus. Go to the Rheumatology activity and complete the homunculus joint exam.  Investigation: No additional findings.  Imaging: No results found.  Recent Labs: Lab Results  Component Value  Date   WBC 6.1 01/27/2021   HGB 12.8 01/27/2021   PLT 289 01/27/2021   NA 133 (L) 04/11/2021   K 5.2 04/11/2021   CL 98 04/11/2021   CO2 26 04/11/2021   GLUCOSE 170 (H) 04/11/2021   BUN 21 04/11/2021   CREATININE 1.12 (H) 04/11/2021   BILITOT 0.4 01/27/2021   ALKPHOS 77 03/09/2020   AST 21 01/27/2021   ALT 21 01/27/2021   PROT 7.1 01/27/2021   ALBUMIN 3.4 (L) 03/09/2020   CALCIUM 9.8 04/11/2021   GFRAA 40 (L) 01/27/2021   QFTBGOLDPLUS NEGATIVE 03/30/2019    Speciality Comments: PLQ Eye Exam: 09/19/2020 WNL @ [facility] Follow up in 6 months   Procedures:  No procedures performed Allergies: Levofloxacin and Codeine    Assessment / Plan:     Visit Diagnoses: Seronegative rheumatoid arthritis ([place]) - RF-,CCP-, 14-3-3 eta negative, Uric acid 4.8, ANA negative, no erosive changes on XR of hands: She has no joint tenderness or synovitis on examination.  She has not had any signs or symptoms of a rheumatoid arthritis flare.  She is  clinically doing well taking Arava 20 mg 1 tablet by mouth daily and Plaquenil 200 mg 1 tablet by mouth daily Monday through Friday and 1/2 tablet on Saturday and Sundays.  Hydroxychloroquine blood level was checked on 04/14/2021 which was 440.  At that time there is documentation from Dr. Estanislado Pandy to increase the dose of hydroxychloroquine to 1 tablet daily Monday through Friday.  According to the patient she was told by Dr. Estanislado Pandy to take 1 tablet daily Monday through Friday and 1/2 tablet Saturday and Sundays.  I do not see documentation of this dosing.  She has not missed any doses of Plaquenil or Arava recently.  She is tolerating both medications without any side effects.  Hydroxychloroquine blood level, CBC, and CMP will be updated today.  We will make further recommendations about the dosing once we have lab results completed.  She will follow-up in the office  High risk medication use - Arava 20 mg 1 tablet by mouth daily, Plaquenil 200 mg 1 tablet by mouth daily Monday through Friday and half tablet on Saturday and sundays.  PLQ Eye Exam: 09/19/2020 WNL @ [facility] Follow up in 6 months.   She was given a plaquenil eye exam form to take with her to her next appointment.  CBC and CMP are due today.  Hydroxychloroquine level will be updated today as well. - Plan: CBC with Differential/Platelet, COMPLETE METABOLIC PANEL WITH GFR, Hydroxychloroquine, Blood She was diagnosed with the flu on 07/04/2021.  Her symptoms have completely resolved.  Discussed the importance of holding [place] if she develops signs or symptoms of an infection and to resume once the infection has completely cleared.  Trigger finger, right ring finger: Different treatment options were discussed.   Trigger little finger of right hand: Different treatment options were discussed today in detail.  She has some tenderness palpation and has been experiencing locking intermittently.  Her symptoms have been tolerable.  She declined  scheduling an ultrasound-guided injection at this time.  Discussed buddy taping the finger to the adjacent digit or trying a splint.   DDD (degenerative disc disease), cervical: C-spine is slightly limited range of motion with lateral rotation.  No symptoms of radiculopathy at this time.  Osteopenia of multiple sites - DEXA on 02/18/18: The BMD measured at Femur Total Right is 0.700 g/cm2 with a T-score of -2.4. Updated DEXA order placed  by Dr. Redmond School.  Postural thoracic kyphosis noted.  No vertebral spinal tenderness noted.  She is taking a calcium and vitamin D supplement daily.  No recent falls or fractures.  Vitamin D deficiency: She is taking vitamin D 2000 units daily.  Other medical conditions are listed as follows:   Hypertension associated with diabetes ([place])  Senile purpura ([place])  Hyperlipidemia associated with type 2 diabetes mellitus ([place])  History of hypothyroidism  History of gastroesophageal reflux (GERD)  History of chronic pancreatitis  History of peptic ulcer disease  Former smoker  Orders: Orders Placed This Encounter  Procedures   CBC with Differential/Platelet   COMPLETE METABOLIC PANEL WITH GFR   Hydroxychloroquine, Blood   No orders of the defined types were placed in this encounter.    Follow-Up Instructions: Return in about 3 months (around 10/30/2021) for Rheumatoid arthritis, DDD.   Ofilia Neas, PA-C  Note - This record has been created using Dragon software.  Chart creation errors have been sought, but may not always  have been located. Such creation errors do not reflect on  the standard of medical care.

## 2021-07-19 ENCOUNTER — Ambulatory Visit: Payer: Medicare HMO | Admitting: Physician Assistant

## 2021-07-31 ENCOUNTER — Other Ambulatory Visit: Payer: Self-pay | Admitting: Rheumatology

## 2021-07-31 DIAGNOSIS — M0600 Rheumatoid arthritis without rheumatoid factor, unspecified site: Secondary | ICD-10-CM

## 2021-08-01 ENCOUNTER — Encounter: Payer: Self-pay | Admitting: Physician Assistant

## 2021-08-01 ENCOUNTER — Telehealth: Payer: Self-pay | Admitting: Pharmacist

## 2021-08-01 ENCOUNTER — Other Ambulatory Visit: Payer: Self-pay

## 2021-08-01 ENCOUNTER — Ambulatory Visit: Payer: Medicare HMO | Admitting: Physician Assistant

## 2021-08-01 VITALS — BP 105/71 | HR 80 | Ht 62.75 in | Wt 107.6 lb

## 2021-08-01 DIAGNOSIS — M0600 Rheumatoid arthritis without rheumatoid factor, unspecified site: Secondary | ICD-10-CM

## 2021-08-01 DIAGNOSIS — Z8639 Personal history of other endocrine, nutritional and metabolic disease: Secondary | ICD-10-CM

## 2021-08-01 DIAGNOSIS — Z87891 Personal history of nicotine dependence: Secondary | ICD-10-CM

## 2021-08-01 DIAGNOSIS — D692 Other nonthrombocytopenic purpura: Secondary | ICD-10-CM | POA: Diagnosis not present

## 2021-08-01 DIAGNOSIS — M8589 Other specified disorders of bone density and structure, multiple sites: Secondary | ICD-10-CM | POA: Diagnosis not present

## 2021-08-01 DIAGNOSIS — Z8719 Personal history of other diseases of the digestive system: Secondary | ICD-10-CM

## 2021-08-01 DIAGNOSIS — E1169 Type 2 diabetes mellitus with other specified complication: Secondary | ICD-10-CM

## 2021-08-01 DIAGNOSIS — I152 Hypertension secondary to endocrine disorders: Secondary | ICD-10-CM

## 2021-08-01 DIAGNOSIS — M5030 Other cervical disc degeneration, unspecified cervical region: Secondary | ICD-10-CM

## 2021-08-01 DIAGNOSIS — M65341 Trigger finger, right ring finger: Secondary | ICD-10-CM

## 2021-08-01 DIAGNOSIS — M65351 Trigger finger, right little finger: Secondary | ICD-10-CM

## 2021-08-01 DIAGNOSIS — E1159 Type 2 diabetes mellitus with other circulatory complications: Secondary | ICD-10-CM

## 2021-08-01 DIAGNOSIS — M65331 Trigger finger, right middle finger: Secondary | ICD-10-CM

## 2021-08-01 DIAGNOSIS — Z79899 Other long term (current) drug therapy: Secondary | ICD-10-CM | POA: Diagnosis not present

## 2021-08-01 DIAGNOSIS — E559 Vitamin D deficiency, unspecified: Secondary | ICD-10-CM | POA: Diagnosis not present

## 2021-08-01 DIAGNOSIS — E785 Hyperlipidemia, unspecified: Secondary | ICD-10-CM

## 2021-08-01 DIAGNOSIS — Z8711 Personal history of peptic ulcer disease: Secondary | ICD-10-CM

## 2021-08-01 NOTE — Telephone Encounter (Signed)
Next Visit: 08/01/2021  Last Visit: 04/11/2021  Labs: 01/27/2021 Glucose is high. Creatinine is elevated. CBC is normal  Eye exam: 09/19/2020    Current Dose per office note 04/11/2021: Plaquenil 100 mg 1 tablet by mouth daily  UE:[id number] rheumatoid arthritis   Last Fill: 06/20/2021  Patient to update labs at an appointment today.   Okay to refill Plaquenil?

## 2021-08-01 NOTE — Patient Instructions (Signed)
Standing Labs °We placed an order today for your standing lab work.  ° °Please have your standing labs drawn in March and every 3 months  ° ° °If possible, please have your labs drawn 2 weeks prior to your appointment so that the provider can discuss your results at your appointment. ° °Please note that you may see your imaging and lab results in MyChart before we have reviewed them. °We may be awaiting multiple results to interpret others before contacting you. °Please allow our office up to 72 hours to thoroughly review all of the results before contacting the office for clarification of your results. ° °We have open lab daily: °Monday through Thursday from 1:30-4:30 PM and Friday from 1:30-4:00 PM °at the office of Dr. Shaili Deveshwar, [facility].   °Please be advised, all patients with office appointments requiring lab work will take precedent over walk-in lab work.  °If possible, please come for your lab work on Monday and Friday afternoons, as you may experience shorter wait times. °The office is located at [address], [address], [place], [place] [address] °No appointment is necessary.   °Labs are drawn by [facility]. Please bring your co-pay at the time of your lab draw.  You may receive a bill from [facility] for your lab work. ° °If you wish to have your labs drawn at another location, please call the office 24 hours in advance to send orders. ° °If you have any questions regarding directions or hours of operation,  °please call [phone].   °As a reminder, please drink plenty of water prior to coming for your lab work. Thanks! ° °

## 2021-08-01 NOTE — Telephone Encounter (Signed)
Patient declined refill at her appt today.

## 2021-08-01 NOTE — Chronic Care Management (AMB) (Signed)
Chronic Care Management Pharmacy Assistant   Name: Deborah Travis  MRN: [id number] DOB: 1941-06-02  Reason for Encounter: Chart review for initial encounter with Deborah Travis Clinical Pharmacist on 08/10/21 at 9 am via phone call    Conditions to be addressed/monitored: HTN, DMII, CKD Stage 3, and Osteopenia  Recent office visits:  07/06/21 Deborah Lung, MD - Patient presented via video visit for Flu. Prescribed Oseltamivir Phosphate 75 mg.   06/23/21 Deborah Lung, MD - Patient presented for Type 2 diabetes and other concerns.No medication changes.  03/13/21 Deborah Lung, MD - Patient presented for Type 2 diabetes and other concerns. No medication changes.  02/06/21 Deborah Lung, MD - Patient presented for video visit for type 2 diabetes and other concerns. No medication changes.  Recent consult visits:  08/01/21 Deborah Neas, PA-C - (Rheumatology)  Patient presented for Seronegative rheumatoid arthritis and other concerns. No medication changes.  04/11/21 Deborah Merino, MD - (Rheumatology) Patient presented for Seronegative rheumatoid arthritis and other concerns. No medication changes.  03/27/21 Deborah Travis (Ophthalmology) - Patient presented for Eye evaluation no other visit details available.  Hospital visits:  None in previous 6 months  Medications: Outpatient Encounter Medications as of 08/01/2021  Medication Sig Note   ACCU-CHEK GUIDE test strip TEST BLOOD SUGAR ONE TIME DAILY AS DIRECTED    Accu-Chek Softclix Lancets lancets TEST BLOOD SUGAR TWICE DAILY    Accu-Chek Softclix Lancets lancets TEST BLOOD SUGAR TWICE DAILY    Alcohol Swabs (B-Travis SINGLE USE SWABS BUTTERFLY) PADS 1 each by Does not apply route 2 (two) times daily.    amoxicillin-clavulanate (AUGMENTIN) 875-125 MG tablet Take 1 tablet by mouth 2 (two) times daily. (Patient not taking: Reported on 08/01/2021)    b complex vitamins capsule Take 1 capsule by mouth daily.    benzonatate  (TESSALON) 100 MG capsule Take 2 capsules (200 mg total) by mouth 2 (two) times daily as needed for cough. (Patient not taking: Reported on 08/01/2021)    Blood Glucose Calibration (ACCU-CHEK AVIVA) SOLN Use as directed    Blood Glucose Monitoring Suppl (ACCU-CHEK AVIVA PLUS) w/Device KIT Use as directed Patient is to test BID DX E11.9    Calcium-Magnesium-Zinc 333-133-5 MG TABS Take by mouth.    cholecalciferol (VITAMIN D3) 25 MCG (1000 UT) tablet Take 1,000 Units by mouth 2 (two) times a day.    Coenzyme Q10 (CO Q 10) 10 MG CAPS Take 10 mg by mouth daily.     Dextromethorphan HBr (DELSYM PO) Take by mouth at bedtime.    diphenhydrAMINE (BENADRYL) 25 mg capsule Take by mouth as needed.     hydroxychloroquine (PLAQUENIL) 200 MG tablet TAKE 1 TABLET PO Monday- Friday ONLY. (Patient taking differently: TAKE 1 TABLET PO Monday- Friday ONLY. 1/2 tablet by mouth on Saturday and Sunday.)    leflunomide (ARAVA) 20 MG tablet TAKE 1 TABLET EVERY DAY    levothyroxine (SYNTHROID) 125 MCG tablet TAKE 1 TABLET BY MOUTH EVERY DAY    lisinopril (ZESTRIL) 10 MG tablet TAKE 1 TABLET EVERY DAY    Menthol (RICOLA) LOZG Use as directed 1 each in the mouth or throat as needed. 12/09/2019: Sugar free   metFORMIN (GLUCOPHAGE) 1000 MG tablet TAKE 1 TABLET (1,000 MG TOTAL) BY MOUTH 2 (TWO) TIMES DAILY.    Multiple Vitamin (MULITIVITAMIN WITH MINERALS) TABS Take 1 tablet by mouth daily.    omeprazole (PRILOSEC) 40 MG capsule Take 1 capsule (40 mg total) by mouth  daily.    ondansetron (ZOFRAN ODT) 4 MG disintegrating tablet Take 1 tablet (4 mg total) by mouth every 8 (eight) hours as needed for up to 15 doses for nausea or vomiting. (Patient not taking: Reported on 08/01/2021)    oseltamivir (TAMIFLU) 75 MG capsule Take 1 capsule (75 mg total) by mouth 2 (two) times daily. (Patient not taking: Reported on 08/01/2021)    pravastatin (PRAVACHOL) 20 MG tablet TAKE 1 TABLET EVERY DAY    VITAMIN E COMPLEX PO Take 1 capsule by mouth  daily.     ZENPEP 25000-79000 units CPEP TAKE 3 CAPSULES WITH BREAKFAST, LUNCH AND DINNER AND TAKE 1 CAPSULE WITH EACH SNACK (3 A DAY)    Zinc Sulfate (ZINC 15 PO) Take 15 mg by mouth daily.    No facility-administered encounter medications on file as of 08/01/2021.  Fill History : ACCU-CHEK GUIDE VI STRP 05/10/2021 90   ACCU-CHEK SOFTCLIX LANCETS MISC 05/03/2021 90   BD SINGLE USE SWAB 04/18/2020 90   AMOX-CLAV 875-125 MG TABLET 04/15/2020 10   hydroxychloroquine (PLAQUENIL) tablet 200 mg 04/21/2021 30   leflunomide (ARAVA) tablet 04/11/2021 90   metFORMIN (GLUCOPHAGE) tablet 05/22/2021 90   ONDANSETRON ODT 4 MG TABLET 03/09/2020 5   pravastatin (PRAVACHOL) tablet 04/11/2021 90   Unable to reach to complete initial questions  Care Gaps: Zoster Vaccines - Overdue COVID Booster - Overdue TDAP - Overdue BP- 105/71 ( 08/01/21) AWV-Message sent to Deborah Travis to Schedule AWV Lab Results  Component Value Date   HGBA1C 7.0 (A) 06/23/2021    Star Rating Drugs: Metformin 1000 mg - Last filled 07/31/21  90 DS at [place] Pravastatin (Pravachol) 20 mg - Last filled 06/23/2289 DS at [facility] Call to [place] to verify the above dates as accurate.  08/08/21- Patient returned my call said she was out and would call me back later this afternoon 08/09/21- Attempted to reach left msg to remind of appointment   Deborah Travis Pharmacist Assistant [phone]

## 2021-08-02 ENCOUNTER — Telehealth: Payer: Self-pay | Admitting: Family Medicine

## 2021-08-02 ENCOUNTER — Telehealth: Payer: Self-pay | Admitting: *Deleted

## 2021-08-02 DIAGNOSIS — Z79899 Other long term (current) drug therapy: Secondary | ICD-10-CM

## 2021-08-02 NOTE — Telephone Encounter (Signed)
-----   Message from Ofilia Neas, PA-C sent at 08/02/2021  8:07 AM EST ----- Glucose is elevated-160.  Creatinine is borderline elevated but stable.  GFR is slightly low but stable-52.  Albumin is slightly low.  We will continue to monitor.    RBC count, hemoglobin, and hematocrit are slightly low.  Please clarify if she is taking a multivitamin with iron? I would recommend rechecking CBC with diff in 1 month.

## 2021-08-02 NOTE — Progress Notes (Signed)
Glucose is elevated-160.  Creatinine is borderline elevated but stable.  GFR is slightly low but stable-52.  Albumin is slightly low.  We will continue to monitor.   RBC count, hemoglobin, and hematocrit are slightly low.  Please clarify if she is taking a multivitamin with iron? I would recommend rechecking CBC with diff in 1 month.

## 2021-08-02 NOTE — Telephone Encounter (Signed)
Left message for patient to call back and schedule Medicare Annual Wellness Visit (AWV) either virtually or in office. I left my number for patient to call [phone].  Last AWV 05/10/20 please schedule at anytime with health coach  This should be a 45 minute visit.

## 2021-08-04 ENCOUNTER — Other Ambulatory Visit: Payer: Self-pay | Admitting: Rheumatology

## 2021-08-04 DIAGNOSIS — M0600 Rheumatoid arthritis without rheumatoid factor, unspecified site: Secondary | ICD-10-CM

## 2021-08-04 NOTE — Telephone Encounter (Signed)
Next Visit: 10/30/2021  Last Visit: 08/01/2021  Last Fill: 06/20/2021 (30 day supply)  DX: Seronegative rheumatoid arthritis   Current Dose per office note 08/01/2021: Arava 20 mg 1 tablet by mouth daily  Labs: 08/01/2021 Glucose is elevated-160.  Creatinine is borderline elevated but stable.  GFR is slightly low but stable-52.  Albumin is slightly low. RBC count, hemoglobin, and hematocrit are slightly low.  Okay to refill Arava?

## 2021-08-10 ENCOUNTER — Telehealth: Payer: Medicare HMO

## 2021-08-10 ENCOUNTER — Telehealth: Payer: Self-pay | Admitting: Pharmacist

## 2021-08-10 NOTE — Progress Notes (Deleted)
Chronic Care Management Pharmacy Note  08/10/2021 Name:  Deborah Travis MRN:  [id number] DOB:  [birth year]  Summary: ***  Recommendations/Changes made from today's visit: ***  Plan: ***   Subjective: Deborah Travis is an 80 y.o. year old female who is a primary patient of [name], MD.  The CCM team was consulted for assistance with disease management and care coordination needs.    Engaged with patient by telephone for initial visit in response to provider referral for pharmacy case management and/or care coordination services.   Consent to Services:  The patient was given the following information about Chronic Care Management services today, agreed to services, and gave verbal consent: 1. CCM service includes personalized support from designated clinical staff supervised by the primary care provider, including individualized plan of care and coordination with other care providers 2. 24/7 contact phone numbers for assistance for urgent and routine care needs. 3. Service will only be billed when office clinical staff spend 20 minutes or more in a month to coordinate care. 4. Only one practitioner may furnish and bill the service in a calendar month. 5.The patient may stop CCM services at any time (effective at the end of the month) by phone call to the office staff. 6. The patient will be responsible for cost sharing (co-pay) of up to 20% of the service fee (after annual deductible is met). Patient agreed to services and consent obtained.  Patient Care Team: [name], MD as PCP - General (Family Medicine) Viona Gilmore, [facility] as Pharmacist (Pharmacist)  Recent office visits: 07/06/21 [name], MD - Patient presented via video visit for Flu. Prescribed Oseltamivir Phosphate 75 mg.    06/23/21 [name], MD - Patient presented for Type 2 diabetes and other concerns.No medication changes. A1c improved to 7.0%. Influenza vaccine administered.   03/13/21  [name], MD - Patient presented for Type 2 diabetes and other concerns. No medication changes.   02/06/21 [name], MD - Patient presented for video visit for type 2 diabetes and other concerns. No medication changes.    Recent consult visits: 08/01/21 Ofilia Neas, PA-C - (Rheumatology)  Patient presented for Seronegative rheumatoid arthritis and other concerns. No medication changes.   04/11/21 [name], MD - (Rheumatology) Patient presented for Seronegative rheumatoid arthritis and other concerns. No medication changes.   03/27/21 Marshall Cork D (Ophthalmology) - Patient presented for Eye evaluation no other visit details available.  Hospital visits: None in previous 6 months   Objective:  Lab Results  Component Value Date   CREATININE 1.09 (H) 08/01/2021   BUN 18 08/01/2021   GFRNONAA 34 (L) 01/27/2021   GFRAA 40 (L) 01/27/2021   NA 137 08/01/2021   K 5.0 08/01/2021   CALCIUM 9.0 08/01/2021   CO2 23 08/01/2021   GLUCOSE 160 (H) 08/01/2021    Lab Results  Component Value Date/Time   HGBA1C 7.0 (A) 06/23/2021 10:44 AM   HGBA1C 8.5 (A) 03/13/2021 12:26 PM   HGBA1C 12.0 (H) 01/25/2021 11:03 AM   HGBA1C 6.8 (H) 01/15/2012 12:30 PM   MICROALBUR 5.7 12/24/2017 10:17 AM   MICROALBUR <5.0 06/15/2016 09:33 AM    Last diabetic Eye exam:  Lab Results  Component Value Date/Time   HMDIABEYEEXA Retinopathy (A) 09/19/2020 12:00 AM    Last diabetic Foot exam: No results found for: HMDIABFOOTEX   Lab Results  Component Value Date   CHOL 114 05/10/2020   HDL 62 05/10/2020  LDLCALC 32 05/10/2020   TRIG 110 05/10/2020   CHOLHDL 1.8 05/10/2020    Hepatic Function Latest Ref Rng & Units 08/01/2021 01/27/2021 11/07/2020  Total Protein 6.1 - 8.1 g/dL 6.5 7.1 6.7  Albumin 3.5 - 5.0 g/dL - - -  AST 10 - 35 U/L 16 21 15   ALT 6 - 29 U/L 10 21 13   Alk Phosphatase 38 - 126 U/L - - -  Total Bilirubin 0.2 - 1.2 mg/dL 0.3 0.4 0.2    Lab Results  Component  Value Date/Time   TSH 5.440 (H) 05/10/2020 10:05 AM   TSH 18.800 (H) 03/01/2020 10:02 AM    CBC Latest Ref Rng & Units 08/01/2021 01/27/2021 11/07/2020  WBC 3.8 - 10.8 Thousand/uL 7.1 6.1 7.1  Hemoglobin 11.7 - 15.5 g/dL 10.8(L) 12.8 12.2  Hematocrit 35.0 - 45.0 % 33.3(L) 39.2 37.0  Platelets 140 - 400 Thousand/uL 363 289 324    Lab Results  Component Value Date/Time   VD25OH 42 05/07/2013 09:21 AM    Clinical ASCVD: {YES/NO:21197} The ASCVD Risk score (Arnett DK, et al., 2019) failed to calculate for the following reasons:   The valid total cholesterol range is 130 to 320 mg/dL    Depression screen [facility] 2/9 06/23/2021 05/10/2020 11/17/2018  Decreased Interest 0 0 0  Down, Depressed, Hopeless 0 0 0  PHQ - 2 Score 0 0 0  Some recent data might be hidden     ***Other: (CHADS2VASc if Afib, MMRC or CAT for COPD, ACT, DEXA)  Social History   Tobacco Use  Smoking Status Former   Packs/day: 0.10   Types: Cigarettes   Quit date: 10/31/2015   Years since quitting: 5.7  Smokeless Tobacco Never   BP Readings from Last 3 Encounters:  08/01/21 105/71  07/06/21 124/78  06/23/21 118/70   Pulse Readings from Last 3 Encounters:  08/01/21 80  07/06/21 (!) 107  06/23/21 75   Wt Readings from Last 3 Encounters:  08/01/21 107 lb 9.6 oz (48.8 kg)  07/06/21 111 lb 9.6 oz (50.6 kg)  06/23/21 115 lb (52.2 kg)   BMI Readings from Last 3 Encounters:  08/01/21 19.21 kg/m  07/06/21 19.93 kg/m  06/23/21 20.53 kg/m    Assessment/Interventions: Review of patient past medical history, allergies, medications, health status, including review of consultants reports, laboratory and other test data, was performed as part of comprehensive evaluation and provision of chronic care management services.   SDOH:  (Social Determinants of Health) assessments and interventions performed: {yes/no:20286}  SDOH Screenings   Alcohol Screen: Not on file  Depression (PHQ2-9): Low Risk    PHQ-2 Score: 0   Financial Resource Strain: Not on file  Food Insecurity: Not on file  Housing: Not on file  Physical Activity: Not on file  Social Connections: Not on file  Stress: Not on file  Tobacco Use: Medium Risk   Smoking Tobacco Use: Former   Smokeless Tobacco Use: Never   Passive Exposure: Not on file  Transportation Needs: Not on file    [employer]  Allergies  Allergen Reactions   Levofloxacin     Muscle spasms and tightness    Codeine Itching    Medications Reviewed Today     Reviewed by Su Monks (Physician Assistant Certified) on 68/34/19 at 1042  Med List Status: <None>   Medication Order Taking? Sig Documenting Provider Last Dose Status Informant  ACCU-CHEK GUIDE test strip [id number] Yes TEST BLOOD SUGAR ONE TIME DAILY AS DIRECTED [employer],  Elyse Jarvis, MD Taking Active   Accu-Chek Softclix Lancets lancets [id number] Yes TEST BLOOD SUGAR TWICE DAILY [name], MD Taking Active   Accu-Chek Softclix Lancets lancets [id number] Yes TEST BLOOD SUGAR TWICE DAILY [name], MD Taking Active   Alcohol Swabs (B-D SINGLE USE SWABS BUTTERFLY) PADS [id number] Yes 1 each by Does not apply route 2 (two) times daily. [name], MD Taking Active   amoxicillin-clavulanate (AUGMENTIN) 875-125 MG tablet [id number] No Take 1 tablet by mouth 2 (two) times daily.  Patient not taking: Reported on 08/01/2021   [name], MD Not Taking Active   b complex vitamins capsule [id number] Yes Take 1 capsule by mouth daily. [provider] Taking Active Self  benzonatate (TESSALON) 100 MG capsule [id number] No Take 2 capsules (200 mg total) by mouth 2 (two) times daily as needed for cough.  Patient not taking: Reported on 08/01/2021   [name], MD Not Taking Active   Blood Glucose Calibration (ACCU-CHEK [place]) Bailey Mech [id number] Yes Use as directed [name], MD Taking Active Self  Blood Glucose Monitoring Suppl (ACCU-CHEK AVIVA PLUS) w/Device KIT [id number] Yes Use as  directed Patient is to test BID DX E11.9 [name], MD Taking Active Self  Calcium-Magnesium-Zinc 203 886 9778 MG TABS [id number] Yes Take by mouth. [provider] Taking Active   cholecalciferol (VITAMIN D3) 25 MCG (1000 UT) tablet [id number] Yes Take 1,000 Units by mouth 2 (two) times a day. [provider] Taking Active Self  Coenzyme Q10 (CO Q 10) 10 MG CAPS [id number] Yes Take 10 mg by mouth daily.  [provider] Taking Active Self  Dextromethorphan HBr (DELSYM PO) [id number] Yes Take by mouth at bedtime. [provider] Taking Active   diphenhydrAMINE (BENADRYL) 25 mg capsule [id number] Yes Take by mouth as needed.  [provider] Taking Active   hydroxychloroquine (PLAQUENIL) 200 MG tablet [id number] Yes TAKE 1 TABLET PO Monday- Friday ONLY.  Patient taking differently: TAKE 1 TABLET PO Monday- Friday ONLY. 1/2 tablet by mouth on Saturday and Sunday.   [name], MD Taking Active   leflunomide (ARAVA) 20 MG tablet [id number] Yes TAKE 1 TABLET EVERY DAY [name], MD Taking Active   levothyroxine (SYNTHROID) 125 MCG tablet [id number] Yes TAKE 1 TABLET BY MOUTH EVERY DAY [name], MD Taking Active   lisinopril (ZESTRIL) 10 MG tablet [id number] Yes TAKE 1 TABLET EVERY DAY [name], MD Taking Active   Menthol [facility]) Doreene Eland [id number] Yes Use as directed 1 each in the mouth or throat as needed. [provider] Taking Active Self           Med Note Tamala Julian, Chauncey Cruel Dec 09, 2019 10:21 AM) Sugar free  metFORMIN (GLUCOPHAGE) 1000 MG tablet [id number] Yes TAKE 1 TABLET (1,000 MG TOTAL) BY MOUTH 2 (TWO) TIMES DAILY. [name], MD Taking Active   Multiple Vitamin [facility] WITH MINERALS) TABS [id number] Yes Take 1 tablet by mouth daily. [provider] Taking Active Self  omeprazole (PRILOSEC) 40 MG capsule [id number] Yes Take 1 capsule (40 mg total) by mouth daily. [name], MD Taking Active    ondansetron (ZOFRAN ODT) 4 MG disintegrating tablet [id number] No Take 1 tablet (4 mg total) by mouth every 8 (eight) hours as needed for up to 15 doses for nausea or vomiting.  Patient not taking: Reported on 08/01/2021   Wyvonnia Dusky, MD Not Taking Active   oseltamivir (TAMIFLU) 75  MG capsule [id number] No Take 1 capsule (75 mg total) by mouth 2 (two) times daily.  Patient not taking: Reported on 08/01/2021   [name], MD Not Taking Active   pravastatin (PRAVACHOL) 20 MG tablet [id number] Yes TAKE 1 TABLET EVERY DAY [name], MD Taking Active   VITAMIN E COMPLEX PO [id number] Yes Take 1 capsule by mouth daily.  [provider] Taking Active Self  ZENPEP 25000-79000 units CPEP [id number] Yes TAKE 3 CAPSULES WITH BREAKFAST, LUNCH AND DINNER AND TAKE 1 CAPSULE WITH EACH SNACK (3 A DAY) [name], MD Taking Active   Zinc Sulfate (ZINC 15 PO) [id number] Yes Take 15 mg by mouth daily. [provider] Taking Active Self            Patient Active Problem List   Diagnosis Date Noted   Aortic atherosclerosis ([place]) 06/23/2021   Diabetic nephropathy associated with type 2 diabetes mellitus ([place]) 02/06/2021   Stage 3b chronic kidney disease ([place]) 02/06/2021   Chronic calcific pancreatitis ([place]) 08/12/2020   Weight loss 12/18/2019   Abdominal tenderness 12/18/2019   History of pancreatitis 01/20/2019   Atherosclerosis 08/08/2018   Glaucoma 05/26/2018   Hypertensive retinopathy 05/26/2018   Seronegative rheumatoid arthritis ([place]) 03/04/2018   Senile purpura ([place]) 02/01/2015   Former smoker 10/12/2013   History of peptic ulcer disease 07/13/2013   Osteopenia 05/07/2013   Hypothyroidism 01/12/2012   Type 2 diabetes with complication ([place]) 44/96/7591   Hypertension associated with diabetes ([place]) 05/21/2011   Exocrine pancreatic insufficiency 05/21/2011   Hyperlipidemia associated with type 2 diabetes mellitus ([place]) 05/21/2011    Immunization History   Administered Date(s) Administered   Fluad Quad(high Dose 65+) 06/01/2019, 05/10/2020, 06/23/2021   Influenza Split 05/21/2011, 06/12/2012, 07/15/2013   Influenza, High Dose Seasonal PF 07/13/2013, 07/01/2014, 06/01/2015, 06/15/2016, 04/15/2017, 05/19/2018   PFIZER Comirnaty(Gray Top)Covid-19 Tri-Sucrose Vaccine 01/25/2021   PFIZER(Purple Top)SARS-COV-2 Vaccination 09/18/2019, 10/09/2019   Pneumococcal Conjugate-13 06/15/2016   Pneumococcal Polysaccharide-23 06/12/2012   Tdap 06/28/2011   Zoster, Live 06/27/2013    Conditions to be addressed/monitored:  Hypertension, Hyperlipidemia, Diabetes, Chronic Kidney Disease, Hypothyroidism, Osteopenia, and RA, Pancreatic insufficiency  There are no care plans that you recently modified to display for this patient.  Current Barriers:  {pharmacybarriers:24917}  Pharmacist Clinical Goal(s):  Patient will {PHARMACYGOALCHOICES:24921} through collaboration with PharmD and provider.   Interventions: 1:1 collaboration with [name], MD regarding development and update of comprehensive plan of care as evidenced by provider attestation and co-signature Inter-disciplinary care team collaboration (see longitudinal plan of care) Comprehensive medication review performed; medication list updated in electronic medical record BP Readings from Last 3 Encounters:  08/01/21 105/71  07/06/21 124/78  06/23/21 118/70     Hypertension (BP goal <130/80) -Controlled -Current treatment: Lisinopril 10 mg 1 tablet daily -Medications previously tried: ***  -Current home readings: *** -Current dietary habits: *** -Current exercise habits: *** -{ACTIONS;DENIES/REPORTS:21021675::"Denies"} hypotensive/hypertensive symptoms -Educated on {CCM BP Counseling:25124} -Counseled to monitor BP at home ***, document, and provide log at future appointments -{CCMPHARMDINTERVENTION:25122}  Lab Results  Component Value Date   CHOL 114 05/10/2020   HDL 62  05/10/2020   [place] 32 05/10/2020   TRIG 110 05/10/2020   CHOLHDL 1.8 05/10/2020    Hyperlipidemia: (LDL goal < 70) -Controlled -Current treatment: Pravastatin 20 mg 1 tablet daily -Medications previously tried: ***  -Current dietary patterns: *** -Current exercise habits: *** -Educated on {CCM HLD Counseling:25126} -{CCMPHARMDINTERVENTION:25122}  Diabetes (A1c goal <7%) -{US controlled/uncontrolled:25276} -Current medications: Metformin  1000 mg 1 tablet twice daily -Medications previously tried: ***  -Current home glucose readings fasting glucose: *** post prandial glucose: *** -{ACTIONS;DENIES/REPORTS:21021675::"Denies"} hypoglycemic/hyperglycemic symptoms -Current meal patterns:  breakfast: ***  lunch: ***  dinner: *** snacks: *** drinks: *** -Current exercise: *** -Educated on {CCM DM COUNSELING:25123} -Counseled to check feet daily and get yearly eye exams -{CCMPHARMDINTERVENTION:25122} Sglt2?  Osteoporosis / Osteopenia (Goal ***) -{US controlled/uncontrolled:25276} -Last DEXA Scan: 2019   T-Score femoral neck: -2.4  T-Score total hip: n/a  T-Score lumbar spine: -1.6  T-Score forearm radius: n/a  10-year probability of major osteoporotic fracture: 10.5%  10-year probability of hip fracture: 2.5% -Patient is not a candidate for pharmacologic treatment -Current treatment  Vitamin D 1000 units daily Calcium-mag-zinc ? -Medications previously tried: ***  -{Osteoporosis Counseling:23892} -{CCMPHARMDINTERVENTION:25122} Did she schedule repeat?  Lab Results  Component Value Date   TSH 5.440 (H) 05/10/2020    Hypothyroidism (Goal: TSH 2-4) -{US controlled/uncontrolled:25276} -Current treatment  Levothyroxine 125 mcg 1 tablet daily -Medications previously tried: ***  -{CCMPHARMDINTERVENTION:25122} Repeat tsh  Rheumatoid arthritis (Goal: ***) -{US controlled/uncontrolled:25276} -Current treatment  Leflunomide 20 mg 1 tablet daily Hydroxychloroquine  200 mg 1 tablet Mon-Fri and 1/2 tab on Sat and Sunday -Medications previously tried: ***  -{CCMPHARMDINTERVENTION:25122}  Pancreatic insufficiency (Goal: ***) -{US controlled/uncontrolled:25276} -Current treatment  Zenpep 25000-79000 -Medications previously tried: ***  -{CCMPHARMDINTERVENTION:25122}    Health Maintenance -Vaccine gaps: shingrix, COVID booster, tetanus -Current therapy:  Zinc sulfate 15 mg daily Vitamin E daily Benadryl? Multivitamin daily Zofran? -Educated on {ccm supplement counseling:25128} -{CCM Patient satisfied:25129} -{CCMPHARMDINTERVENTION:25122}  Patient Goals/Self-Care Activities Patient will:  - {pharmacypatientgoals:24919}  Follow Up Plan: {CM FOLLOW UP JIRC:78938}    Medication Assistance: {MEDASSISTANCEINFO:25044}  Compliance/Adherence/Medication fill history: Care Gaps: Tetanus, COVID booster, shingrix BP- 105/71 ( 08/01/21) A1c: 7.0%  Star-Rating Drugs: Metformin 1000 mg - Last filled 07/31/21  90 DS at [place] Pravastatin (Pravachol) 20 mg - Last filled 06/23/2289 DS at [facility]  Patient's preferred pharmacy is:  [id number] [place], [place] - [address] [place] [address]Phone: [phone]: [phone] [place]Phone: [phone]: [phone] Uses pill box? {Yes or If no, why not?:20788} Pt endorses ***% compliance  We discussed: {Pharmacy options:24294} [facility] and Follow Up Patient Decision:  {FOLLOWUP:24991}  Plan: {CM FOLLOW UP PJASN:05397} MJeni Salles PharmD, BMercy Southwest HospitalClinical Pharmacist {MP practice sites:26434} [phone]

## 2021-08-10 NOTE — Telephone Encounter (Signed)
°  Chronic Care Management   Outreach Note  08/10/2021 Name: YERANIA CHAMORRO MRN: [id number] DOB: 1940/09/30  Referred by: [name], MD  Patient had a phone appointment scheduled with clinical pharmacist today.  An unsuccessful telephone outreach was attempted today. The patient was referred to the pharmacist for assistance with care management and care coordination.   If possible, a message was left to return call to: [phone] or to [place]: [place], PharmD, [place] [phone]

## 2021-08-16 LAB — CBC WITH DIFFERENTIAL/PLATELET
Absolute Monocytes: 831 cells/uL (ref 200–950)
Basophils Absolute: 43 cells/uL (ref 0–200)
Basophils Relative: 0.6 %
Eosinophils Absolute: 511 cells/uL — ABNORMAL HIGH (ref 15–500)
Eosinophils Relative: 7.2 %
HCT: 33.3 % — ABNORMAL LOW (ref 35.0–45.0)
Hemoglobin: 10.8 g/dL — ABNORMAL LOW (ref 11.7–15.5)
Lymphs Abs: 2052 cells/uL (ref 850–3900)
MCH: 30.4 pg (ref 27.0–33.0)
MCHC: 32.4 g/dL (ref 32.0–36.0)
MCV: 93.8 fL (ref 80.0–100.0)
MPV: 9.2 fL (ref 7.5–12.5)
Monocytes Relative: 11.7 %
Neutro Abs: 3664 cells/uL (ref 1500–7800)
Neutrophils Relative %: 51.6 %
Platelets: 363 10*3/uL (ref 140–400)
RBC: 3.55 10*6/uL — ABNORMAL LOW (ref 3.80–5.10)
RDW: 13.6 % (ref 11.0–15.0)
Total Lymphocyte: 28.9 %
WBC: 7.1 10*3/uL (ref 3.8–10.8)

## 2021-08-16 LAB — COMPLETE METABOLIC PANEL WITH GFR
AG Ratio: 1.1 (calc) (ref 1.0–2.5)
ALT: 10 U/L (ref 6–29)
AST: 16 U/L (ref 10–35)
Albumin: 3.4 g/dL — ABNORMAL LOW (ref 3.6–5.1)
Alkaline phosphatase (APISO): 98 U/L (ref 37–153)
BUN/Creatinine Ratio: 17 (calc) (ref 6–22)
BUN: 18 mg/dL (ref 7–25)
CO2: 23 mmol/L (ref 20–32)
Calcium: 9 mg/dL (ref 8.6–10.4)
Chloride: 103 mmol/L (ref 98–110)
Creat: 1.09 mg/dL — ABNORMAL HIGH (ref 0.60–1.00)
Globulin: 3.1 g/dL (calc) (ref 1.9–3.7)
Glucose, Bld: 160 mg/dL — ABNORMAL HIGH (ref 65–99)
Potassium: 5 mmol/L (ref 3.5–5.3)
Sodium: 137 mmol/L (ref 135–146)
Total Bilirubin: 0.3 mg/dL (ref 0.2–1.2)
Total Protein: 6.5 g/dL (ref 6.1–8.1)
eGFR: 52 mL/min/{1.73_m2} — ABNORMAL LOW (ref >=60)

## 2021-08-16 LAB — HYDROXYCHLOROQUINE,BLOOD: HYDROXYCHLOROQUINE, (B): 1000 ng/mL — ABNORMAL HIGH

## 2021-09-05 ENCOUNTER — Other Ambulatory Visit: Payer: Self-pay

## 2021-09-05 DIAGNOSIS — M0600 Rheumatoid arthritis without rheumatoid factor, unspecified site: Secondary | ICD-10-CM

## 2021-09-05 MED ORDER — HYDROXYCHLOROQUINE SULFATE 200 MG PO TABS: ORAL_TABLET | ORAL | 2 refills | Status: DC

## 2021-09-05 NOTE — Telephone Encounter (Signed)
Patient called requesting prescription refill of Hydroxychloroquine to be sent to [place].

## 2021-09-05 NOTE — Telephone Encounter (Signed)
Next Visit: 10/30/2021  Last Visit: 08/01/2021  Labs: 08/01/2021 Glucose is elevated-160.  Creatinine is borderline elevated but stable.  GFR is slightly low but stable-52.  Albumin is slightly low.    Eye exam: 09/19/2020 WNL    Current Dose per office note 08/01/2021: Plaquenil 200 mg 1 tablet by mouth daily Monday through Friday and half tablet on Saturday and sundays.   NL:GXQJJHERDEYC rheumatoid arthritis   Last Fill: 06/20/2021  Okay to refill Plaquenil?

## 2021-09-06 ENCOUNTER — Telehealth: Payer: Self-pay | Admitting: Family Medicine

## 2021-09-06 NOTE — Telephone Encounter (Signed)
Left message for patient to call back and schedule Medicare Annual Wellness Visit (AWV) either virtually or in office. I left my number for patient to call [phone].  Last AWV 05/10/20 ; please schedule at anytime with health coach  This should be a 45 minute visit.

## 2021-10-04 ENCOUNTER — Telehealth: Payer: Self-pay | Admitting: Family Medicine

## 2021-10-04 NOTE — Telephone Encounter (Signed)
Left message for patient to call back and schedule Medicare Annual Wellness Visit (AWV) either virtually or in office. I left my number for patient to call [phone].  Last AWV 05/10/20 ; please schedule at anytime with health coach  This should be a 45 minute visit.

## 2021-10-16 ENCOUNTER — Other Ambulatory Visit: Payer: Self-pay | Admitting: *Deleted

## 2021-10-16 DIAGNOSIS — Z79899 Other long term (current) drug therapy: Secondary | ICD-10-CM | POA: Diagnosis not present

## 2021-10-16 LAB — CBC WITH DIFFERENTIAL/PLATELET
Absolute Monocytes: 832 cells/uL (ref 200–950)
Basophils Absolute: 33 cells/uL (ref 0–200)
Basophils Relative: 0.5 %
Eosinophils Absolute: 231 cells/uL (ref 15–500)
Eosinophils Relative: 3.5 %
HCT: 36.5 % (ref 35.0–45.0)
Hemoglobin: 12 g/dL (ref 11.7–15.5)
Lymphs Abs: 2726 cells/uL (ref 850–3900)
MCH: 30.1 pg (ref 27.0–33.0)
MCHC: 32.9 g/dL (ref 32.0–36.0)
MCV: 91.5 fL (ref 80.0–100.0)
MPV: 9.9 fL (ref 7.5–12.5)
Monocytes Relative: 12.6 %
Neutro Abs: 2779 cells/uL (ref 1500–7800)
Neutrophils Relative %: 42.1 %
Platelets: 313 10*3/uL (ref 140–400)
RBC: 3.99 10*6/uL (ref 3.80–5.10)
RDW: 13.3 % (ref 11.0–15.0)
Total Lymphocyte: 41.3 %
WBC: 6.6 10*3/uL (ref 3.8–10.8)

## 2021-10-16 NOTE — Progress Notes (Signed)
Office Visit Note  Patient: Deborah Travis             Date of Birth: 11-13-1940           MRN: [id number]             PCP: [name], MD Referring: [name], MD Visit Date: 10/30/2021 Occupation: @GUAROCC @  Subjective:  Right ring trigger finger   History of Present Illness: Deborah Travis is a 81 y.o. female with history of seronegative rheumatoid arthritis.  She is taking Arava 20 mg 1 tablet by mouth daily and Plaquenil 200 mg 1 tablet by mouth daily Monday through Friday and half tablet on Saturday and sundays.  She is tolerating both medications without any side effects and has not missed any doses recently.  She states she continues to have pain and stiffness in the right third and fourth fingers.  Her right fourth finger has been locking and has been inflamed at times.  She states her stiffness is worse first thing in the morning.  Her right middle finger has not been triggering recently.  Overall her symptoms have improved since her last office visit in December 2022.  She denies any other joint pain or joint swelling at this time. She denies any recent infections.    Activities of Daily Living:  Patient reports morning stiffness for 0 minutes.   Patient Denies nocturnal pain.  Difficulty dressing/grooming: Reports Difficulty climbing stairs: Denies Difficulty getting out of chair: Denies Difficulty using hands for taps, buttons, cutlery, and/or writing: Reports  Review of Systems  Constitutional:  Negative for fatigue.  HENT:  Negative for mouth sores, mouth dryness and nose dryness.        Nose sore  Eyes:  Negative for pain, itching and dryness.  Respiratory:  Negative for shortness of breath and difficulty breathing.   Cardiovascular:  Negative for chest pain and palpitations.  Gastrointestinal:  Negative for blood in stool, constipation and diarrhea.  Endocrine: Negative for increased urination.  Genitourinary:  Negative for difficulty urinating.   Musculoskeletal:  Positive for joint pain, joint pain and joint swelling. Negative for myalgias, morning stiffness, muscle tenderness and myalgias.  Skin:  Negative for color change, rash and redness.  Allergic/Immunologic: Positive for susceptible to infections.  Neurological:  Negative for dizziness, numbness, headaches, memory loss and weakness.  Hematological:  Positive for bruising/bleeding tendency.  Psychiatric/Behavioral:  Negative for confusion.    PMFS History:  Patient Active Problem List   Diagnosis Date Noted   Aortic atherosclerosis ([place]) 06/23/2021   Diabetic nephropathy associated with type 2 diabetes mellitus ([place]) 02/06/2021   Stage 3b chronic kidney disease ([place]) 02/06/2021   Chronic calcific pancreatitis ([place]) 08/12/2020   Weight loss 12/18/2019   Abdominal tenderness 12/18/2019   History of pancreatitis 01/20/2019   Atherosclerosis 08/08/2018   Glaucoma 05/26/2018   Hypertensive retinopathy 05/26/2018   Seronegative rheumatoid arthritis ([place]) 03/04/2018   Senile purpura ([place]) 02/01/2015   Former smoker 10/12/2013   History of peptic ulcer disease 07/13/2013   Osteopenia 05/07/2013   Hypothyroidism 01/12/2012   Type 2 diabetes with complication ([place]) 77/93/9030   Hypertension associated with diabetes ([place]) 05/21/2011   Exocrine pancreatic insufficiency 05/21/2011   Hyperlipidemia associated with type 2 diabetes mellitus ([place]) 05/21/2011    Past Medical History:  Diagnosis Date   Chronic kidney disease    RENAL STONE   Chronic pancreatitis (HCC)    Diabetes mellitus    Duodenal ulcer  Dyslipidemia    GERD (gastroesophageal reflux disease)    Hypertension    Osteopenia    Thyroid disease    HYPOTHYROID   Vitamin D deficiency     Family History  Problem Relation Age of Onset   Stroke Mother    Heart disease Father    Healthy Daughter    Healthy Daughter    Emphysema Daughter    Diabetes Daughter    Hernia Daughter    Past Surgical History:   Procedure Laterality Date   APPENDECTOMY     CATARACT EXTRACTION Right 2014   CHOLECYSTECTOMY     TONSILECTOMY, ADENOIDECTOMY, BILATERAL MYRINGOTOMY AND TUBES     [place]   Social History   Social History Narrative   Not on file   Immunization History  Administered Date(s) Administered   Fluad Quad(high Dose 65+) 06/01/2019, 05/10/2020, 06/23/2021   Influenza Split 05/21/2011, 06/12/2012, 07/15/2013   Influenza, High Dose Seasonal PF 07/13/2013, 07/01/2014, 06/01/2015, 06/15/2016, 04/15/2017, 05/19/2018   PFIZER Comirnaty(Gray Top)Covid-19 Tri-Sucrose Vaccine 01/25/2021   PFIZER(Purple Top)SARS-COV-2 Vaccination 09/18/2019, 10/09/2019   Pneumococcal Conjugate-13 06/15/2016   Pneumococcal Polysaccharide-23 06/12/2012   Tdap 06/28/2011   Zoster, Live 06/27/2013     Objective: Vital Signs: BP (!) 142/85 (BP Location: Left Arm, Patient Position: Sitting, Cuff Size: Normal)    Pulse 80    Ht 5' 2.75" (1.594 m)    Wt 107 lb 3.2 oz (48.6 kg)    LMP  (LMP Unknown)    BMI 19.14 kg/m    Physical Exam Vitals and nursing note reviewed.  Constitutional:      Appearance: She is well-developed.  HENT:     Head: Normocephalic and atraumatic.  Eyes:     Conjunctiva/sclera: Conjunctivae normal.  Cardiovascular:     Rate and Rhythm: Normal rate and regular rhythm.     Heart sounds: Normal heart sounds.  Pulmonary:     Effort: Pulmonary effort is normal.     Breath sounds: Normal breath sounds.  Abdominal:     General: Bowel sounds are normal.     Palpations: Abdomen is soft.  Musculoskeletal:     Cervical back: Normal range of motion.  Lymphadenopathy:     Cervical: No cervical adenopathy.  Skin:    General: Skin is warm and dry.     Capillary Refill: Capillary refill takes less than 2 seconds.  Neurological:     Mental Status: She is alert and oriented to person, place, and time.  Psychiatric:        Behavior: Behavior normal.     Musculoskeletal Exam:  C-spine limited ROM with lateral rotation.  Shoulder joints, elbow joints, wrist joints and MCPs have good range of motion with no synovitis.  She has tenderness over the right third, fourth, and fifth MCP joints.  Right ring trigger finger.  PIP and DIP thickening consistent with osteoarthritis of both hands.  Hip joints have good ROM with no groin pain.  Knee joints have good ROM with no warmth or effusion.  Ankle joints have good range of motion with no tenderness or joint swelling.  CDAI Exam: CDAI Score: 4.4  Patient Global: 2 mm; Provider Global: 2 mm Swollen: 0 ; Tender: 4  Joint Exam 10/30/2021      Right  Left  MCP 3   Tender     MCP 4   Tender     MCP 5   Tender     PIP 4   Tender  Investigation: No additional findings.  Imaging: No results found.  Recent Labs: Lab Results  Component Value Date   WBC 6.6 10/16/2021   HGB 12.0 10/16/2021   PLT 313 10/16/2021   NA 137 08/01/2021   K 5.0 08/01/2021   CL 103 08/01/2021   CO2 23 08/01/2021   GLUCOSE 160 (H) 08/01/2021   BUN 18 08/01/2021   CREATININE 1.09 (H) 08/01/2021   BILITOT 0.3 08/01/2021   ALKPHOS 77 03/09/2020   AST 16 08/01/2021   ALT 10 08/01/2021   PROT 6.5 08/01/2021   ALBUMIN 3.4 (L) 03/09/2020   CALCIUM 9.0 08/01/2021   GFRAA 40 (L) 01/27/2021   QFTBGOLDPLUS NEGATIVE 03/30/2019    Speciality Comments: PLQ Eye Exam: 09/19/2020 WNL @ [facility] Follow up in 6 months   Procedures:  No procedures performed Allergies: Levofloxacin and Codeine   Assessment / Plan:     Visit Diagnoses: Seronegative rheumatoid arthritis ([place]) - RF-,CCP-, 14-3-3 eta negative, Uric acid 4.8, ANA negative, no erosive changes on XR of hands: She has no synovitis on examination today.  She has not had any signs or symptoms of a rheumatoid arthritis flare.  She continues to have some intermittent tenderness, stiffness, locking of the right fourth finger consistent with a trigger finger.  Different treatment options  were discussed today in detail.  Overall her rheumatoid arthritis seems better controlled on her Reyvow 20 mg 1 tablet daily and Plaquenil 200 mg 1 tablet Monday through Friday and half tablet on Saturday and Sundays.  Hydroxychloroquine level was checked today along with an updated CMP.  She will remain on the current treatment regimen and we will notify her of if we have to make any dose changes.  She was advised to notify us if she develops increased joint pain or joint swelling.  She will follow-up in the office in 5 months.  High risk medication use - Arava 20 mg 1 tablet by mouth daily, Plaquenil 200 mg 1 tablet by mouth daily Monday through Friday and half tablet on Saturday and sundays.  Hydroxychloroquine level will be checked today.  Her last dose of Plaquenil was yesterday. CBC WNL on 10/16/21.  CMP updated on 08/01/21.  Hydroxychloroquine level 1,000 on 08/01/21. - Plan: Hydroxychloroquine, Blood, COMPLETE METABOLIC PANEL WITH GFR PLQ Eye Exam: 09/19/2020 WNL @ [facility] Follow up in 6 months.  She was advised to schedule an updated Plaquenil eye examination.  She was given a Plaquenil eye exam form to take with her to her next appointment. She has not had any recent infections.  Discussed the importance of holding [place] if she develops signs or symptoms of an infection and to resume once the infection has completely cleared.  Trigger little finger of right hand: Resolved.   Trigger finger, right ring finger: She continues to experience intermittent tenderness and locking of the right ring finger consistent with a trigger finger.  Her symptoms have been less severe since her last office visit.  Different treatment options were discussed today in detail.  She plans on either trying a splint or buddy tape to the adjacent finger to see if this will alleviate her symptoms.  If her symptoms persist or worsen she is considering scheduling ultrasound-guided cortisone injection in the future.  DDD  (degenerative disc disease), cervical: She has limited ROM with lateral rotation of the C-spine.   Osteopenia of multiple sites - DEXA on 02/18/18: The BMD measured at [facility] Total Right is 0.700 g/cm2 with a  T-score of -2.4.  She is currently taking a calcium and vitamin D supplement.  Updated DEXA order placed by Dr. Redmond School.  She has an upcoming appointment  with Dr. Redmond School on 11/21/2021 at which time she plans on discussing an updated bone density. She has not had any recent falls or fractures.  Vitamin D deficiency: She is taking a daily vitamin D supplement.   Other medical conditions are listed as follows:  Senile purpura ([place])  Hypertension associated with diabetes ([place])  History of hypothyroidism  Hyperlipidemia associated with type 2 diabetes mellitus ([place])  History of peptic ulcer disease  History of gastroesophageal reflux (GERD)  History of chronic pancreatitis  Former smoker  Orders: Orders Placed This Encounter  Procedures   Hydroxychloroquine, Blood   COMPLETE METABOLIC PANEL WITH GFR   No orders of the defined types were placed in this encounter.    Follow-Up Instructions: Return in 5 months (on 04/01/2022) for Rheumatoid arthritis.   Ofilia Neas, PA-C  Note - This record has been created using Dragon software.  Chart creation errors have been sought, but may not always  have been located. Such creation errors do not reflect on  the standard of medical care.

## 2021-10-17 NOTE — Progress Notes (Signed)
CBC with diff WNL

## 2021-10-30 ENCOUNTER — Encounter: Payer: Self-pay | Admitting: Physician Assistant

## 2021-10-30 ENCOUNTER — Ambulatory Visit: Payer: Medicare HMO | Admitting: Physician Assistant

## 2021-10-30 ENCOUNTER — Ambulatory Visit: Payer: Medicare HMO | Admitting: Rheumatology

## 2021-10-30 ENCOUNTER — Other Ambulatory Visit: Payer: Self-pay

## 2021-10-30 VITALS — BP 142/85 | HR 80 | Ht 62.75 in | Wt 107.2 lb

## 2021-10-30 DIAGNOSIS — M0600 Rheumatoid arthritis without rheumatoid factor, unspecified site: Secondary | ICD-10-CM

## 2021-10-30 DIAGNOSIS — Z8639 Personal history of other endocrine, nutritional and metabolic disease: Secondary | ICD-10-CM

## 2021-10-30 DIAGNOSIS — Z8719 Personal history of other diseases of the digestive system: Secondary | ICD-10-CM

## 2021-10-30 DIAGNOSIS — Z79899 Other long term (current) drug therapy: Secondary | ICD-10-CM | POA: Diagnosis not present

## 2021-10-30 DIAGNOSIS — M8589 Other specified disorders of bone density and structure, multiple sites: Secondary | ICD-10-CM | POA: Diagnosis not present

## 2021-10-30 DIAGNOSIS — M5030 Other cervical disc degeneration, unspecified cervical region: Secondary | ICD-10-CM | POA: Diagnosis not present

## 2021-10-30 DIAGNOSIS — M65341 Trigger finger, right ring finger: Secondary | ICD-10-CM

## 2021-10-30 DIAGNOSIS — Z8711 Personal history of peptic ulcer disease: Secondary | ICD-10-CM

## 2021-10-30 DIAGNOSIS — E1159 Type 2 diabetes mellitus with other circulatory complications: Secondary | ICD-10-CM

## 2021-10-30 DIAGNOSIS — M65351 Trigger finger, right little finger: Secondary | ICD-10-CM | POA: Diagnosis not present

## 2021-10-30 DIAGNOSIS — I152 Hypertension secondary to endocrine disorders: Secondary | ICD-10-CM

## 2021-10-30 DIAGNOSIS — E559 Vitamin D deficiency, unspecified: Secondary | ICD-10-CM | POA: Diagnosis not present

## 2021-10-30 DIAGNOSIS — D692 Other nonthrombocytopenic purpura: Secondary | ICD-10-CM

## 2021-10-30 DIAGNOSIS — Z87891 Personal history of nicotine dependence: Secondary | ICD-10-CM

## 2021-10-30 DIAGNOSIS — E1169 Type 2 diabetes mellitus with other specified complication: Secondary | ICD-10-CM

## 2021-10-30 DIAGNOSIS — E785 Hyperlipidemia, unspecified: Secondary | ICD-10-CM

## 2021-10-31 ENCOUNTER — Telehealth: Payer: Self-pay | Admitting: Family Medicine

## 2021-10-31 NOTE — Telephone Encounter (Signed)
Left message for patient to call back and schedule Medicare Annual Wellness Visit (AWV) either virtually or in office. ?I left my number for patient to call [phone]. ? ?Last AWV ;05/10/20 ?please schedule at anytime with health coach ? ?This should be a 45 minute visit.   ?

## 2021-10-31 NOTE — Progress Notes (Signed)
Glucose is 119.  Creatinine is borderline elevated-1.03 and GFR is borderline low-56 but improving.  Rest of CMP WNL

## 2021-11-07 LAB — COMPLETE METABOLIC PANEL WITH GFR
AG Ratio: 1.3 (calc) (ref 1.0–2.5)
ALT: 10 U/L (ref 6–29)
AST: 18 U/L (ref 10–35)
Albumin: 4.2 g/dL (ref 3.6–5.1)
Alkaline phosphatase (APISO): 106 U/L (ref 37–153)
BUN/Creatinine Ratio: 18 (calc) (ref 6–22)
BUN: 19 mg/dL (ref 7–25)
CO2: 25 mmol/L (ref 20–32)
Calcium: 10 mg/dL (ref 8.6–10.4)
Chloride: 102 mmol/L (ref 98–110)
Creat: 1.03 mg/dL — ABNORMAL HIGH (ref 0.60–1.00)
Globulin: 3.2 g/dL (calc) (ref 1.9–3.7)
Glucose, Bld: 119 mg/dL — ABNORMAL HIGH (ref 65–99)
Potassium: 4.6 mmol/L (ref 3.5–5.3)
Sodium: 138 mmol/L (ref 135–146)
Total Bilirubin: 0.3 mg/dL (ref 0.2–1.2)
Total Protein: 7.4 g/dL (ref 6.1–8.1)
eGFR: 56 mL/min/{1.73_m2} — ABNORMAL LOW (ref >=60)

## 2021-11-07 LAB — HYDROXYCHLOROQUINE,BLOOD: HYDROXYCHLOROQUINE, (B): 440 ng/mL — ABNORMAL HIGH

## 2021-11-07 NOTE — Progress Notes (Signed)
Hydroxychloroquine level is low-440. Please clarify how she is taking plaquenil.

## 2021-11-08 NOTE — Progress Notes (Signed)
Discussed with Dr. Estanislado Pandy yesterday. Ok to continue current dose.

## 2021-11-16 ENCOUNTER — Other Ambulatory Visit: Payer: Self-pay | Admitting: Physician Assistant

## 2021-11-16 DIAGNOSIS — M0600 Rheumatoid arthritis without rheumatoid factor, unspecified site: Secondary | ICD-10-CM

## 2021-11-16 NOTE — Telephone Encounter (Signed)
Next Visit: 01/30/2022 ? ?Last Visit: 10/30/2021 ? ?Labs: 10/30/2021 Glucose is 119.  Creatinine is borderline elevated-1.03 and GFR is borderline low-56 but improving.  Rest of CMP WNL ? ?Eye exam: 09/19/2020 WNL   ? ?Current Dose per office note 10/30/2021: Plaquenil 200 mg 1 tablet Monday through Friday and half tablet on Saturday and Sundays ? ?[id number] rheumatoid arthritis  ? ?Last Fill: 09/05/2021 ? ?Okay to refill Plaquenil?  ?

## 2021-11-21 ENCOUNTER — Ambulatory Visit (INDEPENDENT_AMBULATORY_CARE_PROVIDER_SITE_OTHER): Payer: Medicare HMO | Admitting: Family Medicine

## 2021-11-21 ENCOUNTER — Encounter: Payer: Self-pay | Admitting: Family Medicine

## 2021-11-21 ENCOUNTER — Other Ambulatory Visit: Payer: Self-pay

## 2021-11-21 VITALS — BP 110/68 | HR 68 | Temp 97.7°F | Ht 63.0 in | Wt 106.0 lb

## 2021-11-21 DIAGNOSIS — E039 Hypothyroidism, unspecified: Secondary | ICD-10-CM | POA: Diagnosis not present

## 2021-11-21 DIAGNOSIS — E1121 Type 2 diabetes mellitus with diabetic nephropathy: Secondary | ICD-10-CM

## 2021-11-21 DIAGNOSIS — E118 Type 2 diabetes mellitus with unspecified complications: Secondary | ICD-10-CM | POA: Diagnosis not present

## 2021-11-21 DIAGNOSIS — K8681 Exocrine pancreatic insufficiency: Secondary | ICD-10-CM | POA: Diagnosis not present

## 2021-11-21 DIAGNOSIS — M0600 Rheumatoid arthritis without rheumatoid factor, unspecified site: Secondary | ICD-10-CM

## 2021-11-21 DIAGNOSIS — I152 Hypertension secondary to endocrine disorders: Secondary | ICD-10-CM

## 2021-11-21 DIAGNOSIS — J341 Cyst and mucocele of nose and nasal sinus: Secondary | ICD-10-CM

## 2021-11-21 DIAGNOSIS — H6121 Impacted cerumen, right ear: Secondary | ICD-10-CM | POA: Diagnosis not present

## 2021-11-21 DIAGNOSIS — M8589 Other specified disorders of bone density and structure, multiple sites: Secondary | ICD-10-CM | POA: Diagnosis not present

## 2021-11-21 DIAGNOSIS — H35039 Hypertensive retinopathy, unspecified eye: Secondary | ICD-10-CM

## 2021-11-21 DIAGNOSIS — H9190 Unspecified hearing loss, unspecified ear: Secondary | ICD-10-CM

## 2021-11-21 DIAGNOSIS — N1832 Chronic kidney disease, stage 3b: Secondary | ICD-10-CM

## 2021-11-21 DIAGNOSIS — E1159 Type 2 diabetes mellitus with other circulatory complications: Secondary | ICD-10-CM | POA: Diagnosis not present

## 2021-11-21 DIAGNOSIS — E1169 Type 2 diabetes mellitus with other specified complication: Secondary | ICD-10-CM

## 2021-11-21 DIAGNOSIS — E785 Hyperlipidemia, unspecified: Secondary | ICD-10-CM

## 2021-11-21 DIAGNOSIS — I700 Atherosclerosis of aorta: Secondary | ICD-10-CM

## 2021-11-21 LAB — POCT GLYCOSYLATED HEMOGLOBIN (HGB A1C): Hemoglobin A1C: 8.8 % — AB (ref 4.0–5.6)

## 2021-11-21 LAB — POCT UA - MICROALBUMIN
Albumin/Creatinine Ratio, Urine, POC: 13.4
Creatinine, POC: 48.5 mg/dL
Microalbumin Ur, POC: 6.5 mg/L

## 2021-11-21 NOTE — Progress Notes (Addendum)
?Subjective:  ? ? Patient ID: Deborah Travis, female    DOB: 02/26/1941, 81 y.o.   MRN: [id number] ? ?Deborah Travis is a 81 y.o. female who presents for follow-up of Type 2 diabetes mellitus. ? ?Home blood sugar records:  fasting 125avg ?Current symptoms/problems include none and have been stable. ?Daily foot checks:yes  Any foot concerns: none at this time  ?Exercise:  staying active  ?Diet:good ?She does complain of a lesion in the left nostril that continues to bother her.  Has been there for several months. ?She is followed by rheumatology and is on hydroxychloroquine as well as [place] and seems to be doing well on that.  She continues on Synthroid and having no trouble with that.  She is taking metformin 1000 mg twice per day.  Continues on lisinopril daily.  She also is on Zenpep for her pancreatic insufficiency.  She has not complained of any abdominal distress.  She does have a history of osteopenia.  She has been taking vitamin D and calcium supplementation.  She does have a history of aortic atherosclerosis.  She also complains of decreased hearing especially on the right. ?The following portions of the patient's history were reviewed and updated as appropriate: allergies, current medications, past medical history, past social history and problem list. ? ?ROS as in subjective above. ? ?   ?Objective:  ?  ?Physical Exam ?Alert and in no distress a small cystic lesion was noted in the left medial nares.  It was incised and removed without difficulty. ?The left TM was normal.  Right TM after lavage was normal. ?Blood pressure 110/68, pulse 68, temperature 97.7 ?F (36.5 ?C), height '5\' 3"'$  (1.6 m), weight 106 lb (48.1 kg), SpO2 98 %. ? ?Lab Review ? ?  Latest Ref Rng & Units 11/21/2021  ? 11:30 AM 11/21/2021  ? 11:29 AM 10/30/2021  ? 11:32 AM 08/01/2021  ? 11:07 AM 06/23/2021  ? 10:44 AM  ?Diabetic Labs  ?HbA1c 4.0 - 5.6 %  8.8     7.0    ?Microalbumin mg/L 6.5        ?Micro/Creat Ratio  13.4        ?Creatinine 0.60  - 1.00 mg/dL   1.03   1.09     ? ? ?  11/21/2021  ? 10:40 AM 10/30/2021  ?  9:56 AM 08/01/2021  ? 10:31 AM 07/06/2021  ?  2:53 PM 06/23/2021  ? 10:26 AM  ?BP/Weight  ?Systolic BP 540 086 761 950 118  ?Diastolic BP 68 85 71 78 70  ?Wt. (Lbs) 106 107.2 107.6 111.6 115  ?BMI 18.78 kg/m2 19.14 kg/m2 19.21 kg/m2 19.93 kg/m2 20.53 kg/m2  ? ? ?  Latest Ref Rng & Units 01/25/2021  ? 10:00 AM 09/19/2020  ? 12:00 AM  ?Foot/eye exam completion dates  ?Eye Exam No Retinopathy  Retinopathy       ?Foot Form Completion  Done   ?  ? This result is from an external source.  ?Hemoglobin A1c is 8.8 ?Hearing evaluation did show high-frequency hearing loss ?Deborah Travis  reports that she quit smoking about 6 years ago. Her smoking use included cigarettes. She smoked an average of .1 packs per day. She has been exposed to tobacco smoke. She has never used smokeless tobacco. She reports that she does not drink alcohol and does not use drugs. ? ?   ?Assessment & Plan:  ?Type 2 diabetes with complication ([place]) - Plan: POCT glycosylated hemoglobin (Hb A1C), POCT  UA - Microalbumin ? ?Seronegative rheumatoid arthritis ([place]) ? ?Hypertensive retinopathy, unspecified laterality ? ?Hypertension associated with diabetes ([place]) ? ?Exocrine pancreatic insufficiency ? ?Hyperlipidemia associated with type 2 diabetes mellitus ([place]) - Plan: Lipid panel ? ?Hypothyroidism, unspecified type - Plan: TSH ? ?Cyst of nasal cavity ? ?Osteopenia of multiple sites - Plan: DG Bone Density ? ?Aortic atherosclerosis ([place]) ? ?Stage 3b chronic kidney disease ([place]) ? ?Diabetic nephropathy associated with type 2 diabetes mellitus ([place]) ? ?Atherosclerosis of aorta ([place]) ? ?Impacted cerumen of right ear ? ?Hearing loss, unspecified hearing loss type, unspecified laterality - Plan: Ambulatory referral to Audiology ?She will continue on her present medication regimen.  She is to check her blood sugars 2 hours after meals as her fasting blood sugars seem to be relatively low .I will consider  placing her on another diabetes medicine managing a better feel for accuracy of the A1c.  Continue to be followed by rheumatology. ?  ?Follow up: In 4 months or sooner depending upon accuracy of the A1c.  May consider placing her on a DPP 4 or possible SGLT ? ? ?  ?

## 2021-11-21 NOTE — Patient Instructions (Signed)
Make sure that your machine has a fresh battery.  Check your blood sugars 2 hours after your meals for the next couple of weeks and keep me informed.  Send me a message on MyChart with your blood sugar readings 2 hours after your meals ?

## 2021-11-22 ENCOUNTER — Other Ambulatory Visit: Payer: Self-pay | Admitting: Family Medicine

## 2021-11-22 DIAGNOSIS — Z79899 Other long term (current) drug therapy: Secondary | ICD-10-CM | POA: Diagnosis not present

## 2021-11-22 DIAGNOSIS — E1169 Type 2 diabetes mellitus with other specified complication: Secondary | ICD-10-CM

## 2021-11-22 DIAGNOSIS — H40021 Open angle with borderline findings, high risk, right eye: Secondary | ICD-10-CM | POA: Diagnosis not present

## 2021-11-22 DIAGNOSIS — E119 Type 2 diabetes mellitus without complications: Secondary | ICD-10-CM | POA: Diagnosis not present

## 2021-11-22 DIAGNOSIS — H401121 Primary open-angle glaucoma, left eye, mild stage: Secondary | ICD-10-CM | POA: Diagnosis not present

## 2021-11-22 DIAGNOSIS — E1159 Type 2 diabetes mellitus with other circulatory complications: Secondary | ICD-10-CM

## 2021-11-22 LAB — HM DIABETES EYE EXAM

## 2021-11-22 NOTE — Addendum Note (Signed)
Addended by: [name] on: 11/22/2021 01:18 PM ? ? Modules accepted: Orders ? ?

## 2021-11-23 ENCOUNTER — Encounter: Payer: Self-pay | Admitting: Family Medicine

## 2021-11-24 ENCOUNTER — Encounter: Payer: Self-pay | Admitting: Family Medicine

## 2021-11-25 ENCOUNTER — Encounter: Payer: Self-pay | Admitting: Family Medicine

## 2021-11-26 ENCOUNTER — Encounter: Payer: Self-pay | Admitting: Family Medicine

## 2021-11-27 ENCOUNTER — Telehealth (INDEPENDENT_AMBULATORY_CARE_PROVIDER_SITE_OTHER): Payer: Medicare HMO | Admitting: Family Medicine

## 2021-11-27 DIAGNOSIS — K8681 Exocrine pancreatic insufficiency: Secondary | ICD-10-CM | POA: Diagnosis not present

## 2021-11-27 DIAGNOSIS — E118 Type 2 diabetes mellitus with unspecified complications: Secondary | ICD-10-CM

## 2021-11-27 NOTE — Progress Notes (Signed)
? ?  Subjective:  ? ? Patient ID: Deborah Travis, female    DOB: May 17, 1941, 81 y.o.   MRN: [id number] ? ?HPI ?Documentation for virtual audio  telecommunications through [place] encounter.  Unable to connect for video conference. ?The patient was located at home. 2 patient identifiers used.  ?The provider was located in the office. ?The patient did consent to this visit and is aware of possible charges through their insurance for this visit. ?The other persons participating in this telemedicine service were none. ?Time spent on call was 5 minutes and in review of previous records >20 minutes total for counseling and coordination of care. ?This virtual service is not related to other E/M service within previous 7 days.  ?She did send her to our postprandial blood sugars for the last 3 days.  The highest number she got was 183..  She then said that she checked her blood sugar this morning before meal and it was in the low 100s and after the same breakfast as the previous ones she said her blood sugar was 210. ?She also complains of continued difficulty with diarrhea but states that this started when she had more difficulty with her pancreatic insufficiency manifesting as diarrhea.  She states that the Zenpep has really cut down on that but not taken it away completely. ? ?Review of Systems ? ?   ?Objective:  ? Physical Exam ?Alert and in no distress otherwise not examined ? ? ? ?   ?Assessment & Plan:  ?Exocrine pancreatic insufficiency ? ?Type 2 diabetes with complication (HCC) ?I explained that since the diarrhea really started when she had the pancreatic insufficiency, probably unrelated to the metformin.  I will have her take 4 of the capsules before each meal and see if that will help further with her diarrhea which to me sounds like it is related to the pancreatic insufficiency.  She is also due to get me her fasting morning blood sugars. ? ?

## 2021-12-11 ENCOUNTER — Encounter: Payer: Self-pay | Admitting: Family Medicine

## 2021-12-12 ENCOUNTER — Other Ambulatory Visit: Payer: Self-pay

## 2021-12-12 ENCOUNTER — Other Ambulatory Visit: Payer: Self-pay | Admitting: Physician Assistant

## 2021-12-12 ENCOUNTER — Telehealth: Payer: Self-pay | Admitting: Rheumatology

## 2021-12-12 ENCOUNTER — Telehealth: Payer: Self-pay

## 2021-12-12 DIAGNOSIS — M0600 Rheumatoid arthritis without rheumatoid factor, unspecified site: Secondary | ICD-10-CM

## 2021-12-12 DIAGNOSIS — K8681 Exocrine pancreatic insufficiency: Secondary | ICD-10-CM

## 2021-12-12 MED ORDER — OMEPRAZOLE 40 MG PO CPDR: 40.0000 mg | DELAYED_RELEASE_CAPSULE | Freq: Every day | ORAL | 0 refills | Status: DC

## 2021-12-12 NOTE — Telephone Encounter (Signed)
Patient called the office requesting a refill of Hydroxychloroquine '200mg'$  be sent to [facility]. Patient requests 90 day supply if possible.  ?

## 2021-12-12 NOTE — Telephone Encounter (Signed)
Received fax from [facility] requesting refill Omeprazole 40 mg 90 days

## 2021-12-12 NOTE — Telephone Encounter (Signed)
Patient advised Prescription sent 11/16/2021. Patient will reach out to the pharmacy. She will call the office if she has any trouble.  ?

## 2021-12-12 NOTE — Telephone Encounter (Signed)
Done KH 

## 2021-12-13 NOTE — Telephone Encounter (Signed)
Deborah Travis refilled

## 2021-12-13 NOTE — Telephone Encounter (Signed)
Next Visit: 01/30/2022 ?  ?Last Visit: 10/30/2021 ?  ?Labs: 10/30/2021 Glucose is 119.  Creatinine is borderline elevated-1.03 and GFR is borderline low-56 but improving.  Rest of CMP WNL ? ?Current Dose per office note 10/30/2021: Arava 20 mg 1 tablet by mouth daily ? ?CK:[id number] rheumatoid arthritis  ?  ?Last Fill: 08/04/2021 ? ?Okay to refill Arava?  ?

## 2021-12-28 ENCOUNTER — Telehealth: Payer: Self-pay | Admitting: Family Medicine

## 2021-12-28 NOTE — Telephone Encounter (Signed)
Referral followup 

## 2022-01-14 ENCOUNTER — Other Ambulatory Visit: Payer: Self-pay | Admitting: Family Medicine

## 2022-01-18 ENCOUNTER — Other Ambulatory Visit: Payer: Self-pay | Admitting: Family Medicine

## 2022-01-18 ENCOUNTER — Encounter: Payer: Self-pay | Admitting: Family Medicine

## 2022-01-18 DIAGNOSIS — K8681 Exocrine pancreatic insufficiency: Secondary | ICD-10-CM

## 2022-01-18 NOTE — Progress Notes (Signed)
Office Visit Note  Patient: Deborah Travis             Date of Birth: 03-17-1941           MRN: [id number]             PCP: [name], MD Referring: [name], MD Visit Date: 01/30/2022 Occupation: '@GUAROCC'$ @  Subjective:  Medication management  History of Present Illness: Deborah Travis is a 81 y.o. female with history of seronegative rheumatoid arthritis, osteoarthritis, degenerative disc disease and osteopenia.  She states that she has been doing well on leflunomide and hydroxychloroquine combination.  She denies any side effects from the medications.  She denies any joint pain or some joint swelling.  She has some stiffness in her hands due to underlying osteoarthritis.  She states that she has been having chronic diarrhea for many years since she started taking metformin.  She was recently switched from metformin to extended release metformin.  She has been having elevated blood glucose levels.  She has noticed improvement in diarrhea since she changed to extended release metformin.  Activities of Daily Living:  Patient reports morning stiffness for 24 hours.   Patient Denies nocturnal pain.  Difficulty dressing/grooming: Reports Difficulty climbing stairs: Denies Difficulty getting out of chair: Denies Difficulty using hands for taps, buttons, cutlery, and/or writing: Reports  Review of Systems  Constitutional:  Negative for fatigue.  HENT:  Negative for mouth dryness.   Eyes:  Negative for dryness.  Respiratory:  Negative for shortness of breath.   Cardiovascular:  Negative for swelling in legs/feet.  Gastrointestinal:  Positive for diarrhea.  Endocrine: Positive for cold intolerance.  Genitourinary:  Negative for difficulty urinating.  Musculoskeletal:  Positive for joint pain, joint pain and morning stiffness. Negative for joint swelling.  Skin:  Negative for rash.  Allergic/Immunologic: Negative for susceptible to infections.  Neurological:  Negative for  numbness.  Hematological:  Positive for bruising/bleeding tendency.  Psychiatric/Behavioral:  Negative for sleep disturbance.    PMFS History:  Patient Active Problem List   Diagnosis Date Noted   Aortic atherosclerosis ([place]) 06/23/2021   Diabetic nephropathy associated with type 2 diabetes mellitus ([place]) 02/06/2021   Stage 3b chronic kidney disease ([place]) 02/06/2021   Chronic calcific pancreatitis ([place]) 08/12/2020   Weight loss 12/18/2019   Abdominal tenderness 12/18/2019   Atherosclerosis 08/08/2018   Glaucoma 05/26/2018   Hypertensive retinopathy 05/26/2018   Seronegative rheumatoid arthritis ([place]) 03/04/2018   Senile purpura ([place]) 02/01/2015   Former smoker 10/12/2013   History of peptic ulcer disease 07/13/2013   Osteopenia 05/07/2013   Hypothyroidism 01/12/2012   Type 2 diabetes with complication ([place]) 01/77/9390   Hypertension associated with diabetes ([place]) 05/21/2011   Exocrine pancreatic insufficiency 05/21/2011   Hyperlipidemia associated with type 2 diabetes mellitus ([place]) 05/21/2011    Past Medical History:  Diagnosis Date   Chronic kidney disease    RENAL STONE   Chronic pancreatitis ([facility])    Diabetes mellitus    Duodenal ulcer    Dyslipidemia    GERD (gastroesophageal reflux disease)    Hypertension    Osteopenia    Thyroid disease    HYPOTHYROID   Vitamin D deficiency     Family History  Problem Relation Age of Onset   Stroke Mother    Heart disease Father    Healthy Daughter    Healthy Daughter    Emphysema Daughter    Diabetes Daughter    Hernia Daughter  Past Surgical History:  Procedure Laterality Date   APPENDECTOMY     CATARACT EXTRACTION Right 2014   CHOLECYSTECTOMY     TONSILECTOMY, ADENOIDECTOMY, BILATERAL MYRINGOTOMY AND TUBES     WHIPPLE PROCEDURE  1998   Social History   Social History Narrative   Not on file   Immunization History  Administered Date(s) Administered   Fluad Quad(high Dose 65+) 06/01/2019, 05/10/2020,  06/23/2021   Influenza Split 05/21/2011, 06/12/2012, 07/15/2013   Influenza, High Dose Seasonal PF 07/13/2013, 07/01/2014, 06/01/2015, 06/15/2016, 04/15/2017, 05/19/2018   PFIZER Comirnaty(Gray Top)Covid-19 Tri-Sucrose Vaccine 01/25/2021   PFIZER(Purple Top)SARS-COV-2 Vaccination 09/18/2019, 10/09/2019   Pneumococcal Conjugate-13 06/15/2016   Pneumococcal Polysaccharide-23 06/12/2012   Tdap 06/28/2011   Zoster, Live 06/27/2013     Objective: Vital Signs: BP 129/80 (BP Location: Left Arm, Patient Position: Sitting, Cuff Size: Small)   Pulse 82   Resp 13   Ht '5\' 3"'$  (1.6 m)   Wt 101 lb 9.6 oz (46.1 kg)   LMP  (LMP Unknown)   BMI 18.00 kg/m    Physical Exam Vitals and nursing note reviewed.  Constitutional:      Appearance: She is well-developed.  HENT:     Head: Normocephalic and atraumatic.  Eyes:     Conjunctiva/sclera: Conjunctivae normal.  Cardiovascular:     Rate and Rhythm: Normal rate and regular rhythm.     Heart sounds: Normal heart sounds.  Pulmonary:     Effort: Pulmonary effort is normal.     Breath sounds: Normal breath sounds.  Abdominal:     General: Bowel sounds are normal.     Palpations: Abdomen is soft.  Musculoskeletal:     Cervical back: Normal range of motion.  Lymphadenopathy:     Cervical: No cervical adenopathy.  Skin:    General: Skin is warm and dry.     Capillary Refill: Capillary refill takes less than 2 seconds.  Neurological:     Mental Status: She is alert and oriented to person, place, and time.  Psychiatric:        Behavior: Behavior normal.     Musculoskeletal Exam: C-spine was in good range of motion.  Shoulder joints, elbow joints, wrist joints, MCPs PIPs and DIPs and good range of motion with no synovitis.  She had bilateral CMC PIP and DIP thickening consistent with osteoarthritis.  Hip joints and knee joints in good range of motion.  She had no tenderness over ankles or MTPs.  CDAI Exam: CDAI Score: 0.4  Patient Global: 2 mm;  Provider Global: 2 mm Swollen: 0 ; Tender: 0  Joint Exam 01/30/2022   No joint exam has been documented for this visit   There is currently no information documented on the homunculus. Go to the Rheumatology activity and complete the homunculus joint exam.  Investigation: No additional findings.  Imaging: No results found.  Recent Labs: Lab Results  Component Value Date   WBC 6.6 10/16/2021   HGB 12.0 10/16/2021   PLT 313 10/16/2021   NA 138 10/30/2021   K 4.6 10/30/2021   CL 102 10/30/2021   CO2 25 10/30/2021   GLUCOSE 119 (H) 10/30/2021   BUN 19 10/30/2021   CREATININE 1.03 (H) 10/30/2021   BILITOT 0.3 10/30/2021   ALKPHOS 77 03/09/2020   AST 18 10/30/2021   ALT 10 10/30/2021   PROT 7.4 10/30/2021   ALBUMIN 3.4 (L) 03/09/2020   CALCIUM 10.0 10/30/2021   GFRAA 40 (L) 01/27/2021   QFTBGOLDPLUS NEGATIVE 03/30/2019  Speciality Comments: PLQ Eye Exam: 11/22/2021 WNL @ [facility] Follow up in 6 months   Procedures:  No procedures performed Allergies: Levofloxacin and Codeine   Assessment / Plan:     Visit Diagnoses: Seronegative rheumatoid arthritis ([place]) - RF-,CCP-, 14-3-3 eta negative, Uric acid 4.8, ANA negative, no erosive changes on XR of hands.  She is doing well on current combination of leflunomide 20 mg p.o. daily and hydroxychloroquine 200 mg p.o. daily Monday to Friday and half a tablet on Saturday and Sunday.  I discussed decreasing hydroxychloroquine to only 1 tablet p.o. Monday to Friday.  We may be able to taper Plaquenil further.  She is hesitant to decrease the dose as she has severe arthritis in the past.  I plan to obtain x-rays at the next visit per patient's request.  High risk medication use - Arava 20 mg 1 tablet by mouth daily, Plaquenil 200 mg 1 tablet by mouth daily Monday through Friday and half tablet on Saturday and sundays.  Labs obtained on October 16, 2021 CBC was normal.  October 30, 2021 creatinine was mildly elevated but stable.  LFTs  were normal.- Plan: CBC with Differential/Platelet, COMPLETE METABOLIC PANEL WITH GFR today and then every 3 months to monitor for drug toxicity.  She was advised to hold leflunomide in case she develops an infection and resume after the infection resolves.  Information regarding immunization was placed in the AVS.  Trigger little finger of right hand-resolved  Trigger finger, right ring finger-resolved  DDD (degenerative disc disease), cervical-she has some stiffness in her cervical region.  Osteopenia of multiple sites - DEXA on 02/18/18: The BMD measured at Femur Total Right is 0.700 g/cm2 with a T-score of -2.4.  Order placed by Dr. Redmond School, patient has not had repeat DEXA scan yet.  Vitamin D deficiency-use of vitamin D was discussed.  Other medical problems are listed as follows:  Senile purpura ([place])  Hypertension associated with diabetes ([place])  History of chronic pancreatitis  Hyperlipidemia associated with type 2 diabetes mellitus ([place])  History of peptic ulcer disease  Chronic diarrhea-patient gives history of chronic diarrhea which she relates to metformin use.  She has noticed improvement in diarrhea since she has been on metformin XR.  History of gastroesophageal reflux (GERD)  History of hypothyroidism  Former smoker  Orders: Orders Placed This Encounter  Procedures   CBC with Differential/Platelet   COMPLETE METABOLIC PANEL WITH GFR   No orders of the defined types were placed in this encounter.    Follow-Up Instructions: Return in about 5 months (around 07/02/2022) for Rheumatoid arthritis.   [name], MD  Note - This record has been created using Editor, commissioning.  Chart creation errors have been sought, but may not always  have been located. Such creation errors do not reflect on  the standard of medical care.

## 2022-01-19 ENCOUNTER — Other Ambulatory Visit: Payer: Self-pay | Admitting: Family Medicine

## 2022-01-19 MED ORDER — METFORMIN HCL ER (OSM) 1000 MG PO TB24
1000.0000 mg | ORAL_TABLET | Freq: Every day | ORAL | 1 refills | Status: DC
Start: 2022-01-19 — End: 2022-01-25

## 2022-01-24 ENCOUNTER — Telehealth: Payer: Self-pay

## 2022-01-24 ENCOUNTER — Encounter: Payer: Self-pay | Admitting: Family Medicine

## 2022-01-24 NOTE — Telephone Encounter (Signed)
Spoke to [employer] at [place] and she advised pt metformin was on high cost hold for the 1000 mg er . It would cost the pt $250.00. She advised that the 500 er with the pt taking two tablets would be cheaper. Please advise [facility]

## 2022-01-25 MED ORDER — METFORMIN HCL ER 500 MG PO TB24: ORAL_TABLET | ORAL | 1 refills | Status: DC

## 2022-01-29 ENCOUNTER — Ambulatory Visit (INDEPENDENT_AMBULATORY_CARE_PROVIDER_SITE_OTHER): Payer: Medicare HMO | Admitting: Family Medicine

## 2022-01-29 ENCOUNTER — Encounter: Payer: Self-pay | Admitting: Family Medicine

## 2022-01-29 VITALS — BP 110/60 | HR 96 | Temp 99.0°F | Wt 102.6 lb

## 2022-01-29 DIAGNOSIS — J3089 Other allergic rhinitis: Secondary | ICD-10-CM | POA: Diagnosis not present

## 2022-01-29 DIAGNOSIS — J029 Acute pharyngitis, unspecified: Secondary | ICD-10-CM

## 2022-01-29 NOTE — Progress Notes (Unsigned)
   Subjective:    Patient ID: Deborah Travis, female    DOB: Mar 09, 1941, 81 y.o.   MRN: [id number]  HPI She is here for evaluation of a sore throat and also a cough.  The sore throat has been there for 4 days with no fever, chills, earache.  Coughing has been there for 2 years.  She has been using Benadryl to help with that.  She also has associated itchy watery eyes, rhinorrhea and occasional sneezing.   Review of Systems     Objective:   Physical Exam Alert and in no distress. Tympanic membranes and canals are normal. Pharyngeal area is normal. Neck is supple without adenopathy or thyromegaly. Cardiac exam shows a regular sinus rhythm without murmurs or gallops. Lungs are clear to auscultation.        Assessment & Plan:  Sore throat - Plan: Rapid Strep A, POC COVID-19  Non-seasonal allergic rhinitis due to other allergic trigger Both tests were negative.  Supportive care for the sore throat but did recommend either Flonase or Rhinocort for the allergies as well as Claritin or Allegra and recheck this in 3 weeks.

## 2022-01-29 NOTE — Patient Instructions (Signed)
Use either Rhinocort or Flonase regularly for your allergies and also take either Claritin or Allegra.  Return here in about 3 weeks for recheck on the allergies and the coughing.

## 2022-01-30 ENCOUNTER — Ambulatory Visit: Payer: Medicare HMO | Admitting: Rheumatology

## 2022-01-30 ENCOUNTER — Encounter: Payer: Self-pay | Admitting: Rheumatology

## 2022-01-30 VITALS — BP 129/80 | HR 82 | Resp 13 | Ht 63.0 in | Wt 101.6 lb

## 2022-01-30 DIAGNOSIS — E559 Vitamin D deficiency, unspecified: Secondary | ICD-10-CM | POA: Diagnosis not present

## 2022-01-30 DIAGNOSIS — E1159 Type 2 diabetes mellitus with other circulatory complications: Secondary | ICD-10-CM

## 2022-01-30 DIAGNOSIS — D692 Other nonthrombocytopenic purpura: Secondary | ICD-10-CM | POA: Diagnosis not present

## 2022-01-30 DIAGNOSIS — M8589 Other specified disorders of bone density and structure, multiple sites: Secondary | ICD-10-CM

## 2022-01-30 DIAGNOSIS — K529 Noninfective gastroenteritis and colitis, unspecified: Secondary | ICD-10-CM

## 2022-01-30 DIAGNOSIS — Z8711 Personal history of peptic ulcer disease: Secondary | ICD-10-CM

## 2022-01-30 DIAGNOSIS — Z8719 Personal history of other diseases of the digestive system: Secondary | ICD-10-CM

## 2022-01-30 DIAGNOSIS — M65351 Trigger finger, right little finger: Secondary | ICD-10-CM

## 2022-01-30 DIAGNOSIS — M65341 Trigger finger, right ring finger: Secondary | ICD-10-CM

## 2022-01-30 DIAGNOSIS — Z87891 Personal history of nicotine dependence: Secondary | ICD-10-CM

## 2022-01-30 DIAGNOSIS — E785 Hyperlipidemia, unspecified: Secondary | ICD-10-CM

## 2022-01-30 DIAGNOSIS — M5030 Other cervical disc degeneration, unspecified cervical region: Secondary | ICD-10-CM

## 2022-01-30 DIAGNOSIS — E1169 Type 2 diabetes mellitus with other specified complication: Secondary | ICD-10-CM | POA: Diagnosis not present

## 2022-01-30 DIAGNOSIS — M0600 Rheumatoid arthritis without rheumatoid factor, unspecified site: Secondary | ICD-10-CM

## 2022-01-30 DIAGNOSIS — Z79899 Other long term (current) drug therapy: Secondary | ICD-10-CM | POA: Diagnosis not present

## 2022-01-30 DIAGNOSIS — Z8639 Personal history of other endocrine, nutritional and metabolic disease: Secondary | ICD-10-CM

## 2022-01-30 DIAGNOSIS — I152 Hypertension secondary to endocrine disorders: Secondary | ICD-10-CM

## 2022-01-30 LAB — POCT RAPID STREP A (OFFICE): Rapid Strep A Screen: NEGATIVE

## 2022-01-30 LAB — POC COVID19 BINAXNOW: SARS Coronavirus 2 Ag: NEGATIVE

## 2022-01-30 NOTE — Patient Instructions (Signed)
Standing Labs We placed an order today for your standing lab work.   Please have your standing labs drawn in September and every 3 months  If possible, please have your labs drawn 2 weeks prior to your appointment so that the provider can discuss your results at your appointment.  Please note that you may see your imaging and lab results in MyChart before we have reviewed them. We may be awaiting multiple results to interpret others before contacting you. Please allow our office up to 72 hours to thoroughly review all of the results before contacting the office for clarification of your results.  We have open lab daily: Monday through Thursday from 1:30-4:30 PM and Friday from 1:30-4:00 PM at the office of Dr. Harbert Fitterer, [facility].   Please be advised, all patients with office appointments requiring lab work will take precedent over walk-in lab work.  If possible, please come for your lab work on Monday and Friday afternoons, as you may experience shorter wait times. The office is located at [address], [address], [place], [place] [address] No appointment is necessary.   Labs are drawn by [facility]. Please bring your co-pay at the time of your lab draw.  You may receive a bill from [facility] for your lab work.  Please note if you are on Hydroxychloroquine and and an order has been placed for a Hydroxychloroquine level, you will need to have it drawn 4 hours or more after your last dose.  If you wish to have your labs drawn at another location, please call the office 24 hours in advance to send orders.  If you have any questions regarding directions or hours of operation,  please call [phone].   As a reminder, please drink plenty of water prior to coming for your lab work. Thanks!   Vaccines You are taking a medication(s) that can suppress your immune system.  The following immunizations are recommended: Flu annually Covid-19  Td/Tdap (tetanus, diphtheria,  pertussis) every 10 years Pneumonia (Prevnar 15 then Pneumovax 23 at least 1 year apart.  Alternatively, can take Prevnar 20 without needing additional dose) Shingrix: 2 doses from 4 weeks to 6 months apart  Please check with your PCP to make sure you are up to date.   If you have signs or symptoms of an infection or start antibiotics: First, call your PCP for workup of your infection. Hold your medication through the infection, until you complete your antibiotics, and until symptoms resolve if you take the following: Injectable medication (Actemra, Benlysta, Cimzia, Cosentyx, Enbrel, Humira, Kevzara, Orencia, Remicade, Simponi, Stelara, Taltz, Tremfya) Methotrexate Leflunomide (Arava) Mycophenolate (Cellcept) Xeljanz, Olumiant, or Rinvoq  

## 2022-01-31 ENCOUNTER — Ambulatory Visit: Payer: Medicare HMO | Attending: Audiologist | Admitting: Audiologist

## 2022-01-31 DIAGNOSIS — H903 Sensorineural hearing loss, bilateral: Secondary | ICD-10-CM | POA: Insufficient documentation

## 2022-01-31 LAB — CBC WITH DIFFERENTIAL/PLATELET
Absolute Monocytes: 853 cells/uL (ref 200–950)
Basophils Absolute: 47 cells/uL (ref 0–200)
Basophils Relative: 0.6 %
Eosinophils Absolute: 221 cells/uL (ref 15–500)
Eosinophils Relative: 2.8 %
HCT: 38 % (ref 35.0–45.0)
Hemoglobin: 12.5 g/dL (ref 11.7–15.5)
Lymphs Abs: 2307 cells/uL (ref 850–3900)
MCH: 30.7 pg (ref 27.0–33.0)
MCHC: 32.9 g/dL (ref 32.0–36.0)
MCV: 93.4 fL (ref 80.0–100.0)
MPV: 9.8 fL (ref 7.5–12.5)
Monocytes Relative: 10.8 %
Neutro Abs: 4471 cells/uL (ref 1500–7800)
Neutrophils Relative %: 56.6 %
Platelets: 274 10*3/uL (ref 140–400)
RBC: 4.07 10*6/uL (ref 3.80–5.10)
RDW: 12.4 % (ref 11.0–15.0)
Total Lymphocyte: 29.2 %
WBC: 7.9 10*3/uL (ref 3.8–10.8)

## 2022-01-31 LAB — COMPLETE METABOLIC PANEL WITH GFR
AG Ratio: 1.3 (calc) (ref 1.0–2.5)
ALT: 13 U/L (ref 6–29)
AST: 15 U/L (ref 10–35)
Albumin: 4.1 g/dL (ref 3.6–5.1)
Alkaline phosphatase (APISO): 149 U/L (ref 37–153)
BUN/Creatinine Ratio: 18 (calc) (ref 6–22)
BUN: 19 mg/dL (ref 7–25)
CO2: 26 mmol/L (ref 20–32)
Calcium: 10.3 mg/dL (ref 8.6–10.4)
Chloride: 99 mmol/L (ref 98–110)
Creat: 1.05 mg/dL — ABNORMAL HIGH (ref 0.60–1.00)
Globulin: 3.2 g/dL (calc) (ref 1.9–3.7)
Glucose, Bld: 368 mg/dL — ABNORMAL HIGH (ref 65–99)
Potassium: 5.2 mmol/L (ref 3.5–5.3)
Sodium: 134 mmol/L — ABNORMAL LOW (ref 135–146)
Total Bilirubin: 0.4 mg/dL (ref 0.2–1.2)
Total Protein: 7.3 g/dL (ref 6.1–8.1)
eGFR: 54 mL/min/{1.73_m2} — ABNORMAL LOW (ref >=60)

## 2022-01-31 NOTE — Procedures (Signed)
  Outpatient Audiology and [place], [place]  [id number] [phone]  AUDIOLOGICAL  EVALUATION  NAME: Deborah Travis     DOB:   Aug 09, 1941      MRN: [id number]                                                                                     DATE: 01/31/2022     REFERENT: [name], MD STATUS: Outpatient DIAGNOSIS: Sensorineural Hearing Loss Bilaterally    History: Katha was seen for an audiological evaluation.  Elliott is receiving a hearing evaluation due to concerns for difficulty understanding people. Shannette has difficulty hearing in background noise, when her family is together for parties, and when people are at a distance. This difficulty began gradually. Twelve years ago she had sudden hearing loss in her right ear for which she saw a doctor. No diagnosis given. No sudden changes in hearing since. She feels her left ear has slowly started to decline as well. No pain or pressure reported in either ear. Tinnitus present in the right ear sounding like a buzz.  Medical history positive for kidney disease and diabetes which are risk factors for hearing loss. No other relevant case history reported.   Evaluation:  Otoscopy showed a clear view of the tympanic membranes, bilaterally Tympanometry results were consistent with slight negative pressure in the right ear and normal middle ear function in the left ear.  Audiometric testing was completed using conventional audiometry with insert and supraural transducer. Speech Recognition Thresholds were 60dB in the right ear and 45dB in the left ear. Right ear SRT not consistent with pure tone average but testing still considered reliable. Word Recognition was 100% in the left ear and bilaterally at 85dB. WRS 56% at 90dB in the right ear. Pure tone thresholds shows mild sloping to severe sensorineural hearing loss in both ears. Test results are consistent with age related change with asymmetry in her speech  understanding.   Results:  The test results were reviewed with Hannah. Dorrie has mild sloping to severe sensorineural hearing loss in both ears. She will not her high pitched consonants that make speech clear. She is likely reading lips to help understand people. She still has good word recognition when the hears are working together. Taheera was given copies of her audiogram and a list of local hearing aid providers. All her questions about hearing aids and the hearing loss were addressed.    Recommendations: Amplification is necessary for both ears. Hearing aids can be purchased from a variety of locations. See provided list for locations in the [place] area.   37 minutes spent testing and counseling on results.   Alfonse Alpers  Audiologist, Au.D., CCC-A 01/31/2022  2:50 PM  Cc: [name], MD

## 2022-01-31 NOTE — Progress Notes (Signed)
CBC is normal, CMP shows elevated glucose at 368.  Creatinine is elevated and stable.  Please notify patient.  I will forward results to Dr. Redmond School.

## 2022-02-06 ENCOUNTER — Encounter: Payer: Self-pay | Admitting: Family Medicine

## 2022-02-13 ENCOUNTER — Telehealth: Payer: Self-pay | Admitting: Family Medicine

## 2022-02-13 NOTE — Telephone Encounter (Signed)
Left message for patient to call back and schedule Medicare Annual Wellness Visit (AWV) either virtually or in office. I left my number for patient to call [phone].  Last AWV 05/10/20 ; please schedule at anytime with health coach  I wanted to see if patient would schedule AWV with Pamala Hurry prior to appt with Dr Redmond School 03/09/22

## 2022-02-21 ENCOUNTER — Encounter: Payer: Self-pay | Admitting: Family Medicine

## 2022-02-24 ENCOUNTER — Other Ambulatory Visit: Payer: Self-pay | Admitting: Physician Assistant

## 2022-02-24 DIAGNOSIS — M0600 Rheumatoid arthritis without rheumatoid factor, unspecified site: Secondary | ICD-10-CM

## 2022-02-26 NOTE — Telephone Encounter (Signed)
Next Visit: 07/03/2022  Last Visit: 01/30/2022  Labs: 01/30/2022, CBC is normal, CMP shows elevated glucose at 368.  Creatinine is elevated and stable.  Please notify patient.  I will forward results to Dr. Redmond School.  Eye exam: 11/22/2021   Current Dose per office note 01/30/2022: Plaquenil 200 mg 1 tablet by mouth daily Monday through Friday and half tablet on Saturday and sundays  FQ:[id number] rheumatoid arthritis   Last Fill: 11/16/2021  Okay to refill Plaquenil?

## 2022-03-09 ENCOUNTER — Ambulatory Visit (INDEPENDENT_AMBULATORY_CARE_PROVIDER_SITE_OTHER): Payer: Medicare HMO | Admitting: Family Medicine

## 2022-03-09 ENCOUNTER — Encounter: Payer: Self-pay | Admitting: Family Medicine

## 2022-03-09 VITALS — BP 114/72 | HR 73 | Temp 97.3°F | Ht 63.0 in | Wt 101.4 lb

## 2022-03-09 DIAGNOSIS — H35039 Hypertensive retinopathy, unspecified eye: Secondary | ICD-10-CM | POA: Diagnosis not present

## 2022-03-09 DIAGNOSIS — K8681 Exocrine pancreatic insufficiency: Secondary | ICD-10-CM | POA: Diagnosis not present

## 2022-03-09 DIAGNOSIS — E1169 Type 2 diabetes mellitus with other specified complication: Secondary | ICD-10-CM

## 2022-03-09 DIAGNOSIS — E785 Hyperlipidemia, unspecified: Secondary | ICD-10-CM | POA: Diagnosis not present

## 2022-03-09 DIAGNOSIS — E118 Type 2 diabetes mellitus with unspecified complications: Secondary | ICD-10-CM | POA: Diagnosis not present

## 2022-03-09 DIAGNOSIS — J3089 Other allergic rhinitis: Secondary | ICD-10-CM | POA: Diagnosis not present

## 2022-03-09 DIAGNOSIS — M8589 Other specified disorders of bone density and structure, multiple sites: Secondary | ICD-10-CM | POA: Diagnosis not present

## 2022-03-09 DIAGNOSIS — N1832 Chronic kidney disease, stage 3b: Secondary | ICD-10-CM

## 2022-03-09 DIAGNOSIS — Z0000 Encounter for general adult medical examination without abnormal findings: Secondary | ICD-10-CM

## 2022-03-09 DIAGNOSIS — E1121 Type 2 diabetes mellitus with diabetic nephropathy: Secondary | ICD-10-CM

## 2022-03-09 DIAGNOSIS — M0600 Rheumatoid arthritis without rheumatoid factor, unspecified site: Secondary | ICD-10-CM

## 2022-03-09 DIAGNOSIS — I152 Hypertension secondary to endocrine disorders: Secondary | ICD-10-CM

## 2022-03-09 DIAGNOSIS — I700 Atherosclerosis of aorta: Secondary | ICD-10-CM | POA: Diagnosis not present

## 2022-03-09 DIAGNOSIS — E1159 Type 2 diabetes mellitus with other circulatory complications: Secondary | ICD-10-CM | POA: Diagnosis not present

## 2022-03-09 LAB — POCT GLYCOSYLATED HEMOGLOBIN (HGB A1C): Hemoglobin A1C: 11.7 % — AB (ref 4.0–5.6)

## 2022-03-09 MED ORDER — TOUJEO MAX SOLOSTAR 300 UNIT/ML ~~LOC~~ SOPN: 10.0000 [IU] | PEN_INJECTOR | Freq: Every day | SUBCUTANEOUS | 1 refills | Status: AC

## 2022-03-09 NOTE — Progress Notes (Signed)
Deborah Travis is a 81 y.o. female who presents for annual wellness visit and follow-up on chronic medical conditions.  She has recently started taking CBD due to trying to get help with her sleep.  She is also noted a slight improvement of her arthritis type symptoms.  She is taking metformin twice per day however has not been checking her blood sugars regularly.  She continues on lisinopril for her blood pressure.  Continues on Pravachol and having no aches or pains with that.  She does have a history of aortic atherosclerosis.  She has been on thyroid medicine and is doing well on that.  She has had a previous Whipple procedure for chronic pancreatitis and now is on Zenpep and doing well on that.  She sees rheumatology and is now on Plaquenil as well as [place] and seems to be handling it fairly well.  Reflux is being taken care of with Prilosec.  She does have osteopenia and is getting vitamin D and calcium.  She sees ophthalmology regularly.  Immunizations and Health Maintenance Immunization History  Administered Date(s) Administered   Fluad Quad(high Dose 65+) 06/01/2019, 05/10/2020, 06/23/2021   Influenza Split 05/21/2011, 06/12/2012, 07/15/2013   Influenza, High Dose Seasonal PF 07/13/2013, 07/01/2014, 06/01/2015, 06/15/2016, 04/15/2017, 05/19/2018   PFIZER Comirnaty(Gray Top)Covid-19 Tri-Sucrose Vaccine 01/25/2021   PFIZER(Purple Top)SARS-COV-2 Vaccination 09/18/2019, 10/09/2019   Pneumococcal Conjugate-13 06/15/2016   Pneumococcal Polysaccharide-23 06/12/2012   Tdap 06/28/2011   Zoster, Live 06/27/2013   Health Maintenance Due  Topic Date Due   TETANUS/TDAP  06/27/2021    Last Pap smear: aged out  Last mammogram:  05/14/13 Last colonoscopy:aged out  10/01/18 Last DEXA: 02/18/18 Dentist: Q year Ophtho: Q  six months  Exercise: seven days a week  Other doctors caring for patient include: Dr. Kathi Ludwig rheumatology             Dr. Liam Rogers opthalmology              Advanced  directives: Does Patient Have a Medical Advance Directive?: Yes Type of Advance Directive: Living will, Healthcare Power of Attorney Does patient want to make changes to medical advance directive?: No - Patient declined [place] in Chart?: Yes - validated most recent copy scanned in chart (See row information)  Depression screen:  See questionnaire below.     03/09/2022   10:32 AM 11/21/2021   10:37 AM 06/23/2021   10:17 AM 05/10/2020    9:14 AM 11/17/2018    8:16 AM  Depression screen PHQ 2/9  Decreased Interest 0 0 0 0 0  Down, Depressed, Hopeless 0 0 0 0 0  PHQ - 2 Score 0 0 0 0 0    Fall Risk Screen: see questionnaire below.    03/09/2022   10:31 AM 11/21/2021   10:33 AM 05/10/2020    9:14 AM 11/17/2018    8:15 AM 05/19/2018    8:08 AM  Fall Risk   Falls in the past year? 0 0 0 0 No  Number falls in past yr: 0 0     Injury with Fall? 0 0     Risk for fall due to : No Fall Risks No Fall Risks     Follow up Falls evaluation completed Falls evaluation completed       ADL screen:  See questionnaire below Functional Status Survey: Is the patient deaf or have difficulty hearing?: Yes Does the patient have difficulty seeing, even when wearing glasses/contacts?: No Does the  patient have difficulty concentrating, remembering, or making decisions?: No Does the patient have difficulty walking or climbing stairs?: No Does the patient have difficulty dressing or bathing?: No Does the patient have difficulty doing errands alone such as visiting a doctor's office or shopping?: No   Review of Systems Constitutional: -, -unexpected weight change, -anorexia, -fatigue Dermatology: denies changing moles, rash, lumps ENT: -runny nose, -ear pain, -sore throat,  Cardiology:  -chest pain, -palpitations, -orthopnea, Respiratory: -cough, -shortness of breath, -dyspnea on exertion, -wheezing,  Gastroenterology: -abdominal pain, -nausea, -vomiting, -diarrhea,  -constipation, -dysphagia Hematology: -bleeding or bruising problems Musculoskeletal: -arthralgias, -myalgias, -joint swelling, -back pain, - Ophthalmology: -vision changes,  Urology: -dysuria, -difficulty urinating,  -urinary frequency, -urgency, incontinence Neurology: -, -numbness, , -memory loss, -falls, -dizziness    PHYSICAL EXAM:  BP 114/72   Pulse 73   Temp (!) 97.3 F (36.3 C)   Ht '5\' 3"'$  (1.6 m)   Wt 101 lb 6.4 oz (46 kg)   LMP  (LMP Unknown)   SpO2 98%   BMI 17.96 kg/m   General Appearance: Alert, cooperative, no distress, appears stated age Head: Normocephalic, without obvious abnormality, atraumatic Eyes: PERRL, conjunctiva/corneas clear, EOM's intact,  Ears: Normal TM's and external ear canals Nose: Nares normal, mucosa normal, no drainage or sinus tenderness Throat: Lips, mucosa, and tongue normal; teeth and gums normal Neck: Supple, no lymphadenopathy;  thyroid:  no enlargement/tenderness/nodules; no carotid bruit or JVD Lungs: Clear to auscultation bilaterally without wheezes, rales or ronchi; respirations unlabored Heart: Regular rate and rhythm, S1 and S2 normal, no murmur, rubor gallop Abdomen: Soft, non-tender, nondistended, normoactive bowel sounds,  no masses, no hepatosplenomegaly Extremities: No clubbing, cyanosis or edema.  Diabetic foot exam is normal. Pulses: 2+ and symmetric all extremities Skin:  Skin color, texture, turgor normal, no rashes or lesions Lymph nodes: Cervical, supraclavicular, and axillary nodes normal Neurologic:  CNII-XII intact, normal strength, sensation and gait; reflexes 2+ and symmetric throughout Psych: Normal mood, affect, hygiene and grooming. Hemoglobin A1c is 11.7 ASSESSMENT/PLAN: Non-seasonal allergic rhinitis due to other allergic trigger  Exocrine pancreatic insufficiency  Type 2 diabetes with complication (HCC) - Plan: insulin glargine, 2 Unit Dial, (TOUJEO MAX SOLOSTAR) 300 UNIT/ML Solostar Pen, POCT  glycosylated hemoglobin (Hb A1C)  Seronegative rheumatoid arthritis (HCC)  Hypertensive retinopathy, unspecified laterality  Hypertension associated with diabetes ([place])  Hyperlipidemia associated with type 2 diabetes mellitus ([place]) - Plan: Lipid panel, POCT glycosylated hemoglobin (Hb A1C)  Osteopenia of multiple sites - Plan: DG Bone Density  Aortic atherosclerosis (HCC)  Diabetic nephropathy associated with type 2 diabetes mellitus ([place])  Stage 3b chronic kidney disease ([place]) Recommended she go to the drugstore to get shingles and Tdap. Then discussed the fact that her A1c is elevated.  When she was initially diagnosed with diabetes she was placed on insulin for short period of time and did well on it then.  I will start her on Toujeo.  Continuous glucose monitoring will be done.  This was discussed with her and Maudie Mercury, also discussed this with her and placed a monitor on her.  Discussed the need for her to monitor her blood sugars and we will work on getting her blood sugar down to 120 in the morning.  She is to return here within a week or 2 so we can go over this again.  She expressed understanding of this.   Immunization recommendations discussed.  Colonoscopy recommendations reviewed   Medicare Attestation I have personally reviewed: The patient's medical and social history  Their use of alcohol, tobacco or illicit drugs Their current medications and supplements The patient's functional ability including ADLs,fall risks, home safety risks, cognitive, and hearing and visual impairment Diet and physical activities Evidence for depression or mood disorders  The patient's weight, height, and BMI have been recorded in the chart.  I have made referrals, counseling, and provided education to the patient based on review of the above and I have provided the patient with a written personalized care plan for preventive services.     Jill Alexanders, MD   03/09/2022

## 2022-03-09 NOTE — Patient Instructions (Addendum)

## 2022-03-10 ENCOUNTER — Other Ambulatory Visit: Payer: Self-pay | Admitting: Family Medicine

## 2022-03-10 DIAGNOSIS — K8681 Exocrine pancreatic insufficiency: Secondary | ICD-10-CM

## 2022-03-10 LAB — LIPID PANEL
Chol/HDL Ratio: 2.2 ratio (ref 0.0–4.4)
Cholesterol, Total: 129 mg/dL (ref 100–199)
HDL: 60 mg/dL (ref >39)
LDL Chol Calc (NIH): 48 mg/dL (ref 0–99)
Triglycerides: 116 mg/dL (ref 0–149)
VLDL Cholesterol Cal: 21 mg/dL (ref 5–40)

## 2022-03-12 ENCOUNTER — Other Ambulatory Visit: Payer: Self-pay | Admitting: Family Medicine

## 2022-03-12 DIAGNOSIS — E119 Type 2 diabetes mellitus without complications: Secondary | ICD-10-CM

## 2022-03-15 ENCOUNTER — Ambulatory Visit (INDEPENDENT_AMBULATORY_CARE_PROVIDER_SITE_OTHER): Payer: Medicare HMO | Admitting: Family Medicine

## 2022-03-15 VITALS — BP 120/70 | HR 73 | Temp 98.2°F | Wt 103.8 lb

## 2022-03-15 DIAGNOSIS — Z794 Long term (current) use of insulin: Secondary | ICD-10-CM | POA: Diagnosis not present

## 2022-03-15 DIAGNOSIS — E118 Type 2 diabetes mellitus with unspecified complications: Secondary | ICD-10-CM | POA: Diagnosis not present

## 2022-03-15 NOTE — Patient Instructions (Signed)
Take 8 units of Toujeo daily.  Look at your blood sugar 2 hours after your meals.  If that number is above 200, I want you to look at what you have eaten and see where your calories are coming from ;we will check you next week and go over all that

## 2022-03-15 NOTE — Progress Notes (Signed)
   Subjective:    Patient ID: Deborah Travis, female    DOB: [birth year], 81 y.o.   MRN: [id number]  HPI She is here for recheck on recently starting on Toujeo.  She is supposed to start on 10 units slowly increasing medication however she was taking it twice per day and obviously got the dosing regimen wrong.  Review of the record indicates that she has had several readings below threshold early in the morning.   Review of Systems     Objective:   Physical Exam Alert and in no distress.  Her records were reviewed.  She would tend to bottom out early in the morning with some of the below 50.       Assessment & Plan:  Type 2 diabetes with complication ([place]) And I had a long discussion with her.  We will drop her down to 8 units of Toujeo.  Recommend that she check her blood sugar 2 hours after any meal and see how that does and also pay attention to any time that she hears an alarm and goes below 70.  Discussed possibility of raising the Toujeo based on her blood sugar especially if she stays above 250 mark for too long.  She will keep [place] informed otherwise I will see her in about 6 days.

## 2022-03-20 ENCOUNTER — Encounter: Payer: Self-pay | Admitting: Family Medicine

## 2022-03-21 ENCOUNTER — Other Ambulatory Visit: Payer: Medicare HMO

## 2022-03-23 ENCOUNTER — Other Ambulatory Visit: Payer: Medicare HMO

## 2022-03-24 ENCOUNTER — Encounter: Payer: Self-pay | Admitting: Family Medicine

## 2022-03-26 ENCOUNTER — Other Ambulatory Visit: Payer: Self-pay | Admitting: Medical

## 2022-03-26 NOTE — Telephone Encounter (Signed)
Pt called and left voicemail that she needs 1,'000mg'$  time release  that she takes twice a day sent in to pharmacy. Looks like shes been taking '500mg'$  2 tablets in am and 2 tablets in pm. Is it ok to send in 1,'000mg'$ . She needs 30 days to go to local pharmacy and then [facility]-mail order

## 2022-03-27 ENCOUNTER — Other Ambulatory Visit: Payer: Self-pay | Admitting: Medical

## 2022-03-27 MED ORDER — METFORMIN HCL ER 500 MG PO TB24: 1000.0000 mg | ORAL_TABLET | Freq: Every day | ORAL | 2 refills | Status: DC

## 2022-03-29 ENCOUNTER — Other Ambulatory Visit: Payer: Self-pay | Admitting: Physician Assistant

## 2022-03-29 ENCOUNTER — Other Ambulatory Visit: Payer: Self-pay | Admitting: Family Medicine

## 2022-03-29 DIAGNOSIS — K8681 Exocrine pancreatic insufficiency: Secondary | ICD-10-CM

## 2022-03-29 DIAGNOSIS — M0600 Rheumatoid arthritis without rheumatoid factor, unspecified site: Secondary | ICD-10-CM

## 2022-03-29 NOTE — Telephone Encounter (Signed)
Next Visit: 07/03/2022   Last Visit: 01/30/2022  Last Fill: 12/13/2021  [id number] rheumatoid arthritis   Current Dose per office note 01/30/2022: Arava 20 mg 1 tablet by mouth daily   Labs: 01/30/2022, CBC is normal, CMP shows elevated glucose at 368.  Creatinine is elevated and stable.    Okay to refill Arava?

## 2022-04-06 ENCOUNTER — Ambulatory Visit (INDEPENDENT_AMBULATORY_CARE_PROVIDER_SITE_OTHER): Payer: Medicare HMO | Admitting: Family Medicine

## 2022-04-06 ENCOUNTER — Encounter: Payer: Self-pay | Admitting: Family Medicine

## 2022-04-06 VITALS — BP 130/84 | HR 48 | Temp 97.3°F | Wt 102.8 lb

## 2022-04-06 DIAGNOSIS — E118 Type 2 diabetes mellitus with unspecified complications: Secondary | ICD-10-CM | POA: Diagnosis not present

## 2022-04-06 MED ORDER — METFORMIN HCL ER 500 MG PO TB24: 1000.0000 mg | ORAL_TABLET | Freq: Every day | ORAL | 2 refills | Status: DC

## 2022-04-06 MED ORDER — BD PEN NEEDLE NANO 2ND GEN 32G X 4 MM MISC: 1.0000 | Freq: Every day | 1 refills | Status: DC

## 2022-04-06 NOTE — Progress Notes (Signed)
   Subjective:    Patient ID: Deborah Travis, female    DOB: July 24, 1941, 81 y.o.   MRN: [id number]  HPI She is using the freestyle [place] system.  She did bring him the information.   Review of Systems     Objective:   Physical Exam Alert and in no distress otherwise not examined.  The blood sugar readings were reviewed.  It showed that she sometimes does bottom out during the evening hours however she does not wake up when the alarm goes off.  Also her lunchtime blood sugars tend to go above the normal range.       Assessment & Plan:  Type 2 diabetes with complication ([place]) - Plan: Amb Referral to Nutrition and Diabetic Education I discussed more frequent but smaller meals with her, looking at her carbohydrates especially over the lunch hour and possibly having a evening snack to keep from 5 me out although she apparently does not do this very often.  I think is appropriate to have her see nutrition and diabetes and work with her to have more frequent but smaller meals to flatten her curve out.  She was comfortable with that. Recheck 1 month

## 2022-04-10 ENCOUNTER — Other Ambulatory Visit: Payer: Medicare HMO

## 2022-04-10 MED ORDER — FREESTYLE LIBRE 3 SENSOR MISC: 0 refills | Status: DC

## 2022-04-11 ENCOUNTER — Other Ambulatory Visit: Payer: Medicare HMO

## 2022-04-15 DIAGNOSIS — Z794 Long term (current) use of insulin: Secondary | ICD-10-CM | POA: Diagnosis not present

## 2022-04-15 DIAGNOSIS — E118 Type 2 diabetes mellitus with unspecified complications: Secondary | ICD-10-CM | POA: Diagnosis not present

## 2022-04-16 ENCOUNTER — Encounter: Payer: Self-pay | Admitting: Family Medicine

## 2022-04-18 ENCOUNTER — Other Ambulatory Visit: Payer: Self-pay | Admitting: Family Medicine

## 2022-04-18 ENCOUNTER — Other Ambulatory Visit (INDEPENDENT_AMBULATORY_CARE_PROVIDER_SITE_OTHER): Payer: Medicare HMO

## 2022-04-18 DIAGNOSIS — E118 Type 2 diabetes mellitus with unspecified complications: Secondary | ICD-10-CM

## 2022-04-18 DIAGNOSIS — E1169 Type 2 diabetes mellitus with other specified complication: Secondary | ICD-10-CM

## 2022-04-18 DIAGNOSIS — E1159 Type 2 diabetes mellitus with other circulatory complications: Secondary | ICD-10-CM

## 2022-04-18 NOTE — Progress Notes (Signed)
Pt meter was applied to right arm and will return for follow up with Dr [place] School in Sept.

## 2022-05-02 ENCOUNTER — Encounter: Payer: Self-pay | Admitting: Internal Medicine

## 2022-05-02 ENCOUNTER — Telehealth: Payer: Self-pay | Admitting: Family Medicine

## 2022-05-02 NOTE — Telephone Encounter (Signed)
Patient left voicemail that she received letter from [facility] that they denied payment for her glucose sensor

## 2022-05-03 NOTE — Telephone Encounter (Signed)
Sent pt  a my cart message. Deborah Travis

## 2022-05-04 ENCOUNTER — Ambulatory Visit (INDEPENDENT_AMBULATORY_CARE_PROVIDER_SITE_OTHER): Payer: Medicare HMO | Admitting: Family Medicine

## 2022-05-04 ENCOUNTER — Encounter: Payer: Self-pay | Admitting: Family Medicine

## 2022-05-04 VITALS — BP 110/68 | HR 70 | Temp 97.9°F | Wt 101.8 lb

## 2022-05-04 DIAGNOSIS — Z23 Encounter for immunization: Secondary | ICD-10-CM | POA: Diagnosis not present

## 2022-05-04 DIAGNOSIS — E118 Type 2 diabetes mellitus with unspecified complications: Secondary | ICD-10-CM

## 2022-05-04 NOTE — Progress Notes (Signed)
   Subjective:    Patient ID: Deborah Travis, female    DOB: [birth year], 81 y.o.   MRN: [id number]  HPI She is here for medication recheck as well as with her CGM.  The information was downloaded and reviewed with her.   Review of Systems     Objective:   Physical Exam  And in no distress otherwise not examined      Assessment & Plan:  Type 2 diabetes with complication ([place])  Need for influenza vaccination - Plan: Flu Vaccine QUAD High Dose(Fluad) The blood work was discussed with her.  It looks as if she is having difficulty nighttime elevated blood sugars.  I discussed this in detail with her recommending either more frequent but smaller meals or perhaps further changes especially with her carbohydrates.  She is scheduled to talk to a dietitian about this in the near future.  We will then see her after she has had a talk to the the dietitian and hopefully see changes in her numbers.

## 2022-05-11 ENCOUNTER — Emergency Department (HOSPITAL_BASED_OUTPATIENT_CLINIC_OR_DEPARTMENT_OTHER): Payer: Medicare HMO

## 2022-05-11 ENCOUNTER — Encounter (HOSPITAL_BASED_OUTPATIENT_CLINIC_OR_DEPARTMENT_OTHER): Payer: Self-pay | Admitting: Emergency Medicine

## 2022-05-11 ENCOUNTER — Other Ambulatory Visit: Payer: Self-pay

## 2022-05-11 ENCOUNTER — Emergency Department (HOSPITAL_BASED_OUTPATIENT_CLINIC_OR_DEPARTMENT_OTHER)
Admission: EM | Admit: 2022-05-11 | Discharge: 2022-05-11 | Disposition: A | Discharge status: Home / Self Care | Payer: Medicare HMO | Attending: Emergency Medicine | Admitting: Emergency Medicine

## 2022-05-11 ENCOUNTER — Telehealth: Payer: Self-pay

## 2022-05-11 DIAGNOSIS — Z6379 Other stressful life events affecting family and household: Secondary | ICD-10-CM | POA: Diagnosis not present

## 2022-05-11 DIAGNOSIS — Z8659 Personal history of other mental and behavioral disorders: Secondary | ICD-10-CM

## 2022-05-11 DIAGNOSIS — E1122 Type 2 diabetes mellitus with diabetic chronic kidney disease: Secondary | ICD-10-CM | POA: Insufficient documentation

## 2022-05-11 DIAGNOSIS — F419 Anxiety disorder, unspecified: Secondary | ICD-10-CM | POA: Diagnosis not present

## 2022-05-11 DIAGNOSIS — M25511 Pain in right shoulder: Secondary | ICD-10-CM

## 2022-05-11 DIAGNOSIS — Z794 Long term (current) use of insulin: Secondary | ICD-10-CM | POA: Insufficient documentation

## 2022-05-11 DIAGNOSIS — R0789 Other chest pain: Secondary | ICD-10-CM

## 2022-05-11 DIAGNOSIS — Z79899 Other long term (current) drug therapy: Secondary | ICD-10-CM | POA: Insufficient documentation

## 2022-05-11 DIAGNOSIS — R079 Chest pain, unspecified: Secondary | ICD-10-CM | POA: Diagnosis not present

## 2022-05-11 DIAGNOSIS — Z7984 Long term (current) use of oral hypoglycemic drugs: Secondary | ICD-10-CM | POA: Insufficient documentation

## 2022-05-11 DIAGNOSIS — I129 Hypertensive chronic kidney disease with stage 1 through stage 4 chronic kidney disease, or unspecified chronic kidney disease: Secondary | ICD-10-CM | POA: Insufficient documentation

## 2022-05-11 DIAGNOSIS — R072 Precordial pain: Secondary | ICD-10-CM | POA: Insufficient documentation

## 2022-05-11 DIAGNOSIS — N189 Chronic kidney disease, unspecified: Secondary | ICD-10-CM | POA: Diagnosis not present

## 2022-05-11 LAB — CBC
HCT: 36.8 % (ref 36.0–46.0)
Hemoglobin: 12.2 g/dL (ref 12.0–15.0)
MCH: 30 pg (ref 26.0–34.0)
MCHC: 33.2 g/dL (ref 30.0–36.0)
MCV: 90.6 fL (ref 80.0–100.0)
Platelets: 184 10*3/uL (ref 150–400)
RBC: 4.06 MIL/uL (ref 3.87–5.11)
RDW: 14.4 % (ref 11.5–15.5)
WBC: 9.4 10*3/uL (ref 4.0–10.5)
nRBC: 0 % (ref 0.0–0.2)

## 2022-05-11 LAB — BASIC METABOLIC PANEL
Anion gap: 9 (ref 5–15)
BUN: 25 mg/dL — ABNORMAL HIGH (ref 8–23)
CO2: 24 mmol/L (ref 22–32)
Calcium: 9.5 mg/dL (ref 8.9–10.3)
Chloride: 103 mmol/L (ref 98–111)
Creatinine, Ser: 1.08 mg/dL — ABNORMAL HIGH (ref 0.44–1.00)
GFR, Estimated: 53 mL/min — ABNORMAL LOW (ref >60)
Glucose, Bld: 184 mg/dL — ABNORMAL HIGH (ref 70–99)
Potassium: 4.1 mmol/L (ref 3.5–5.1)
Sodium: 136 mmol/L (ref 135–145)

## 2022-05-11 LAB — TROPONIN I (HIGH SENSITIVITY): Troponin I (High Sensitivity): 5 ng/L (ref <18)

## 2022-05-11 NOTE — Telephone Encounter (Signed)
Pt. Called stating last night around 1 am she had to use the bathroom and she lets her dog out to go outside then too. She stated her dog got scared and ran off into the woods. She stated she could not sleep after that. Then her rt. Shoulder started hurting, she thought it was her arthritis. She then stats she had some lt. Side chest and neck pain, she wasn't sure if it was from the anxiety from her dog running away or something wrong with her heart. She has never had any symptoms like this before. Per Maudie Mercury since this only happened once and she is feeling fine now, she should just keep an eye on it over the weekend. If she starts having any symptoms again she can go to the ER or Urgent Care. Pt. Was instructed to call back Monday to schedule an apt. Here if needed.

## 2022-05-11 NOTE — ED Provider Notes (Signed)
[facility] DEPART[facility] Provider Note   CSN: [id number] Arrival date & time: 05/11/22  1211     History  Chief Complaint  Patient presents with   Chest Pain    Deborah Travis is a 81 y.o. female.  The history is provided by the patient and medical records. No language interpreter was used.  Chest Pain Pain location:  Substernal area Pain quality: aching and tightness   Pain radiates to:  L jaw and R shoulder Pain severity:  Moderate Onset quality:  Gradual Duration:  3 hours Timing:  Constant Progression:  Resolved Chronicity:  New Context: stress   Relieved by:  Nothing Worsened by:  Nothing Ineffective treatments:  None tried Associated symptoms: anxiety   Associated symptoms: no abdominal pain, no altered mental status, no back pain, no cough, no diaphoresis, no dizziness, no fatigue, no fever, no headache, no heartburn, no nausea, no near-syncope, no numbness, no palpitations, no shortness of breath, no vomiting and no weakness        Home Medications Prior to Admission medications   Medication Sig Start Date End Date Taking? Authorizing Provider  ACCU-CHEK GUIDE test strip TEST BLOOD SUGAR ONE TIME DAILY AS DIRECTED Patient not taking: Reported on 04/06/2022 06/20/21   [name], MD  Accu-Chek Softclix Lancets lancets TEST BLOOD SUGAR TWICE DAILY Patient not taking: Reported on 04/06/2022 06/20/21   [name], MD  Accu-Chek Softclix Lancets lancets TEST BLOOD SUGAR TWICE DAILY Patient not taking: Reported on 04/06/2022 06/20/21   [name], MD  Alcohol Swabs (B-D SINGLE USE SWABS BUTTERFLY) PADS 1 each by Does not apply route 2 (two) times daily. Patient not taking: Reported on 04/06/2022 04/15/20   [name], MD  b complex vitamins capsule Take 1 capsule by mouth daily.    [provider]  Blood Glucose Calibration (ACCU-CHEK AVIVA) SOLN Use as directed Patient not taking: Reported on 04/06/2022 09/21/14   [name], MD  Blood Glucose Monitoring Suppl (ACCU-CHEK AVIVA PLUS) w/Device KIT Use as directed Patient is to test BID DX E11.9 Patient not taking: Reported on 04/06/2022 02/24/18   [name], MD  Calcium-Magnesium-Zinc [id number] MG TABS Take by mouth.    [provider]  cholecalciferol (VITAMIN D3) 25 MCG (1000 UT) tablet Take 1,000 Units by mouth 2 (two) times a day.    [provider]  Coenzyme Q10 (CO Q 10) 10 MG CAPS Take 10 mg by mouth daily.     [provider]  Continuous Blood Gluc Sensor (FREESTYLE LIBRE 3 SENSOR) MISC Place 1 sensor on the skin every 14 days. Use to check glucose continuously 04/10/22   Tysinger, Camelia Eng, PA-C  Dextromethorphan HBr (DELSYM PO) Take by mouth at bedtime as needed. Patient not taking: Reported on 03/09/2022    [provider]  diphenhydrAMINE (BENADRYL) 25 mg capsule Take by mouth as needed.  Patient not taking: Reported on 04/06/2022    [provider]  hydroxychloroquine (PLAQUENIL) 200 MG tablet TAKE 1 TABLET EVERY DAY MONDAY THROUGH FRIDAY ONLY AND TAKE 1/2 TABLET ON SATURDAYS AND SUNDAYS 02/26/22   Ofilia Neas, PA-C  insulin glargine, 2 Unit Dial, (TOUJEO MAX SOLOSTAR) 300 UNIT/ML Solostar Pen Inject 10 Units into the skin daily. 03/09/22   [name], MD  Insulin Pen Needle (BD PEN NEEDLE NANO 2ND GEN) 32G X 4 MM MISC 1 each by Does not apply route daily. 04/06/22   [name],  MD  leflunomide (ARAVA) 20 MG tablet TAKE 1 TABLET EVERY DAY 03/29/22   [name], MD  levothyroxine (SYNTHROID) 125 MCG tablet TAKE 1 TABLET EVERY DAY 01/15/22   [name], MD  lisinopril (ZESTRIL) 10 MG tablet TAKE 1 TABLET EVERY DAY 04/18/22   [name], MD  Menthol (RICOLA) LOZG Use as directed 1 each in the mouth or throat as needed. Patient not taking: Reported on 05/04/2022    [provider]  metFORMIN (GLUCOPHAGE-XR) 500 MG 24 hr tablet Take 2 tablets (1,000 mg total) by mouth daily with  breakfast. 04/06/22   [name], MD  Multiple Vitamin (MULITIVITAMIN WITH MINERALS) TABS Take 1 tablet by mouth daily.    [provider]  omeprazole (PRILOSEC) 40 MG capsule TAKE 1 CAPSULE (40 MG TOTAL) BY MOUTH DAILY. 03/29/22   [name], MD  pravastatin (PRAVACHOL) 20 MG tablet TAKE 1 TABLET EVERY DAY 04/18/22   [name], MD  VITAMIN E COMPLEX PO Take 1 capsule by mouth daily.     [provider]  ZENPEP 25000-79000 units CPEP TAKE 3 CAPSULES WITH BREAKFAST, LUNCH AND DINNER AND TAKE 1 CAPSULE WITH EACH SNACK (3 A DAY) 01/20/22   [name], MD  Zinc Sulfate (ZINC 15 PO) Take 15 mg by mouth daily.    [provider]      Allergies    Levofloxacin and Codeine    Review of Systems   Review of Systems  Constitutional:  Negative for chills, diaphoresis, fatigue and fever.  HENT:  Negative for congestion.   Eyes:  Negative for visual disturbance.  Respiratory:  Positive for chest tightness. Negative for cough, shortness of breath and wheezing.   Cardiovascular:  Positive for chest pain. Negative for palpitations and near-syncope.  Gastrointestinal:  Negative for abdominal pain, constipation, diarrhea, heartburn, nausea and vomiting.  Genitourinary:  Negative for dysuria and flank pain.  Musculoskeletal:  Negative for back pain, neck pain and neck stiffness.  Skin:  Positive for rash and wound.  Neurological:  Negative for dizziness, weakness, light-headedness, numbness and headaches.  Psychiatric/Behavioral:  The patient is nervous/anxious (during episode).   All other systems reviewed and are negative.   Physical Exam Updated Vital Signs BP 122/72   Pulse 83   Temp 98.3 F (36.8 C) (Oral)   Resp 18   Ht 5' 3"  (1.6 m)   Wt 44.9 kg   LMP  (LMP Unknown)   SpO2 98%   BMI 17.54 kg/m  Physical Exam Vitals and nursing note reviewed.  Constitutional:      General: She is not in acute distress.    Appearance: She is well-developed. She  is not ill-appearing, toxic-appearing or diaphoretic.  HENT:     Head: Normocephalic and atraumatic.  Eyes:     Conjunctiva/sclera: Conjunctivae normal.  Cardiovascular:     Rate and Rhythm: Normal rate and regular rhythm.     Heart sounds: Normal heart sounds. No murmur heard. Pulmonary:     Effort: Pulmonary effort is normal. No respiratory distress.     Breath sounds: Normal breath sounds. No decreased breath sounds, wheezing, rhonchi or rales.  Chest:     Chest wall: Tenderness present.  Abdominal:     Palpations: Abdomen is soft.     Tenderness: There is no abdominal tenderness.  Musculoskeletal:        General: No swelling.     Cervical back: Neck supple.     Right lower  leg: No tenderness. No edema.     Left lower leg: No tenderness. No edema.  Skin:    General: Skin is warm and dry.     Capillary Refill: Capillary refill takes less than 2 seconds.  Neurological:     General: No focal deficit present.     Mental Status: She is alert.  Psychiatric:        Mood and Affect: Mood normal.     ED Results / Procedures / Treatments   Labs (all labs ordered are listed, but only abnormal results are displayed) Labs Reviewed  BASIC METABOLIC PANEL - Abnormal; Notable for the following components:      Result Value   Glucose, Bld 184 (*)    BUN 25 (*)    Creatinine, Ser 1.08 (*)    GFR, Estimated 53 (*)    All other components within normal limits  CBC  TROPONIN I (HIGH SENSITIVITY)    EKG EKG Interpretation  Date/Time:  Friday May 11 2022 12:25:43 EDT Ventricular Rate:  91 PR Interval:  132 QRS Duration: 70 QT Interval:  388 QTC Calculation: 477 R Axis:   -54 Text Interpretation: Normal sinus rhythm Low voltage QRS Left anterior fascicular block Abnormal ECG When compared with ECG of 09-Mar-2020 10:53, PREVIOUS ECG IS PRESENT when compared to prior, more wandering baseline than prior No STEMI Confirmed by Antony Blackbird [phone]) on 05/11/2022 12:35:53  PM  Radiology DG Chest 2 View  Result Date: 05/11/2022 CLINICAL DATA:  Chest pain EXAM: CHEST - 2 VIEW COMPARISON:  Chest 03/09/2020 FINDINGS: The heart size and mediastinal contours are within normal limits. Both lungs are clear. The visualized skeletal structures are unremarkable. IMPRESSION: No active cardiopulmonary disease. Electronically Signed   By: Franchot Gallo M.D.   On: 05/11/2022 12:43    Procedures Procedures    Medications Ordered in ED Medications - No data to display  ED Course/ Medical Decision Making/ A&P                           Medical Decision Making Amount and/or Complexity of Data Reviewed Labs: ordered. Radiology: ordered.    JACKLINE CASTILLA is a 81 y.o. female with a past medical history significant for hypertension, diabetes, hyperlipidemia, CKD, GERD, and previous cholecystectomy and Whipple procedure who presents with chest tightness and discomfort overnight.  According to patient, she normally gets up about 1 AM and goes to the restroom and then let her dog out.  She reports that after she went to the bathroom, her dog that normally comes back inside was nowhere to be found.  Due to the dog's small size and being in the woods patient was very concerned about predators.  Patient then started having worsening chest tightness in her central chest that went towards her left jaw and her right shoulder.  She did report some right shoulder soreness before losing the dog but says she does have arthritis in her shoulder at times.  She reports that the symptoms lasted for several hours before easing and then she has been free of symptoms since about 6:30 AM.  At 8:30 in the morning, the dog did return without injuries.  Patient says that although she has no history of cardiac disease, she was concerned about her chest and heart.  She denies any leg pain or leg swelling and denies any shortness of breath with it.  Denies any nausea, vomiting, palpitations, or  lightheadedness during the  episodes.  She just had the chest tightness and pressure in her central chest that she was concerned about.  She reports it was moderate at its worst but completely resolved.  She denies rashes to suggest shingles, fevers, chills, or cough.  No URI symptoms reported.  No other complaints reported.  No leg pain or leg swelling and no history of DVT or PE reported.  On exam, she does have some tenderness in her central chest that does reproduce some of the discomfort she had earlier.  Lungs were clear and there was no murmur appreciated.  Good pulses in all extremities with no significant edema in the legs.  EKG does not show STEMI and vital signs are completely reassuring on my initial evaluation.  Clinically I suspect patient did have an episode of panic attack or anxiety that provoked her symptoms.  We will have her get x-ray and labs including troponins to make sure that this was not a stress-induced stress test causing cardiac pain.  Given her lack of leg pain or leg swelling and her description of symptoms being nonpleuritic or exertional, have low suspicion for a thromboembolic etiology and patient agrees with holding on work-up for that.  We will get labs and monitor her on the telemetry.  If work-up is reassuring, suspect this is more musculoskeletal versus anxiety related and we feel she will likely be safe for discharge home.  Anticipate reassessment after labs and delta troponin.           2:11 PM Patient's initial work-up is reassuring.  Initial troponin is negative.  I went spoke to the patient and she does not want to wait for second troponin because her symptoms were from over 12 hours ago and symptoms have been absent for over 6 hours.  Other work-up was reassuring and we went over all together.  I do suspect the patient's symptoms were a panic attack/anxiety causing her physical symptoms.  We discussed that given her age it is reasonable to have her follow-up  with her PCP and cardiologist and if any symptoms were to change or worsen, she would likely return for further work-up.  She does not want further work-up at this time and feels safe going home.   She had no other questions or concerns and was discharged in good condition with understanding return precautions and follow-up instructions.          Final Clinical Impression(s) / ED Diagnoses Final diagnoses:  Atypical chest pain  History of recent stressful life event  Acute pain of right shoulder    Rx / DC Orders ED Discharge Orders     None       Clinical Impression: 1. Atypical chest pain   2. History of recent stressful life event   3. Acute pain of right shoulder     Disposition: Discharge  Condition: Good  I have discussed the results, Dx and Tx plan with the pt(& family if present). He/she/they expressed understanding and agree(s) with the plan. Discharge instructions discussed at great length. Strict return precautions discussed and pt &/or family have verbalized understanding of the instructions. No further questions at time of discharge.    New Prescriptions   No medications on file    Follow Up: [name], [place] [id number] [phone]     [facility] [place] [facility] [address] [id number] mc [address] [place] [phone]        Rochele Lueck, Gwenyth Allegra,  MD 05/11/22 1415

## 2022-05-11 NOTE — ED Notes (Signed)
ED Provider at bedside. 

## 2022-05-11 NOTE — Discharge Instructions (Signed)
Your history, exam, work-up today were overall reassuring.  I suspect that your symptoms were related to a stressful anxiety and panic attack from losing Angie in the woods.  Your lab testing was reassuring.  We did discuss getting a second troponin however given your stability for over 6 hours without any further symptoms we do feel is reasonable to allow you to be discharged with plans to follow-up with your PCP with good return precautions.  If any symptoms are to change or worsen acutely, please return to the nearest emergency department.

## 2022-05-11 NOTE — ED Triage Notes (Signed)
Central chest pressure last night after midnight , right shoulder and left neck pain . Denies shortness of breath . Reports it may be a panic attack because her symptoms started when she could not find her dog .

## 2022-05-14 ENCOUNTER — Telehealth: Payer: Self-pay

## 2022-05-14 NOTE — Telephone Encounter (Signed)
Transition Care Management Unsuccessful Follow-up Telephone Call  Date of discharge and from where:  [facility] 05/11/22  Attempts:  1st Attempt  Reason for unsuccessful TCM follow-up call:  Left voice message

## 2022-05-15 ENCOUNTER — Telehealth: Payer: Self-pay

## 2022-05-15 NOTE — Telephone Encounter (Signed)
Transition Care Management Unsuccessful Follow-up Telephone Call  Date of discharge and from where:  [facility] 05/11/22  Attempts:  2nd Attempt  Reason for unsuccessful TCM follow-up call:  Left voice message

## 2022-05-16 ENCOUNTER — Telehealth: Payer: Self-pay

## 2022-05-16 ENCOUNTER — Encounter: Payer: Self-pay | Admitting: Family Medicine

## 2022-05-16 DIAGNOSIS — Z794 Long term (current) use of insulin: Secondary | ICD-10-CM | POA: Diagnosis not present

## 2022-05-16 DIAGNOSIS — E118 Type 2 diabetes mellitus with unspecified complications: Secondary | ICD-10-CM | POA: Diagnosis not present

## 2022-05-16 NOTE — Telephone Encounter (Signed)
Transition Care Management Unsuccessful Follow-up Telephone Call  Date of discharge and from where:  [facility]  Attempts:  3rd Attempt  Reason for unsuccessful TCM follow-up call:  Left voice message pt. Will be mailed letter

## 2022-05-22 ENCOUNTER — Telehealth: Payer: Self-pay

## 2022-05-22 NOTE — Telephone Encounter (Signed)
Pt. Calling back since she received her letter in the mail from me about completing a TOC call. She stated she is doing fine now and that she was just having a panic attack. She did not want to schedule a f/u since she is doing better now.

## 2022-05-23 ENCOUNTER — Telehealth: Payer: Self-pay

## 2022-05-23 NOTE — Patient Outreach (Signed)
  Care Coordination   05/23/2022 Name: Deborah Travis MRN: [id number] DOB: 09/12/1940   Care Coordination Outreach Attempts:  An unsuccessful telephone outreach was attempted today to offer the patient information about available care coordination services as a benefit of their health plan.   Follow Up Plan:  Additional outreach attempts will be made to offer the patient care coordination information and services.   Encounter Outcome:  No Answer  Care Coordination Interventions Activated:  No   Care Coordination Interventions:  No, not indicated    Enzo Montgomery, RN,BSN,CCM [facility] Telephonic Care Management Coordinator Direct Phone: [phone] Toll Free: [phone] Fax: [phone]

## 2022-05-25 ENCOUNTER — Encounter: Payer: Self-pay | Admitting: Family Medicine

## 2022-05-29 ENCOUNTER — Encounter: Payer: Self-pay | Admitting: Family Medicine

## 2022-05-29 ENCOUNTER — Ambulatory Visit (INDEPENDENT_AMBULATORY_CARE_PROVIDER_SITE_OTHER): Payer: Medicare HMO | Admitting: Family Medicine

## 2022-05-29 VITALS — BP 120/60 | HR 71 | Temp 98.4°F | Wt 102.4 lb

## 2022-05-29 DIAGNOSIS — E118 Type 2 diabetes mellitus with unspecified complications: Secondary | ICD-10-CM | POA: Diagnosis not present

## 2022-05-29 NOTE — Progress Notes (Signed)
   Subjective:    Patient ID: Deborah Travis, female    DOB: 07/19/1941, 81 y.o.   MRN: [id number]  HPI She is here for recheck on her freestyle libre.  She did bring in the information.  She is taking 8 units of Toujeo.   Review of Systems     Objective:   Physical Exam Alert and in no distress otherwise not examined       Assessment & Plan:  Type 2 diabetes with complication ([place]) I reviewed information with her.  Explained to her that she is using the correct dose of insulin we do not want to increase that but she needs to have more frequent but smaller meals and also be much more aware of her carbohydrates.  She did seem to understand this.  She will call for problems otherwise see her again in 2 months.

## 2022-05-30 ENCOUNTER — Telehealth: Payer: Self-pay

## 2022-05-30 NOTE — Patient Outreach (Signed)
  Care Coordination   05/30/2022 Name: Deborah Travis MRN: [id number] DOB: [birth year]   Care Coordination Outreach Attempts:  A second unsuccessful outreach was attempted today to offer the patient with information about available care coordination services as a benefit of their health plan.     Follow Up Plan:  Additional outreach attempts will be made to offer the patient care coordination information and services.   Encounter Outcome:  No Answer  Care Coordination Interventions Activated:  No   Care Coordination Interventions:  No, not indicated      Enzo Montgomery, RN,BSN,CCM [facility] Telephonic Care Management Coordinator Direct Phone: [phone] Toll Free: [phone] Fax: [phone]

## 2022-05-31 ENCOUNTER — Telehealth: Payer: Self-pay | Admitting: Pharmacist

## 2022-05-31 NOTE — Chronic Care Management (AMB) (Unsigned)
Chronic Care Management Pharmacy Assistant   Name: Deborah Travis  MRN: [id number] DOB: [birth year]  Reason for Encounter: ED Follow up Assessment per [name]   Recent office visits:  06/20/22 [name], PA-C - Patient presented via video for sore throat and other concerns. Stopped Delsym . Stopped Benadryl.  05/29/22 [name], MD - Patient presented for Type 2 diabetes mellitus with complication. No medication changes.  05/04/22 [name], MD - Patient presented for Type 2 diabetes with complication and other concerns. No medication changes.  04/06/22 [name], MD - Patient presented for Type 2 diabetes with complication and other concerns. No medication changes.  03/15/22 [name], MD - Patient presented for Type 2 diabetes with complication and other concerns. No medication changes.  03/09/22  [name], MD - Patient presented for Non Seasonal allergic rhinitis die to other allergic trigger and other concerns.Prescribed Tujeo 10U  01/29/22 [name], MD - Patient presented for Sore throat and other concerns. No medication changes. Recommended Rhinocort or Flonase.  Recent consult visits:  04/15/22 Claims encounter for Type 2 diabetes mellitus with unspecified complications and other concerns with [facility]. No other visit details available.  01/31/22 Black, Ronie Spies, AUD - Patient presented for Sensory hearing loss bilateral. No medication changes.  01/30/22 [name], MD (Rheumatology) - Patient presented for Seronegative rheumatoid arthritis and other concerns. Stopped Amoxicillin. Stopped Benzonatate.    Hospital visits:  Medication Reconciliation was completed by comparing discharge summary, patient's EMR and Pharmacy list, and upon discussion with patient.  Patient presented to Med [facility] ED on 05/11/22 due to Atypical Chest pain and other concerns. Patient was present for 1 hour.  New?Medications  Started at [facility] Discharge:?? -started  none  Medication Changes at Hospital Discharge: -Changed  none  Medications Discontinued at Hospital Discharge: -Stopped none  Medications that remain the same after Hospital Discharge:??  -All other medications will remain the same.    Medications: Outpatient Encounter Medications as of 05/31/2022  Medication Sig Note   ACCU-CHEK GUIDE test strip TEST BLOOD SUGAR ONE TIME DAILY AS DIRECTED (Patient not taking: Reported on 04/06/2022)    Accu-Chek Softclix Lancets lancets TEST BLOOD SUGAR TWICE DAILY (Patient not taking: Reported on 04/06/2022)    Accu-Chek Softclix Lancets lancets TEST BLOOD SUGAR TWICE DAILY (Patient not taking: Reported on 04/06/2022)    Alcohol Swabs (B-D SINGLE USE SWABS BUTTERFLY) PADS 1 each by Does not apply route 2 (two) times daily. (Patient not taking: Reported on 04/06/2022)    b complex vitamins capsule Take 1 capsule by mouth daily.    Blood Glucose Calibration (ACCU-CHEK AVIVA) SOLN Use as directed (Patient not taking: Reported on 04/06/2022)    Blood Glucose Monitoring Suppl (ACCU-CHEK AVIVA PLUS) w/Device KIT Use as directed Patient is to test BID DX E11.9 (Patient not taking: Reported on 04/06/2022)    Calcium-Magnesium-Zinc 333-133-5 MG TABS Take by mouth.    cholecalciferol (VITAMIN D3) 25 MCG (1000 UT) tablet Take 1,000 Units by mouth 2 (two) times a day.    Coenzyme Q10 (CO Q 10) 10 MG CAPS Take 10 mg by mouth daily.     Continuous Blood Gluc Sensor (FREESTYLE LIBRE 3 SENSOR) MISC Place 1 sensor on the skin every 14 days. Use to check glucose continuously    Dextromethorphan HBr (DELSYM PO) Take by mouth at bedtime as needed. (Patient not taking: Reported on 03/09/2022)    diphenhydrAMINE (BENADRYL)  25 mg capsule Take by mouth as needed.  (Patient not taking: Reported on 04/06/2022) 03/09/2022: Prn last dose three days ago   hydroxychloroquine (PLAQUENIL) 200 MG tablet TAKE 1 TABLET EVERY DAY MONDAY THROUGH FRIDAY  ONLY AND TAKE 1/2 TABLET ON SATURDAYS AND SUNDAYS    insulin glargine, 2 Unit Dial, (TOUJEO MAX SOLOSTAR) 300 UNIT/ML Solostar Pen Inject 10 Units into the skin daily.    Insulin Pen Needle (BD PEN NEEDLE NANO 2ND GEN) 32G X 4 MM MISC 1 each by Does not apply route daily.    leflunomide (ARAVA) 20 MG tablet TAKE 1 TABLET EVERY DAY    levothyroxine (SYNTHROID) 125 MCG tablet TAKE 1 TABLET EVERY DAY    lisinopril (ZESTRIL) 10 MG tablet TAKE 1 TABLET EVERY DAY    Menthol (RICOLA) LOZG Use as directed 1 each in the mouth or throat as needed. (Patient not taking: Reported on 05/04/2022) 12/09/2019: Sugar free   metFORMIN (GLUCOPHAGE-XR) 500 MG 24 hr tablet Take 2 tablets (1,000 mg total) by mouth daily with breakfast.    Multiple Vitamin (MULITIVITAMIN WITH MINERALS) TABS Take 1 tablet by mouth daily.    omeprazole (PRILOSEC) 40 MG capsule TAKE 1 CAPSULE (40 MG TOTAL) BY MOUTH DAILY.    pravastatin (PRAVACHOL) 20 MG tablet TAKE 1 TABLET EVERY DAY    VITAMIN E COMPLEX PO Take 1 capsule by mouth daily.     ZENPEP 25000-79000 units CPEP TAKE 3 CAPSULES WITH BREAKFAST, LUNCH AND DINNER AND TAKE 1 CAPSULE WITH EACH SNACK (3 A DAY)    Zinc Sulfate (ZINC 15 PO) Take 15 mg by mouth daily.    No facility-administered encounter medications on file as of 05/31/2022.  Contacted [name] for General Review Call   Adherence Review:  Does the Clinical Pharmacist Assistant have access to adherence rates? Yes Adherence rates for STAR metric medications  Lisinopril 10 mg - Last filled 04/19/22 90 DS at [facility] Lisinopril 10 mg - Last filled 02/03/22 90 DS at [facility] Metformin 500 mg - Last filled 04/09/22 90 DS at [facility] Metformin 500 mg - Last filled 03/27/22 30 DS at [facility] Pravastatin 20 mg - Last filled 04/19/22 90 DS at [facility] Pravastatin 20 mg - Last filled 02/03/22 90 DS at  [facility]  Does the patient have >5 day gap between last estimated fill dates for any of the above medications or other medication gaps?  No    Disease State Questions:  Able to connect with Patient? {yes/no:20286} Did patient have any problems with their health recently? {yes/no:20286} Note problems and Concerns: Have you had any admissions or emergency room visits or worsening of your condition(s) since last visit? {yes/no:20286} Details of ED visit, hospital visit and/or worsening condition(s): Have you had any visits with new specialists or providers since your last visit? {yes/no:20286} Explain: Have you had any new health care problem(s) since your last visit? {yes/no:20286} New problem(s) reported: Have you run out of any of your medications since you last spoke with clinical pharmacist? {yes/no:20286} What caused you to run out of your medications? Are there any medications you are not taking as prescribed? {yes/no:20286} What kept you from taking your medications as prescribed? Are you having any issues or side effects with your medications? {yes/no:20286} Note of issues or side effects: Do you have any other health concerns or questions you want to discuss with your Clinical Pharmacist before your next visit? {yes/no:20286} Note additional concerns and questions from Patient. Are there any health concerns that you feel  we can do a better job addressing? {yes/no:20286} Note Patient's response. Are you having any problems with any of the following since the last visit: (select all that apply)  {General Call:27390}  Details: 12. Any falls since last visit? {yes/no:20286}  Details: 13. Any increased or uncontrolled pain since last visit? {yes/no:20286}  Details: 14. Next visit Type: {Telephone/Office:25179}       Visit with:        Date:        Time:  90. Additional Details? {yes/no:20286}    Care Gaps: TDAP - Overdue COVID Booster - Postponed Zoster Vaccine - Postponed CCM- Need help with sensors? BP- 120/60 05/29/22 AWV- 7/23 Lab Results  Component Value Date   HGBA1C 11.7 (A) 03/09/2022     Star Rating Drugs: Lisinopril 10 mg - Last filled 04/19/22 90 DS at [facility] Metformin 500 mg - Last filled 04/09/22 90 DS at [facility] Pravastatin 20 mg - Last filled 04/19/22 90 DS at [place] Pharmacist Assistant [phone]

## 2022-06-01 DIAGNOSIS — H401121 Primary open-angle glaucoma, left eye, mild stage: Secondary | ICD-10-CM | POA: Diagnosis not present

## 2022-06-01 DIAGNOSIS — M069 Rheumatoid arthritis, unspecified: Secondary | ICD-10-CM | POA: Diagnosis not present

## 2022-06-01 DIAGNOSIS — H40021 Open angle with borderline findings, high risk, right eye: Secondary | ICD-10-CM | POA: Diagnosis not present

## 2022-06-01 DIAGNOSIS — Z79899 Other long term (current) drug therapy: Secondary | ICD-10-CM | POA: Diagnosis not present

## 2022-06-01 LAB — HM DIABETES EYE EXAM

## 2022-06-03 ENCOUNTER — Encounter: Payer: Self-pay | Admitting: Family Medicine

## 2022-06-04 ENCOUNTER — Telehealth: Payer: Self-pay

## 2022-06-04 NOTE — Patient Outreach (Signed)
  Care Coordination   06/04/2022 Name: Deborah Travis MRN: [id number] DOB: [birth year]   Care Coordination Outreach Attempts:  A third unsuccessful outreach was attempted today to offer the patient with information about available care coordination services as a benefit of their health plan.   Follow Up Plan:  No further outreach attempts will be made at this time. We have been unable to contact the patient to offer or enroll patient in care coordination services  Encounter Outcome:  No Answer  Care Coordination Interventions Activated:  No   Care Coordination Interventions:  No, not indicated      Enzo Montgomery, RN,BSN,CCM [facility] Telephonic Care Management Coordinator Direct Phone: [phone] Toll Free: [phone] Fax: [phone]

## 2022-06-05 ENCOUNTER — Encounter: Payer: Self-pay | Admitting: Internal Medicine

## 2022-06-15 DIAGNOSIS — Z794 Long term (current) use of insulin: Secondary | ICD-10-CM | POA: Diagnosis not present

## 2022-06-15 DIAGNOSIS — E118 Type 2 diabetes mellitus with unspecified complications: Secondary | ICD-10-CM | POA: Diagnosis not present

## 2022-06-18 ENCOUNTER — Encounter: Payer: Self-pay | Admitting: Internal Medicine

## 2022-06-20 ENCOUNTER — Encounter: Payer: Self-pay | Admitting: Family Medicine

## 2022-06-20 ENCOUNTER — Telehealth (INDEPENDENT_AMBULATORY_CARE_PROVIDER_SITE_OTHER): Payer: Medicare HMO | Admitting: Medical

## 2022-06-20 ENCOUNTER — Encounter: Payer: Self-pay | Admitting: *Deleted

## 2022-06-20 ENCOUNTER — Other Ambulatory Visit: Payer: Self-pay | Admitting: Internal Medicine

## 2022-06-20 VITALS — Temp 100.5°F | Ht 63.0 in | Wt 102.0 lb

## 2022-06-20 DIAGNOSIS — R509 Fever, unspecified: Secondary | ICD-10-CM

## 2022-06-20 DIAGNOSIS — J029 Acute pharyngitis, unspecified: Secondary | ICD-10-CM | POA: Diagnosis not present

## 2022-06-20 NOTE — Progress Notes (Signed)
Subjective:     Patient ID: Deborah Travis, female   DOB: Dec 26, 1940, 81 y.o.   MRN: [id number]  This visit type was conducted due to national recommendations for restrictions regarding the COVID-19 Pandemic (e.g. social distancing) in an effort to limit this patient's exposure and mitigate transmission in our community.  Due to their co-morbid illnesses, this patient is at least at moderate risk for complications without adequate follow up.  This format is felt to be most appropriate for this patient at this time.    Documentation for virtual audio and video telecommunications through [place] encounter:  The patient was located at home. The provider was located in the office. The patient did consent to this visit and is aware of possible charges through their insurance for this visit.  The other persons participating in this telemedicine service were none. Time spent on call was 20 minutes and in review of previous records 20 minutes total.  This virtual service is not related to other E/M service within previous 7 days.   HPI Chief Complaint  Patient presents with   Sore Throat    ST that started last night and chest hurting. Has had a low grade fever. Has not done any home covid tests-does not have any at home.    Virtual for illness  She notes 1 day hx/o symptoms including sore throat, chest hurting.  Felt some chest discomfort when she went to bed.  This morning throat was sore, feels like lumps in throat/beside esophagus.  Has headache, low grade fever.   No cough, no NVD.  No sick contacts.    No heartburn, no recent acidic or spicy foods.    Fever up to today 100.5.  Nonsmoker.  Last illness.  Sugars running ok.  Less appetite today.  Using nothing for symptoms.  No head congestion, no runny nose or sneezing.  No other aggravating or relieving factors. No other complaint.    Past Medical History:  Diagnosis Date   Chronic kidney disease    RENAL STONE   Chronic  pancreatitis (HCC)    Diabetes mellitus    Duodenal ulcer    Dyslipidemia    GERD (gastroesophageal reflux disease)    Hypertension    Osteopenia    Thyroid disease    HYPOTHYROID   Vitamin D deficiency    Current Outpatient Medications on File Prior to Visit  Medication Sig Dispense Refill   b complex vitamins capsule Take 1 capsule by mouth daily.     Calcium-Magnesium-Zinc 333-133-5 MG TABS Take by mouth.     cholecalciferol (VITAMIN D3) 25 MCG (1000 UT) tablet Take 1,000 Units by mouth 2 (two) times a day.     Continuous Blood Gluc Sensor (FREESTYLE LIBRE 3 SENSOR) MISC Place 1 sensor on the skin every 14 days. Use to check glucose continuously 6 each 0   hydroxychloroquine (PLAQUENIL) 200 MG tablet TAKE 1 TABLET EVERY DAY MONDAY THROUGH FRIDAY ONLY AND TAKE 1/2 TABLET ON SATURDAYS AND SUNDAYS 72 tablet 0   insulin glargine, 2 Unit Dial, (TOUJEO MAX SOLOSTAR) 300 UNIT/ML Solostar Pen Inject 10 Units into the skin daily. (Patient taking differently: Inject 8 Units into the skin daily.) 9 mL 1   Insulin Pen Needle (BD PEN NEEDLE NANO 2ND GEN) 32G X 4 MM MISC 1 each by Does not apply route daily. 90 each 1   leflunomide (ARAVA) 20 MG tablet TAKE 1 TABLET EVERY DAY 90 tablet 0   levothyroxine (SYNTHROID) 125 MCG tablet  TAKE 1 TABLET EVERY DAY 90 tablet 1   lisinopril (ZESTRIL) 10 MG tablet TAKE 1 TABLET EVERY DAY 90 tablet 0   metFORMIN (GLUCOPHAGE-XR) 500 MG 24 hr tablet Take 2 tablets (1,000 mg total) by mouth daily with breakfast. 60 tablet 2   Multiple Vitamin (MULITIVITAMIN WITH MINERALS) TABS Take 1 tablet by mouth daily.     omeprazole (PRILOSEC) 40 MG capsule TAKE 1 CAPSULE (40 MG TOTAL) BY MOUTH DAILY. 90 capsule 1   pravastatin (PRAVACHOL) 20 MG tablet TAKE 1 TABLET EVERY DAY 90 tablet 0   VITAMIN E COMPLEX PO Take 1 capsule by mouth daily.      ZENPEP 25000-79000 units CPEP TAKE 3 CAPSULES WITH BREAKFAST, LUNCH AND DINNER AND TAKE 1 CAPSULE WITH EACH SNACK (3 A DAY) 1000  capsule 3   Zinc Sulfate (ZINC 15 PO) Take 15 mg by mouth daily.     ACCU-CHEK GUIDE test strip TEST BLOOD SUGAR ONE TIME DAILY AS DIRECTED (Patient not taking: Reported on 04/06/2022) 200 strip 1   Accu-Chek Softclix Lancets lancets TEST BLOOD SUGAR TWICE DAILY (Patient not taking: Reported on 04/06/2022) 200 each 1   Accu-Chek Softclix Lancets lancets TEST BLOOD SUGAR TWICE DAILY (Patient not taking: Reported on 04/06/2022) 200 each 1   Alcohol Swabs (B-D SINGLE USE SWABS BUTTERFLY) PADS 1 each by Does not apply route 2 (two) times daily. (Patient not taking: Reported on 04/06/2022) 300 each 3   Blood Glucose Calibration (ACCU-CHEK AVIVA) SOLN Use as directed (Patient not taking: Reported on 04/06/2022) 3 each 0   Blood Glucose Monitoring Suppl (ACCU-CHEK AVIVA PLUS) w/Device KIT Use as directed Patient is to test BID DX E11.9 (Patient not taking: Reported on 04/06/2022) 1 kit 0   Coenzyme Q10 (CO Q 10) 10 MG CAPS Take 10 mg by mouth daily.  (Patient not taking: Reported on 06/20/2022)     Menthol (RICOLA) LOZG Use as directed 1 each in the mouth or throat as needed. (Patient not taking: Reported on 05/04/2022)     No current facility-administered medications on file prior to visit.    Review of Systems As in subjective    Objective:   Physical Exam Due to coronavirus pandemic stay at home measures, patient visit was virtual and they were not examined in person.   Temp (!) 100.5 F (38.1 C) (Oral)   Ht 5' 3"  (1.6 m)   Wt 102 lb (46.3 kg)   LMP  (LMP Unknown)   BMI 18.07 kg/m   Gen: wd,wn, nad No witnessed wheezing or labored breathing Answers questions appropriately     Assessment:     Encounter Diagnoses  Name Primary?   Sore throat Yes   Fever, unspecified fever cause        Plan:     We discussed symptoms and concerns.  She is only had symptoms for 1 day and does not sound terribly severe  She will consider coming in tomorrow or Friday for swabs including flu COVID and  strep swabs  Advised rest, hydration, salt water gargles, warm fluids, sore throat spray over-the-counter, Tylenol for fever or aches.  If much worse overnight then count tomorrow morning.  We discussed differential which could include viral pharyngitis, strep, COVID or other  [place]ha was seen today for sore throat.  Diagnoses and all orders for this visit:  Sore throat  Fever, unspecified fever cause  F/u in back parking lot for swabs tomorrow

## 2022-06-20 NOTE — Progress Notes (Unsigned)
Office Visit Note  Patient: Deborah Travis             Date of Birth: 08-19-1941           MRN: [id number]             PCP: Deborah Lung, MD Referring: Deborah Lung, MD Visit Date: 07/03/2022 Occupation: '@GUAROCC'$ @  Subjective:  Right wrist pain   History of Present Illness: Deborah Travis is a 81 y.o. female with history of seronegative rheumatoid arthritis, DDD, and osteopenia.  Patient remains on Arava 20 mg 1 tablet by mouth daily, Plaquenil 200 mg 1 tablet by mouth daily Monday through Friday and half tablet on Saturday and sundays.  She is tolerating combination therapy without any side effects.  Patient reports for the past 2 to 3 days she has had increased discomfort in her right wrist joint.  She denies any joint swelling or joint stiffness at this time.  She denies any other joint discomfort or inflammation.  She denies any new medical conditions.  She establish care with a dietitian last week for better management of her diabetes.  She denies any recent or recurrent infections.  She received the annual flu shot.  She has not yet scheduled an updated DEXA.  She is taking calcium vitamin D supplement daily.     Activities of Daily Living:  Patient reports morning stiffness for 0 minutes.   Patient Denies nocturnal pain.  Difficulty dressing/grooming: Reports Difficulty climbing stairs: Denies Difficulty getting out of chair: Denies Difficulty using hands for taps, buttons, cutlery, and/or writing: Reports  Review of Systems  Constitutional:  Negative for fatigue.  HENT:  Negative for mouth sores and mouth dryness.   Eyes:  Positive for dryness.  Respiratory:  Negative for shortness of breath.   Cardiovascular:  Negative for chest pain and palpitations.  Gastrointestinal:  Negative for blood in stool, constipation and diarrhea.  Endocrine: Positive for increased urination.  Genitourinary:  Negative for involuntary urination.  Musculoskeletal:  Positive for joint  pain and joint pain. Negative for gait problem, joint swelling, myalgias, muscle weakness, morning stiffness, muscle tenderness and myalgias.  Skin:  Negative for color change, rash, hair loss and sensitivity to sunlight.  Allergic/Immunologic: Negative for susceptible to infections.  Neurological:  Negative for dizziness and headaches.  Hematological:  Negative for swollen glands.  Psychiatric/Behavioral:  Positive for sleep disturbance. Negative for depressed mood. The patient is not nervous/anxious.     PMFS History:  Patient Active Problem List   Diagnosis Date Noted   Aortic atherosclerosis ([place]) 06/23/2021   Diabetic nephropathy associated with type 2 diabetes mellitus ([place]) 02/06/2021   Stage 3b chronic kidney disease ([place]) 02/06/2021   Chronic calcific pancreatitis ([place]) 08/12/2020   Atherosclerosis 08/08/2018   Glaucoma 05/26/2018   Hypertensive retinopathy 05/26/2018   Seronegative rheumatoid arthritis ([place]) 03/04/2018   Senile purpura ([place]) 02/01/2015   Former smoker 10/12/2013   History of peptic ulcer disease 07/13/2013   Osteopenia 05/07/2013   Hypothyroidism 01/12/2012   Type 2 diabetes with complication ([place]) 38/46/6599   Hypertension associated with diabetes ([place]) 05/21/2011   Exocrine pancreatic insufficiency 05/21/2011   Hyperlipidemia associated with type 2 diabetes mellitus ([place]) 05/21/2011    Past Medical History:  Diagnosis Date   Chronic kidney disease    RENAL STONE   Chronic pancreatitis ([facility])    Diabetes mellitus    Duodenal ulcer    Dyslipidemia    GERD (gastroesophageal reflux disease)  Hypertension    Osteopenia    Thyroid disease    HYPOTHYROID   Vitamin D deficiency     Family History  Problem Relation Age of Onset   Stroke Mother    Heart disease Father    Healthy Daughter    Healthy Daughter    Emphysema Daughter    Diabetes Daughter    Hernia Daughter    Past Surgical History:  Procedure Laterality Date   APPENDECTOMY      CATARACT EXTRACTION Right 2014   CHOLECYSTECTOMY     TONSILECTOMY, ADENOIDECTOMY, BILATERAL MYRINGOTOMY AND TUBES     WHIPPLE PROCEDURE  1998   Social History   Social History Narrative   Not on file   Immunization History  Administered Date(s) Administered   Fluad Quad(high Dose 65+) 06/01/2019, 05/10/2020, 06/23/2021, 05/04/2022   Influenza Split 05/21/2011, 06/12/2012, 07/15/2013   Influenza, High Dose Seasonal PF 07/13/2013, 07/01/2014, 06/01/2015, 06/15/2016, 04/15/2017, 05/19/2018   PFIZER Comirnaty(Gray Top)Covid-19 Tri-Sucrose Vaccine 01/25/2021   PFIZER(Purple Top)SARS-COV-2 Vaccination 09/18/2019, 10/09/2019   Pneumococcal Conjugate-13 06/15/2016   Pneumococcal Polysaccharide-23 06/12/2012   Tdap 06/28/2011   Zoster, Live 06/27/2013     Objective: Vital Signs: BP 115/74 (BP Location: Left Arm, Patient Position: Sitting, Cuff Size: Normal)   Pulse 81   Resp 15   Ht '5\' 3"'$  (1.6 m)   Wt 101 lb 3.2 oz (45.9 kg)   LMP  (LMP Unknown)   BMI 17.93 kg/m    Physical Exam Vitals and nursing note reviewed.  Constitutional:      Appearance: She is well-developed.  HENT:     Head: Normocephalic and atraumatic.  Eyes:     Conjunctiva/sclera: Conjunctivae normal.  Cardiovascular:     Rate and Rhythm: Normal rate and regular rhythm.     Heart sounds: Normal heart sounds.  Pulmonary:     Effort: Pulmonary effort is normal.     Breath sounds: Normal breath sounds.  Abdominal:     General: Bowel sounds are normal.     Palpations: Abdomen is soft.  Musculoskeletal:     Cervical back: Normal range of motion.  Skin:    General: Skin is warm and dry.     Capillary Refill: Capillary refill takes less than 2 seconds.  Neurological:     Mental Status: She is alert and oriented to person, place, and time.  Psychiatric:        Behavior: Behavior normal.      Musculoskeletal Exam: C-spine has good ROM. No midline spinal tenderness or SI joint tenderness.  Shoulder joints and  elbow joints have good ROM.  Limited ROM of right wrist.  Tenderness over the right wrist but no synovitis noted.  No tenderness or synovitis of MCP joints.  PIP and DIP thickening consistent with osteoarthritis of both hands.  Hip joints have good ROM with no groin pain.  Knee joints have good ROM with no warmth or effusion.  Ankle joints have good ROM with no tenderness or joint swelling.   CDAI Exam: CDAI Score: 1.8  Patient Global: 4 mm; Provider Global: 4 mm Swollen: 0 ; Tender: 1  Joint Exam 07/03/2022      Right  Left  Wrist   Tender        Investigation: No additional findings.  Imaging: No results found.  Recent Labs: Lab Results  Component Value Date   WBC 9.4 05/11/2022   HGB 12.2 05/11/2022   PLT 184 05/11/2022   NA 136 05/11/2022   K  4.1 05/11/2022   CL 103 05/11/2022   CO2 24 05/11/2022   GLUCOSE 184 (H) 05/11/2022   BUN 25 (H) 05/11/2022   CREATININE 1.08 (H) 05/11/2022   BILITOT 0.4 01/30/2022   ALKPHOS 77 03/09/2020   AST 15 01/30/2022   ALT 13 01/30/2022   PROT 7.3 01/30/2022   ALBUMIN 3.4 (L) 03/09/2020   CALCIUM 9.5 05/11/2022   GFRAA 40 (L) 01/27/2021   QFTBGOLDPLUS NEGATIVE 03/30/2019    Speciality Comments: PLQ Eye Exam:06/01/2022 WNL @ [facility] Follow up in 8 months   Procedures:  No procedures performed Allergies: Levofloxacin and Codeine   Assessment / Plan:     Visit Diagnoses: Seronegative rheumatoid arthritis ([place]) - RF-,CCP-, 14-3-3 eta negative, Uric acid 4.8, ANA negative, no erosive changes on XR of hands: Patient presents today with some increased tenderness in the right wrist which started 2 to 3 days ago.  She has not been performing any overuse activities and attributes her increased discomfort to weather changes.  She has not noticed any joint swelling or joint stiffness in her right wrist joint.  She has been taking Arava 20 mg 1 tablet by mouth daily and Plaquenil 200 mg 1 tablet by mouth daily Monday through Friday and  half tablet on Saturday and Sundays.  She is tolerating combination therapy without any side effects and has not missed any doses recently.  Overall she continues to find arava and Plaquenil to be effective at managing her symptoms.  She is not experiencing other joint pain or inflammation at this time.  She has not a good candidate for prednisone due to uncontrolled type 2 diabetes.  She established care with a dietitian last week which she feels will be helpful. She will remain on Areva and Plaquenil as prescribed.  She was advised to notify us if she develops increased joint pain or joint swelling.  She will follow-up in the office in 5 months or sooner if needed.  High risk medication use - Arava 20 mg 1 tablet by mouth daily, Plaquenil 200 mg 1 tablet by mouth daily Monday through Friday and half tablet on Saturday and sundays.  PLQ Eye Exam:06/01/2022 WNL @ [facility].  CBC and BMP drawn on 05/11/2022.  Patient will be having updated lab work with her PCP next month.  Standing orders for CBC and CMP were placed today. She has not had any recent or recurrent infections.  Discussed the importance of holding [place] if she develops signs or symptoms of an infection and to resume once the infection has completely cleared. She received annual flu shot.  - Plan: CBC with Differential/Platelet, COMPLETE METABOLIC PANEL WITH GFR  Trigger little finger of right hand - Resolved  Trigger finger, right ring finger - Resolved  DDD (degenerative disc disease), cervical: She has good range of motion of the C-spine with no discomfort at this time.  No symptoms of radiculopathy.  Osteopenia of multiple sites - DEXA on 02/18/18: The BMD measured at Femur Total Right is 0.700 g/cm2 with a T-score of -2.4.  Order placed by Dr. Redmond School.  Patient plans on scheduling an updated DEXA in January 2024.  She is taking calcium vitamin D supplement daily.  Vitamin D deficiency: She is taking calcium vitamin D supplement  daily.  Other medical conditions are listed as follows:  Senile purpura ([place])  History of chronic pancreatitis  Hypertension associated with diabetes ([place]): Blood pressure was 115/74 today in the office.  History of peptic ulcer  disease  Hyperlipidemia associated with type 2 diabetes mellitus ([place])  History of gastroesophageal reflux (GERD)  History of hypothyroidism  Former smoker  Orders: Orders Placed This Encounter  Procedures   CBC with Differential/Platelet   COMPLETE METABOLIC PANEL WITH GFR   No orders of the defined types were placed in this encounter.    Follow-Up Instructions: Return in about 5 months (around 12/02/2022) for Rheumatoid arthritis.   Ofilia Neas, PA-C  Note - This record has been created using Dragon software.  Chart creation errors have been sought, but may not always  have been located. Such creation errors do not reflect on  the standard of medical care.

## 2022-06-28 ENCOUNTER — Encounter: Payer: Medicare HMO | Attending: Family Medicine | Admitting: Dietician

## 2022-06-28 ENCOUNTER — Encounter: Payer: Self-pay | Admitting: Dietician

## 2022-06-28 VITALS — Ht 63.0 in | Wt 101.0 lb

## 2022-06-28 DIAGNOSIS — E118 Type 2 diabetes mellitus with unspecified complications: Secondary | ICD-10-CM | POA: Diagnosis not present

## 2022-06-28 NOTE — Progress Notes (Signed)
Diabetes Self-Management Education  Visit Type: First/Initial  Appt. Start Time: 0915 Appt. End Time: 3220  06/28/2022  Deborah Travis, identified by name and date of birth, is a 81 y.o. female with a diagnosis of Diabetes: Type 2.   ASSESSMENT Patient is here today alone.  She started on insulin 2-3 months ago and stated that she has had more problems controlling her blood glucose.  She would like to learn more about blood glucose control.  Referral:  Type 2 Diabetes History includes Type 2 diabetes, GER, dyslipidemia,HTN, CKD, hypothyroid, osteopenia, vitamin D deficiency, s/p Whipple procedure 08/1996 due to chronic pancreatitis, exocrine pancreatic insufficiency (diarrhea resolved with pancreatic enzyme addition with all meals and snacks) Pneumonia in November 2019 and since has had decreased taste.  She lost to 82 lbs. Medications include Toujeo 8 units once daily 2 hours after lunch, Metformin XR, pravastatin, lisinopril, Zenpep with meals and snacks, CoQ10, calcium, magnesium, zinc, vitamin B-complex, MVI, synthroid, vitamin D3, vitamin E Labs noted to include A1C 11.7% 03/09/2022 increased from 8.8% 11/21/2021, eGFR 53 05/11/2022 CGM:  Libre 2 Average glucose worsened from 186 over the past 90 days to 240 over the past 2 weeks. Fasting 135 this am and 256 after 2 cups unsweetened tea and 1 banana, decreased to 205 currently. HI sensor reading 10/31 at 9:30 and 11:30 pm (patient did not feel well but no nausea or vomiting, fasting 380 11/1 am.  She did not double check sensor reading with a finger stick.  CGM Results from download:   % Time CGM active:   89 %   (Goal >70%)  Average glucose:   240 mg/dL for the past 14 days  Glucose management indicator:    Time in range (70-180 mg/dL):   21 %   (Goal >70%)  Time High (181-250 mg/dL):   25 %   (Goal < 25%)  Time Very High (>250 mg/dL):    50 %   (Goal < 5%)  Time Low (54-69 mg/dL):   4 %   (Goal <4%)  Time Very Low (<54  mg/dL):   0 %   (Goal <1%)  Coefficient of variation:      Weight hx: 63" 101 lbs 06/28/22 UBW 97-102 lbs She is happy with current weight. 140 lbs 2011 when diagnosed with diabetes  Patient lives with her daughter who is a Marine scientist and her family.  She has a camper on the property and spends much of her time there.  She does much of her cooking and shopping.   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 Walks daily, stretching. Avoids bread, limits potatoes and other starches. Stress:  currently high as daughter has had 2 knee surgeries and other daughter had a broken ankle and has been helping both of them.  She picks up grandchild from school. Sleep:  very good  Height _0  (1.6 m), weight 101 lb (45.8 kg). Body mass index is 17.89 kg/m.   Diabetes Self-Management Education - 06/28/22 0925       Visit Information   Visit Type First/Initial      Initial Visit   Diabetes Type Type 2    Date Diagnosed 2011    Are you currently following a meal plan? No    Are you taking your medications as prescribed? Yes      Health Coping   How would you rate your overall health? Good      Psychosocial Assessment  Patient Belief/Attitude about Diabetes Motivated to manage diabetes    What is the hardest part about your diabetes right now, causing you the most concern, or is the most worrisome to you about your diabetes?   Other (comment);Getting support / problem solving   blood glucose control   Self-care barriers None    Self-management support Doctor's office    Other persons present Patient    Patient Concerns Nutrition/Meal planning;Glycemic Control    Special Needs None    Preferred Learning Style No preference indicated    Learning Readiness Ready    How often do you need to have someone help you when you read instructions, pamphlets, or other written materials from your doctor or pharmacy? 1 - Never    What is the last grade level you completed in  school? college      Pre-Education Assessment   Patient understands the diabetes disease and treatment process. Needs Review    Patient understands incorporating nutritional management into lifestyle. Needs Review    Patient undertands incorporating physical activity into lifestyle. Needs Review    Patient understands using medications safely. Needs Review    Patient understands monitoring blood glucose, interpreting and using results Needs Review    Patient understands prevention, detection, and treatment of acute complications. Needs Review    Patient understands prevention, detection, and treatment of chronic complications. Needs Review    Patient understands how to develop strategies to address psychosocial issues. Needs Review    Patient understands how to develop strategies to promote health/change behavior. Needs Review      Complications   Last HgB A1C per patient/outside source 11.7 %   03/09/2022 increased from 8.8% 11/21/2021   How often do you check your blood sugar? > 4 times/day    Fasting Blood glucose range (mg/dL) 130-179    Postprandial Blood glucose range (mg/dL) >200    Number of hyperglycemic episodes ( >272m/dL): Daily    Can you tell when your blood sugar is high? Yes    What do you do if your blood sugar is high? nothing    Have you had a dilated eye exam in the past 12 months? Yes    Have you had a dental exam in the past 12 months? Yes    Are you checking your feet? Yes    How many days per week are you checking your feet? 7      Dietary Intake   Breakfast 2 mugs tea, banana    Snack (morning) chobani yogurt    Lunch [employer] side (rice, broccoli, cheese)    Snack (afternoon) sugar free chocolate pudding (2 cups)    Dinner chicken with gravy, green beans    Snack (evening) rare smart food popcorn with cheddar    Beverage(s) sugar free gingerale, water, 2% milk, hot tea with stevia      Activity / Exercise   Activity / Exercise Type Light (walking / raking  leaves)    How many days per week do you exercise? 7    How many minutes per day do you exercise? 30    Total minutes per week of exercise 210      Patient Education   Previous Diabetes Education Yes (please comment)   12 years ago when diagnosed   Disease Pathophysiology Explored patient's options for treatment of their diabetes;Definition of diabetes, type 1 and 2, and the diagnosis of diabetes    Healthy Eating Role of diet in the treatment of diabetes and  the relationship between the three main macronutrients and blood glucose level;Meal options for control of blood glucose level and chronic complications.;Meal timing in regards to the patients' current diabetes medication.;Plate Method    Being Active Role of exercise on diabetes management, blood pressure control and cardiac health.    Medications Reviewed patients medication for diabetes, action, purpose, timing of dose and side effects.    Monitoring Taught/evaluated CGM (comment);Daily foot exams    Acute complications Discussed and identified patients' prevention, symptoms, and treatment of hyperglycemia.    Diabetes Stress and Support Identified and addressed patients feelings and concerns about diabetes;Role of stress on diabetes      Individualized Goals (developed by patient)   Nutrition General guidelines for healthy choices and portions discussed    Physical Activity Exercise 5-7 days per week;30 minutes per day    Medications take my medication as prescribed    Monitoring  Consistenly use CGM    Problem Solving Medication consistency    Reducing Risk examine blood glucose patterns;treat hypoglycemia with 15 grams of carbs if blood glucose less than 36m/dL    Health Coping Ask for help with psychological, social, or emotional issues      Post-Education Assessment   Patient understands the diabetes disease and treatment process. Comprehends key points    Patient understands incorporating nutritional management into  lifestyle. Comprehends key points    Patient undertands incorporating physical activity into lifestyle. Comprehends key points    Patient understands using medications safely. Comphrehends key points    Patient understands monitoring blood glucose, interpreting and using results Comprehends key points    Patient understands prevention, detection, and treatment of acute complications. Comprehends key points    Patient understands prevention, detection, and treatment of chronic complications. Comprehends key points    Patient understands how to develop strategies to address psychosocial issues. Comprehends key points    Patient understands how to develop strategies to promote health/change behavior. Comprehends key points      Outcomes   Expected Outcomes Demonstrated interest in learning. Expect positive outcomes    Future DMSE 2 months    Program Status Not Completed             Individualized Plan for Diabetes Self-Management Training:   Learning Objective:  Patient will have a greater understanding of diabetes self-management. Patient education plan is to attend individual and/or group sessions per assessed needs and concerns.   Plan:   Patient Instructions  Increase your vegetable intake particularly greens and salad as tolerated Add legumes (beans or lentils) daily. Eat a diet that is low in saturated fat. Choose whole grains most often Cook steel cut oatmeal in bulk once per week and reheat.  Top with nuts for protein.  Small portion of fresh fruit such as berries. Double check blood glucose with a finger stick when symptoms do not match sensor. If you develop diarrhea, decrease your fiber to a tolerated amount. Continue to take your pancreatic enzymes with each meal and snack and other medications as prescribed.   Expected Outcomes:  Demonstrated interest in learning. Expect positive outcomes  Education material provided: Meal plan card, ASalemof  LIfestyle Medicine Handout  If problems or questions, patient to contact team via:  Phone  Future DSME appointment: 2 months

## 2022-06-28 NOTE — Patient Instructions (Addendum)
Increase your vegetable intake particularly greens and salad as tolerated Add legumes (beans or lentils) daily. Eat a diet that is low in saturated fat. Choose whole grains most often Cook steel cut oatmeal in bulk once per week and reheat.  Top with nuts for protein.  Small portion of fresh fruit such as berries. Double check blood glucose with a finger stick when symptoms do not match sensor. If you develop diarrhea, decrease your fiber to a tolerated amount. Continue to take your pancreatic enzymes with each meal and snack and other medications as prescribed.

## 2022-07-03 ENCOUNTER — Telehealth: Payer: Self-pay | Admitting: Dietician

## 2022-07-03 ENCOUNTER — Encounter: Payer: Self-pay | Admitting: Physician Assistant

## 2022-07-03 ENCOUNTER — Ambulatory Visit: Payer: Medicare HMO | Attending: Physician Assistant | Admitting: Physician Assistant

## 2022-07-03 VITALS — BP 115/74 | HR 81 | Resp 15 | Ht 63.0 in | Wt 101.2 lb

## 2022-07-03 DIAGNOSIS — M65341 Trigger finger, right ring finger: Secondary | ICD-10-CM

## 2022-07-03 DIAGNOSIS — Z8711 Personal history of peptic ulcer disease: Secondary | ICD-10-CM

## 2022-07-03 DIAGNOSIS — I152 Hypertension secondary to endocrine disorders: Secondary | ICD-10-CM

## 2022-07-03 DIAGNOSIS — M0600 Rheumatoid arthritis without rheumatoid factor, unspecified site: Secondary | ICD-10-CM | POA: Diagnosis not present

## 2022-07-03 DIAGNOSIS — K529 Noninfective gastroenteritis and colitis, unspecified: Secondary | ICD-10-CM

## 2022-07-03 DIAGNOSIS — D692 Other nonthrombocytopenic purpura: Secondary | ICD-10-CM | POA: Diagnosis not present

## 2022-07-03 DIAGNOSIS — E559 Vitamin D deficiency, unspecified: Secondary | ICD-10-CM | POA: Diagnosis not present

## 2022-07-03 DIAGNOSIS — Z8719 Personal history of other diseases of the digestive system: Secondary | ICD-10-CM | POA: Diagnosis not present

## 2022-07-03 DIAGNOSIS — M8589 Other specified disorders of bone density and structure, multiple sites: Secondary | ICD-10-CM

## 2022-07-03 DIAGNOSIS — Z79899 Other long term (current) drug therapy: Secondary | ICD-10-CM

## 2022-07-03 DIAGNOSIS — E785 Hyperlipidemia, unspecified: Secondary | ICD-10-CM

## 2022-07-03 DIAGNOSIS — M5030 Other cervical disc degeneration, unspecified cervical region: Secondary | ICD-10-CM | POA: Diagnosis not present

## 2022-07-03 DIAGNOSIS — E1169 Type 2 diabetes mellitus with other specified complication: Secondary | ICD-10-CM

## 2022-07-03 DIAGNOSIS — M65351 Trigger finger, right little finger: Secondary | ICD-10-CM

## 2022-07-03 DIAGNOSIS — E1159 Type 2 diabetes mellitus with other circulatory complications: Secondary | ICD-10-CM

## 2022-07-03 DIAGNOSIS — Z8639 Personal history of other endocrine, nutritional and metabolic disease: Secondary | ICD-10-CM

## 2022-07-03 DIAGNOSIS — Z87891 Personal history of nicotine dependence: Secondary | ICD-10-CM

## 2022-07-03 NOTE — Telephone Encounter (Signed)
Attempted to call patient 3 times in the past 2 days to obtain updated blood glucose readings.  Left a message earlier today to call our office but currently voice mail is full.  She was seen by myself on 06/28/2022 with concerns of blood glucose control.  Results from that visit are below.  HI sensor reading 10/31 at 9:30 and 11:30 pm (patient did not feel well but no nausea or vomiting, fasting 380 11/1 am.  She did not double check sensor reading with a finger stick.   CGM Results from download:    % Time CGM active:   89 %   (Goal >70%)  Average glucose:   240 mg/dL for the past 14 days  Glucose management indicator:    Time in range (70-180 mg/dL):   21 %   (Goal >70%)  Time High (181-250 mg/dL):   25 %   (Goal < 25%)  Time Very High (>250 mg/dL):    50 %   (Goal < 5%)  Time Low (54-69 mg/dL):   4 %   (Goal <4%)  Time Very Low (<54 mg/dL):   0 %   (Goal <1%)  Coefficient of variation:     Patient has an MD appointment on 08/10/2022 to further discuss her diabetes.  If she returns my call, will recommend that she make an earlier MD appointment if there are concerns with current CGM readings.  Antonieta Iba, RD, LDN, CDCES

## 2022-07-11 ENCOUNTER — Other Ambulatory Visit: Payer: Medicare HMO

## 2022-07-11 MED ORDER — FREESTYLE LIBRE 2 SENSOR MISC: 1 refills | Status: DC

## 2022-07-12 ENCOUNTER — Other Ambulatory Visit: Payer: Self-pay | Admitting: Family Medicine

## 2022-07-12 DIAGNOSIS — E118 Type 2 diabetes mellitus with unspecified complications: Secondary | ICD-10-CM

## 2022-07-14 ENCOUNTER — Other Ambulatory Visit: Payer: Self-pay | Admitting: Physician Assistant

## 2022-07-14 DIAGNOSIS — M0600 Rheumatoid arthritis without rheumatoid factor, unspecified site: Secondary | ICD-10-CM

## 2022-07-16 NOTE — Telephone Encounter (Signed)
Next Visit: 12/04/2022  Last Visit: 07/03/2022  Labs: 05/11/2022 CBC and BMP  Eye exam: 06/01/2022    Current Dose per office note on 12/04/2022:  Plaquenil 200 mg 1 tablet by mouth daily Monday through Friday and half tablet on Saturday and sundays.    [id number] rheumatoid arthritis   Last Fill: 02/26/2022  Okay to refill Plaquenil?

## 2022-08-04 DIAGNOSIS — R059 Cough, unspecified: Secondary | ICD-10-CM | POA: Diagnosis not present

## 2022-08-04 DIAGNOSIS — B338 Other specified viral diseases: Secondary | ICD-10-CM | POA: Diagnosis not present

## 2022-08-04 DIAGNOSIS — J209 Acute bronchitis, unspecified: Secondary | ICD-10-CM | POA: Diagnosis not present

## 2022-08-07 ENCOUNTER — Telehealth: Payer: Medicare HMO | Admitting: Family Medicine

## 2022-08-07 DIAGNOSIS — R509 Fever, unspecified: Secondary | ICD-10-CM

## 2022-08-07 NOTE — Progress Notes (Signed)
Not seen

## 2022-08-07 NOTE — Progress Notes (Deleted)
   Subjective:    Patient ID: Deborah Travis, female    DOB: 1941-03-16, 81 y.o.   MRN: [id number]  HPI    Review of Systems     Objective:   Physical Exam        Assessment & Plan:

## 2022-08-08 NOTE — Progress Notes (Signed)
no

## 2022-08-09 ENCOUNTER — Telehealth: Payer: Self-pay | Admitting: Family Medicine

## 2022-08-09 NOTE — Telephone Encounter (Signed)
Deborah Travis tested positive for RSV last Saturday and she has an in office appointment tomorrow. Does she need to test again before coming in the office or is she ok to come in without testing?

## 2022-08-10 ENCOUNTER — Encounter: Payer: Self-pay | Admitting: Family Medicine

## 2022-08-10 ENCOUNTER — Ambulatory Visit (INDEPENDENT_AMBULATORY_CARE_PROVIDER_SITE_OTHER): Payer: Medicare HMO | Admitting: Family Medicine

## 2022-08-10 VITALS — BP 120/80 | HR 81 | Resp 17 | Wt 102.4 lb

## 2022-08-10 DIAGNOSIS — E1159 Type 2 diabetes mellitus with other circulatory complications: Secondary | ICD-10-CM

## 2022-08-10 DIAGNOSIS — E1169 Type 2 diabetes mellitus with other specified complication: Secondary | ICD-10-CM

## 2022-08-10 DIAGNOSIS — I152 Hypertension secondary to endocrine disorders: Secondary | ICD-10-CM | POA: Diagnosis not present

## 2022-08-10 DIAGNOSIS — E118 Type 2 diabetes mellitus with unspecified complications: Secondary | ICD-10-CM | POA: Diagnosis not present

## 2022-08-10 DIAGNOSIS — E785 Hyperlipidemia, unspecified: Secondary | ICD-10-CM | POA: Diagnosis not present

## 2022-08-10 DIAGNOSIS — B338 Other specified viral diseases: Secondary | ICD-10-CM

## 2022-08-10 LAB — POCT GLYCOSYLATED HEMOGLOBIN (HGB A1C): Hemoglobin A1C: 8.7 % — AB (ref 4.0–5.6)

## 2022-08-10 NOTE — Progress Notes (Signed)
Subjective:    Patient ID: Deborah Travis, female    DOB: 1941/03/04, 81 y.o.   MRN: [id number]  Deborah Travis is a 81 y.o. female who presents for follow-up of Type 2 diabetes mellitus.  Home blood sugar records:  averaging around 400   -has been on Prednisone for treatment of RSV infection. Took last dose of prednisone this morning. Current symptoms/problems include polyuria and have been unchanged. Daily foot checks: yes   Any foot concerns: no How often blood sugars checked: has continuous glucose monitor (Freestyle Libre 2) Exercise: Home exercise routine includes walks daily and stretching. Diet: diabetic diet. 3 meals daily Approximately 10 days ago she developed rhinorrhea and coughing and continued to do poorly.  On December 9 she had worsening cough and shortness of breath as well as malaise.  Her COVID test was negative RSV was positive.  She was given steroids as well as Augmentin in an urgent care center.  She finished her last steroid pill today.  Her blood sugars have been very elevated.  She is taking 10 units of Toujeo as well as metformin.  She has seen the dietitian once and plans to see her again in January 2. The following portions of the patient's history were reviewed and updated as appropriate: allergies, current medications, past medical history, past social history and problem list.  ROS as in subjective above.     Objective:    Physical Exam Alert and in no distress otherwise not examined.  Hemoglobin A1c is 8.7  Lab Review    Latest Ref Rng & Units 08/10/2022    9:27 AM 05/11/2022   12:53 PM 03/09/2022   12:39 PM 03/09/2022   12:00 PM 01/30/2022   11:24 AM  Diabetic Labs  HbA1c 4.0 - 5.6 % 8.7   11.7     Chol 100 - 199 mg/dL    129    HDL >39 mg/dL    60    Calc LDL 0 - 99 mg/dL    48    Triglycerides 0 - 149 mg/dL    116    Creatinine 0.44 - 1.00 mg/dL  1.08    1.05       08/10/2022    9:13 AM 07/03/2022   10:00 AM 06/28/2022    9:16 AM 06/20/2022     3:07 PM 05/29/2022   10:19 AM  BP/Weight  Systolic BP 751 025   852  Diastolic BP 80 74   60  Wt. (Lbs) 102.4 101.2 101 102 102.4  BMI 18.14 kg/m2 17.93 kg/m2 17.89 kg/m2 18.07 kg/m2 18.14 kg/m2      Latest Ref Rng & Units 06/01/2022   12:00 AM 03/09/2022   10:30 AM  Foot/eye exam completion dates  Eye Exam No Retinopathy No Retinopathy       Foot Form Completion   Done     This result is from an external source.        Assessment & Plan:    Type 2 diabetes with complication ([place]) - Plan: POCT glycosylated hemoglobin (Hb A1C)  Hypertension associated with diabetes ([place])  Hyperlipidemia associated with type 2 diabetes mellitus ([place])  RSV (respiratory syncytial virus infection) She no longer has any prednisone and I told her to stop taking the Augmentin.  Explained that it will take quite some time for her blood sugars to quiet down.  Discussed diet exercise and medication with her.  She has discussed this with the dietitian in terms of even  possibly going to multiple insulin dosings although I would like to avoid this if we can.  She will recheck with me approximately 1 month after seeing the dietitian. I explained that at this time since she is not having any further difficulty from the RSV no further intervention is needed.  Discussed the fact there is no real good therapy for RSV.

## 2022-08-15 DIAGNOSIS — E118 Type 2 diabetes mellitus with unspecified complications: Secondary | ICD-10-CM | POA: Diagnosis not present

## 2022-08-28 ENCOUNTER — Encounter: Payer: Self-pay | Admitting: Dietician

## 2022-08-28 ENCOUNTER — Other Ambulatory Visit: Payer: Self-pay | Admitting: Internal Medicine

## 2022-08-28 ENCOUNTER — Encounter: Payer: 59 | Attending: Family Medicine | Admitting: Dietician

## 2022-08-28 VITALS — Wt 100.7 lb

## 2022-08-28 DIAGNOSIS — Z794 Long term (current) use of insulin: Secondary | ICD-10-CM | POA: Insufficient documentation

## 2022-08-28 DIAGNOSIS — M0600 Rheumatoid arthritis without rheumatoid factor, unspecified site: Secondary | ICD-10-CM

## 2022-08-28 DIAGNOSIS — E0869 Diabetes mellitus due to underlying condition with other specified complication: Secondary | ICD-10-CM | POA: Diagnosis not present

## 2022-08-28 DIAGNOSIS — K8681 Exocrine pancreatic insufficiency: Secondary | ICD-10-CM

## 2022-08-28 DIAGNOSIS — E118 Type 2 diabetes mellitus with unspecified complications: Secondary | ICD-10-CM

## 2022-08-28 DIAGNOSIS — E1159 Type 2 diabetes mellitus with other circulatory complications: Secondary | ICD-10-CM

## 2022-08-28 MED ORDER — METFORMIN HCL ER 500 MG PO TB24: ORAL_TABLET | ORAL | 1 refills | Status: DC

## 2022-08-28 MED ORDER — OMEPRAZOLE 40 MG PO CPDR: 40.0000 mg | DELAYED_RELEASE_CAPSULE | Freq: Every day | ORAL | 1 refills | Status: DC

## 2022-08-28 MED ORDER — LEVOTHYROXINE SODIUM 125 MCG PO TABS: 125.0000 ug | ORAL_TABLET | Freq: Every day | ORAL | 1 refills | Status: DC

## 2022-08-28 MED ORDER — LISINOPRIL 10 MG PO TABS: 10.0000 mg | ORAL_TABLET | Freq: Every day | ORAL | 1 refills | Status: DC

## 2022-08-28 NOTE — Telephone Encounter (Signed)
[facility]:  Lisinopril Metformin Omezprazole Hydroxychloroquine - ok to refill? Leflunomide- ok to refill? levothryozine

## 2022-08-28 NOTE — Patient Instructions (Addendum)
Aim for small meals/snacks throughout the day.  15-30 grams carbohydrates with each meal How do the meals/snack that you eat effect your blood glucose sugar.  It is normal for it to increase 40-60 points.  If your blood glucose is high before a meal or snack, choose fewer carbs such as just a protein and vegetables or a protein snack without carbs. Small frequent meals to maintain your weight and nutrition status. Make your meals quality nutrition.  Lower carb snack options: Nuts Cheese Lower sugar greek yogurt such as less sugar Chobani (11 grams Carbs) or Two Good (4 grams carbs) 1 oz leftover meat Small portion of leftovers  Other things that affect your blood glucose include: Stress Pain Illness Lack of sleep  Consider Journaling your intake:  What you ate and the portion size.  What your glucose was before the meal and 2 hours after  Activity    Choose the unsweetened almond milk Stevia is fine  Increase your vegetable intake particularly greens and salad as tolerated Add legumes (beans or lentils) daily. Eat a diet that is low in saturated fat. Choose whole grains most often If you develop diarrhea, decrease your fiber to a tolerated amount. Continue to take your pancreatic enzymes with each meal and snack and other medications as prescribed. When you forget to take your insulin, take when you remember this.

## 2022-08-28 NOTE — Progress Notes (Signed)
Diabetes Self-Management Education  Visit Type: Follow-up  Appt. Start Time: 0945 Appt. End Time: 1030  08/28/2022  Deborah Travis, identified by name and date of birth, is a 82 y.o. female with a diagnosis of Diabetes: Type 2.   ASSESSMENT RSV treated with steroid shot and prednisone about 3 weeks ago. Stress improved although not sleeping well due to elderly dog. Made her cookies with stevia this year but does not eat them.  Occasional cake.  Referral:  Type 2 Diabetes History includes Type 2 diabetes, GER, dyslipidemia,HTN, CKD, hypothyroid, osteopenia, vitamin D deficiency, s/p Whipple procedure 08/1996 due to chronic pancreatitis, exocrine pancreatic insufficiency (diarrhea resolved with pancreatic enzyme addition with all meals and snacks) Pneumonia in November 2019 and since has had decreased taste.  She lost to 82 lbs. Medications include Toujeo 8 units once daily 2 hours after lunch, Metformin XR, pravastatin, lisinopril, Zenpep with meals and snacks, CoQ10, calcium, magnesium, zinc, vitamin B-complex, MVI, synthroid, vitamin D3, vitamin E Labs noted to include A1C 8.7% 08/10/2022 decreased from 11.7% 03/09/2022  eGFR 53 05/11/2022 CGM:  Libre 2  278 sensor reading 08/28/22 and 123 08/27/22. 165 sensor reading in office now Lowest blood glucose in the past 48 hours 115   CGM Results from download:  06/28/2022 08/28/2022 x7  % Time CGM active:   89 %   (Goal >70%) 88  Average glucose:   240 mg/dL for the past 14 days 277 x 7 days and 304 for the past 14 days.  Glucose management indicator:     Time in range (70-180 mg/dL):   21 %   (Goal >70%) 12%  Time High (181-250 mg/dL):   25 %   (Goal < 25%) 17%  Time Very High (>250 mg/dL):    50 %   (Goal < 5%) 71%  Time Low (54-69 mg/dL):   4 %   (Goal <4%) 0  Time Very Low (<54 mg/dL):   0 %   (Goal <1%) 0   Weight: 63" 100.7 lbs 08/28/2021 101 lbs 06/28/22 UBW 97-102 lbs She is happy with current weight. 140 lbs 2011 when diagnosed  with diabetes   Patient lives with her daughter who is a Marine scientist and her family.  She has a camper on the property and spends much of her time there.  She does much of her cooking and shopping.   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 Walks daily, stretching. Avoids bread, limits potatoes and other starches. Stress:  currently high as daughter has had 2 knee surgeries and other daughter had a broken ankle and has been helping both of them.  She picks up grandchild from school. Sleep:  poor now as her elderly dog wakes her every 2 hours to get out Exercise:  stretches, walking in yard  Weight 100 lb 11.2 oz (45.7 kg). Body mass index is 17.84 kg/m.   Diabetes Self-Management Education - 08/28/22 1053       Visit Information   Visit Type Follow-up      Initial Visit   Diabetes Type Type 2    Date Diagnosed 2011    Are you currently following a meal plan? No    Are you taking your medications as prescribed? Yes      Psychosocial Assessment   Patient Belief/Attitude about Diabetes Motivated to manage diabetes    Self-care barriers None    Self-management support Doctor's office;CDE visits    Other persons present Patient  Patient Concerns Nutrition/Meal planning    Special Needs None    Preferred Learning Style No preference indicated    Learning Readiness Ready    How often do you need to have someone help you when you read instructions, pamphlets, or other written materials from your doctor or pharmacy? 1 - Never      Pre-Education Assessment   Patient understands the diabetes disease and treatment process. Demonstrates understanding / competency    Patient understands incorporating nutritional management into lifestyle. Needs Review    Patient undertands incorporating physical activity into lifestyle. Demonstrates understanding / competency    Patient understands using medications safely. Demonstrates understanding / competency    Patient  understands monitoring blood glucose, interpreting and using results Demonstrates understanding / competency    Patient understands prevention, detection, and treatment of acute complications. Demonstrates understanding / competency    Patient understands how to develop strategies to address psychosocial issues. Demonstrates understanding / competency    Patient understands how to develop strategies to promote health/change behavior. Comprehends key points      Complications   Last HgB A1C per patient/outside source 8.7 %    How often do you check your blood sugar? 0 times/day (not testing)    Fasting Blood glucose range (mg/dL) >200;180-200    Number of hypoglycemic episodes per month 0      Dietary Intake   Breakfast hot tea with stevia (caffeinated), banana    Lunch skips at times    Dinner baked chicken, green beans, 2/3 cup brown rice    Snack (evening) occasional smart food popcorn with cheddar    Beverage(s) water, sugar free gingerale, hot tea with stevia      Patient Education   Previous Diabetes Education Yes (please comment)   06/2022   Healthy Eating Meal options for control of blood glucose level and chronic complications.;Other (comment)   small frequent meals with small amounts of carbs throughout the day.  importance of maintaining weight.   Being Active Role of exercise on diabetes management, blood pressure control and cardiac health.    Medications Reviewed patients medication for diabetes, action, purpose, timing of dose and side effects.    Monitoring Taught/evaluated CGM (comment)    Diabetes Stress and Support Role of stress on diabetes;Identified and addressed patients feelings and concerns about diabetes;Worked with patient to identify barriers to care and solutions      Individualized Goals (developed by patient)   Nutrition General guidelines for healthy choices and portions discussed;Carb counting;Follow meal plan discussed    Physical Activity Exercise 3-5  times per week;30 minutes per day    Medications take my medication as prescribed    Monitoring  Consistenly use CGM    Problem Solving Sleep Pattern;Eating Pattern;Medication consistency      Patient Self-Evaluation of Goals - Patient rates self as meeting previously set goals (% of time)   Nutrition >75% (most of the time)    Physical Activity 25 - 50% (sometimes)    Medications >75% (most of the time)    Monitoring >75% (most of the time)    Problem Solving and behavior change strategies  50 - 75 % (half of the time)    Reducing Risk (treating acute and chronic complications) 50 - 75 % (half of the time)    Health Coping 50 - 75 % (half of the time)      Post-Education Assessment   Patient understands the diabetes disease and treatment process. Demonstrates understanding / competency  Patient understands incorporating nutritional management into lifestyle. Comprehends key points    Patient undertands incorporating physical activity into lifestyle. Demonstrates understanding / competency    Patient understands using medications safely. Demonstrates understanding / competency    Patient understands monitoring blood glucose, interpreting and using results Demonstrates understanding / competency    Patient understands prevention, detection, and treatment of acute complications. Demonstrates understanding / competency    Patient understands prevention, detection, and treatment of chronic complications. Demonstrates understanding / competency    Patient understands how to develop strategies to address psychosocial issues. Demonstrates understanding / competency    Patient understands how to develop strategies to promote health/change behavior. Comprehends key points      Outcomes   Expected Outcomes Demonstrated interest in learning. Expect positive outcomes    Future DMSE 4-6 wks    Program Status Not Completed      Subsequent Visit   Since your last visit have you continued or begun  to take your medications as prescribed? Yes    Since your last visit have you experienced any weight changes? No change             Individualized Plan for Diabetes Self-Management Training:   Learning Objective:  Patient will have a greater understanding of diabetes self-management. Patient education plan is to attend individual and/or group sessions per assessed needs and concerns.   Plan:   Patient Instructions  Aim for small meals/snacks throughout the day.  15-30 grams carbohydrates with each meal How do the meals/snack that you eat effect your blood glucose sugar.  It is normal for it to increase 40-60 points.  If your blood glucose is high before a meal or snack, choose fewer carbs such as just a protein and vegetables or a protein snack without carbs. Small frequent meals to maintain your weight and nutrition status. Make your meals quality nutrition.  Lower carb snack options: Nuts Cheese Lower sugar greek yogurt such as less sugar Chobani (11 grams Carbs) or Two Good (4 grams carbs) 1 oz leftover meat Small portion of leftovers  Other things that affect your blood glucose include: Stress Pain Illness Lack of sleep  Consider Journaling your intake:  What you ate and the portion size.  What your glucose was before the meal and 2 hours after  Activity    Choose the unsweetened almond milk Stevia is fine  Increase your vegetable intake particularly greens and salad as tolerated Add legumes (beans or lentils) daily. Eat a diet that is low in saturated fat. Choose whole grains most often If you develop diarrhea, decrease your fiber to a tolerated amount. Continue to take your pancreatic enzymes with each meal and snack and other medications as prescribed. When you forget to take your insulin, take when you remember this.   Expected Outcomes:  Demonstrated interest in learning. Expect positive outcomes  Education material provided: Meal plan card  If  problems or questions, patient to contact team via:  Phone  Future DSME appointment: 4-6 wks

## 2022-09-07 ENCOUNTER — Telehealth: Payer: Self-pay | Admitting: Family Medicine

## 2022-09-07 DIAGNOSIS — E118 Type 2 diabetes mellitus with unspecified complications: Secondary | ICD-10-CM

## 2022-09-07 NOTE — Telephone Encounter (Signed)
Pharmacy sent request for freestyle libre sensor  And penneedle [id number] comfrt Please send to the   [facility]

## 2022-09-10 NOTE — Telephone Encounter (Signed)
I tried calling patient to verify that she wants these prescriptions sent to [facility] since they are not listed in her chart as a preferred pharmacy. Left message to call back.

## 2022-09-11 ENCOUNTER — Telehealth: Payer: Self-pay | Admitting: Family Medicine

## 2022-09-11 NOTE — Telephone Encounter (Signed)
Pt called me this am and stated that she had received a call from her new insurance [employer] and they had left a message stating that [facility] had been trying to get in touch with [place] to get a list of pt's medication and we had not responded. I could not find anything from [facility] and advised pt that I would call and check into. I called and was advised by [facility] that no record of any correspondence either from or to patient was noted on her account. I was advised that nothing was requested or needed for her at this time. I called pt and a message was left advising pt.

## 2022-09-11 NOTE — Telephone Encounter (Signed)
Tried calling patient. Left message to call back. 

## 2022-09-12 NOTE — Telephone Encounter (Signed)
Tried calling patient. No answer. Left message to call back.  

## 2022-09-14 ENCOUNTER — Telehealth: Payer: Self-pay | Admitting: Family Medicine

## 2022-09-14 NOTE — Telephone Encounter (Signed)
New pharmacy [facility] sent refill request for zenpep Lisinopril Metformin Omeprazole Hydroxychlorpquine Leflunomide Synthriod Left message to see if she wanted them to be sent there

## 2022-09-15 DIAGNOSIS — E118 Type 2 diabetes mellitus with unspecified complications: Secondary | ICD-10-CM | POA: Diagnosis not present

## 2022-09-18 ENCOUNTER — Encounter: Payer: Self-pay | Admitting: Family Medicine

## 2022-09-18 ENCOUNTER — Ambulatory Visit (INDEPENDENT_AMBULATORY_CARE_PROVIDER_SITE_OTHER): Payer: 59 | Admitting: Family Medicine

## 2022-09-18 VITALS — BP 124/76 | HR 78 | Temp 97.7°F | Resp 18 | Wt 102.6 lb

## 2022-09-18 DIAGNOSIS — I152 Hypertension secondary to endocrine disorders: Secondary | ICD-10-CM

## 2022-09-18 DIAGNOSIS — E1159 Type 2 diabetes mellitus with other circulatory complications: Secondary | ICD-10-CM

## 2022-09-18 DIAGNOSIS — E118 Type 2 diabetes mellitus with unspecified complications: Secondary | ICD-10-CM

## 2022-09-18 MED ORDER — METFORMIN HCL ER 500 MG PO TB24: ORAL_TABLET | ORAL | 1 refills | Status: DC

## 2022-09-18 MED ORDER — LISINOPRIL 10 MG PO TABS: 10.0000 mg | ORAL_TABLET | Freq: Every day | ORAL | 3 refills | Status: AC

## 2022-09-18 MED ORDER — LEVOTHYROXINE SODIUM 125 MCG PO TABS: 125.0000 ug | ORAL_TABLET | Freq: Every day | ORAL | 3 refills | Status: AC

## 2022-09-18 MED ORDER — ZENPEP 25000-79000 UNITS PO CPEP: ORAL_CAPSULE | ORAL | 3 refills | Status: AC

## 2022-09-18 NOTE — Patient Instructions (Signed)
Get back into the nutritionist in the next week or 2 and after you see her then come here after you have been able to adjust your diet based on that for a couple weeks

## 2022-09-18 NOTE — Progress Notes (Signed)
Subjective:    Patient ID: SAHRA CONVERSE, female    DOB: 1941-01-17, 82 y.o.   MRN: [id number]  MARJA ADDERLEY is a 82 y.o. female who presents for follow-up of Type 2 diabetes mellitus.  Home blood sugar records:  has freestyle [place]. Average blood sugar is  between 152-231 Current symptoms/problems include none and have been stable. Daily foot checks: yes   Any foot concerns: no How often blood sugars checked: has freestyle Libre Exercise: Home exercise routine includes walking daily. Diet: Low carb diet She is on 8 units of insulin.  She has been to the nutritionist.  Review of the [facility] indicates only 1 episode of hypoglycemia that occurred in the middle of the night.  Most of her readings are quite elevated and when I quizzed her concerning her eating habits she cannot be really very specific. The following portions of the patient's history were reviewed and updated as appropriate: allergies, current medications, past medical history, past social history and problem list.  ROS as in subjective above.     Objective:    Physical Exam Alert and in no distress otherwise not examined.  Blood pressure 124/76, pulse 78, temperature 97.7 F (36.5 C), temperature source Oral, resp. rate 18, weight 102 lb 9.6 oz (46.5 kg), SpO2 100 %.  Lab Review    Latest Ref Rng & Units 08/10/2022    9:27 AM 05/11/2022   12:53 PM 03/09/2022   12:39 PM 03/09/2022   12:00 PM 01/30/2022   11:24 AM  Diabetic Labs  HbA1c 4.0 - 5.6 % 8.7   11.7     Chol 100 - 199 mg/dL    129    HDL >39 mg/dL    60    Calc LDL 0 - 99 mg/dL    48    Triglycerides 0 - 149 mg/dL    116    Creatinine 0.44 - 1.00 mg/dL  1.08    1.05       09/18/2022    9:58 AM 08/28/2022    9:48 AM 08/10/2022    9:13 AM 07/03/2022   10:00 AM 06/28/2022    9:16 AM  BP/Weight  Systolic BP 789  381 017   Diastolic BP 76  80 74   Wt. (Lbs) 102.6 100.7 102.4 101.2 101  BMI 18.17 kg/m2 17.84 kg/m2 18.14 kg/m2 17.93 kg/m2 17.89 kg/m2       Latest Ref Rng & Units 06/01/2022   12:00 AM 03/09/2022   10:30 AM  Foot/eye exam completion dates  Eye Exam No Retinopathy No Retinopathy       Foot Form Completion   Done     This result is from an external source.    Jatoria  reports that she quit smoking about 6 years ago. Her smoking use included cigarettes. She smoked an average of .1 packs per day. She has been exposed to tobacco smoke. She has never used smokeless tobacco. She reports that she does not drink alcohol and does not use drugs.     Assessment & Plan:    Type 2 diabetes with complication ([facility]) Difficult to truly make adjustments right now is she has had 1 episode of hypoglycemia that occurred in the middle of the night and her blood sugars during the day seem to be all over the map.  I will have her discuss this further with nutritionist and try to adjust her eating habits rather than her insulin right now for fear of having her  drop out the bottom again.  After she has had a chance to talk to nutritionist she is to come back here after roughly 2 weeks so we can look at her blood sugars again and make further adjustments. She is not on a new insurance plan and I will need to renew her medications.

## 2022-10-02 ENCOUNTER — Other Ambulatory Visit: Payer: Self-pay

## 2022-10-02 DIAGNOSIS — E118 Type 2 diabetes mellitus with unspecified complications: Secondary | ICD-10-CM

## 2022-10-02 MED ORDER — FREESTYLE LIBRE 2 SENSOR MISC: 1 refills | Status: DC

## 2022-10-02 MED ORDER — BD PEN NEEDLE NANO 2ND GEN 32G X 4 MM MISC: 1.0000 | Freq: Every day | 1 refills | Status: AC

## 2022-10-16 DIAGNOSIS — E118 Type 2 diabetes mellitus with unspecified complications: Secondary | ICD-10-CM | POA: Diagnosis not present

## 2022-11-02 ENCOUNTER — Ambulatory Visit: Payer: Medicare HMO | Admitting: Dietician

## 2022-11-05 ENCOUNTER — Telehealth: Payer: Self-pay | Admitting: Family Medicine

## 2022-11-05 DIAGNOSIS — I152 Hypertension secondary to endocrine disorders: Secondary | ICD-10-CM | POA: Diagnosis not present

## 2022-11-05 DIAGNOSIS — F17210 Nicotine dependence, cigarettes, uncomplicated: Secondary | ICD-10-CM | POA: Diagnosis not present

## 2022-11-05 DIAGNOSIS — E785 Hyperlipidemia, unspecified: Secondary | ICD-10-CM | POA: Diagnosis not present

## 2022-11-05 DIAGNOSIS — E1159 Type 2 diabetes mellitus with other circulatory complications: Secondary | ICD-10-CM | POA: Diagnosis not present

## 2022-11-05 DIAGNOSIS — E039 Hypothyroidism, unspecified: Secondary | ICD-10-CM | POA: Diagnosis not present

## 2022-11-05 DIAGNOSIS — E1169 Type 2 diabetes mellitus with other specified complication: Secondary | ICD-10-CM | POA: Diagnosis not present

## 2022-11-05 DIAGNOSIS — N1832 Chronic kidney disease, stage 3b: Secondary | ICD-10-CM | POA: Diagnosis not present

## 2022-11-05 NOTE — Telephone Encounter (Signed)
[place] medical records request received. Forwarding to [facility]. Left message for pt to call back to let us know if transferring out.

## 2022-11-06 ENCOUNTER — Other Ambulatory Visit: Payer: Self-pay

## 2022-11-06 DIAGNOSIS — M0600 Rheumatoid arthritis without rheumatoid factor, unspecified site: Secondary | ICD-10-CM

## 2022-11-06 MED ORDER — HYDROXYCHLOROQUINE SULFATE 200 MG PO TABS: ORAL_TABLET | ORAL | 0 refills | Status: DC

## 2022-11-06 NOTE — Telephone Encounter (Signed)
Patient contacted the office requesting a refill of Plaquenil be sent to [facility].   Next Visit: 12/04/2022  Last Visit: 07/03/2022  Labs: 05/11/2022 CBC WNL   11/05/2022 CMP Glucose 142 Creatinine 1.11 eGFR 51 Alkaline Phosphate 148  Eye exam: 06/01/2022 WNL   Current Dose per office note 07/03/2022: Plaquenil 200 mg 1 tablet by mouth daily Monday through Friday and half tablet on Saturday and sundays   [id number] rheumatoid arthritis ([place])   Last Fill: 07/17/2022  Patient to update labs during [place] appointment.   Okay to refill Plaquenil?

## 2022-11-08 ENCOUNTER — Encounter: Payer: 59 | Attending: Family Medicine | Admitting: Dietician

## 2022-11-08 DIAGNOSIS — Z794 Long term (current) use of insulin: Secondary | ICD-10-CM | POA: Insufficient documentation

## 2022-11-08 DIAGNOSIS — E0869 Diabetes mellitus due to underlying condition with other specified complication: Secondary | ICD-10-CM | POA: Insufficient documentation

## 2022-11-12 ENCOUNTER — Other Ambulatory Visit: Payer: Self-pay | Admitting: Family Medicine

## 2022-11-12 DIAGNOSIS — E118 Type 2 diabetes mellitus with unspecified complications: Secondary | ICD-10-CM

## 2022-11-13 NOTE — Telephone Encounter (Signed)
Lab Results  Component Value Date   HGBA1C 8.7 (A) 08/10/2022   Is it ok to refill? When should patient come back in for a follow up A1C check?

## 2022-11-15 ENCOUNTER — Encounter: Payer: 59 | Admitting: Dietician

## 2022-11-15 ENCOUNTER — Encounter: Payer: Self-pay | Admitting: Dietician

## 2022-11-15 VITALS — Wt 104.0 lb

## 2022-11-15 DIAGNOSIS — Z794 Long term (current) use of insulin: Secondary | ICD-10-CM

## 2022-11-15 DIAGNOSIS — E118 Type 2 diabetes mellitus with unspecified complications: Secondary | ICD-10-CM | POA: Diagnosis not present

## 2022-11-15 DIAGNOSIS — E0869 Diabetes mellitus due to underlying condition with other specified complication: Secondary | ICD-10-CM | POA: Diagnosis not present

## 2022-11-15 NOTE — Patient Instructions (Addendum)
Add a protein to breakfast (nuts, peanut butter, almond butter, cheese, greek yogurt).  Aim for small meals/snacks throughout the day.           30 grams carbohydrates with each meal and less than 15 grams for a snack. How do the meals/snack that you eat effect your blood glucose sugar.  It is normal for it to increase 40-60 points.  If your blood glucose is high before a meal or snack, choose fewer carbs such as just a protein and vegetables or a protein snack without carbs. Make your meals quality nutrition.   Lower carb snack options: Nuts Cheese Lower sugar greek yogurt such as less sugar Chobani (11 grams Carbs) or Two Good (4 grams carbs) 1 oz leftover meat Small portion of leftovers   Other things that affect your blood glucose include: Stress Pain Illness Lack of sleep Activity  Aim to be active most every day.   Increase your vegetable intake particularly greens and salad as tolerated Add legumes (beans or lentils) daily. Eat a diet that is low in saturated fat. Choose whole grains most often If you develop diarrhea, decrease your fiber to a tolerated amount. Continue to take your pancreatic enzymes with each meal and snack and other medications as prescribed. When you forget to take your insulin, take when you remember this.

## 2022-11-15 NOTE — Progress Notes (Signed)
Diabetes Self-Management Education  Visit Type: Follow-up  Appt. Start Time: 1015 Appt. End Time: [id number]  11/15/2022  [name], identified by name and date of birth, is a 82 y.o. female with a diagnosis of Diabetes: Type 2.   ASSESSMENT Patient now sees Nicholes Rough, [place] and would like for me to send all of her records to her.  She no longer sees Dr. Redmond School. She states that she has been started on meal time insulin, blood glucose is improving (see below) and states that she is feeling better. She eats small frequent meals.  Discussed that she should limit carbohydrate for snacks to less than 15 grams).  Patient is here today alone.  She was last seen by this RD 08/28/2022  Referral:  Type 2 Diabetes History includes Type 2 diabetes, GER, dyslipidemia,HTN, CKD, hypothyroid, osteopenia, vitamin D deficiency, s/p Whipple procedure 08/1996 due to chronic pancreatitis, exocrine pancreatic insufficiency (diarrhea resolved with pancreatic enzyme addition with all meals and snacks) Pneumonia in November 2019 and since has had decreased taste.  She lost to 82 lbs. Medications include Toujeo 10 units once daily 8-10 pm, Novolog 2 units before each meal, Metformin XR, pravastatin, lisinopril, Zenpep with meals and snacks, CoQ10, calcium, magnesium, zinc, vitamin B-complex, MVI, synthroid, vitamin D3, vitamin E Labs noted to include A1C 8.7% 08/10/2022 decreased from 11.7% 03/09/2022  eGFR 53 05/11/2022 CGM:  Libre 2 Sensor reading currently 250 after a banana and 4 cups unsweetened tea this am.   CGM Results from download:  06/28/2022 08/28/2022 x7 11/12/2022 x7  % Time CGM active:   89 %   (Goal >70%) 88 84  Average glucose:   240 mg/dL for the past 14 days 277 x 7 days and 304 for the past 14 days. 212 x 7 days  Glucose management indicator:       Time in range (70-180 mg/dL):   21 %   (Goal >70%) 12% 42%  Time High (181-250 mg/dL):   25 %   (Goal < 25%) 17% 22%  Time Very High (>250 mg/dL):     50 %   (Goal < 5%) 71% 36%  Time Low (54-69 mg/dL):   4 %   (Goal <4%) 0 0  Time Very Low (<54 mg/dL):   0 %   (Goal <1%) 0 0     Weight: 63" 104 lbs 11/15/2022 100.7 lbs 08/28/2021 101 lbs 06/28/22 UBW 97-102 lbs She is happy with current weight. 140 lbs 2011 when diagnosed with diabetes   Patient lives with her daughter who is a Marine scientist and her family.  She has a camper on the property and spends much of her time there.  She does much of her cooking and shopping.   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 Walks daily, stretching. Avoids bread, limits potatoes and other starches. Stress:  stress improved.  She picks up grandchild from school. Sleep:  improved sleep as she had to put her dog down in February Exercise:  stretches, walking in yard  Weight 104 lb (47.2 kg). Body mass index is 18.42 kg/m.   Diabetes Self-Management Education - 11/15/22 1500       Visit Information   Visit Type Follow-up      Initial Visit   Diabetes Type Type 2    Date Diagnosed 2011      Psychosocial Assessment   Patient Belief/Attitude about Diabetes Motivated to manage diabetes    Self-care barriers None  Self-management support Doctor's office;CDE visits    Other persons present Patient    Patient Concerns Nutrition/Meal planning;Glycemic Control;Support    Preferred Learning Style No preference indicated    Learning Readiness Ready    How often do you need to have someone help you when you read instructions, pamphlets, or other written materials from your doctor or pharmacy? 1 - Never      Pre-Education Assessment   Patient understands the diabetes disease and treatment process. Demonstrates understanding / competency    Patient understands incorporating nutritional management into lifestyle. Needs Review    Patient undertands incorporating physical activity into lifestyle. Demonstrates understanding / competency    Patient understands using medications  safely. Demonstrates understanding / competency    Patient understands monitoring blood glucose, interpreting and using results Demonstrates understanding / competency    Patient understands prevention, detection, and treatment of acute complications. Demonstrates understanding / competency    Patient understands prevention, detection, and treatment of chronic complications. Compreheands key points    Patient understands how to develop strategies to address psychosocial issues. Demonstrates understanding / competency    Patient understands how to develop strategies to promote health/change behavior. Comprehends key points      Complications   How often do you check your blood sugar? > 4 times/day    Fasting Blood glucose range (mg/dL) 70-129;130-179    Postprandial Blood glucose range (mg/dL) 180-200;>200    Number of hypoglycemic episodes per month 0    Number of hyperglycemic episodes ( >200mg /dL): Daily    Can you tell when your blood sugar is high? Yes      Dietary Intake   Breakfast large banana, 4 cups tea with stevia    Snack (morning) two good yogurt occasionally    Lunch knor side dish (pasta, cauliflower, broccoli, cheese    Dinner grilled steak, poasta salad    Snack (evening) none    Beverage(s) water, sugar free gingerale, tea with stevia      Activity / Exercise   Activity / Exercise Type Light (walking / raking leaves)    How many days per week do you exercise? 7    How many minutes per day do you exercise? 30    Total minutes per week of exercise 210      Patient Education   Previous Diabetes Education Yes (please comment)   08/2022   Healthy Eating Meal options for control of blood glucose level and chronic complications.    Medications Reviewed patients medication for diabetes, action, purpose, timing of dose and side effects.    Monitoring Taught/evaluated CGM (comment)      Individualized Goals (developed by patient)   Nutrition General guidelines for healthy  choices and portions discussed    Physical Activity Exercise 3-5 times per week;30 minutes per day    Medications take my medication as prescribed    Monitoring  Consistenly use CGM    Problem Solving Sleep Pattern    Reducing Risk examine blood glucose patterns    Health Coping Ask for help with psychological, social, or emotional issues      Patient Self-Evaluation of Goals - Patient rates self as meeting previously set goals (% of time)   Nutrition >75% (most of the time)    Physical Activity 25 - 50% (sometimes)    Medications >75% (most of the time)    Monitoring >75% (most of the time)    Problem Solving and behavior change strategies  50 - 75 % (half  of the time)    Reducing Risk (treating acute and chronic complications) 50 - 75 % (half of the time)    Health Coping 50 - 75 % (half of the time)      Post-Education Assessment   Patient understands the diabetes disease and treatment process. Demonstrates understanding / competency    Patient understands incorporating nutritional management into lifestyle. Comprehends key points    Patient undertands incorporating physical activity into lifestyle. Demonstrates understanding / competency    Patient understands using medications safely. Demonstrates understanding / competency    Patient understands monitoring blood glucose, interpreting and using results Comprehends key points    Patient understands prevention, detection, and treatment of acute complications. Demonstrates understanding / competency    Patient understands prevention, detection, and treatment of chronic complications. Demonstrates understanding / competency    Patient understands how to develop strategies to address psychosocial issues. Demonstrates understanding / competency    Patient understands how to develop strategies to promote health/change behavior. Comprehends key points      Outcomes   Expected Outcomes Demonstrated interest in learning. Expect positive  outcomes    Future DMSE 2 months    Program Status Not Completed      Subsequent Visit   Since your last visit have you experienced any weight changes? Gain    Weight Gain (lbs) 4             Individualized Plan for Diabetes Self-Management Training:   Learning Objective:  Patient will have a greater understanding of diabetes self-management. Patient education plan is to attend individual and/or group sessions per assessed needs and concerns.   Plan:   Patient Instructions  Add a protein to breakfast (nuts, peanut butter, almond butter, cheese, greek yogurt).  Aim for small meals/snacks throughout the day.           30 grams carbohydrates with each meal and less than 15 grams for a snack. How do the meals/snack that you eat effect your blood glucose sugar.  It is normal for it to increase 40-60 points.  If your blood glucose is high before a meal or snack, choose fewer carbs such as just a protein and vegetables or a protein snack without carbs. Make your meals quality nutrition.   Lower carb snack options: Nuts Cheese Lower sugar greek yogurt such as less sugar Chobani (11 grams Carbs) or Two Good (4 grams carbs) 1 oz leftover meat Small portion of leftovers   Other things that affect your blood glucose include: Stress Pain Illness Lack of sleep Activity  Aim to be active most every day.   Increase your vegetable intake particularly greens and salad as tolerated Add legumes (beans or lentils) daily. Eat a diet that is low in saturated fat. Choose whole grains most often If you develop diarrhea, decrease your fiber to a tolerated amount. Continue to take your pancreatic enzymes with each meal and snack and other medications as prescribed. When you forget to take your insulin, take when you remember this.   Expected Outcomes:  Demonstrated interest in learning. Expect positive outcomes  Education material provided:   If problems or questions, patient to contact  team via:  Phone  Future DSME appointment: 2 months

## 2022-11-20 NOTE — Progress Notes (Unsigned)
Office Visit Note  Patient: Deborah Travis             Date of Birth: 03-Oct-1940           MRN: [id number]             PCP: Ladora DanielBeal, Sheri, PA-C Referring: Ronnald NianLalonde, John C, MD Visit Date: 12/04/2022 Occupation: @GUAROCC @  Subjective:  Medication monitoring  History of Present Illness: Deborah BillingsRose M Steinhilber is a 82 y.o. female with history of seronegative rheumatoid arthritis and DDD.  Patient is currently taking Arava 20 mg 1 tablet by mouth daily, Plaquenil 200 mg 1 tablet daily Monday through Friday and half tab on Saturday and sundays.  She is tolerating combination therapy without any side effects and has not missed any doses recently.  She denies any signs or symptoms of a rheumatoid arthritis flare.  She has not been experiencing any morning stiffness or nocturnal pain.  She is some difficulty with writing but otherwise has no difficulty performing ADLs.  She has not noticed any joint swelling.  She denies any recent or recurrent infections.  She requested refills of both Plaquenil and Areva sent to the pharmacy today.  Activities of Daily Living:  Patient reports morning stiffness for 0 minutes  Patient Denies nocturnal pain.  Difficulty dressing/grooming: Denies Difficulty climbing stairs: Denies Difficulty getting out of chair: Denies Difficulty using hands for taps, buttons, cutlery, and/or writing: Reports  Review of Systems  Constitutional:  Negative for fatigue.  HENT:  Negative for mouth sores, mouth dryness and nose dryness.   Eyes:  Negative for pain, visual disturbance and dryness.  Respiratory:  Negative for cough, hemoptysis, shortness of breath and difficulty breathing.   Cardiovascular:  Negative for chest pain, palpitations, hypertension and swelling in legs/feet.  Gastrointestinal:  Negative for blood in stool, constipation and diarrhea.  Endocrine: Negative for increased urination.  Genitourinary:  Negative for painful urination.  Musculoskeletal:  Negative for joint  pain, joint pain, joint swelling, myalgias, muscle weakness, morning stiffness, muscle tenderness and myalgias.  Skin:  Negative for color change, pallor, rash, hair loss, nodules/bumps, skin tightness, ulcers and sensitivity to sunlight.  Allergic/Immunologic: Negative for susceptible to infections.  Neurological:  Negative for dizziness, numbness, headaches and weakness.  Hematological:  Negative for swollen glands.  Psychiatric/Behavioral:  Negative for depressed mood and sleep disturbance. The patient is not nervous/anxious.     PMFS History:  Patient Active Problem List   Diagnosis Date Noted   Aortic atherosclerosis 06/23/2021   Diabetic nephropathy associated with type 2 diabetes mellitus 02/06/2021   Stage 3b chronic kidney disease 02/06/2021   Chronic calcific pancreatitis 08/12/2020   Atherosclerosis 08/08/2018   Glaucoma 05/26/2018   Hypertensive retinopathy 05/26/2018   Seronegative rheumatoid arthritis 03/04/2018   Senile purpura 02/01/2015   Former smoker 10/12/2013   History of peptic ulcer disease 07/13/2013   Osteopenia 05/07/2013   Hypothyroidism 01/12/2012   Type 2 diabetes with complication 05/21/2011   Hypertension associated with diabetes 05/21/2011   Exocrine pancreatic insufficiency 05/21/2011   Hyperlipidemia associated with type 2 diabetes mellitus 05/21/2011    Past Medical History:  Diagnosis Date   Chronic kidney disease    RENAL STONE   Chronic pancreatitis    Diabetes mellitus    Duodenal ulcer    Dyslipidemia    GERD (gastroesophageal reflux disease)    Hypertension    Osteopenia    Thyroid disease    HYPOTHYROID   Vitamin D deficiency  Family History  Problem Relation Age of Onset   Stroke Mother    Heart disease Father    Healthy Daughter    Healthy Daughter    Emphysema Daughter    Diabetes Daughter    Hernia Daughter    Past Surgical History:  Procedure Laterality Date   APPENDECTOMY     CATARACT EXTRACTION Right 2014    CHOLECYSTECTOMY     TONSILECTOMY, ADENOIDECTOMY, BILATERAL MYRINGOTOMY AND TUBES     WHIPPLE PROCEDURE  1998   Social History   Social History Narrative   Not on file   Immunization History  Administered Date(s) Administered   Fluad Quad(high Dose 65+) 06/01/2019, 05/10/2020, 06/23/2021, 05/04/2022   Influenza Split 05/21/2011, 06/12/2012, 07/15/2013   Influenza, High Dose Seasonal PF 07/13/2013, 07/01/2014, 06/01/2015, 06/15/2016, 04/15/2017, 05/19/2018   PFIZER Comirnaty(Gray Top)Covid-19 Tri-Sucrose Vaccine 01/25/2021   PFIZER(Purple Top)SARS-COV-2 Vaccination 09/18/2019, 10/09/2019   Pneumococcal Conjugate-13 06/15/2016   Pneumococcal Polysaccharide-23 06/12/2012   Tdap 06/28/2011   Zoster, Live 06/27/2013     Objective: Vital Signs: BP 112/74 (BP Location: Left Arm, Patient Position: Sitting, Cuff Size: Normal)   Pulse 89   Resp 14   Ht 5\' 3"  (1.6 m)   Wt 104 lb 3.2 oz (47.3 kg)   LMP  (LMP Unknown)   BMI 18.46 kg/m    Physical Exam Vitals and nursing note reviewed.  Constitutional:      Appearance: She is well-developed.  HENT:     Head: Normocephalic and atraumatic.  Eyes:     Conjunctiva/sclera: Conjunctivae normal.  Cardiovascular:     Rate and Rhythm: Normal rate and regular rhythm.     Heart sounds: Normal heart sounds.  Pulmonary:     Effort: Pulmonary effort is normal.     Breath sounds: Normal breath sounds.  Abdominal:     General: Bowel sounds are normal.     Palpations: Abdomen is soft.  Musculoskeletal:     Cervical back: Normal range of motion.  Skin:    General: Skin is warm and dry.     Capillary Refill: Capillary refill takes less than 2 seconds.     Comments: Spooning of the fingernails noted  Neurological:     Mental Status: She is alert and oriented to person, place, and time.  Psychiatric:        Behavior: Behavior normal.      Musculoskeletal Exam: C-spine, thoracic spine, lumbar spine have good range of motion.  No midline  spinal tenderness or SI joint tenderness.  Shoulder joints, elbow joints, wrist joints, MCPs, PIPs, DIPs have good range of motion with no synovitis.  Subluxation of bilateral first MCP joints.  Prominence and thickening of bilateral CMC joints.  PIP and DIP thickening consistent with osteoarthritis of both hands.  Hip joints have good range of motion with no groin pain.  No tenderness over the trochanteric bursa bilaterally.  Knee joints have good range of motion with no warmth or effusion.  Ankle joints have good range of motion with no tenderness or joint swelling.  CDAI Exam: CDAI Score: -- Patient Global: 4 mm; Provider Global: 2 mm Swollen: --; Tender: -- Joint Exam 12/04/2022   No joint exam has been documented for this visit   There is currently no information documented on the homunculus. Go to the Rheumatology activity and complete the homunculus joint exam.  Investigation: No additional findings.  Imaging: No results found.  Recent Labs: Lab Results  Component Value Date   WBC 9.4 05/11/2022  HGB 12.2 05/11/2022   PLT 184 05/11/2022   NA 136 05/11/2022   K 4.1 05/11/2022   CL 103 05/11/2022   CO2 24 05/11/2022   GLUCOSE 184 (H) 05/11/2022   BUN 25 (H) 05/11/2022   CREATININE 1.08 (H) 05/11/2022   BILITOT 0.4 01/30/2022   ALKPHOS 77 03/09/2020   AST 15 01/30/2022   ALT 13 01/30/2022   PROT 7.3 01/30/2022   ALBUMIN 3.4 (L) 03/09/2020   CALCIUM 9.5 05/11/2022   GFRAA 40 (L) 01/27/2021   QFTBGOLDPLUS NEGATIVE 03/30/2019    Speciality Comments: PLQ Eye Exam:06/01/2022 WNL @ [facility] Follow up in 8 months   Procedures:  No procedures performed Allergies: Levofloxacin and Codeine   Assessment / Plan:     Visit Diagnoses: Seronegative rheumatoid arthritis - RF-,CCP-, 14-3-3 eta negative, Uric acid 4.8, ANA negative, no erosive changes on XR of hands: She has no tenderness or synovitis on examination today.  She has not had any signs or symptoms of a  rheumatoid arthritis flare.  She has clinically been doing well taking Arava 20 mg 1 tablet by mouth daily and Plaquenil as prescribed.  She is tolerating both medications without any side effects and has not missed any doses recently.  She has not been experiencing any morning stiffness or nocturnal pain.  She has some difficulty writing for prolonged period of time but has no other difficulty with ADLs.  No medication changes will be made at this time.  She is advised to notify us if she develops signs or symptoms of a flare.  She will follow-up in the office in 5 months or sooner if needed.  High risk medication use - Arava 20 mg 1 tablet by mouth daily, Plaquenil 200 mg 1 tablet daily Monday through Friday and half tab on Saturday and sundays.  CMP updated on 11/05/22.  CBC with differential updated today.  Her next lab work will be due in June and every 3 months to monitor for drug toxicity.  Standing orders for CBC and CMP remain in place. PLQ Eye Exam:06/01/2022 WNL @ [facility]  No recent or recurrent infections.  Advised patient to holding arava if she develops signs or symptoms of an infection and to resume once the infection has completely cleared.    - Plan: CBC with Differential/Platelet  Trigger little finger of right hand: Resolved.  Trigger finger, right ring finger: Resolved.  DDD (degenerative disc disease), cervical: C-spine has good range of motion with no discomfort at this time.  Osteopenia of multiple sites - DEXA on 02/18/18: The BMD measured at Femur Total Right is 0.700 g/cm2 with a T-score of -2.4.  Order placed by Dr. Susann Givens  Vitamin D deficiency: She is taking vitamin D 2000 units daily.  Other medical conditions are listed as follows:  Senile purpura  History of chronic pancreatitis  Hypertension associated with diabetes  History of peptic ulcer disease  Hyperlipidemia associated with type 2 diabetes mellitus  History of gastroesophageal reflux  (GERD)  History of hypothyroidism  Former smoker  Orders: Orders Placed This Encounter  Procedures   CBC with Differential/Platelet   No orders of the defined types were placed in this encounter.    Follow-Up Instructions: Return in about 5 months (around 05/06/2023) for Rheumatoid arthritis, DDD.   Gearldine Bienenstock, PA-C  Note - This record has been created using Dragon software.  Chart creation errors have been sought, but may not always  have been located. Such creation  errors do not reflect on  the standard of medical care.

## 2022-11-26 ENCOUNTER — Other Ambulatory Visit: Payer: Self-pay | Admitting: Physician Assistant

## 2022-11-26 DIAGNOSIS — M0600 Rheumatoid arthritis without rheumatoid factor, unspecified site: Secondary | ICD-10-CM

## 2022-11-27 DIAGNOSIS — I152 Hypertension secondary to endocrine disorders: Secondary | ICD-10-CM | POA: Diagnosis not present

## 2022-11-27 DIAGNOSIS — E1169 Type 2 diabetes mellitus with other specified complication: Secondary | ICD-10-CM | POA: Diagnosis not present

## 2022-11-27 DIAGNOSIS — E1159 Type 2 diabetes mellitus with other circulatory complications: Secondary | ICD-10-CM | POA: Diagnosis not present

## 2022-11-27 DIAGNOSIS — E785 Hyperlipidemia, unspecified: Secondary | ICD-10-CM | POA: Diagnosis not present

## 2022-12-04 ENCOUNTER — Ambulatory Visit: Payer: 59 | Attending: Physician Assistant | Admitting: Physician Assistant

## 2022-12-04 ENCOUNTER — Encounter: Payer: Self-pay | Admitting: Physician Assistant

## 2022-12-04 ENCOUNTER — Other Ambulatory Visit: Payer: Self-pay

## 2022-12-04 VITALS — BP 112/74 | HR 89 | Resp 14 | Ht 63.0 in | Wt 104.2 lb

## 2022-12-04 DIAGNOSIS — E785 Hyperlipidemia, unspecified: Secondary | ICD-10-CM

## 2022-12-04 DIAGNOSIS — M8589 Other specified disorders of bone density and structure, multiple sites: Secondary | ICD-10-CM | POA: Diagnosis not present

## 2022-12-04 DIAGNOSIS — Z8711 Personal history of peptic ulcer disease: Secondary | ICD-10-CM

## 2022-12-04 DIAGNOSIS — M5030 Other cervical disc degeneration, unspecified cervical region: Secondary | ICD-10-CM | POA: Diagnosis not present

## 2022-12-04 DIAGNOSIS — D692 Other nonthrombocytopenic purpura: Secondary | ICD-10-CM | POA: Diagnosis not present

## 2022-12-04 DIAGNOSIS — M65351 Trigger finger, right little finger: Secondary | ICD-10-CM | POA: Diagnosis not present

## 2022-12-04 DIAGNOSIS — Z87891 Personal history of nicotine dependence: Secondary | ICD-10-CM

## 2022-12-04 DIAGNOSIS — M0600 Rheumatoid arthritis without rheumatoid factor, unspecified site: Secondary | ICD-10-CM

## 2022-12-04 DIAGNOSIS — Z79899 Other long term (current) drug therapy: Secondary | ICD-10-CM

## 2022-12-04 DIAGNOSIS — E1169 Type 2 diabetes mellitus with other specified complication: Secondary | ICD-10-CM | POA: Diagnosis not present

## 2022-12-04 DIAGNOSIS — E1159 Type 2 diabetes mellitus with other circulatory complications: Secondary | ICD-10-CM

## 2022-12-04 DIAGNOSIS — M65341 Trigger finger, right ring finger: Secondary | ICD-10-CM

## 2022-12-04 DIAGNOSIS — Z8719 Personal history of other diseases of the digestive system: Secondary | ICD-10-CM

## 2022-12-04 DIAGNOSIS — Z8639 Personal history of other endocrine, nutritional and metabolic disease: Secondary | ICD-10-CM

## 2022-12-04 DIAGNOSIS — I152 Hypertension secondary to endocrine disorders: Secondary | ICD-10-CM

## 2022-12-04 DIAGNOSIS — E559 Vitamin D deficiency, unspecified: Secondary | ICD-10-CM | POA: Diagnosis not present

## 2022-12-04 LAB — CBC WITH DIFFERENTIAL/PLATELET
Absolute Monocytes: 1162 cells/uL — ABNORMAL HIGH (ref 200–950)
Basophils Absolute: 42 cells/uL (ref 0–200)
Basophils Relative: 0.5 %
Eosinophils Absolute: 282 cells/uL (ref 15–500)
Eosinophils Relative: 3.4 %
HCT: 35.4 % (ref 35.0–45.0)
Hemoglobin: 11.4 g/dL — ABNORMAL LOW (ref 11.7–15.5)
Lymphs Abs: 1884 cells/uL (ref 850–3900)
MCH: 27.9 pg (ref 27.0–33.0)
MCHC: 32.2 g/dL (ref 32.0–36.0)
MCV: 86.8 fL (ref 80.0–100.0)
MPV: 9.5 fL (ref 7.5–12.5)
Monocytes Relative: 14 %
Neutro Abs: 4930 cells/uL (ref 1500–7800)
Neutrophils Relative %: 59.4 %
Platelets: 329 10*3/uL (ref 140–400)
RBC: 4.08 10*6/uL (ref 3.80–5.10)
RDW: 13.4 % (ref 11.0–15.0)
Total Lymphocyte: 22.7 %
WBC: 8.3 10*3/uL (ref 3.8–10.8)

## 2022-12-04 MED ORDER — LEFLUNOMIDE 20 MG PO TABS: 20.0000 mg | ORAL_TABLET | Freq: Every day | ORAL | 0 refills | Status: DC

## 2022-12-04 MED ORDER — HYDROXYCHLOROQUINE SULFATE 200 MG PO TABS: ORAL_TABLET | ORAL | 0 refills | Status: DC

## 2022-12-04 NOTE — Patient Instructions (Signed)
Standing Labs We placed an order today for your standing lab work.   Please have your standing labs drawn at the end of June and every 3 months   Please have your labs drawn 2 weeks prior to your appointment so that the provider can discuss your lab results at your appointment, if possible.  Please note that you may see your imaging and lab results in MyChart before we have reviewed them. We will contact you once all results are reviewed. Please allow our office up to 72 hours to thoroughly review all of the results before contacting the office for clarification of your results.  WALK-IN LAB HOURS  Monday through Thursday from 8:00 am -12:30 pm and 1:00 pm-5:00 pm and Friday from 8:00 am-12:00 pm.  Patients with office visits requiring labs will be seen before walk-in labs.  You may encounter longer than normal wait times. Please allow additional time. Wait times may be shorter on  Monday and Thursday afternoons.  We do not book appointments for walk-in labs. We appreciate your patience and understanding with our staff.   Labs are drawn by [facility]. Please bring your co-pay at the time of your lab draw.  You may receive a bill from [facility] for your lab work.  Please note if you are on Hydroxychloroquine and and an order has been placed for a Hydroxychloroquine level,  you will need to have it drawn 4 hours or more after your last dose.  If you wish to have your labs drawn at another location, please call the office 24 hours in advance so we can fax the orders.  The office is located at [address], [address], [place], [place] [address]   If you have any questions regarding directions or hours of operation,  please call [phone].   As a reminder, please drink plenty of water prior to coming for your lab work. Thanks!  

## 2022-12-04 NOTE — Progress Notes (Signed)
Hemoglobin was borderline low at 11.4.  Absolute monocytes are borderline elevated.  Rest of CBC within normal limits.  We will continue to monitor lab work closely.

## 2022-12-04 NOTE — Progress Notes (Unsigned)
Please review pended refills for PLQ and Arava and send to [facility]. Thanks!

## 2022-12-13 ENCOUNTER — Other Ambulatory Visit: Payer: Self-pay | Admitting: Family Medicine

## 2022-12-13 DIAGNOSIS — E118 Type 2 diabetes mellitus with unspecified complications: Secondary | ICD-10-CM

## 2022-12-16 DIAGNOSIS — E118 Type 2 diabetes mellitus with unspecified complications: Secondary | ICD-10-CM | POA: Diagnosis not present

## 2022-12-17 ENCOUNTER — Telehealth: Payer: Self-pay

## 2022-12-17 NOTE — Telephone Encounter (Signed)
Patient left a message about her Hydroxychloroquine stating [facility] told her they did not get a prescription yet. After reviewing he chart, there was a prescription sent to [facility] on 12/04/2022. Attempted to contact the patient and left a message to call the office back.

## 2022-12-18 ENCOUNTER — Telehealth: Payer: Self-pay | Admitting: *Deleted

## 2022-12-18 NOTE — Telephone Encounter (Signed)
Patient called the office stating that she is having trouble getting her prescription of PLQ filled with [facility]. Advised patient I would reach out to [facility] and then let her know what was going on.   Spoke with pharmacist at [facility]. He states they have the prescription and need to make sure the doctor is okay with the patient splitting the tablet since the patient takes half a tablet on Saturday and Sunday. Advised him the doctor is aware and okay with it. He states they will get the prescription ready and put a rush on it.  Notified patient and she expressed understanding.

## 2023-01-15 ENCOUNTER — Other Ambulatory Visit: Payer: Self-pay | Admitting: Family Medicine

## 2023-01-15 DIAGNOSIS — E118 Type 2 diabetes mellitus with unspecified complications: Secondary | ICD-10-CM | POA: Diagnosis not present

## 2023-01-15 DIAGNOSIS — K8681 Exocrine pancreatic insufficiency: Secondary | ICD-10-CM

## 2023-01-17 ENCOUNTER — Encounter: Payer: 59 | Attending: Family Medicine | Admitting: Dietician

## 2023-01-17 ENCOUNTER — Encounter: Payer: Self-pay | Admitting: Dietician

## 2023-01-17 DIAGNOSIS — Z794 Long term (current) use of insulin: Secondary | ICD-10-CM | POA: Diagnosis not present

## 2023-01-17 DIAGNOSIS — E0869 Diabetes mellitus due to underlying condition with other specified complication: Secondary | ICD-10-CM | POA: Insufficient documentation

## 2023-01-17 NOTE — Patient Instructions (Addendum)
Add a protein to breakfast (Peanut butter almond butter, lean meat, nuts, egg, yogurt, or cheese).  This may help keep your blood glucose more stable.  If you are consistently low after you eat breakfast then you may need to decrease your Novolog to 2 units.  Continue to stay active.  Look for senior strength exercises on line.  Aim for small meals/snacks throughout the day.           30 grams carbohydrates with each meal and less than 15 grams for a snack. How do the meals/snack that you eat effect your blood glucose sugar.  It is normal for it to increase 40-60 points.  If your blood glucose is high before a meal or snack, choose fewer carbs such as just a protein and vegetables or a protein snack without carbs. Make your meals quality nutrition.   Lower carb snack options: Nuts Cheese Lower sugar greek yogurt such as less sugar Chobani (11 grams Carbs) or Two Good (4 grams carbs) 1 oz leftover meat Small portion of leftovers   Other things that affect your blood glucose include: Stress Pain Illness Lack of sleep Activity   Increase your vegetable intake particularly greens and salad as tolerated Add legumes (beans or lentils) daily. Eat a diet that is low in saturated fat. Choose whole grains most often If you develop diarrhea, decrease your fiber to a tolerated amount. Continue to take your pancreatic enzymes with each meal and snack and other medications as prescribed.

## 2023-01-17 NOTE — Progress Notes (Signed)
Diabetes Self-Management Education  Visit Type: Follow-up  Appt. Start Time: 1000 Appt. End Time: 1030  01/17/2023  [name], identified by name and date of birth, is a 82 y.o. female with a diagnosis of Diabetes: Type 2.   ASSESSMENT Patient is here today alone.  She was last seen by this RD on 11/15/2022.  Noted 5% lows below 70.  She states that she was taking her long acting insulin before bed and this has improved since moving this to 8 pm and this has helped.  Low this am after banana for breakfast -68.  4 oz juice provided States that she feels well overall. Walking some but plans to walk more when her grandchildren get out of school. Yoga on days she is not able to walk. Not waking as frequently.  Referral:  Type 2 Diabetes History includes Type 2 diabetes, GER, dyslipidemia,HTN, CKD, hypothyroid, osteopenia, vitamin D deficiency, s/p Whipple procedure 08/1996 due to chronic pancreatitis, exocrine pancreatic insufficiency (diarrhea resolved with pancreatic enzyme addition with all meals and snacks) Pneumonia in November 2019 and since has had decreased taste.  She lost to 82 lbs. Medications include Toujeo 10 units once daily 8, Novolog 3 units before each meal, Metformin XR, pravastatin, lisinopril, Zenpep with meals and snacks, CoQ10, calcium, magnesium, zinc, vitamin B-complex, MVI, synthroid, vitamin D3, vitamin E Labs noted to include A1C 9.2% 11/05/2022, 8.7% 08/10/2022, eGFR 51 on 11/05/2022 CGM:  Libre 2    CGM Results from download:  06/28/2022 08/28/2022 x7 11/12/2022 x7 01/17/23 x7  % Time CGM active:   89 %   (Goal >70%) 88 84 92  Average glucose:   240 mg/dL for the past 14 days 540 x 7 days and 304 for the past 14 days. 212 x 7 days 147 x 7 days  Glucose management indicator:         Time in range (70-180 mg/dL):   21 %   (Goal >98%) 12% 42% 69  Time High (181-250 mg/dL):   25 %   (Goal < 11%) 17% 22% 20  Time Very High (>250 mg/dL):    50 %   (Goal < 5%) 71%  36% 6  Time Low (54-69 mg/dL):   4 %   (Goal <9%) 0 0 5  Time Very Low (<54 mg/dL):   0 %   (Goal <1%) 0 0  0    Weight: 63" 107 lbs 01/17/2023 104 lbs 11/15/2022 100.7 lbs 08/28/2021 101 lbs 06/28/22 UBW 97-102 lbs She is happy with current weight and states that 110 lbs would be the highest that she wants. 140 lbs 2011 when diagnosed with diabetes   Patient lives with her daughter who is a Engineer, civil (consulting) and her family.  She has a camper on the property and spends much of her time there.  She does much of her cooking and shopping.   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 Walks daily, stretching. Avoids bread, limits potatoes and other starches. Stress:  stress improved.  She picks up grandchild from school. Sleep:  improved sleep as she had to put her dog down in February Exercise:  stretches, walking in yard There were no vitals taken for this visit. There is no height or weight on file to calculate BMI.   Diabetes Self-Management Education - 01/17/23 1600       Visit Information   Visit Type Follow-up      Initial Visit   Diabetes Type  Type 2      Psychosocial Assessment   Patient Belief/Attitude about Diabetes Motivated to manage diabetes    Self-care barriers None    Self-management support Doctor's office    Other persons present Patient    Patient Concerns Nutrition/Meal planning;Monitoring;Glycemic Control    Preferred Learning Style No preference indicated    Learning Readiness Ready      Pre-Education Assessment   Patient understands the diabetes disease and treatment process. Demonstrates understanding / competency    Patient understands incorporating nutritional management into lifestyle. Comprehends key points    Patient undertands incorporating physical activity into lifestyle. Demonstrates understanding / competency    Patient understands using medications safely. Demonstrates understanding / competency    Patient understands monitoring  blood glucose, interpreting and using results Demonstrates understanding / competency    Patient understands prevention, detection, and treatment of acute complications. Demonstrates understanding / competency    Patient understands prevention, detection, and treatment of chronic complications. Compreheands key points    Patient understands how to develop strategies to address psychosocial issues. Demonstrates understanding / competency    Patient understands how to develop strategies to promote health/change behavior. Comprehends key points      Complications   Last HgB A1C per patient/outside source 9.2 %   12/06/2022   How often do you check your blood sugar? > 4 times/day    Fasting Blood glucose range (mg/dL) 16-109;604-540    Postprandial Blood glucose range (mg/dL) 981-191;478-295;62-130    Number of hypoglycemic episodes per month 7    Can you tell when your blood sugar is low? Yes    What do you do if your blood sugar is low? glucose tabs    Number of hyperglycemic episodes ( >200mg /dL): Occasional    Can you tell when your blood sugar is high? Yes      Dietary Intake   Breakfast large banana, 2 cups unsweetened hot tea with stevia    Snack (morning) 2 yogurt (2good or chobani sugar free and berries    Lunch 1/2 knor side with broccoli and cauliflower and extra cheese    Snack (afternoon) nuts    Dinner leftover chicken, vegetables    Snack (evening) smart popcorn    Beverage(s) water, tea with stevia, sugar free gingerale      Activity / Exercise   Activity / Exercise Type Light (walking / raking leaves)    How many days per week do you exercise? 7    How many minutes per day do you exercise? 15    Total minutes per week of exercise 105      Patient Education   Previous Diabetes Education Yes (please comment)    Healthy Eating Meal options for control of blood glucose level and chronic complications.    Being Active Role of exercise on diabetes management, blood pressure  control and cardiac health.    Medications Reviewed patients medication for diabetes, action, purpose, timing of dose and side effects.    Monitoring Taught/evaluated CGM (comment)      Individualized Goals (developed by patient)   Nutrition General guidelines for healthy choices and portions discussed    Physical Activity Exercise 5-7 days per week;30 minutes per day    Medications take my medication as prescribed    Monitoring  Consistenly use CGM    Problem Solving Sleep Pattern;Eating Pattern    Reducing Risk examine blood glucose patterns;do foot checks daily    Health Coping Ask for help with psychological, social,  or emotional issues      Patient Self-Evaluation of Goals - Patient rates self as meeting previously set goals (% of time)   Nutrition >75% (most of the time)    Physical Activity 25 - 50% (sometimes)    Medications >75% (most of the time)    Monitoring >75% (most of the time)    Problem Solving and behavior change strategies  50 - 75 % (half of the time)    Reducing Risk (treating acute and chronic complications) 50 - 75 % (half of the time)    Health Coping 50 - 75 % (half of the time)      Post-Education Assessment   Patient understands the diabetes disease and treatment process. Demonstrates understanding / competency    Patient understands incorporating nutritional management into lifestyle. Comprehends key points    Patient undertands incorporating physical activity into lifestyle. Demonstrates understanding / competency    Patient understands using medications safely. Demonstrates understanding / competency    Patient understands monitoring blood glucose, interpreting and using results Comprehends key points    Patient understands prevention, detection, and treatment of acute complications. Demonstrates understanding / competency    Patient understands prevention, detection, and treatment of chronic complications. Demonstrates understanding / competency     Patient understands how to develop strategies to address psychosocial issues. Demonstrates understanding / competency    Patient understands how to develop strategies to promote health/change behavior. Comprehends key points      Outcomes   Expected Outcomes Demonstrated interest in learning. Expect positive outcomes    Future DMSE 3-4 months    Program Status Not Completed      Subsequent Visit   Since your last visit have you experienced any weight changes? Gain    Weight Gain (lbs) 3             Individualized Plan for Diabetes Self-Management Training:   Learning Objective:  Patient will have a greater understanding of diabetes self-management. Patient education plan is to attend individual and/or group sessions per assessed needs and concerns.   Plan:   Patient Instructions  Add a protein to breakfast (Peanut butter almond butter, lean meat, nuts, egg, yogurt, or cheese).  This may help keep your blood glucose more stable.  If you are consistently low after you eat breakfast then you may need to decrease your Novolog to 2 units.  Continue to stay active.  Look for senior strength exercises on line.  Aim for small meals/snacks throughout the day.           30 grams carbohydrates with each meal and less than 15 grams for a snack. How do the meals/snack that you eat effect your blood glucose sugar.  It is normal for it to increase 40-60 points.  If your blood glucose is high before a meal or snack, choose fewer carbs such as just a protein and vegetables or a protein snack without carbs. Make your meals quality nutrition.   Lower carb snack options: Nuts Cheese Lower sugar greek yogurt such as less sugar Chobani (11 grams Carbs) or Two Good (4 grams carbs) 1 oz leftover meat Small portion of leftovers   Other things that affect your blood glucose include: Stress Pain Illness Lack of sleep Activity   Increase your vegetable intake particularly greens and salad as  tolerated Add legumes (beans or lentils) daily. Eat a diet that is low in saturated fat. Choose whole grains most often If you develop diarrhea, decrease your fiber  to a tolerated amount. Continue to take your pancreatic enzymes with each meal and snack and other medications as prescribed.   Expected Outcomes:  Demonstrated interest in learning. Expect positive outcomes  Education material provided:   If problems or questions, patient to contact team via:  Phone  Future DSME appointment: 3-4 months

## 2023-01-22 DIAGNOSIS — R140 Abdominal distension (gaseous): Secondary | ICD-10-CM | POA: Diagnosis not present

## 2023-01-29 DIAGNOSIS — Z794 Long term (current) use of insulin: Secondary | ICD-10-CM | POA: Diagnosis not present

## 2023-02-04 ENCOUNTER — Other Ambulatory Visit: Payer: Self-pay | Admitting: Physician Assistant

## 2023-02-04 DIAGNOSIS — M0600 Rheumatoid arthritis without rheumatoid factor, unspecified site: Secondary | ICD-10-CM

## 2023-02-05 ENCOUNTER — Encounter (HOSPITAL_BASED_OUTPATIENT_CLINIC_OR_DEPARTMENT_OTHER): Payer: Self-pay | Admitting: Obstetrics and Gynecology

## 2023-02-05 ENCOUNTER — Other Ambulatory Visit: Payer: Self-pay

## 2023-02-05 NOTE — Progress Notes (Addendum)
Spoke w/ via phone for pre-op interview---pt Lab needs dos----  I stat   , surgery orders req dr Elizbeth Squires epic ib          Lab results------EKG 05-11-2022 in epic COVID test -----patient states asymptomatic no test needed Arrive at -------850  am 02-14-2023 NPO after MN NO Solid Food.  Clear liquids from MN until---750 am Med rec completed Medications to take morning of surgery -----Levothyroxine, Omeprazole, Pravastatin Diabetic medication -----take 1/2 dose pm toujeo insulin day before surgery (take 5 units), no diabetic meds morning of surgery Patient instructed no nail polish to be worn day of surgery Patient instructed to bring photo id and insurance card day of surgery Patient aware to have Driver (ride ) / caregiver    Daughter for 24 hours after surgery  Patient Special Instructions -----none Pre-Op special Instructions -----pt will have free style libre glucose monitor on her arm (not sure which arm ) Patient verbalized understanding of instructions that were given at this phone interview. Patient denies shortness of breath, chest pain, fever, cough at this phone interview.  Medical clearance note sheri beal pa 02-01-2023 on chart for 02-14-2023 surgery

## 2023-02-11 ENCOUNTER — Telehealth: Payer: Self-pay | Admitting: Physician Assistant

## 2023-02-11 NOTE — Telephone Encounter (Signed)
Sent Medical Records over to [facility] for Doralee's last visitwith Dr.Lalonde.

## 2023-02-13 NOTE — H&P (Signed)
Deborah Travis is an 82 y.o. female presenting for scheduled procedure  Pertinent Gynecological History: Menses: post-menopausal Bleeding: none - abnormal dishcarge - reen/yellow Contraception: post menopausal status DES exposure: unknown Blood transfusions: none Sexually transmitted diseases: no past history Previous GYN Procedures:  none   Last mammogram: 05/14/13 Date: WNL Last pap: normal Date: unsure OB History: G2, P2002   Menstrual History: Menarche age: PMF No LMP recorded (lmp unknown). Patient is postmenopausal.    Past Medical History:  Diagnosis Date   Chronic kidney disease    RENAL STONE   Chronic pancreatitis (HCC)    Complication of anesthesia    slow to awaken   Diabetes mellitus Type 2    Duodenal ulcer    Dyslipidemia    GERD (gastroesophageal reflux disease)    Hypertension    Osteopenia    Thyroid disease    HYPOTHYROID   Vitamin D deficiency    Wears hearing aid in both ears     Past Surgical History:  Procedure Laterality Date   APPENDECTOMY     yrs ago   CATARACT EXTRACTION Right 08/27/2012   CHOLECYSTECTOMY     yrs ago   TONSILLECTOMY AND ADENOIDECTOMY     as child   TUBAL LIGATION     age 49   WHIPPLE PROCEDURE     1998 or 40    Family History  Problem Relation Age of Onset   Stroke Mother    Heart disease Father    Healthy Daughter    Healthy Daughter    Emphysema Daughter    Diabetes Daughter    Hernia Daughter     Social History:  reports that she has been smoking cigarettes. She has been smoking an average of .4 packs per day. She has been exposed to tobacco smoke. She has never used smokeless tobacco. She reports that she does not drink alcohol and does not use drugs.  Allergies:  Allergies  Allergen Reactions   Levofloxacin     Muscle spasms and tightness    Codeine Itching    No medications prior to admission.    Review of Systems  Constitutional:  Negative for chills and fever.  Respiratory:  Negative  for shortness of breath.   Cardiovascular:  Negative for chest pain, palpitations and leg swelling.  Gastrointestinal:  Negative for abdominal pain, nausea and vomiting.  Genitourinary:  Positive for vaginal discharge.  Neurological:  Negative for dizziness, weakness and headaches.  Psychiatric/Behavioral:  Negative for suicidal ideas.     Height 5\' 3"  (1.6 m), weight 49.9 kg. Physical Exam WUJ:WJXB, WNL CV: WNL Abd: Bsx2, NTTP GU deferred Psych/Neuro WNL No results found for this or any previous visit (from the past 24 hour(s)).  No results found.  Assessment/Plan: 14NW [id number] PMF presenting for D&C for presumed hemato vs pyometria. Reviewed need for cervical dilation and drainage/tesitng of fluid. Has received medical clearance in the setting of chronic T2DM and chronic tobacco use. Risks included but were not limited to bleeidng, infections, injury to the vulva, vagina or cerivx, or uterine perforation. If concern for latter, may proceed with diagnostic laparoscopy for evaluation of injury which may require surgical repair. Patient understands and is amenable.   Valerie Roys Braylon Grenda 02/13/2023, 11:46 PM

## 2023-02-14 ENCOUNTER — Other Ambulatory Visit: Payer: Self-pay | Admitting: Physician Assistant

## 2023-02-14 ENCOUNTER — Ambulatory Visit (HOSPITAL_BASED_OUTPATIENT_CLINIC_OR_DEPARTMENT_OTHER): Payer: 59 | Admitting: Anesthesiology

## 2023-02-14 ENCOUNTER — Ambulatory Visit (HOSPITAL_BASED_OUTPATIENT_CLINIC_OR_DEPARTMENT_OTHER)
Admission: RE | Admit: 2023-02-14 | Discharge: 2023-02-14 | Disposition: A | Discharge status: Home / Self Care | Payer: 59 | Source: Ambulatory Visit | Attending: Obstetrics and Gynecology | Admitting: Obstetrics and Gynecology

## 2023-02-14 ENCOUNTER — Encounter (HOSPITAL_BASED_OUTPATIENT_CLINIC_OR_DEPARTMENT_OTHER)
Admission: RE | Disposition: A | Discharge status: Home / Self Care | Payer: Self-pay | Source: Ambulatory Visit | Attending: Obstetrics and Gynecology

## 2023-02-14 ENCOUNTER — Encounter (HOSPITAL_BASED_OUTPATIENT_CLINIC_OR_DEPARTMENT_OTHER): Payer: Self-pay | Admitting: Obstetrics and Gynecology

## 2023-02-14 ENCOUNTER — Other Ambulatory Visit: Payer: Self-pay

## 2023-02-14 DIAGNOSIS — N719 Inflammatory disease of uterus, unspecified: Secondary | ICD-10-CM | POA: Diagnosis not present

## 2023-02-14 DIAGNOSIS — Z79899 Other long term (current) drug therapy: Secondary | ICD-10-CM | POA: Diagnosis not present

## 2023-02-14 DIAGNOSIS — F17200 Nicotine dependence, unspecified, uncomplicated: Secondary | ICD-10-CM | POA: Diagnosis not present

## 2023-02-14 DIAGNOSIS — M0600 Rheumatoid arthritis without rheumatoid factor, unspecified site: Secondary | ICD-10-CM

## 2023-02-14 DIAGNOSIS — F17210 Nicotine dependence, cigarettes, uncomplicated: Secondary | ICD-10-CM

## 2023-02-14 DIAGNOSIS — I739 Peripheral vascular disease, unspecified: Secondary | ICD-10-CM | POA: Insufficient documentation

## 2023-02-14 DIAGNOSIS — E039 Hypothyroidism, unspecified: Secondary | ICD-10-CM | POA: Diagnosis not present

## 2023-02-14 DIAGNOSIS — I10 Essential (primary) hypertension: Secondary | ICD-10-CM | POA: Diagnosis not present

## 2023-02-14 DIAGNOSIS — E1151 Type 2 diabetes mellitus with diabetic peripheral angiopathy without gangrene: Secondary | ICD-10-CM | POA: Insufficient documentation

## 2023-02-14 DIAGNOSIS — Z01818 Encounter for other preprocedural examination: Secondary | ICD-10-CM

## 2023-02-14 HISTORY — PX: HYSTEROSCOPY WITH D & C: SHX1775

## 2023-02-14 HISTORY — DX: Other complications of anesthesia, initial encounter: T88.59XA

## 2023-02-14 HISTORY — DX: Presence of external hearing-aid: Z97.4

## 2023-02-14 LAB — POCT I-STAT, CHEM 8
BUN: 28 mg/dL — ABNORMAL HIGH (ref 8–23)
Calcium, Ion: 1.15 mmol/L (ref 1.15–1.40)
Chloride: 102 mmol/L (ref 98–111)
Creatinine, Ser: 1 mg/dL (ref 0.44–1.00)
Glucose, Bld: 158 mg/dL — ABNORMAL HIGH (ref 70–99)
HCT: 38 % (ref 36.0–46.0)
Hemoglobin: 12.9 g/dL (ref 12.0–15.0)
Potassium: 4.4 mmol/L (ref 3.5–5.1)
Sodium: 138 mmol/L (ref 135–145)
TCO2: 27 mmol/L (ref 22–32)

## 2023-02-14 LAB — TYPE AND SCREEN
ABO/RH(D): A POS
Antibody Screen: NEGATIVE

## 2023-02-14 LAB — GLUCOSE, CAPILLARY: Glucose-Capillary: 139 mg/dL — ABNORMAL HIGH (ref 70–99)

## 2023-02-14 LAB — AEROBIC/ANAEROBIC CULTURE W GRAM STAIN (SURGICAL/DEEP WOUND)

## 2023-02-14 SURGERY — DILATATION AND CURETTAGE /HYSTEROSCOPY
Anesthesia: General

## 2023-02-14 MED ORDER — LACTATED RINGERS IV SOLN: INTRAVENOUS | Status: DC

## 2023-02-14 MED ORDER — OXYCODONE HCL 5 MG/5ML PO SOLN: 5.0000 mg | Freq: Once | ORAL | Status: DC | PRN

## 2023-02-14 MED ORDER — PHENYLEPHRINE 80 MCG/ML (10ML) SYRINGE FOR IV PUSH (FOR BLOOD PRESSURE SUPPORT)
PREFILLED_SYRINGE | INTRAVENOUS | Status: AC
  Filled 2023-02-14: qty 10

## 2023-02-14 MED ORDER — CEFAZOLIN SODIUM-DEXTROSE 2-4 GM/100ML-% IV SOLN
2.0000 g | INTRAVENOUS | Status: AC
  Administered 2023-02-14: 2 g via INTRAVENOUS

## 2023-02-14 MED ORDER — ONDANSETRON HCL 4 MG/2ML IJ SOLN
INTRAMUSCULAR | Status: AC
  Filled 2023-02-14: qty 2

## 2023-02-14 MED ORDER — PROMETHAZINE HCL 25 MG/ML IJ SOLN: 6.2500 mg | INTRAMUSCULAR | Status: DC | PRN

## 2023-02-14 MED ORDER — FENTANYL CITRATE (PF) 100 MCG/2ML IJ SOLN
INTRAMUSCULAR | Status: DC | PRN
  Administered 2023-02-14 (×4): 25 ug via INTRAVENOUS

## 2023-02-14 MED ORDER — CEFAZOLIN SODIUM-DEXTROSE 2-4 GM/100ML-% IV SOLN
INTRAVENOUS | Status: AC
  Filled 2023-02-14: qty 100

## 2023-02-14 MED ORDER — AMISULPRIDE (ANTIEMETIC) 5 MG/2ML IV SOLN: 10.0000 mg | Freq: Once | INTRAVENOUS | Status: DC | PRN

## 2023-02-14 MED ORDER — LIDOCAINE HCL (PF) 2 % IJ SOLN
INTRAMUSCULAR | Status: AC
  Filled 2023-02-14: qty 5

## 2023-02-14 MED ORDER — PHENYLEPHRINE HCL (PRESSORS) 10 MG/ML IV SOLN
INTRAVENOUS | Status: DC | PRN
  Administered 2023-02-14 (×5): 80 ug via INTRAVENOUS

## 2023-02-14 MED ORDER — 0.9 % SODIUM CHLORIDE (POUR BTL) OPTIME
TOPICAL | Status: DC | PRN
  Administered 2023-02-14: 500 mL

## 2023-02-14 MED ORDER — POVIDONE-IODINE 10 % EX SWAB: 2.0000 | Freq: Once | CUTANEOUS | Status: DC

## 2023-02-14 MED ORDER — PROPOFOL 10 MG/ML IV BOLUS
INTRAVENOUS | Status: DC | PRN
  Administered 2023-02-14: 150 mg via INTRAVENOUS

## 2023-02-14 MED ORDER — IBUPROFEN 400 MG PO TABS: 400.0000 mg | ORAL_TABLET | Freq: Four times a day (QID) | ORAL | 0 refills | Status: DC | PRN

## 2023-02-14 MED ORDER — HYDROMORPHONE HCL 1 MG/ML IJ SOLN
INTRAMUSCULAR | Status: AC
  Filled 2023-02-14: qty 1

## 2023-02-14 MED ORDER — LIDOCAINE HCL (CARDIAC) PF 100 MG/5ML IV SOSY
PREFILLED_SYRINGE | INTRAVENOUS | Status: DC | PRN
  Administered 2023-02-14: 40 mg via INTRAVENOUS

## 2023-02-14 MED ORDER — PROPOFOL 10 MG/ML IV BOLUS
INTRAVENOUS | Status: AC
  Filled 2023-02-14: qty 20

## 2023-02-14 MED ORDER — HYDROMORPHONE HCL 1 MG/ML IJ SOLN
0.2500 mg | INTRAMUSCULAR | Status: DC | PRN
  Administered 2023-02-14: 0.25 mg via INTRAVENOUS

## 2023-02-14 MED ORDER — OXYCODONE HCL 5 MG PO TABS: 5.0000 mg | ORAL_TABLET | Freq: Once | ORAL | Status: DC | PRN

## 2023-02-14 MED ORDER — ONDANSETRON HCL 4 MG/2ML IJ SOLN
INTRAMUSCULAR | Status: DC | PRN
  Administered 2023-02-14: 4 mg via INTRAVENOUS

## 2023-02-14 MED ORDER — FENTANYL CITRATE (PF) 100 MCG/2ML IJ SOLN
INTRAMUSCULAR | Status: AC
  Filled 2023-02-14: qty 2

## 2023-02-14 MED ORDER — LIDOCAINE HCL 1 % IJ SOLN
INTRAMUSCULAR | Status: DC | PRN
  Administered 2023-02-14: 5 mL

## 2023-02-14 SURGICAL SUPPLY — 22 items
CNTNR URN SCR LID CUP LEK RST (MISCELLANEOUS) IMPLANT
CONT SPEC 4OZ STRL OR WHT (MISCELLANEOUS) ×1
CURETTE PIPELLE ENDOMTRL SUCTN (MISCELLANEOUS) IMPLANT
GAUZE 4X4 16PLY ~~LOC~~+RFID DBL (SPONGE) ×2 IMPLANT
GLOVE BIO SURGEON STRL SZ 6 (GLOVE) IMPLANT
GLOVE BIOGEL PI IND STRL 6 (GLOVE) IMPLANT
GLOVE BIOGEL PI IND STRL 6.5 (GLOVE) ×2 IMPLANT
GLOVE BIOGEL PI IND STRL 7.0 (GLOVE) ×4 IMPLANT
GLOVE ECLIPSE 6.5 STRL STRAW (GLOVE) ×2 IMPLANT
GLOVE SURG SS PI 6.5 STRL IVOR (GLOVE) IMPLANT
GOWN STRL REUS W/TWL LRG LVL3 (GOWN DISPOSABLE) ×4 IMPLANT
KIT PROCEDURE FLUENT (KITS) ×2 IMPLANT
KIT TURNOVER CYSTO (KITS) ×2 IMPLANT
PACK VAGINAL MINOR WOMEN LF (CUSTOM PROCEDURE TRAY) ×2 IMPLANT
PAD OB MATERNITY 4.3X12.25 (PERSONAL CARE ITEMS) ×2 IMPLANT
PIPELLE ENDOMETRIAL SUCTION CU (MISCELLANEOUS) ×1
SLEEVE SCD COMPRESS KNEE MED (STOCKING) ×2 IMPLANT
SOL PREP POV-IOD 4OZ 10% (MISCELLANEOUS) IMPLANT
SPIKE FLUID TRANSFER (MISCELLANEOUS) IMPLANT
SWAB COLLECTION DEVICE MRSA (MISCELLANEOUS) IMPLANT
SWAB CULTURE ESWAB REG 1ML (MISCELLANEOUS) IMPLANT
TOWEL OR 17X24 6PK STRL BLUE (TOWEL DISPOSABLE) ×2 IMPLANT

## 2023-02-14 NOTE — Anesthesia Postprocedure Evaluation (Signed)
Anesthesia Post Note  Patient: Deborah Travis  Procedure(s) Performed: DILATATION AND CURETTAGE     Patient location during evaluation: PACU Anesthesia Type: General Level of consciousness: awake and alert Pain management: pain level controlled Vital Signs Assessment: post-procedure vital signs reviewed and stable Respiratory status: spontaneous breathing, nonlabored ventilation and respiratory function stable Cardiovascular status: blood pressure returned to baseline and stable Postop Assessment: no apparent nausea or vomiting Anesthetic complications: no   No notable events documented.  Last Vitals:  Vitals:   02/14/23 1045 02/14/23 1050  BP: 119/75   Pulse: 81 82  Resp: 20 15  Temp:  (!) 36.3 C  SpO2: 92% 96%    Last Pain:  Vitals:   02/14/23 1030  TempSrc:   PainSc: 5                  Lowella Curb

## 2023-02-14 NOTE — Telephone Encounter (Signed)
Last Fill: 12/04/2022  Eye exam: 06/01/2022 WNL    Labs: 12/04/2022 Hemoglobin was borderline low at 11.4.  Absolute monocytes are borderline elevated.  Rest of CBC within normal limits. CMP 11/05/2022  Next Visit: 05/21/2023  Last Visit: 12/04/2022  YN:[id number] rheumatoid arthritis   Current Dose per office note 12/04/2022: Plaquenil 200 mg 1 tablet daily Monday through Friday and half tab on Saturday and sundays.   Okay to refill Plaquenil?

## 2023-02-14 NOTE — Discharge Instructions (Signed)

## 2023-02-14 NOTE — Transfer of Care (Signed)
Immediate Anesthesia Transfer of Care Note  Patient: Deborah Travis  Procedure(s) Performed: Procedure(s) (LRB): DILATATION AND CURETTAGE (N/A)  Patient Location: PACU  Anesthesia Type: General  Level of Consciousness: awake, sedated, patient cooperative and responds to stimulation  Airway & Oxygen Therapy: Patient Spontanous Breathing and Patient connected to [place] oxygen  Post-op Assessment: Report given to PACU RN, Post -op Vital signs reviewed and stable and Patient moving all extremities  Post vital signs: Reviewed and stable  Complications: No apparent anesthesia complications

## 2023-02-14 NOTE — Interval H&P Note (Signed)
History and Physical Interval Note:  02/14/2023 8:52 AM  Deborah Travis  has presented today for surgery, with the diagnosis of hematometra.  The various methods of treatment have been discussed with the patient and family. After consideration of risks, benefits and other options for treatment, the patient has consented to  Procedure(s): DILATATION AND CURETTAGE, FRACTIONAL as a surgical intervention.  The patient's history has been reviewed, patient examined, no change in status, stable for surgery.  I have reviewed the patient's chart and labs.  Questions were answered to the patient's satisfaction.  Still having a scant amount of green-yellow discharge however this improved slightly after administration of IM Rocephin in office one week ago. Advised we will culture and send fluid off for cytology as well as gentle endometrial curettings given postmenopausal pyometra. Denies fevers, chills. All questions answered   Valerie Roys Rayshun Kandler

## 2023-02-14 NOTE — Op Note (Signed)
02/14/23   Surgeon: Ellison Hughs, MD Preoperative Diagnoses: Postmenopausal pyometra Postoperative Diagnoses: same as above   Procedures performed:  1) Fractional dilation and curettage   IVF: 300cc LR EBL: <10cc UOP: voided prior to procedure   Anesthesia: LMA  Pathology: anaerobic/aerobic cultures and cytology on uterine fluid plus endometrial curettings to pathology  Findings: External genitalia with mild vulvar and vaginal atrophy consistent with postmenopausal status. No evidence of lesion or bleeding within vaginal vault. Cervix appears multiparous but somewhat flush against posterior vagina. Anteverted, slightly levo-rotated uterus on BME, bilateral adnexa WNL. Fluid eventually drained from uterus approx 25cc green-yellow, no strong odor, somewhat purulent.   RBA:  The patient was informed of the risks and benefits of a dilation and curettage. Risks included but were not limited to bleeidng, infections, injury to the vulva, vagina or cerivx, or uterine perforation. If concern for latter, may proceed with diagnostic laparoscopy for evaluation of injury which may require surgical repair. Patient understands and is amenable.      Operative details: Patient was taken to operating room where general anesthesia via LMA was established. Placed in dorsal lithotomy with appropriate padded points. No antibiotics given preop via ACOG recommendations. Time out was performed after vaginal prep was carried out with Betadine. Tenaculum then placed on anterior cervical lip. Pratt dilators used (17) to dilate internal os. Gentle traction used to dilate internal os. Eventually, green yellow fluid began draining. Culture swabs were collected at this time. Using an endometrial biopsy pipelle with multiple passes, approx 25cc of fluid as noted above was drained from uterus. Care was taken to go slowly to avoid possible perforation and increased pressure that could possibly extrude contents through  fallopian tubes. After majority of fluid cleared, gentle D&C carried out in clockwise fashion until gritty texture noted throughout. Tenaculum removed. Bleeding controlled with pressure. All instruments removed from vagina.    Patient tolerated procedure well. All counts correct at end of procedure

## 2023-02-14 NOTE — Anesthesia Procedure Notes (Signed)
Procedure Name: LMA Insertion Date/Time: 02/14/2023 9:34 AM  Performed by: Jessica Priest, CRNAPre-anesthesia Checklist: Patient identified, Emergency Drugs available, Suction available, Patient being monitored and Timeout performed Patient Re-evaluated:Patient Re-evaluated prior to induction Oxygen Delivery Method: Circle system utilized Preoxygenation: Pre-oxygenation with 100% oxygen Induction Type: IV induction Ventilation: Mask ventilation without difficulty LMA: LMA inserted LMA Size: 3.0 Number of attempts: 1 Airway Equipment and Method: Bite block Placement Confirmation: positive ETCO2, breath sounds checked- equal and bilateral and CO2 detector Tube secured with: Tape Dental Injury: Teeth and Oropharynx as per pre-operative assessment

## 2023-02-14 NOTE — Anesthesia Preprocedure Evaluation (Signed)
Anesthesia Evaluation  Patient identified by MRN, date of birth, ID band Patient awake    Reviewed: Allergy & Precautions, H&P , NPO status , Patient's Chart, lab work & pertinent test results  Airway Mallampati: II  TM Distance: >3 FB Neck ROM: Full    Dental no notable dental hx.    Pulmonary neg pulmonary ROS, Current Smoker and Patient abstained from smoking.   Pulmonary exam normal breath sounds clear to auscultation       Cardiovascular hypertension, Pt. on medications + Peripheral Vascular Disease  Normal cardiovascular exam Rhythm:Regular Rate:Normal     Neuro/Psych negative neurological ROS  negative psych ROS   GI/Hepatic Neg liver ROS,GERD  ,,  Endo/Other  diabetes, Type 2Hypothyroidism    Renal/GU Renal InsufficiencyRenal disease  negative genitourinary   Musculoskeletal  (+) Arthritis , Osteoarthritis,    Abdominal   Peds negative pediatric ROS (+)  Hematology negative hematology ROS (+)   Anesthesia Other Findings   Reproductive/Obstetrics negative OB ROS                             Anesthesia Physical Anesthesia Plan  ASA: 3  Anesthesia Plan: General   Post-op Pain Management: Toradol IV (intra-op)*   Induction: Intravenous  PONV Risk Score and Plan: 2 and Ondansetron, Midazolam and Treatment may vary due to age or medical condition  Airway Management Planned: LMA  Additional Equipment:   Intra-op Plan:   Post-operative Plan: Extubation in OR  Informed Consent: I have reviewed the patients History and Physical, chart, labs and discussed the procedure including the risks, benefits and alternatives for the proposed anesthesia with the patient or authorized representative who has indicated his/her understanding and acceptance.     Dental advisory given  Plan Discussed with: CRNA  Anesthesia Plan Comments:        Anesthesia Quick Evaluation

## 2023-02-15 ENCOUNTER — Encounter (HOSPITAL_BASED_OUTPATIENT_CLINIC_OR_DEPARTMENT_OTHER): Payer: Self-pay | Admitting: Obstetrics and Gynecology

## 2023-02-15 DIAGNOSIS — E118 Type 2 diabetes mellitus with unspecified complications: Secondary | ICD-10-CM | POA: Diagnosis not present

## 2023-02-15 LAB — AEROBIC/ANAEROBIC CULTURE W GRAM STAIN (SURGICAL/DEEP WOUND)

## 2023-02-17 LAB — SURGICAL PATHOLOGY

## 2023-02-18 LAB — CYTOLOGY - NON PAP

## 2023-02-19 LAB — AEROBIC/ANAEROBIC CULTURE W GRAM STAIN (SURGICAL/DEEP WOUND)

## 2023-02-22 ENCOUNTER — Encounter: Payer: Self-pay | Admitting: Obstetrics and Gynecology

## 2023-03-05 ENCOUNTER — Other Ambulatory Visit: Payer: Self-pay | Admitting: Obstetrics and Gynecology

## 2023-03-05 DIAGNOSIS — N710 Acute inflammatory disease of uterus: Secondary | ICD-10-CM

## 2023-03-06 ENCOUNTER — Ambulatory Visit
Admission: RE | Admit: 2023-03-06 | Discharge: 2023-03-06 | Disposition: A | Discharge status: Home / Self Care | Payer: 59 | Source: Ambulatory Visit | Attending: Obstetrics and Gynecology | Admitting: Obstetrics and Gynecology

## 2023-03-06 DIAGNOSIS — N710 Acute inflammatory disease of uterus: Secondary | ICD-10-CM

## 2023-03-06 DIAGNOSIS — T8149XA Infection following a procedure, other surgical site, initial encounter: Secondary | ICD-10-CM | POA: Diagnosis not present

## 2023-03-06 DIAGNOSIS — I700 Atherosclerosis of aorta: Secondary | ICD-10-CM | POA: Diagnosis not present

## 2023-03-06 DIAGNOSIS — K859 Acute pancreatitis without necrosis or infection, unspecified: Secondary | ICD-10-CM | POA: Diagnosis not present

## 2023-03-06 MED ORDER — IOPAMIDOL (ISOVUE-300) INJECTION 61%
100.0000 mL | Freq: Once | INTRAVENOUS | Status: AC | PRN
  Administered 2023-03-06: 100 mL via INTRAVENOUS

## 2023-03-12 ENCOUNTER — Telehealth: Payer: Self-pay | Admitting: *Deleted

## 2023-03-12 NOTE — Telephone Encounter (Signed)
Attempted to reach the patient to scheduled a new patient appt with Dr Pricilla Holm on 7/26. LMOM for the patient to call the office back.

## 2023-03-13 NOTE — Telephone Encounter (Signed)
LMOM for patient to call back and schedule new patient appt with Dr Pricilla Holm.

## 2023-03-13 NOTE — Telephone Encounter (Signed)
Spoke with Deborah Travis regarding her referral to GYN oncology. She has an appointment scheduled with Dr. Pricilla Holm on 03/22/23 at 11:15am. Patient agrees to date and time. She has been provided with office address and location. She is also aware of our mask and visitor policy. Patient verbalized understanding and will call with any questions.

## 2023-03-15 ENCOUNTER — Ambulatory Visit: Payer: Medicare HMO | Admitting: Family Medicine

## 2023-03-17 DIAGNOSIS — E118 Type 2 diabetes mellitus with unspecified complications: Secondary | ICD-10-CM | POA: Diagnosis not present

## 2023-03-20 ENCOUNTER — Ambulatory Visit: Payer: Medicare HMO | Admitting: Family Medicine

## 2023-03-21 ENCOUNTER — Encounter: Payer: Self-pay | Admitting: Gynecologic Oncology

## 2023-03-21 NOTE — H&P (View-Only) (Signed)
 GYNECOLOGIC ONCOLOGY NEW PATIENT CONSULTATION   Patient Name: Deborah Travis  Patient Age: 82 y.o. Date of Service: 03/22/23 Referring Provider: Ellison Hughs, MD  Primary Care Provider: Ladora Daniel, PA-C Consulting Provider: Eugene Garnet, MD   Assessment/Plan:  Postmenopausal patient with thickened endometrium and findings concerning for pyometra although no culture samples have shown infection.  The patient and I spent some time reviewing her recent history over the last 3-4 months.  We reviewed her imaging and specifically looked at her CT scan while discussing the fluid in her endometrial canal concerning for either hematometra or pyometra.  Given her description of green discharge, this seems most consistent with pyometra.  In the setting of menopause, pyometra is concerning for an underlying malignancy.  There were no malignant cells noted in the fluid sent at the time of her dilation and endometrial cavity drainage but no tissue was present other than benign endocervical type epithelium.  Given findings thus far, I think that further workup to rule out an underlying malignancy is important.  We discussed 2 options.  The first would be to return to the operating room for procedure similar to what she recently underwent but with a plan for drainage of any fluid within the cavity, hysteroscopy, and hysteroscopic directed endometrial sampling.  While this is a minor procedure, if pathology is found during the surgery, a second surgery will likely be needed.  There is also a risk that she could have recurrence of fluid buildup within the endometrial cavity.  The second option would be to proceed with definitive hysterectomy.  This would allow for complete pathologic assessment of her uterus and help prevent recurrence of her vaginal discharge and endometrial fluid collection.  The risk associated with this is that obviously this major surgery and given her surgical history ( notably a Whipple  procedure years ago), there is increased risk related to adhesive disease.  We discussed multiple approaches for hysterectomy including robotic with laparoscopic entry at the umbilicus with a Hassan trocar, open hysterectomy through a Pfannenstiel incision, and vaginal hysterectomy.  While vaginal hysterectomy would be the least invasive, if a malignancy was found that required additional surgery (such as lymph node sampling), this would require an intra-abdominal procedure at that same time.  After a long discussion about the risks and benefits, the patient would like to proceed with scheduling hysterectomy.  Her preference is to attempt laparoscopic entry and if adhesive disease allows, proceed with robotic total hysterectomy and BSO.  Plan will be to send the uterus for frozen section and any additional procedures such as lymph node sampling would be guided by frozen section results.  If laparoscopic entry is not possible due to adhesions, plan would be for total hysterectomy with BSO through a Pfannenstiel incision.  Patient has a history of insulin-dependent diabetes.  Her last hemoglobin A1c was over 9%.  She has multiple values almost every day that are above 200.  Discussed the perioperative morbidity related to uncontrolled diabetes both from a cardiovascular standpoint but also healing from surgery and risk of wound infection.  My office will reach out to her primary care provider to help with optimizing her diabetes control prior to surgery and for preoperative clearance.  We will get a hemoglobin A1c today.  We reviewed the plan for a robotic assisted hysterectomy vs open hysterectomy, bilateral salpingo-oophorectomy, possible lymph node dissection. The risks of surgery were discussed in detail and she understands these to include infection; wound separation; hernia; vaginal cuff separation,  injury to adjacent organs such as bowel, bladder, blood vessels, ureters and nerves; bleeding which may  require blood transfusion; anesthesia risk; thromboembolic events; possible death; unforeseen complications; possible need for re-exploration; medical complications such as heart attack, stroke, pleural effusion and pneumonia; and, if full lymphadenectomy is performed the risk of lymphedema and lymphocyst. The patient will receive DVT and antibiotic prophylaxis as indicated. She voiced a clear understanding. She had the opportunity to ask questions. Perioperative instructions were reviewed with her. Prescriptions for post-op medications were sent to her pharmacy of choice.  A copy of this note was sent to the patient's referring provider.   70 minutes of total time was spent for this patient encounter, including preparation, face-to-face counseling with the patient and coordination of care, and documentation of the encounter.  Eugene Garnet, MD  Division of Gynecologic Oncology  Department of Obstetrics and Gynecology  [facility]  ___________________________________________  Chief Complaint: Chief Complaint  Patient presents with   Endometrial thickening on ultrasound    History of Present Illness:  Deborah Travis is a 82 y.o. y.o. female who is seen in consultation at the request of Dr. Reina Fuse for an evaluation of endometrial fluid and findings concerning for hematometra vs pyometra.  Patient was initially seen for new onset bloating and increased urinary frequency as well as vaginal discharge was purulent and green. Pelvic ultrasound exam at [facility] on 01/22/2023 reveals a uterus measuring 7.2 x 3.7 x 3.6 cm with an endometrial thickness of 34 mm.  Bilateral ovaries normal in appearance.  Fluid with debris filling the endometrial cavity concerning for hematometra.  Fluid collection measures up to 4.6 cm.  Aspiration of endometrial fluid on 02/14/2023 reveals no malignant cells, benign endocervical type epithelium within a background of abundant acute  inflammation.  Pelvic ultrasound exam at [facility] on 02/26/2023 reveals a uterus measuring 4.65 x 4.2 x 3.2 cm with an endometrial thickness of 11.6 mm.  Bilateral ovaries normal and somewhat atrophic.  Fluid with debris noted within the endometrial cavity with a collection measuring up to 2.9 cm.  CT of the abdomen and pelvis on 03/06/2023 revealed findings consistent with prior Whipple procedure.  No pathologic adenopathy.  Abnormally thick endometrium measuring 1.3 cm.  Unremarkable adnexa.  No gas identified within the endometrial cavity and no findings suggesting hydrosalpinx or pyosalpinx.  Today, the patient notes that green thick drainage started in March or April of this year.  She ignore the discharge initially but then was prompted to reach out to her primary care provider by her daughter who is a Therapist, music.  She saw her primary care provider who collected some swabs and told her to put vinegar in the bath water but her symptoms persisted.  She then was referred to an OB/GYN underwent ultrasound and D&C as noted above.  She was never treated with any oral antibiotics.  Her drainage stopped for about a week after surgery but has started again and is green and thick again.  Since her drainage began, she notes some blood in the discharge.  She endorses a good appetite without nausea or emesis.  She reports normal bowel bladder function.  She notes small amount of weight gain which she attributes to her diabetes.  She has a continuous glucose monitor.  We looked at her glucose values over the last week.  Almost every day, she has at least 1 value over 200, sometimes multiple values.  There was 1 day where she  had a value over 300.  Her last hemoglobin A1c in March was 9.2%.  PAST MEDICAL HISTORY:  Past Medical History:  Diagnosis Date   Arthritis    Chronic kidney disease    RENAL STONE   Chronic pancreatitis (HCC)    Complication of anesthesia    slow to awaken   Diabetes  mellitus Type 2    Duodenal ulcer    Dyslipidemia    GERD (gastroesophageal reflux disease)    Hypertension    Osteopenia    Thyroid disease    HYPOTHYROID   Vitamin D deficiency    Wears hearing aid in both ears      PAST SURGICAL HISTORY:  Past Surgical History:  Procedure Laterality Date   APPENDECTOMY     yrs ago, as child   CATARACT EXTRACTION Right 08/27/2012   CHOLECYSTECTOMY     yrs ago   HYSTEROSCOPY WITH D & C N/A 02/14/2023   Procedure: DILATATION AND CURETTAGE;  Surgeon: Carlisle Cater, MD;  Location: [facility];  Service: Gynecology;  Laterality: N/A;   TONSILLECTOMY AND ADENOIDECTOMY     as child   TUBAL LIGATION     age 19   WHIPPLE PROCEDURE     1998 or 1999    OB/GYN HISTORY:  OB History  Gravida Para Term Preterm AB Living  2 2          SAB IAB Ectopic Multiple Live Births               # Outcome Date GA Lbr Len/2nd Weight Sex Type Anes PTL Lv  2 Para           1 Para             No LMP recorded (lmp unknown). Patient is postmenopausal.  Age at menarche: 50 Age at menopause: G1 Hx of HRT: Denies Hx of STDs: denies Last pap: Unsure History of abnormal pap smears: denies  MEDICATIONS: Outpatient Encounter Medications as of 03/22/2023  Medication Sig   Alcohol Swabs (B-D SINGLE USE SWABS BUTTERFLY) PADS 1 each by Does not apply route 2 (two) times daily.   b complex vitamins capsule Take 1 capsule by mouth daily.   Calcium-Magnesium-Zinc 333-133-5 MG TABS Take by mouth.   cholecalciferol (VITAMIN D3) 25 MCG (1000 UT) tablet Take 1,000 Units by mouth 2 (two) times a day.   Coenzyme Q10 (CO Q 10) 10 MG CAPS Take 10 mg by mouth daily.   Continuous Glucose Sensor (FREESTYLE LIBRE 2 SENSOR) MISC PLACE SENSOR AND CHANGE EVERY 14 DAYS   hydroxychloroquine (PLAQUENIL) 200 MG tablet TAKE 1 TABLET BY MOUTH DAILY  FROM MONDAY THROUGH FRIDAY AND  1/2 TABLET ON SATURDAYS AND  SUNDAYS   insulin aspart (NOVOLOG) 100 UNIT/ML injection  Inject 4 Units into the skin 3 (three) times daily before meals.   insulin glargine, 2 Unit Dial, (TOUJEO MAX SOLOSTAR) 300 UNIT/ML Solostar Pen Inject 10 Units into the skin daily. (Patient taking differently: Inject 10 Units into the skin every evening.)   Insulin Pen Needle (BD PEN NEEDLE NANO 2ND GEN) 32G X 4 MM MISC 1 each by Does not apply route daily.   leflunomide (ARAVA) 20 MG tablet Take 1 tablet (20 mg total) by mouth daily.   levothyroxine (SYNTHROID) 125 MCG tablet Take 1 tablet (125 mcg total) by mouth daily.   lisinopril (ZESTRIL) 10 MG tablet Take 1 tablet (10 mg total) by mouth daily.   Menthol (RICOLA) LOZG  Use as directed 1 each in the mouth or throat as needed.   metFORMIN (GLUCOPHAGE-XR) 500 MG 24 hr tablet TAKE 2 TABLETS BY MOUTH DAILY  WITH BREAKFAST   Multiple Vitamin (MULITIVITAMIN WITH MINERALS) TABS Take 1 tablet by mouth daily.   omeprazole (PRILOSEC) 40 MG capsule TAKE 1 CAPSULE (40 MG TOTAL) BY MOUTH DAILY.   pravastatin (PRAVACHOL) 20 MG tablet TAKE 1 TABLET EVERY DAY   senna-docusate (SENOKOT-S) 8.6-50 MG tablet Take 2 tablets by mouth at bedtime. For AFTER surgery, do not take if having diarrhea   traMADol (ULTRAM) 50 MG tablet Take 1 tablet (50 mg total) by mouth every 6 (six) hours as needed for severe pain. For AFTER surgery only, do not take and drive   TURMERIC PO Take by mouth daily.   VITAMIN E COMPLEX PO Take 1 capsule by mouth daily.    ZENPEP 25000-79000 units CPEP TAKE 3 CAPSULES WITH BREAKFAST, LUNCH AND DINNER AND TAKE 1 CAPSULE WITH EACH SNACK (3 A DAY) (Patient taking differently: TAKE 4 CAPSULES WITH BREAKFAST, LUNCH AND DINNER AND TAKE 2 CAPSULE WITH EACH SNACK (3 A DAY))   Zinc Sulfate (ZINC 15 PO) Take 15 mg by mouth daily.   [DISCONTINUED] ibuprofen (ADVIL) 400 MG tablet Take 1 tablet (400 mg total) by mouth every 6 (six) hours as needed.   No facility-administered encounter medications on file as of 03/22/2023.    ALLERGIES:  Allergies   Allergen Reactions   Levofloxacin     Muscle spasms and tightness    Codeine Itching     FAMILY HISTORY:  Family History  Problem Relation Age of Onset   Stroke Mother    Heart disease Father    Healthy Daughter    Healthy Daughter    Emphysema Daughter    Diabetes Daughter    Hernia Daughter      SOCIAL HISTORY:  Social Connections: Unknown (10/22/2022)   Received from [facility]   Social Network    Social Network: Not on file    REVIEW OF SYSTEMS:  + Hearing loss, ringing in the ears, vaginal bleeding, discharge, joint pain, easy bruising/bleeding. Denies appetite changes, fevers, chills, fatigue, unexplained weight changes. Denies neck lumps or masses, mouth sores or voice changes. Denies cough or wheezing.  Denies shortness of breath. Denies chest pain or palpitations. Denies leg swelling. Denies abdominal distention, pain, blood in stools, constipation, diarrhea, nausea, vomiting, or early satiety. Denies pain with intercourse, dysuria, frequency, hematuria or incontinence. Denies hot flashes, pelvic pain.   Denies back pain or muscle pain/cramps. Denies itching, rash, or wounds. Denies dizziness, headaches, numbness or seizures. Denies swollen lymph nodes or glands. Denies anxiety, depression, confusion, or decreased concentration.  Physical Exam:  Vital Signs for this encounter:  Blood pressure 127/64, pulse 82, temperature 98.2 F (36.8 C), resp. rate 18, height 5\' 3"  (1.6 m), weight 111 lb 4.8 oz (50.5 kg), SpO2 100%. Body mass index is 19.72 kg/m. General: Alert, oriented, no acute distress.  HEENT: Normocephalic, atraumatic. Sclera anicteric.  Chest: Clear to auscultation bilaterally. No wheezes, rhonchi, or rales. Cardiovascular: Regular rate and rhythm, no murmurs, rubs, or gallops.  Abdomen: Normoactive bowel sounds. Soft, nondistended, nontender to palpation. No masses or hepatosplenomegaly appreciated. No palpable fluid wave.  Large  well-healed incision across her upper belly. Extremities: Grossly normal range of motion. Warm, well perfused. No edema bilaterally.  Skin: No rashes or lesions.  Lymphatics: No cervical, supraclavicular, or inguinal adenopathy.  GU:  Normal external  female genitalia.  Somewhat atrophic.  No lesions. No discharge or bleeding.             Bladder/urethra:  No lesions or masses, well supported bladder             Vagina: Moderately atrophic, speculum exam tolerated poorly.  No vaginal lesions noted.  Minimal discharge within the vaginal vault.             Cervix: Normal appearing, no lesions.  Atrophic.             Uterus: Small, mobile, no parametrial involvement or nodularity.             Adnexa: No masses appreciated.  Rectal: Deferred.  LABORATORY AND RADIOLOGIC DATA:  Outside medical records were reviewed to synthesize the above history, along with the history and physical obtained during the visit.   Lab Results  Component Value Date   WBC 8.3 12/04/2022   HGB 12.9 02/14/2023   HCT 38.0 02/14/2023   PLT 329 12/04/2022   GLUCOSE 158 (H) 02/14/2023   CHOL 129 03/09/2022   TRIG 116 03/09/2022   HDL 60 03/09/2022   LDLCALC 48 03/09/2022   ALT 13 01/30/2022   AST 15 01/30/2022   NA 138 02/14/2023   K 4.4 02/14/2023   CL 102 02/14/2023   CREATININE 1.00 02/14/2023   BUN 28 (H) 02/14/2023   CO2 24 05/11/2022   TSH 5.440 (H) 05/10/2020   INR 1.12 01/15/2012   HGBA1C 8.7 (A) 08/10/2022   MICROALBUR 6.5 11/21/2021

## 2023-03-21 NOTE — Progress Notes (Signed)
GYNECOLOGIC ONCOLOGY NEW PATIENT CONSULTATION   Patient Name: Deborah Travis  Patient Age: 82 y.o. Date of Service: 03/22/23 Referring Provider: Ellison Hughs, MD  Primary Care Provider: Ladora Daniel, PA-C Consulting Provider: Eugene Garnet, MD   Assessment/Plan:  Postmenopausal patient with thickened endometrium and findings concerning for pyometra although no culture samples have shown infection.  The patient and I spent some time reviewing her recent history over the last 3-4 months.  We reviewed her imaging and specifically looked at her CT scan while discussing the fluid in her endometrial canal concerning for either hematometra or pyometra.  Given her description of green discharge, this seems most consistent with pyometra.  In the setting of menopause, pyometra is concerning for an underlying malignancy.  There were no malignant cells noted in the fluid sent at the time of her dilation and endometrial cavity drainage but no tissue was present other than benign endocervical type epithelium.  Given findings thus far, I think that further workup to rule out an underlying malignancy is important.  We discussed 2 options.  The first would be to return to the operating room for procedure similar to what she recently underwent but with a plan for drainage of any fluid within the cavity, hysteroscopy, and hysteroscopic directed endometrial sampling.  While this is a minor procedure, if pathology is found during the surgery, a second surgery will likely be needed.  There is also a risk that she could have recurrence of fluid buildup within the endometrial cavity.  The second option would be to proceed with definitive hysterectomy.  This would allow for complete pathologic assessment of her uterus and help prevent recurrence of her vaginal discharge and endometrial fluid collection.  The risk associated with this is that obviously this major surgery and given her surgical history ( notably a Whipple  procedure years ago), there is increased risk related to adhesive disease.  We discussed multiple approaches for hysterectomy including robotic with laparoscopic entry at the umbilicus with a Hassan trocar, open hysterectomy through a Pfannenstiel incision, and vaginal hysterectomy.  While vaginal hysterectomy would be the least invasive, if a malignancy was found that required additional surgery (such as lymph node sampling), this would require an intra-abdominal procedure at that same time.  After a long discussion about the risks and benefits, the patient would like to proceed with scheduling hysterectomy.  Her preference is to attempt laparoscopic entry and if adhesive disease allows, proceed with robotic total hysterectomy and BSO.  Plan will be to send the uterus for frozen section and any additional procedures such as lymph node sampling would be guided by frozen section results.  If laparoscopic entry is not possible due to adhesions, plan would be for total hysterectomy with BSO through a Pfannenstiel incision.  Patient has a history of insulin-dependent diabetes.  Her last hemoglobin A1c was over 9%.  She has multiple values almost every day that are above 200.  Discussed the perioperative morbidity related to uncontrolled diabetes both from a cardiovascular standpoint but also healing from surgery and risk of wound infection.  My office will reach out to her primary care provider to help with optimizing her diabetes control prior to surgery and for preoperative clearance.  We will get a hemoglobin A1c today.  We reviewed the plan for a robotic assisted hysterectomy vs open hysterectomy, bilateral salpingo-oophorectomy, possible lymph node dissection. The risks of surgery were discussed in detail and she understands these to include infection; wound separation; hernia; vaginal cuff separation,  injury to adjacent organs such as bowel, bladder, blood vessels, ureters and nerves; bleeding which may  require blood transfusion; anesthesia risk; thromboembolic events; possible death; unforeseen complications; possible need for re-exploration; medical complications such as heart attack, stroke, pleural effusion and pneumonia; and, if full lymphadenectomy is performed the risk of lymphedema and lymphocyst. The patient will receive DVT and antibiotic prophylaxis as indicated. She voiced a clear understanding. She had the opportunity to ask questions. Perioperative instructions were reviewed with her. Prescriptions for post-op medications were sent to her pharmacy of choice.  A copy of this note was sent to the patient's referring provider.   70 minutes of total time was spent for this patient encounter, including preparation, face-to-face counseling with the patient and coordination of care, and documentation of the encounter.  Deborah Garnet, MD  Division of Gynecologic Oncology  Department of Obstetrics and Gynecology  [facility]  ___________________________________________  Chief Complaint: Chief Complaint  Patient presents with   Endometrial thickening on ultrasound    History of Present Illness:  Deborah Travis is a 82 y.o. y.o. female who is seen in consultation at the request of Dr. Reina Travis for an evaluation of endometrial fluid and findings concerning for hematometra vs pyometra.  Patient was initially seen for new onset bloating and increased urinary frequency as well as vaginal discharge was purulent and green. Pelvic ultrasound exam at [facility] on 01/22/2023 reveals a uterus measuring 7.2 x 3.7 x 3.6 cm with an endometrial thickness of 34 mm.  Bilateral ovaries normal in appearance.  Fluid with debris filling the endometrial cavity concerning for hematometra.  Fluid collection measures up to 4.6 cm.  Aspiration of endometrial fluid on 02/14/2023 reveals no malignant cells, benign endocervical type epithelium within a background of abundant acute  inflammation.  Pelvic ultrasound exam at [facility] on 02/26/2023 reveals a uterus measuring 4.65 x 4.2 x 3.2 cm with an endometrial thickness of 11.6 mm.  Bilateral ovaries normal and somewhat atrophic.  Fluid with debris noted within the endometrial cavity with a collection measuring up to 2.9 cm.  CT of the abdomen and pelvis on 03/06/2023 revealed findings consistent with prior Whipple procedure.  No pathologic adenopathy.  Abnormally thick endometrium measuring 1.3 cm.  Unremarkable adnexa.  No gas identified within the endometrial cavity and no findings suggesting hydrosalpinx or pyosalpinx.  Today, the patient notes that green thick drainage started in March or April of this year.  She ignore the discharge initially but then was prompted to reach out to her primary care provider by her daughter who is a Therapist, music.  She saw her primary care provider who collected some swabs and told her to put vinegar in the bath water but her symptoms persisted.  She then was referred to an OB/GYN underwent ultrasound and D&C as noted above.  She was never treated with any oral antibiotics.  Her drainage stopped for about a week after surgery but has started again and is green and thick again.  Since her drainage began, she notes some blood in the discharge.  She endorses a good appetite without nausea or emesis.  She reports normal bowel bladder function.  She notes small amount of weight gain which she attributes to her diabetes.  She has a continuous glucose monitor.  We looked at her glucose values over the last week.  Almost every day, she has at least 1 value over 200, sometimes multiple values.  There was 1 day where she  had a value over 300.  Her last hemoglobin A1c in March was 9.2%.  PAST MEDICAL HISTORY:  Past Medical History:  Diagnosis Date   Arthritis    Chronic kidney disease    RENAL STONE   Chronic pancreatitis (HCC)    Complication of anesthesia    slow to awaken   Diabetes  mellitus Type 2    Duodenal ulcer    Dyslipidemia    GERD (gastroesophageal reflux disease)    Hypertension    Osteopenia    Thyroid disease    HYPOTHYROID   Vitamin D deficiency    Wears hearing aid in both ears      PAST SURGICAL HISTORY:  Past Surgical History:  Procedure Laterality Date   APPENDECTOMY     yrs ago, as child   CATARACT EXTRACTION Right 08/27/2012   CHOLECYSTECTOMY     yrs ago   HYSTEROSCOPY WITH D & C N/A 02/14/2023   Procedure: DILATATION AND CURETTAGE;  Surgeon: Carlisle Cater, MD;  Location: [facility];  Service: Gynecology;  Laterality: N/A;   TONSILLECTOMY AND ADENOIDECTOMY     as child   TUBAL LIGATION     age 19   WHIPPLE PROCEDURE     1998 or 1999    OB/GYN HISTORY:  OB History  Gravida Para Term Preterm AB Living  2 2          SAB IAB Ectopic Multiple Live Births               # Outcome Date GA Lbr Len/2nd Weight Sex Type Anes PTL Lv  2 Para           1 Para             No LMP recorded (lmp unknown). Patient is postmenopausal.  Age at menarche: 50 Age at menopause: G1 Hx of HRT: Denies Hx of STDs: denies Last pap: Unsure History of abnormal pap smears: denies  MEDICATIONS: Outpatient Encounter Medications as of 03/22/2023  Medication Sig   Alcohol Swabs (B-D SINGLE USE SWABS BUTTERFLY) PADS 1 each by Does not apply route 2 (two) times daily.   b complex vitamins capsule Take 1 capsule by mouth daily.   Calcium-Magnesium-Zinc 333-133-5 MG TABS Take by mouth.   cholecalciferol (VITAMIN D3) 25 MCG (1000 UT) tablet Take 1,000 Units by mouth 2 (two) times a day.   Coenzyme Q10 (CO Q 10) 10 MG CAPS Take 10 mg by mouth daily.   Continuous Glucose Sensor (FREESTYLE LIBRE 2 SENSOR) MISC PLACE SENSOR AND CHANGE EVERY 14 DAYS   hydroxychloroquine (PLAQUENIL) 200 MG tablet TAKE 1 TABLET BY MOUTH DAILY  FROM MONDAY THROUGH FRIDAY AND  1/2 TABLET ON SATURDAYS AND  SUNDAYS   insulin aspart (NOVOLOG) 100 UNIT/ML injection  Inject 4 Units into the skin 3 (three) times daily before meals.   insulin glargine, 2 Unit Dial, (TOUJEO MAX SOLOSTAR) 300 UNIT/ML Solostar Pen Inject 10 Units into the skin daily. (Patient taking differently: Inject 10 Units into the skin every evening.)   Insulin Pen Needle (BD PEN NEEDLE NANO 2ND GEN) 32G X 4 MM MISC 1 each by Does not apply route daily.   leflunomide (ARAVA) 20 MG tablet Take 1 tablet (20 mg total) by mouth daily.   levothyroxine (SYNTHROID) 125 MCG tablet Take 1 tablet (125 mcg total) by mouth daily.   lisinopril (ZESTRIL) 10 MG tablet Take 1 tablet (10 mg total) by mouth daily.   Menthol (RICOLA) LOZG  Use as directed 1 each in the mouth or throat as needed.   metFORMIN (GLUCOPHAGE-XR) 500 MG 24 hr tablet TAKE 2 TABLETS BY MOUTH DAILY  WITH BREAKFAST   Multiple Vitamin (MULITIVITAMIN WITH MINERALS) TABS Take 1 tablet by mouth daily.   omeprazole (PRILOSEC) 40 MG capsule TAKE 1 CAPSULE (40 MG TOTAL) BY MOUTH DAILY.   pravastatin (PRAVACHOL) 20 MG tablet TAKE 1 TABLET EVERY DAY   senna-docusate (SENOKOT-S) 8.6-50 MG tablet Take 2 tablets by mouth at bedtime. For AFTER surgery, do not take if having diarrhea   traMADol (ULTRAM) 50 MG tablet Take 1 tablet (50 mg total) by mouth every 6 (six) hours as needed for severe pain. For AFTER surgery only, do not take and drive   TURMERIC PO Take by mouth daily.   VITAMIN E COMPLEX PO Take 1 capsule by mouth daily.    ZENPEP 25000-79000 units CPEP TAKE 3 CAPSULES WITH BREAKFAST, LUNCH AND DINNER AND TAKE 1 CAPSULE WITH EACH SNACK (3 A DAY) (Patient taking differently: TAKE 4 CAPSULES WITH BREAKFAST, LUNCH AND DINNER AND TAKE 2 CAPSULE WITH EACH SNACK (3 A DAY))   Zinc Sulfate (ZINC 15 PO) Take 15 mg by mouth daily.   [DISCONTINUED] ibuprofen (ADVIL) 400 MG tablet Take 1 tablet (400 mg total) by mouth every 6 (six) hours as needed.   No facility-administered encounter medications on file as of 03/22/2023.    ALLERGIES:  Allergies   Allergen Reactions   Levofloxacin     Muscle spasms and tightness    Codeine Itching     FAMILY HISTORY:  Family History  Problem Relation Age of Onset   Stroke Mother    Heart disease Father    Healthy Daughter    Healthy Daughter    Emphysema Daughter    Diabetes Daughter    Hernia Daughter      SOCIAL HISTORY:  Social Connections: Unknown (10/22/2022)   Received from [facility]   Social Network    Social Network: Not on file    REVIEW OF SYSTEMS:  + Hearing loss, ringing in the ears, vaginal bleeding, discharge, joint pain, easy bruising/bleeding. Denies appetite changes, fevers, chills, fatigue, unexplained weight changes. Denies neck lumps or masses, mouth sores or voice changes. Denies cough or wheezing.  Denies shortness of breath. Denies chest pain or palpitations. Denies leg swelling. Denies abdominal distention, pain, blood in stools, constipation, diarrhea, nausea, vomiting, or early satiety. Denies pain with intercourse, dysuria, frequency, hematuria or incontinence. Denies hot flashes, pelvic pain.   Denies back pain or muscle pain/cramps. Denies itching, rash, or wounds. Denies dizziness, headaches, numbness or seizures. Denies swollen lymph nodes or glands. Denies anxiety, depression, confusion, or decreased concentration.  Physical Exam:  Vital Signs for this encounter:  Blood pressure 127/64, pulse 82, temperature 98.2 F (36.8 C), resp. rate 18, height 5\' 3"  (1.6 m), weight 111 lb 4.8 oz (50.5 kg), SpO2 100%. Body mass index is 19.72 kg/m. General: Alert, oriented, no acute distress.  HEENT: Normocephalic, atraumatic. Sclera anicteric.  Chest: Clear to auscultation bilaterally. No wheezes, rhonchi, or rales. Cardiovascular: Regular rate and rhythm, no murmurs, rubs, or gallops.  Abdomen: Normoactive bowel sounds. Soft, nondistended, nontender to palpation. No masses or hepatosplenomegaly appreciated. No palpable fluid wave.  Large  well-healed incision across her upper belly. Extremities: Grossly normal range of motion. Warm, well perfused. No edema bilaterally.  Skin: No rashes or lesions.  Lymphatics: No cervical, supraclavicular, or inguinal adenopathy.  GU:  Normal external  female genitalia.  Somewhat atrophic.  No lesions. No discharge or bleeding.             Bladder/urethra:  No lesions or masses, well supported bladder             Vagina: Moderately atrophic, speculum exam tolerated poorly.  No vaginal lesions noted.  Minimal discharge within the vaginal vault.             Cervix: Normal appearing, no lesions.  Atrophic.             Uterus: Small, mobile, no parametrial involvement or nodularity.             Adnexa: No masses appreciated.  Rectal: Deferred.  LABORATORY AND RADIOLOGIC DATA:  Outside medical records were reviewed to synthesize the above history, along with the history and physical obtained during the visit.   Lab Results  Component Value Date   WBC 8.3 12/04/2022   HGB 12.9 02/14/2023   HCT 38.0 02/14/2023   PLT 329 12/04/2022   GLUCOSE 158 (H) 02/14/2023   CHOL 129 03/09/2022   TRIG 116 03/09/2022   HDL 60 03/09/2022   LDLCALC 48 03/09/2022   ALT 13 01/30/2022   AST 15 01/30/2022   NA 138 02/14/2023   K 4.4 02/14/2023   CL 102 02/14/2023   CREATININE 1.00 02/14/2023   BUN 28 (H) 02/14/2023   CO2 24 05/11/2022   TSH 5.440 (H) 05/10/2020   INR 1.12 01/15/2012   HGBA1C 8.7 (A) 08/10/2022   MICROALBUR 6.5 11/21/2021

## 2023-03-22 ENCOUNTER — Other Ambulatory Visit: Payer: Self-pay

## 2023-03-22 ENCOUNTER — Encounter: Payer: Self-pay | Admitting: Gynecologic Oncology

## 2023-03-22 ENCOUNTER — Inpatient Hospital Stay: Payer: 59

## 2023-03-22 ENCOUNTER — Inpatient Hospital Stay: Payer: 59 | Attending: Gynecologic Oncology | Admitting: Gynecologic Oncology

## 2023-03-22 ENCOUNTER — Inpatient Hospital Stay: Payer: 59 | Admitting: Gynecologic Oncology

## 2023-03-22 VITALS — BP 127/64 | HR 82 | Temp 98.2°F | Resp 18 | Ht 63.0 in | Wt 111.3 lb

## 2023-03-22 DIAGNOSIS — E1122 Type 2 diabetes mellitus with diabetic chronic kidney disease: Secondary | ICD-10-CM | POA: Diagnosis not present

## 2023-03-22 DIAGNOSIS — Z79890 Hormone replacement therapy (postmenopausal): Secondary | ICD-10-CM | POA: Diagnosis not present

## 2023-03-22 DIAGNOSIS — R9389 Abnormal findings on diagnostic imaging of other specified body structures: Secondary | ICD-10-CM

## 2023-03-22 DIAGNOSIS — N950 Postmenopausal bleeding: Secondary | ICD-10-CM | POA: Diagnosis not present

## 2023-03-22 DIAGNOSIS — E039 Hypothyroidism, unspecified: Secondary | ICD-10-CM | POA: Insufficient documentation

## 2023-03-22 DIAGNOSIS — K861 Other chronic pancreatitis: Secondary | ICD-10-CM | POA: Diagnosis not present

## 2023-03-22 DIAGNOSIS — Z794 Long term (current) use of insulin: Secondary | ICD-10-CM | POA: Diagnosis not present

## 2023-03-22 DIAGNOSIS — Z79899 Other long term (current) drug therapy: Secondary | ICD-10-CM

## 2023-03-22 DIAGNOSIS — Z7969 Long term (current) use of other immunomodulators and immunosuppressants: Secondary | ICD-10-CM | POA: Diagnosis not present

## 2023-03-22 DIAGNOSIS — N898 Other specified noninflammatory disorders of vagina: Secondary | ICD-10-CM | POA: Diagnosis not present

## 2023-03-22 DIAGNOSIS — Z9049 Acquired absence of other specified parts of digestive tract: Secondary | ICD-10-CM

## 2023-03-22 DIAGNOSIS — M1990 Unspecified osteoarthritis, unspecified site: Secondary | ICD-10-CM | POA: Diagnosis not present

## 2023-03-22 DIAGNOSIS — I129 Hypertensive chronic kidney disease with stage 1 through stage 4 chronic kidney disease, or unspecified chronic kidney disease: Secondary | ICD-10-CM | POA: Insufficient documentation

## 2023-03-22 DIAGNOSIS — N189 Chronic kidney disease, unspecified: Secondary | ICD-10-CM | POA: Insufficient documentation

## 2023-03-22 DIAGNOSIS — E785 Hyperlipidemia, unspecified: Secondary | ICD-10-CM | POA: Insufficient documentation

## 2023-03-22 DIAGNOSIS — E559 Vitamin D deficiency, unspecified: Secondary | ICD-10-CM | POA: Diagnosis not present

## 2023-03-22 DIAGNOSIS — E119 Type 2 diabetes mellitus without complications: Secondary | ICD-10-CM

## 2023-03-22 DIAGNOSIS — M8580 Other specified disorders of bone density and structure, unspecified site: Secondary | ICD-10-CM | POA: Diagnosis not present

## 2023-03-22 DIAGNOSIS — K219 Gastro-esophageal reflux disease without esophagitis: Secondary | ICD-10-CM | POA: Diagnosis not present

## 2023-03-22 DIAGNOSIS — Z7984 Long term (current) use of oral hypoglycemic drugs: Secondary | ICD-10-CM | POA: Diagnosis not present

## 2023-03-22 MED ORDER — SENNOSIDES-DOCUSATE SODIUM 8.6-50 MG PO TABS
2.0000 | ORAL_TABLET | Freq: Every day | ORAL | 0 refills | Status: DC
Start: 2023-03-22 — End: 2023-05-08

## 2023-03-22 MED ORDER — TRAMADOL HCL 50 MG PO TABS
50.0000 mg | ORAL_TABLET | Freq: Four times a day (QID) | ORAL | 0 refills | Status: DC | PRN
Start: 2023-03-22 — End: 2023-05-09

## 2023-03-22 NOTE — Patient Instructions (Signed)
We will plan on obtaining a hemoglobin A1C today and will contact you with the results. We will reach out to your PCP about improving your glucose control before surgery. Please keep a check on your blood sugars and watch your intake. Elevated glucose levels can increase your risk for complications from surgery including infections and can affect healing.   Preparing for your Surgery  Plan for surgery on April 10, 2023 with Dr. Eugene Garnet at [facility]. You will be scheduled for robotic assisted total laparoscopic hysterectomy (removal of the uterus and cervix), bilateral salpingo-oophorectomy (removal of both ovaries and fallopian tubes), possible lymph node dissection, possible laparotomy (larger incision on your abdomen if needed).   Pre-operative Testing -You will receive a phone call from presurgical testing at [facility] to arrange for a pre-operative appointment and lab work.  -Bring your insurance card, copy of an advanced directive if applicable, medication list  -At that visit, you will be asked to sign a consent for a possible blood transfusion in case a transfusion becomes necessary during surgery.  The need for a blood transfusion is rare but having consent is a necessary part of your care.     -You should not be taking blood thinners or aspirin at least ten days prior to surgery unless instructed by your surgeon.  -Do not take supplements such as fish oil (omega 3), red yeast rice, turmeric before your surgery. You want to avoid medications with aspirin in them including headache powders such as BC or Goody's), Excedrin migraine.  Day Before Surgery at Home -You will be asked to take in a light diet the day before surgery. You will be advised you can have clear liquids up until 3 hours before your surgery.    Eat a light diet the day before surgery.  Examples including soups, broths, toast, yogurt, mashed potatoes.  AVOID GAS PRODUCING FOODS AND BEVERAGES.  Things to avoid include carbonated beverages (fizzy beverages, sodas), raw fruits and raw vegetables (uncooked), or beans.   If your bowels are filled with gas, your surgeon will have difficulty visualizing your pelvic organs which increases your surgical risks.  Your role in recovery Your role is to become active as soon as directed by your doctor, while still giving yourself time to heal.  Rest when you feel tired. You will be asked to do the following in order to speed your recovery:  - Cough and breathe deeply. This helps to clear and expand your lungs and can prevent pneumonia after surgery.  - STAY ACTIVE WHEN YOU GET HOME. Do mild physical activity. Walking or moving your legs help your circulation and body functions return to normal. Do not try to get up or walk alone the first time after surgery.   -If you develop swelling on one leg or the other, pain in the back of your leg, redness/warmth in one of your legs, please call the office or go to the Emergency Room to have a doppler to rule out a blood clot. For shortness of breath, chest pain-seek care in the Emergency Room as soon as possible. - Actively manage your pain. Managing your pain lets you move in comfort. We will ask you to rate your pain on a scale of zero to 10. It is your responsibility to tell your doctor or nurse where and how much you hurt so your pain can be treated.  Special Considerations -If you are diabetic, you may be placed on insulin after surgery to  have closer control over your blood sugars to promote healing and recovery.  This does not mean that you will be discharged on insulin.  If applicable, your oral antidiabetics will be resumed when you are tolerating a solid diet.  -Your final pathology results from surgery should be available around one week after surgery and the results will be relayed to you when available.  -FMLA forms can be faxed to [phone] and please allow 5-7 business days for  completion.  Pain Management After Surgery -You have been prescribed your pain medication and bowel regimen medications before surgery so that you can have these available when you are discharged from the hospital. The pain medication is for use ONLY AFTER surgery and a new prescription will not be given.   -Make sure that you have Tylenol and Ibuprofen IF YOU ARE ABLE TO TAKE THESE MEDICATIONS at home to use on a regular basis after surgery for pain control. We recommend alternating the medications every hour to six hours since they work differently and are processed in the body differently for pain relief.  -Review the attached handout on narcotic use and their risks and side effects.   Bowel Regimen -You have been prescribed Sennakot-S to take nightly to prevent constipation especially if you are taking the narcotic pain medication intermittently.  It is important to prevent constipation and drink adequate amounts of liquids. You can stop taking this medication when you are not taking pain medication and you are back on your normal bowel routine.  Risks of Surgery Risks of surgery are low but include bleeding, infection, damage to surrounding structures, re-operation, blood clots, and very rarely death.   Blood Transfusion Information (For the consent to be signed before surgery)  We will be checking your blood type before surgery so in case of emergencies, we will know what type of blood you would need.                                            WHAT IS A BLOOD TRANSFUSION?  A transfusion is the replacement of blood or some of its parts. Blood is made up of multiple cells which provide different functions. Red blood cells carry oxygen and are used for blood loss replacement. White blood cells fight against infection. Platelets control bleeding. Plasma helps clot blood. Other blood products are available for specialized needs, such as hemophilia or other clotting disorders. BEFORE THE  TRANSFUSION  Who gives blood for transfusions?  You may be able to donate blood to be used at a later date on yourself (autologous donation). Relatives can be asked to donate blood. This is generally not any safer than if you have received blood from a stranger. The same precautions are taken to ensure safety when a relative's blood is donated. Healthy volunteers who are fully evaluated to make sure their blood is safe. This is blood bank blood. Transfusion therapy is the safest it has ever been in the practice of medicine. Before blood is taken from a donor, a complete history is taken to make sure that person has no history of diseases nor engages in risky social behavior (examples are intravenous drug use or sexual activity with multiple partners). The donor's travel history is screened to minimize risk of transmitting infections, such as malaria. The donated blood is tested for signs of infectious diseases, such as HIV and hepatitis.  The blood is then tested to be sure it is compatible with you in order to minimize the chance of a transfusion reaction. If you or a relative donates blood, this is often done in anticipation of surgery and is not appropriate for emergency situations. It takes many days to process the donated blood. RISKS AND COMPLICATIONS Although transfusion therapy is very safe and saves many lives, the main dangers of transfusion include:  Getting an infectious disease. Developing a transfusion reaction. This is an allergic reaction to something in the blood you were given. Every precaution is taken to prevent this. The decision to have a blood transfusion has been considered carefully by your caregiver before blood is given. Blood is not given unless the benefits outweigh the risks.  AFTER SURGERY INSTRUCTIONS  Return to work: 4-6 weeks if applicable  Activity: 1. Be up and out of the bed during the day.  Take a nap if needed.  You may walk up steps but be careful and use the  hand rail.  Stair climbing will tire you more than you think, you may need to stop part way and rest.   2. No lifting or straining for 6 weeks over 10 pounds. No pushing, pulling, straining for 6 weeks.  3. No driving for around 1 week(s).  Do not drive if you are taking narcotic pain medicine and make sure that your reaction time has returned.   4. You can shower as soon as the next day after surgery. Shower daily.  Use your regular soap and water (not directly on the incision) and pat your incision(s) dry afterwards; don't rub.  No tub baths or submerging your body in water until cleared by your surgeon. If you have the soap that was given to you by pre-surgical testing that was used before surgery, you do not need to use it afterwards because this can irritate your incisions.   5. No sexual activity and nothing in the vagina for 8-10 weeks.  6. You may experience a small amount of clear drainage from your incisions, which is normal.  If the drainage persists, increases, or changes color please call the office.  7. Do not use creams, lotions, or ointments such as neosporin on your incisions after surgery until advised by your surgeon because they can cause removal of the dermabond glue on your incisions.    8. You may experience vaginal spotting after surgery or when the stitches at the top of the vagina begin to dissolve.  The spotting is normal but if you experience heavy bleeding, call our office.  9. Take Tylenol or ibuprofen first for pain if you are able to take these medications and only use narcotic pain medication for severe pain not relieved by the Tylenol or Ibuprofen.  Monitor your Tylenol intake to a max of 4,000 mg in a 24 hour period. You can alternate these medications after surgery.  Diet: 1. Low sodium Heart Healthy Diet is recommended but you are cleared to resume your normal (before surgery) diet after your procedure.  2. It is safe to use a laxative, such as Miralax or  Colace, if you have difficulty moving your bowels. You have been prescribed Sennakot-S to take at bedtime every evening after surgery to keep bowel movements regular and to prevent constipation.    Wound Care: 1. Keep clean and dry.  Shower daily.  Reasons to call the Doctor: Fever - Oral temperature greater than 100.4 degrees Fahrenheit Foul-smelling vaginal discharge Difficulty urinating Nausea  and vomiting Increased pain at the site of the incision that is unrelieved with pain medicine. Difficulty breathing with or without chest pain New calf pain especially if only on one side Sudden, continuing increased vaginal bleeding with or without clots.   Contacts: For questions or concerns you should contact:  Dr. Eugene Garnet at [phone]  Warner Mccreedy, NP at [phone]  After Hours: call [phone] and have the GYN Oncologist paged/contacted (after 5 pm or on the weekends). You will speak with an after hours RN and let he or she know you have had surgery.  Messages sent via mychart are for non-urgent matters and are not responded to after hours so for urgent needs, please call the after hours number.

## 2023-03-22 NOTE — Progress Notes (Signed)
Patient here for a pre-operative appointment prior to her scheduled surgery on April 09, 2013. She is scheduled for a robotic assisted total laparoscopic hysterectomy (removal of the uterus and cervix), bilateral salpingo-oophorectomy (removal of both ovaries and fallopian tubes), possible lymph node dissection, possible laparotomy. The surgery was discussed in detail.  See after visit summary for additional details. Visual aids used to discuss items related to surgery including the incentive spirometer, sequential compression stockings, foley catheter, IV pump, multi-modal pain regimen including tylenol, photo of the surgical robot, female reproductive system to discuss surgery in detail.      Discussed post-op pain management in detail including the aspects of the enhanced recovery pathway.  Advised her that a new prescription would be sent in for Tramadol and it is only to be used for after her upcoming surgery.  We discussed the use of tylenol post-op and to monitor for a maximum of 4,000 mg in a 24 hour period.  Also prescribed sennakot to be used after surgery and to hold if having loose stools.  Discussed bowel regimen in detail.     Discussed the use of SCDs and measures to take at home to prevent DVT including frequent mobility.  Reportable signs and symptoms of DVT discussed. Post-operative instructions discussed and expectations for after surgery. Incisional care discussed as well including reportable signs and symptoms including erythema, drainage, wound separation.     30 minutes spent with the patient.  Verbalizing understanding of material discussed. No needs or concerns voiced at the end of the visit.   Advised patient to call for any needs.  Advised that her post-operative medications had been prescribed and could be picked up at any time.    This appointment is included in the global surgical bundle as pre-operative teaching and has no charge.

## 2023-03-25 ENCOUNTER — Telehealth: Payer: Self-pay | Admitting: Surgery

## 2023-03-25 ENCOUNTER — Telehealth: Payer: Self-pay

## 2023-03-25 NOTE — Telephone Encounter (Signed)
Pt returning Higgins C's call regarding HgA1C results.   Pt is aware of Dr. Everitt Amber message. Pt states she will monitor her glucose and is aware to follow up with her PCP to get it under control before surgery.

## 2023-03-25 NOTE — Telephone Encounter (Signed)
-----   Message from Doylene Bode sent at 03/25/2023  1:24 PM EDT ----- Can you let the patient know her A1C results. She will need to work on good glycemic control over the next couple weeks before surgery ----- Message ----- From: Carver Fila, MD Sent: 03/25/2023  10:57 AM EDT To: Doylene Bode, NP  A1c is not as high as I thought it was going to be. If we can get good glycemic control over next few weeks, I think we are ok to proceed with surgery.

## 2023-03-25 NOTE — Telephone Encounter (Signed)
Called patient to discuss A1C results. LVM to call office back.

## 2023-03-28 NOTE — Progress Notes (Addendum)
COVID Vaccine received:  []  No [x]  Yes Date of any COVID positive Test in last 90 days: No PCP - Ladora Daniel PA-C Cardiologist -   Chest x-ray - 05/11/22 Epic EKG - 05/11/22  EPIC  Stress Test -  ECHO -  Cardiac Cath -   Bowel Prep - [x]  No  []   Yes ______  Pacemaker / ICD device [x]  No []  Yes   Spinal Cord Stimulator:[x]  No []  Yes       History of Sleep Apnea? [x]  No []  Yes   CPAP used?- [x]  No []  Yes    Does the patient monitor blood sugar?          []  No [x]  Yes  []  N/A  Patient has: []  NO Hx DM   []  Pre-DM                 []  DM1  [x]   DM2 Does patient have a [employer] or Dexacom? []  No [x]  Yes   Fasting Blood Sugar Ranges- 60's- 100 Checks Blood Sugar __8___ times a day  GLP1 agonist / usual dose - No GLP1 instructions:  SGLT-2 inhibitors / usual dose - No SGLT-2 instructions:   Blood Thinner / Instructions:No Aspirin Instructions:No  Comments:   Activity level: Patient is able to climb a flight of stairs without difficulty; [x]  No CP  [x]  No SOB, but would have ___   Patient can  perform ADLs without assistance.   Anesthesia review: DM2,HTN,Aortic atherosclerosis, L fasicular block 05/11/22  Patient denies shortness of breath, fever, cough and chest pain at PAT appointment.  Patient verbalized understanding and agreement to the Pre-Surgical Instructions that were given to them at this PAT appointment. Patient was also educated of the need to review these PAT instructions again prior to his/her surgery.I reviewed the appropriate phone numbers to call if they have any and questions or concerns.

## 2023-03-28 NOTE — Patient Instructions (Addendum)
SURGICAL WAITING ROOM VISITATION  Patients having surgery or a procedure may have no more than 2 support people in the waiting area - these visitors may rotate.    Children under the age of 38 must have an adult with them who is not the patient.  Due to an increase in RSV and influenza rates and associated hospitalizations, children ages 49 and under may not visit patients in [facility] hospitals.  If the patient needs to stay at the hospital during part of their recovery, the visitor guidelines for inpatient rooms apply. Pre-op nurse will coordinate an appropriate time for 1 support person to accompany patient in pre-op.  This support person may not rotate.    Please refer to the [facility] website for the visitor guidelines for Inpatients (after your surgery is over and you are in a regular room).       Your procedure is scheduled on: 04/10/23   Report to [facility] Main Entrance    Report to admitting at 11:15 AM   Call this number if you have problems the morning of surgery [phone]   Do not eat food :After Midnight.   After Midnight you may have the following liquids until 10:30 AM DAY OF SURGERY  Water Non-Citrus Juices (without pulp, NO RED-Apple, White grape, White cranberry) Black Coffee (NO MILK/CREAM OR CREAMERS, sugar ok)  Clear Tea (NO MILK/CREAM OR CREAMERS, sugar ok) regular and decaf                             Plain Jell-O (NO RED)                                           Fruit ices (not with fruit pulp, NO RED)                                     Popsicles (NO RED)                                                               Sports drinks like Gatorade (NO RED)                   FOLLOW BOWEL PREP AND ANY ADDITIONAL PRE OP INSTRUCTIONS YOU RECEIVED FROM YOUR SURGEON'S OFFICE!!!     Oral Hygiene is also important to reduce your risk of infection.                                    Remember - BRUSH YOUR TEETH THE MORNING OF SURGERY WITH YOUR  REGULAR TOOTHPASTE   Do NOT smoke after Midnight   Stop all vitamins and herbal supplements 7 days before surgery.   Take these medicines the morning of surgery : Plaquenil, Arava, Synthroid, Omeprazole, Pravachol   DO NOT TAKE ANY ORAL DIABETIC MEDICATIONS DAY OF YOUR SURGERY.  HOLD METFORMIN THE DAY OF SURGERY.             You may  not have any metal on your body including hair pins, jewelry, and body piercing             Do not wear make-up, lotions, powders, perfumes, or deodorant  Do not wear nail polish including gel and S&S, artificial/acrylic nails, or any other type of covering on natural nails including finger and toenails. If you have artificial nails, gel coating, etc. that needs to be removed by a nail salon please have this removed prior to surgery or surgery may need to be canceled/ delayed if the surgeon/ anesthesia feels like they are unable to be safely monitored.   Do not shave  48 hours prior to surgery.    Do not bring valuables to the hospital. [place] IS NOT             RESPONSIBLE   FOR VALUABLES.   Contacts, glasses, dentures or bridgework may not be worn into surgery.  DO NOT BRING YOUR HOME MEDICATIONS TO THE HOSPITAL. PHARMACY WILL DISPENSE MEDICATIONS LISTED ON YOUR MEDICATION LIST TO YOU DURING YOUR ADMISSION IN THE HOSPITAL!    Patients discharged on the day of surgery will not be allowed to drive home.  Someone NEEDS to stay with you for the first 24 hours after anesthesia.   Special Instructions: Bring a copy of your healthcare power of attorney and living will documents the day of surgery if you haven't scanned them before.              Please read over the following fact sheets you were given: IF YOU HAVE QUESTIONS ABOUT YOUR PRE-OP INSTRUCTIONS PLEASE CALL [phone] Rosey Bath   If you received a COVID test during your pre-op visit  it is requested that you wear a mask when out in public, stay away from anyone that may not be feeling well and  notify your surgeon if you develop symptoms. If you test positive for Covid or have been in contact with anyone that has tested positive in the last 10 days please notify you surgeon.    [place] - Preparing for Surgery Before surgery, you can play an important role.  Because skin is not sterile, your skin needs to be as free of germs as possible.  You can reduce the number of germs on your skin by washing with CHG (chlorahexidine gluconate) soap before surgery.  CHG is an antiseptic cleaner which kills germs and bonds with the skin to continue killing germs even after washing. Please DO NOT use if you have an allergy to CHG or antibacterial soaps.  If your skin becomes reddened/irritated stop using the CHG and inform your nurse when you arrive at Short Stay. Do not shave (including legs and underarms) for at least 48 hours prior to the first CHG shower.  You may shave your face/neck.  Please follow these instructions carefully:  1.  Shower with CHG Soap the night before surgery and the  morning of surgery.  2.  If you choose to wash your hair, wash your hair first as usual with your normal  shampoo.  3.  After you shampoo, rinse your hair and body thoroughly to remove the shampoo.                             4.  Use CHG as you would any other liquid soap.  You can apply chg directly to the skin and wash.  Gently with a scrungie or clean  washcloth.  5.  Apply the CHG Soap to your body ONLY FROM THE NECK DOWN.   Do   not use on face/ open                           Wound or open sores. Avoid contact with eyes, ears mouth and   genitals (private parts).                       Wash face,  Genitals (private parts) with your normal soap.             6.  Wash thoroughly, paying special attention to the area where your    surgery  will be performed.  7.  Thoroughly rinse your body with warm water from the neck down.  8.  DO NOT shower/wash with your normal soap after using and rinsing off the CHG Soap.                 9.  Pat yourself dry with a clean towel.            10.  Wear clean pajamas.            11.  Place clean sheets on your bed the night of your first shower and do not  sleep with pets. Day of Surgery : Do not apply any lotions/deodorants the morning of surgery.  Please wear clean clothes to the hospital/surgery center.  FAILURE TO FOLLOW THESE INSTRUCTIONS MAY RESULT IN THE CANCELLATION OF YOUR SURGERY  PATIENT SIGNATURE_________________________________  NURSE SIGNATURE__________________________________  ________________________________________________________________________How to Manage Your Diabetes Before and After Surgery  Why is it important to control my blood sugar before and after surgery? Improving blood sugar levels before and after surgery helps healing and can limit problems. A way of improving blood sugar control is eating a healthy diet by:  Eating less sugar and carbohydrates  Increasing activity/exercise  Talking with your doctor about reaching your blood sugar goals High blood sugars (greater than 180 mg/dL) can raise your risk of infections and slow your recovery, so you will need to focus on controlling your diabetes during the weeks before surgery. Make sure that the doctor who takes care of your diabetes knows about your planned surgery including the date and location.  How do I manage my blood sugar before surgery? Check your blood sugar at least 4 times a day, starting 2 days before surgery, to make sure that the level is not too high or low. Check your blood sugar the morning of your surgery when you wake up and every 2 hours until you get to the Short Stay unit. If your blood sugar is less than 70 mg/dL, you will need to treat for low blood sugar: Do not take insulin. Treat a low blood sugar (less than 70 mg/dL) with  cup of clear juice (cranberry or apple), 4 glucose tablets, OR glucose gel. Recheck blood sugar in 15 minutes after treatment (to  make sure it is greater than 70 mg/dL). If your blood sugar is not greater than 70 mg/dL on recheck, call [phone] for further instructions. Report your blood sugar to the short stay nurse when you get to Short Stay.  If you are admitted to the hospital after surgery: Your blood sugar will be checked by the staff and you will probably be given insulin after surgery (instead of oral diabetes medicines) to make sure you have good  blood sugar levels. The goal for blood sugar control after surgery is 80-180 mg/dL.   WHAT DO I DO ABOUT MY DIABETES MEDICATION?  Do not take oral diabetes medicines (pills) the morning of surgery. HOLD METFORMIN THE DAY OF SURGERY. .   The day before surgery take Normal dose of Novolog in the morning and at lunch but do not take the night time dose.. Take normal dose of Glargine this day.  THE MORNING OF SURGERY Do not take the Novolog insulin at all. Take only half of the Glargine which would be 5 Units.  DO NOT TAKE THE FOLLOWING 7 DAYS PRIOR TO SURGERY: Ozempic, Wegovy, Rybelsus (Semaglutide), Byetta (exenatide), Bydureon (exenatide ER), Victoza, Saxenda (liraglutide), or Trulicity (dulaglutide) Mounjaro (Tirzepatide) Adlyxin (Lixisenatide), Polyethylene Glycol Loxenatide.  If your CBG is greater than 220 mg/dL, you may take  of your sliding scale  (correction) dose of insulin.    Patient Signature:  Date:   Nurse Signature:  Date:   n WHAT IS A BLOOD TRANSFUSION? Blood Transfusion Information  A transfusion is the replacement of blood or some of its parts. Blood is made up of multiple cells which provide different functions. Red blood cells carry oxygen and are used for blood loss replacement. White blood cells fight against infection. Platelets control bleeding. Plasma helps clot blood. Other blood products are available for specialized needs, such as hemophilia or other clotting disorders. BEFORE THE TRANSFUSION  Who gives blood for transfusions?   Healthy volunteers who are fully evaluated to make sure their blood is safe. This is blood bank blood. Transfusion therapy is the safest it has ever been in the practice of medicine. Before blood is taken from a donor, a complete history is taken to make sure that person has no history of diseases nor engages in risky social behavior (examples are intravenous drug use or sexual activity with multiple partners). The donor's travel history is screened to minimize risk of transmitting infections, such as malaria. The donated blood is tested for signs of infectious diseases, such as HIV and hepatitis. The blood is then tested to be sure it is compatible with you in order to minimize the chance of a transfusion reaction. If you or a relative donates blood, this is often done in anticipation of surgery and is not appropriate for emergency situations. It takes many days to process the donated blood. RISKS AND COMPLICATIONS Although transfusion therapy is very safe and saves many lives, the main dangers of transfusion include:  Getting an infectious disease. Developing a transfusion reaction. This is an allergic reaction to something in the blood you were given. Every precaution is taken to prevent this. The decision to have a blood transfusion has been considered carefully by your caregiver before blood is given. Blood is not given unless the benefits outweigh the risks. AFTER THE TRANSFUSION Right after receiving a blood transfusion, you will usually feel much better and more energetic. This is especially true if your red blood cells have gotten low (anemic). The transfusion raises the level of the red blood cells which carry oxygen, and this usually causes an energy increase. The nurse administering the transfusion will monitor you carefully for complications. HOME CARE INSTRUCTIONS  No special instructions are needed after a transfusion. You may find your energy is better. Speak with your caregiver about  any limitations on activity for underlying diseases you may have. SEEK MEDICAL CARE IF:  Your condition is not improving after your transfusion. You develop redness or irritation at  the intravenous (IV) site. SEEK IMMEDIATE MEDICAL CARE IF:  Any of the following symptoms occur over the next 12 hours: Shaking chills. You have a temperature by mouth above 102 F (38.9 C), not controlled by medicine. Chest, back, or muscle pain. People around you feel you are not acting correctly or are confused. Shortness of breath or difficulty breathing. Dizziness and fainting. You get a rash or develop hives. You have a decrease in urine output. Your urine turns a dark color or changes to pink, red, or brown. Any of the following symptoms occur over the next 10 days: You have a temperature by mouth above 102 F (38.9 C), not controlled by medicine. Shortness of breath. Weakness after normal activity. The white part of the eye turns yellow (jaundice). You have a decrease in the amount of urine or are urinating less often. Your urine turns a dark color or changes to pink, red, or brown. Document Released: 08/10/2000 Document Revised: 11/05/2011 Document Reviewed: 03/29/2008 [facility] Patient Information 2014 [place], [place].

## 2023-03-29 ENCOUNTER — Encounter (HOSPITAL_COMMUNITY)
Admission: RE | Admit: 2023-03-29 | Discharge: 2023-03-29 | Disposition: A | Discharge status: Home / Self Care | Payer: 59 | Source: Ambulatory Visit | Attending: Gynecologic Oncology | Admitting: Gynecologic Oncology

## 2023-03-29 ENCOUNTER — Other Ambulatory Visit: Payer: Self-pay

## 2023-03-29 ENCOUNTER — Encounter (HOSPITAL_COMMUNITY): Payer: Self-pay

## 2023-03-29 VITALS — BP 140/72 | HR 84 | Temp 98.2°F | Resp 16 | Ht 63.0 in | Wt 115.0 lb

## 2023-03-29 DIAGNOSIS — E119 Type 2 diabetes mellitus without complications: Secondary | ICD-10-CM | POA: Diagnosis not present

## 2023-03-29 DIAGNOSIS — Z794 Long term (current) use of insulin: Secondary | ICD-10-CM | POA: Diagnosis not present

## 2023-03-29 DIAGNOSIS — R9389 Abnormal findings on diagnostic imaging of other specified body structures: Secondary | ICD-10-CM | POA: Insufficient documentation

## 2023-03-29 DIAGNOSIS — Z01812 Encounter for preprocedural laboratory examination: Secondary | ICD-10-CM | POA: Insufficient documentation

## 2023-03-29 LAB — COMPREHENSIVE METABOLIC PANEL
ALT: 18 U/L (ref 0–44)
AST: 21 U/L (ref 15–41)
Albumin: 3.3 g/dL — ABNORMAL LOW (ref 3.5–5.0)
Alkaline Phosphatase: 98 U/L (ref 38–126)
Anion gap: 9 (ref 5–15)
BUN: 36 mg/dL — ABNORMAL HIGH (ref 8–23)
CO2: 21 mmol/L — ABNORMAL LOW (ref 22–32)
Calcium: 8.6 mg/dL — ABNORMAL LOW (ref 8.9–10.3)
Chloride: 103 mmol/L (ref 98–111)
Creatinine, Ser: 1.16 mg/dL — ABNORMAL HIGH (ref 0.44–1.00)
GFR, Estimated: 48 mL/min — ABNORMAL LOW (ref >60)
Glucose, Bld: 195 mg/dL — ABNORMAL HIGH (ref 70–99)
Potassium: 4.9 mmol/L (ref 3.5–5.1)
Sodium: 133 mmol/L — ABNORMAL LOW (ref 135–145)
Total Bilirubin: 0.4 mg/dL (ref 0.3–1.2)
Total Protein: 7 g/dL (ref 6.5–8.1)

## 2023-03-29 LAB — CBC
HCT: 34.4 % — ABNORMAL LOW (ref 36.0–46.0)
Hemoglobin: 10.7 g/dL — ABNORMAL LOW (ref 12.0–15.0)
MCH: 27.8 pg (ref 26.0–34.0)
MCHC: 31.1 g/dL (ref 30.0–36.0)
MCV: 89.4 fL (ref 80.0–100.0)
Platelets: 298 10*3/uL (ref 150–400)
RBC: 3.85 MIL/uL — ABNORMAL LOW (ref 3.87–5.11)
RDW: 15 % (ref 11.5–15.5)
WBC: 9.4 10*3/uL (ref 4.0–10.5)
nRBC: 0 % (ref 0.0–0.2)

## 2023-03-29 LAB — TYPE AND SCREEN
ABO/RH(D): A POS
Antibody Screen: NEGATIVE

## 2023-04-01 ENCOUNTER — Telehealth: Payer: Self-pay | Admitting: *Deleted

## 2023-04-01 NOTE — Telephone Encounter (Signed)
Attempted to reach patient in regards to her pre-admission test results. Left voicemail requesting call back to [phone]. Results faxed to Patient's PCP Ladora Daniel, PA-C. # [phone].

## 2023-04-01 NOTE — Telephone Encounter (Signed)
Spoke with Deborah Travis and reviewed her pre-op CMP results. Pt advised to keep herself hydrated. Pt verbalized understanding, and states she drinks lots of liquids throughout the day, encouraged pt to keep a water bottle close by and fill it up often and she could dry liquid IV or any electrolyte powder added to her water. Pt receptive and instructed to call the office with any questions or concerns and reminded pt that the office will call the day before surgery for pre-op call. Pt thanked the office for calling.

## 2023-04-04 ENCOUNTER — Telehealth: Payer: Self-pay

## 2023-04-04 NOTE — Telephone Encounter (Signed)
Pt states she isn't hurting as badly after taking the Senakot. However, BM's and lack of passing gas is about the same. Aware of Bowel prep regimen of light diet, staying away from gassy foods. Increase water intake. She states she will monitor for previous S&S of pancreatitis and will follow up with PCP or ER if getting worse.

## 2023-04-04 NOTE — Telephone Encounter (Signed)
Pt is scheduled for surgery on 8/14 with Dr. Pricilla Holm, robotic assisted total laparoscopic hysterectomy, bilateral salpingo-oophorectomy, possible lymph node dissection, possible laparotomy.   Ms. Howorth called the office with a concern of "stomach distension"  Pt states yesterday she felt full but no pain had a normal BM. This morning she woke up with abdominal pain, it feels hard (like I am 4 months pregnant) 5/10 on pain scale. Hasn't taken anything to relieve. Pain is not constant. It starts under breast and radiates into pelvic area. No fever/chills, eating/drinking fine. No N/V. She had a sm BM this morning but had to really push. She has not been passing much gas.  This is a new pain and wanted to ask if she could take a Senokot.   Pt is aware Dr. Pricilla Holm is in the OR, message sent, will call pt with advice

## 2023-04-04 NOTE — Telephone Encounter (Signed)
Voicemail left for patient to call office regarding advise from Dr. Pricilla Holm

## 2023-04-04 NOTE — Telephone Encounter (Signed)
Faxed surgical optimization form to Dr Cordelia Pen office at fax#[phone].Marland Kitchen  Spoke with Cordelia Pen at Dr Herold Harms office and she gave me the info to refax the form to .

## 2023-04-04 NOTE — Telephone Encounter (Signed)
Yes - please have her start bowel regimen. If this feels like prior episodes of pancreatitis (or similar symptoms), please have her call her PCP or present to the ED

## 2023-04-09 ENCOUNTER — Telehealth: Payer: Self-pay | Admitting: *Deleted

## 2023-04-09 NOTE — Telephone Encounter (Signed)
 Telephone call to check on pre-operative status.  Patient compliant with pre-operative instructions.  Reinforced nothing to eat after midnight. Clear liquids until 10:45. Patient to arrive at 11:45.  No questions or concerns voiced.  Instructed to call for any needs.

## 2023-04-09 NOTE — Anesthesia Preprocedure Evaluation (Signed)
Anesthesia Evaluation  Patient identified by MRN, date of birth, ID band Patient awake    Reviewed: Allergy & Precautions, NPO status , Patient's Chart, lab work & pertinent test results  Airway Mallampati: II  TM Distance: >3 FB Neck ROM: Full    Dental no notable dental hx. (+) Teeth Intact, Dental Advisory Given   Pulmonary Current Smoker and Patient abstained from smoking.   Pulmonary exam normal breath sounds clear to auscultation       Cardiovascular hypertension, Normal cardiovascular exam Rhythm:Regular Rate:Normal     Neuro/Psych negative neurological ROS  negative psych ROS   GI/Hepatic PUD,GERD  Medicated and Controlled,,  Endo/Other  diabetes, Type 2, Insulin Dependent, Oral Hypoglycemic AgentsHypothyroidism    Renal/GU Renal diseaseLab Results      Component                Value               Date                          K                        4.9                 03/29/2023                CO2                      21 (L)              03/29/2023                BUN                      36 (H)              03/29/2023                CREATININE               1.16 (H)            03/29/2023                     Musculoskeletal  (+) Arthritis ,    Abdominal   Peds  Hematology Lab Results      Component                Value               Date                      WBC                      9.4                 03/29/2023                HGB                      10.7 (L)            03/29/2023                HCT  34.4 (L)            03/29/2023                MCV                      89.4                03/29/2023                PLT                      298                 03/29/2023              Anesthesia Other Findings All: levofloxacin  Reproductive/Obstetrics                             Anesthesia Physical Anesthesia Plan  ASA: 3  Anesthesia Plan: General    Post-op Pain Management: Ketamine IV* and Ofirmev IV (intra-op)*   Induction: Intravenous  PONV Risk Score and Plan: 3 and Treatment may vary due to age or medical condition and Ondansetron  Airway Management Planned: Oral ETT  Additional Equipment: None  Intra-op Plan:   Post-operative Plan: Extubation in OR  Informed Consent: I have reviewed the patients History and Physical, chart, labs and discussed the procedure including the risks, benefits and alternatives for the proposed anesthesia with the patient or authorized representative who has indicated his/her understanding and acceptance.     Dental advisory given  Plan Discussed with:   Anesthesia Plan Comments: (2 x 18G IV)        Anesthesia Quick Evaluation

## 2023-04-09 NOTE — Telephone Encounter (Signed)
Attempted to reach Deborah Travis for pre-op call. Left voicemail requesting call back to [phone].

## 2023-04-10 ENCOUNTER — Ambulatory Visit (HOSPITAL_BASED_OUTPATIENT_CLINIC_OR_DEPARTMENT_OTHER): Payer: 59 | Admitting: Registered Nurse

## 2023-04-10 ENCOUNTER — Encounter (HOSPITAL_COMMUNITY)
Admission: RE | Disposition: A | Discharge status: Home / Self Care | Payer: Self-pay | Source: Ambulatory Visit | Attending: Gynecologic Oncology

## 2023-04-10 ENCOUNTER — Ambulatory Visit (HOSPITAL_COMMUNITY): Payer: 59 | Admitting: Medical

## 2023-04-10 ENCOUNTER — Ambulatory Visit (HOSPITAL_COMMUNITY)
Admission: RE | Admit: 2023-04-10 | Discharge: 2023-04-10 | Disposition: A | Discharge status: Home / Self Care | Payer: 59 | Source: Ambulatory Visit | Attending: Gynecologic Oncology | Admitting: Gynecologic Oncology

## 2023-04-10 ENCOUNTER — Encounter (HOSPITAL_COMMUNITY): Payer: Self-pay | Admitting: Gynecologic Oncology

## 2023-04-10 ENCOUNTER — Other Ambulatory Visit: Payer: Self-pay

## 2023-04-10 DIAGNOSIS — N189 Chronic kidney disease, unspecified: Secondary | ICD-10-CM | POA: Insufficient documentation

## 2023-04-10 DIAGNOSIS — E119 Type 2 diabetes mellitus without complications: Secondary | ICD-10-CM

## 2023-04-10 DIAGNOSIS — E785 Hyperlipidemia, unspecified: Secondary | ICD-10-CM | POA: Diagnosis not present

## 2023-04-10 DIAGNOSIS — E1122 Type 2 diabetes mellitus with diabetic chronic kidney disease: Secondary | ICD-10-CM | POA: Insufficient documentation

## 2023-04-10 DIAGNOSIS — N898 Other specified noninflammatory disorders of vagina: Secondary | ICD-10-CM | POA: Insufficient documentation

## 2023-04-10 DIAGNOSIS — Z794 Long term (current) use of insulin: Secondary | ICD-10-CM | POA: Insufficient documentation

## 2023-04-10 DIAGNOSIS — N711 Chronic inflammatory disease of uterus: Secondary | ICD-10-CM | POA: Diagnosis not present

## 2023-04-10 DIAGNOSIS — N1832 Chronic kidney disease, stage 3b: Secondary | ICD-10-CM

## 2023-04-10 DIAGNOSIS — I129 Hypertensive chronic kidney disease with stage 1 through stage 4 chronic kidney disease, or unspecified chronic kidney disease: Secondary | ICD-10-CM | POA: Insufficient documentation

## 2023-04-10 DIAGNOSIS — F17210 Nicotine dependence, cigarettes, uncomplicated: Secondary | ICD-10-CM | POA: Diagnosis not present

## 2023-04-10 DIAGNOSIS — K660 Peritoneal adhesions (postprocedural) (postinfection): Secondary | ICD-10-CM | POA: Diagnosis not present

## 2023-04-10 DIAGNOSIS — Z79899 Other long term (current) drug therapy: Secondary | ICD-10-CM | POA: Insufficient documentation

## 2023-04-10 DIAGNOSIS — K219 Gastro-esophageal reflux disease without esophagitis: Secondary | ICD-10-CM | POA: Insufficient documentation

## 2023-04-10 DIAGNOSIS — R9389 Abnormal findings on diagnostic imaging of other specified body structures: Secondary | ICD-10-CM

## 2023-04-10 DIAGNOSIS — Z7984 Long term (current) use of oral hypoglycemic drugs: Secondary | ICD-10-CM | POA: Diagnosis not present

## 2023-04-10 DIAGNOSIS — N959 Unspecified menopausal and perimenopausal disorder: Secondary | ICD-10-CM | POA: Diagnosis not present

## 2023-04-10 HISTORY — PX: ROBOTIC ASSISTED TOTAL HYSTERECTOMY WITH BILATERAL SALPINGO OOPHERECTOMY: SHX6086

## 2023-04-10 LAB — GLUCOSE, CAPILLARY
Glucose-Capillary: 118 mg/dL — ABNORMAL HIGH (ref 70–99)
Glucose-Capillary: 102 mg/dL — ABNORMAL HIGH (ref 70–99)
Glucose-Capillary: 88 mg/dL (ref 70–99)
Glucose-Capillary: 110 mg/dL — ABNORMAL HIGH (ref 70–99)

## 2023-04-10 SURGERY — HYSTERECTOMY, TOTAL, ROBOT-ASSISTED, LAPAROSCOPIC, WITH BILATERAL SALPINGO-OOPHORECTOMY
Anesthesia: General

## 2023-04-10 MED ORDER — EPHEDRINE SULFATE-NACL 50-0.9 MG/10ML-% IV SOSY
PREFILLED_SYRINGE | INTRAVENOUS | Status: DC | PRN
  Administered 2023-04-10: 5 mg via INTRAVENOUS

## 2023-04-10 MED ORDER — DEXAMETHASONE SODIUM PHOSPHATE 10 MG/ML IJ SOLN
INTRAMUSCULAR | Status: DC | PRN
  Administered 2023-04-10: 5 mg via INTRAVENOUS

## 2023-04-10 MED ORDER — BUPIVACAINE HCL 0.25 % IJ SOLN
INTRAMUSCULAR | Status: DC | PRN
  Administered 2023-04-10: 20 mL

## 2023-04-10 MED ORDER — OXYCODONE HCL 5 MG PO TABS: 5.0000 mg | ORAL_TABLET | Freq: Once | ORAL | Status: AC | PRN

## 2023-04-10 MED ORDER — OXYCODONE HCL 5 MG/5ML PO SOLN: 5.0000 mg | Freq: Once | ORAL | Status: AC | PRN

## 2023-04-10 MED ORDER — ACETAMINOPHEN 500 MG PO TABS
1000.0000 mg | ORAL_TABLET | ORAL | Status: AC
  Administered 2023-04-10: 1000 mg via ORAL
  Filled 2023-04-10: qty 2

## 2023-04-10 MED ORDER — INSULIN ASPART 100 UNIT/ML IJ SOLN: 0.0000 [IU] | INTRAMUSCULAR | Status: DC | PRN

## 2023-04-10 MED ORDER — EPHEDRINE 5 MG/ML INJ
INTRAVENOUS | Status: AC
  Filled 2023-04-10: qty 5

## 2023-04-10 MED ORDER — BUPIVACAINE HCL 0.25 % IJ SOLN
INTRAMUSCULAR | Status: AC
  Filled 2023-04-10: qty 1

## 2023-04-10 MED ORDER — STERILE WATER FOR IRRIGATION IR SOLN
Status: DC | PRN
  Administered 2023-04-10: 1000 mL

## 2023-04-10 MED ORDER — ROCURONIUM BROMIDE 10 MG/ML (PF) SYRINGE
PREFILLED_SYRINGE | INTRAVENOUS | Status: AC
  Filled 2023-04-10: qty 10

## 2023-04-10 MED ORDER — DEXAMETHASONE SODIUM PHOSPHATE 4 MG/ML IJ SOLN: 4.0000 mg | INTRAMUSCULAR | Status: DC

## 2023-04-10 MED ORDER — HEPARIN SODIUM (PORCINE) 5000 UNIT/ML IJ SOLN
5000.0000 [IU] | INTRAMUSCULAR | Status: AC
  Administered 2023-04-10: 5000 [IU] via SUBCUTANEOUS
  Filled 2023-04-10: qty 1

## 2023-04-10 MED ORDER — ACETAMINOPHEN 10 MG/ML IV SOLN: 1000.0000 mg | Freq: Once | INTRAVENOUS | Status: DC | PRN

## 2023-04-10 MED ORDER — OXYCODONE HCL 5 MG PO TABS
ORAL_TABLET | ORAL | Status: AC
  Administered 2023-04-10: 5 mg via ORAL
  Filled 2023-04-10: qty 1

## 2023-04-10 MED ORDER — ORAL CARE MOUTH RINSE: 15.0000 mL | Freq: Once | OROMUCOSAL | Status: AC

## 2023-04-10 MED ORDER — PHENYLEPHRINE 80 MCG/ML (10ML) SYRINGE FOR IV PUSH (FOR BLOOD PRESSURE SUPPORT)
PREFILLED_SYRINGE | INTRAVENOUS | Status: DC | PRN
  Administered 2023-04-10: 160 ug via INTRAVENOUS

## 2023-04-10 MED ORDER — FENTANYL CITRATE PF 50 MCG/ML IJ SOSY
25.0000 ug | PREFILLED_SYRINGE | INTRAMUSCULAR | Status: DC | PRN
  Administered 2023-04-10 (×3): 25 ug via INTRAVENOUS

## 2023-04-10 MED ORDER — LACTATED RINGERS IR SOLN
Status: DC | PRN
  Administered 2023-04-10: 1

## 2023-04-10 MED ORDER — FENTANYL CITRATE (PF) 100 MCG/2ML IJ SOLN
INTRAMUSCULAR | Status: DC | PRN
  Administered 2023-04-10: 100 ug via INTRAVENOUS
  Administered 2023-04-10 (×2): 50 ug via INTRAVENOUS

## 2023-04-10 MED ORDER — ROCURONIUM BROMIDE 10 MG/ML (PF) SYRINGE
PREFILLED_SYRINGE | INTRAVENOUS | Status: DC | PRN
  Administered 2023-04-10: 20 mg via INTRAVENOUS
  Administered 2023-04-10: 40 mg via INTRAVENOUS

## 2023-04-10 MED ORDER — PROPOFOL 10 MG/ML IV BOLUS
INTRAVENOUS | Status: AC
  Filled 2023-04-10: qty 20

## 2023-04-10 MED ORDER — FENTANYL CITRATE (PF) 100 MCG/2ML IJ SOLN
INTRAMUSCULAR | Status: AC
  Filled 2023-04-10: qty 2

## 2023-04-10 MED ORDER — DROPERIDOL 2.5 MG/ML IJ SOLN: 0.6250 mg | Freq: Once | INTRAMUSCULAR | Status: DC | PRN

## 2023-04-10 MED ORDER — PROPOFOL 10 MG/ML IV BOLUS
INTRAVENOUS | Status: DC | PRN
Start: 2023-04-10 — End: 2023-04-10
  Administered 2023-04-10: 100 mg via INTRAVENOUS

## 2023-04-10 MED ORDER — FENTANYL CITRATE PF 50 MCG/ML IJ SOSY
PREFILLED_SYRINGE | INTRAMUSCULAR | Status: AC
  Administered 2023-04-10: 50 ug via INTRAVENOUS
  Filled 2023-04-10: qty 2

## 2023-04-10 MED ORDER — LACTATED RINGERS IV SOLN: INTRAVENOUS | Status: DC

## 2023-04-10 MED ORDER — FENTANYL CITRATE PF 50 MCG/ML IJ SOSY
PREFILLED_SYRINGE | INTRAMUSCULAR | Status: AC
  Filled 2023-04-10: qty 1

## 2023-04-10 MED ORDER — PHENYLEPHRINE HCL-NACL 20-0.9 MG/250ML-% IV SOLN
INTRAVENOUS | Status: DC | PRN
Start: 2023-04-10 — End: 2023-04-10
  Administered 2023-04-10: 25 ug/min via INTRAVENOUS

## 2023-04-10 MED ORDER — PHENYLEPHRINE 80 MCG/ML (10ML) SYRINGE FOR IV PUSH (FOR BLOOD PRESSURE SUPPORT)
PREFILLED_SYRINGE | INTRAVENOUS | Status: AC
  Filled 2023-04-10: qty 10

## 2023-04-10 MED ORDER — ONDANSETRON HCL 4 MG/2ML IJ SOLN
INTRAMUSCULAR | Status: DC | PRN
  Administered 2023-04-10: 4 mg via INTRAVENOUS

## 2023-04-10 MED ORDER — CHLORHEXIDINE GLUCONATE 0.12 % MT SOLN
15.0000 mL | Freq: Once | OROMUCOSAL | Status: AC
  Administered 2023-04-10: 15 mL via OROMUCOSAL

## 2023-04-10 MED ORDER — DEXAMETHASONE SODIUM PHOSPHATE 10 MG/ML IJ SOLN
INTRAMUSCULAR | Status: AC
  Filled 2023-04-10: qty 1

## 2023-04-10 MED ORDER — LIDOCAINE 2% (20 MG/ML) 5 ML SYRINGE
INTRAMUSCULAR | Status: DC | PRN
  Administered 2023-04-10: 80 mg via INTRAVENOUS
  Administered 2023-04-10: 1.5 mg/kg/h via INTRAVENOUS

## 2023-04-10 MED ORDER — CEFAZOLIN SODIUM-DEXTROSE 2-4 GM/100ML-% IV SOLN
2.0000 g | INTRAVENOUS | Status: AC
  Administered 2023-04-10: 2 g via INTRAVENOUS
  Filled 2023-04-10: qty 100

## 2023-04-10 MED ORDER — LIDOCAINE HCL 2 % IJ SOLN
INTRAMUSCULAR | Status: AC
  Filled 2023-04-10: qty 20

## 2023-04-10 MED ORDER — ONDANSETRON HCL 4 MG/2ML IJ SOLN
INTRAMUSCULAR | Status: AC
  Filled 2023-04-10: qty 2

## 2023-04-10 MED ORDER — ONDANSETRON HCL 4 MG/2ML IJ SOLN: 4.0000 mg | Freq: Once | INTRAMUSCULAR | Status: DC | PRN

## 2023-04-10 MED ORDER — SUGAMMADEX SODIUM 200 MG/2ML IV SOLN
INTRAVENOUS | Status: DC | PRN
  Administered 2023-04-10: 120 mg via INTRAVENOUS

## 2023-04-10 MED ORDER — LACTATED RINGERS IV SOLN: INTRAVENOUS | Status: DC | PRN

## 2023-04-10 SURGICAL SUPPLY — 81 items
ADH SKN CLS APL DERMABOND .7 (GAUZE/BANDAGES/DRESSINGS) ×1
AGENT HMST KT MTR STRL THRMB (HEMOSTASIS)
APL ESCP 34 STRL LF DISP (HEMOSTASIS)
APPLICATOR SURGIFLO ENDO (HEMOSTASIS) IMPLANT
BAG COUNTER SPONGE SURGICOUNT (BAG) IMPLANT
BAG LAPAROSCOPIC 12 15 PORT 16 (BASKET) IMPLANT
BAG RETRIEVAL 12/15 (BASKET)
BAG SPNG CNTER NS LX DISP (BAG)
BLADE SURG SZ10 CARB STEEL (BLADE) IMPLANT
COVER BACK TABLE 60X90IN (DRAPES) ×2 IMPLANT
COVER TIP SHEARS 8 DVNC (MISCELLANEOUS) ×2 IMPLANT
DERMABOND ADVANCED .7 DNX12 (GAUZE/BANDAGES/DRESSINGS) ×2 IMPLANT
DRAPE ARM DVNC X/XI (DISPOSABLE) ×8 IMPLANT
DRAPE COLUMN DVNC XI (DISPOSABLE) ×2 IMPLANT
DRAPE SHEET LG 3/4 BI-LAMINATE (DRAPES) ×2 IMPLANT
DRAPE SURG IRRIG POUCH 19X23 (DRAPES) ×2 IMPLANT
DRIVER NDL MEGA SUTCUT DVNCXI (INSTRUMENTS) ×2 IMPLANT
DRIVER NDLE MEGA SUTCUT DVNCXI (INSTRUMENTS) ×1
DRSG OPSITE POSTOP 4X6 (GAUZE/BANDAGES/DRESSINGS) IMPLANT
DRSG OPSITE POSTOP 4X8 (GAUZE/BANDAGES/DRESSINGS) IMPLANT
ELECT PENCIL ROCKER SW 15FT (MISCELLANEOUS) IMPLANT
ELECT REM PT RETURN 15FT ADLT (MISCELLANEOUS) ×2 IMPLANT
FORCEPS BPLR FENES DVNC XI (FORCEP) ×2 IMPLANT
FORCEPS PROGRASP DVNC XI (FORCEP) ×2 IMPLANT
GAUZE 4X4 16PLY ~~LOC~~+RFID DBL (SPONGE) ×4 IMPLANT
GLOVE BIO SURGEON STRL SZ 6 (GLOVE) ×8 IMPLANT
GLOVE BIO SURGEON STRL SZ 6.5 (GLOVE) ×2 IMPLANT
GOWN STRL REUS W/ TWL LRG LVL3 (GOWN DISPOSABLE) ×8 IMPLANT
GOWN STRL REUS W/TWL LRG LVL3 (GOWN DISPOSABLE) ×4
GRASPER SUT TROCAR 14GX15 (MISCELLANEOUS) IMPLANT
HOLDER FOLEY CATH W/STRAP (MISCELLANEOUS) IMPLANT
IRRIG SUCT STRYKERFLOW 2 WTIP (MISCELLANEOUS) ×2
IRRIGATION SUCT STRKRFLW 2 WTP (MISCELLANEOUS) ×2 IMPLANT
KIT PROCEDURE DVNC SI (MISCELLANEOUS) IMPLANT
KIT TURNOVER KIT A (KITS) IMPLANT
LIGASURE IMPACT 36 18CM CVD LR (INSTRUMENTS) IMPLANT
MANIPULATOR ADVINCU DEL 3.0 PL (MISCELLANEOUS) IMPLANT
MANIPULATOR ADVINCU DEL 3.5 PL (MISCELLANEOUS) IMPLANT
MANIPULATOR UTERINE 4.5 ZUMI (MISCELLANEOUS) IMPLANT
NDL HYPO 21X1.5 SAFETY (NEEDLE) ×2 IMPLANT
NDL SPNL 18GX3.5 QUINCKE PK (NEEDLE) IMPLANT
NEEDLE HYPO 21X1.5 SAFETY (NEEDLE) ×1
NEEDLE SPNL 18GX3.5 QUINCKE PK (NEEDLE)
OBTURATOR OPTICAL STND 8 DVNC (TROCAR) ×1
OBTURATOR OPTICALSTD 8 DVNC (TROCAR) ×2 IMPLANT
PACK ROBOT GYN CUSTOM WL (TRAY / TRAY PROCEDURE) ×2 IMPLANT
PAD POSITIONING PINK XL (MISCELLANEOUS) ×2 IMPLANT
PENCIL ELECTRO RS 15 CORD (MISCELLANEOUS) IMPLANT
PORT ACCESS TROCAR AIRSEAL 12 (TROCAR) IMPLANT
SCISSORS MNPLR CVD DVNC XI (INSTRUMENTS) ×2 IMPLANT
SCRUB CHG 4% DYNA-HEX 4OZ (MISCELLANEOUS) IMPLANT
SEAL UNIV 5-12 XI (MISCELLANEOUS) ×8 IMPLANT
SET TRI-LUMEN FLTR TB AIRSEAL (TUBING) ×2 IMPLANT
SPIKE FLUID TRANSFER (MISCELLANEOUS) ×2 IMPLANT
SPONGE T-LAP 18X18 ~~LOC~~+RFID (SPONGE) IMPLANT
SURGIFLO W/THROMBIN 8M KIT (HEMOSTASIS) IMPLANT
SUT MNCRL AB 4-0 PS2 18 (SUTURE) IMPLANT
SUT PDS AB 1 TP1 96 (SUTURE) IMPLANT
SUT V-LOC 180 0-0 GS22 (SUTURE) IMPLANT
SUT VIC AB 0 CT1 27 (SUTURE)
SUT VIC AB 0 CT1 27XBRD ANTBC (SUTURE) IMPLANT
SUT VIC AB 2-0 CT1 27 (SUTURE)
SUT VIC AB 2-0 CT1 TAPERPNT 27 (SUTURE) IMPLANT
SUT VIC AB 2-0 SH 27 (SUTURE) ×1
SUT VIC AB 2-0 SH 27X BRD (SUTURE) IMPLANT
SUT VIC AB 4-0 PS2 18 (SUTURE) ×4 IMPLANT
SUT VICRYL 0 27 CT2 27 ABS (SUTURE) ×2 IMPLANT
SUT VLOC 180 0 9IN GS21 (SUTURE) IMPLANT
SYR 10ML LL (SYRINGE) IMPLANT
SYR BULB IRRIG 60ML STRL (SYRINGE) IMPLANT
SYS BAG RETRIEVAL 10MM (BASKET)
SYS WOUND ALEXIS 18CM MED (MISCELLANEOUS)
SYSTEM BAG RETRIEVAL 10MM (BASKET) IMPLANT
SYSTEM WOUND ALEXIS 18CM MED (MISCELLANEOUS) IMPLANT
TOWEL OR NON WOVEN STRL DISP B (DISPOSABLE) IMPLANT
TRAP SPECIMEN MUCUS 40CC (MISCELLANEOUS) IMPLANT
TRAY FOLEY MTR SLVR 16FR STAT (SET/KITS/TRAYS/PACK) ×2 IMPLANT
TROCAR PORT AIRSEAL 5X120 (TROCAR) IMPLANT
UNDERPAD 30X36 HEAVY ABSORB (UNDERPADS AND DIAPERS) ×4 IMPLANT
WATER STERILE IRR 1000ML POUR (IV SOLUTION) ×2 IMPLANT
YANKAUER SUCT BULB TIP 10FT TU (MISCELLANEOUS) IMPLANT

## 2023-04-10 NOTE — Anesthesia Procedure Notes (Addendum)
Procedure Name: Intubation Date/Time: 04/10/2023 2:24 PM  Performed by: Elisabeth Cara, CRNAPre-anesthesia Checklist: Patient identified, Emergency Drugs available, Suction available, Patient being monitored and Timeout performed Patient Re-evaluated:Patient Re-evaluated prior to induction Oxygen Delivery Method: Circle system utilized Preoxygenation: Pre-oxygenation with 100% oxygen Induction Type: IV induction Ventilation: Mask ventilation without difficulty Laryngoscope Size: Mac and 4 Grade View: Grade III Tube type: Oral Number of attempts: 2 Airway Equipment and Method: Bougie stylet Placement Confirmation: ETT inserted through vocal cords under direct vision, positive ETCO2 and breath sounds checked- equal and bilateral Secured at: 21 cm Tube secured with: Tape Dental Injury: Teeth and Oropharynx as per pre-operative assessment  Difficulty Due To: Difficult Airway- due to anterior larynx Comments: Smooth IV induction. Easy mask. DL X 1 by CRNA with MAC 4. Grade 2-3 view. - ETCO2. ETT removed. Easy mask. Head repositioned DL X 2 by Dr Richardson Landry. Grade 2-3 view. Boogie used, Epiglottis noted to be floppy and anterior larynx noted. ATOI. ETT secured at 21 cm at the lip

## 2023-04-10 NOTE — Anesthesia Postprocedure Evaluation (Signed)
Anesthesia Post Note  Patient: Deborah Travis  Procedure(s) Performed: XI ROBOTIC ASSISTED TOTAL HYSTERECTOMY WITH BILATERAL SALPINGO OOPHORECTOMY     Patient location during evaluation: PACU Anesthesia Type: General Level of consciousness: awake and alert Pain management: pain level controlled Vital Signs Assessment: post-procedure vital signs reviewed and stable Respiratory status: spontaneous breathing, nonlabored ventilation, respiratory function stable and patient connected to nasal cannula oxygen Cardiovascular status: blood pressure returned to baseline and stable Postop Assessment: no apparent nausea or vomiting Anesthetic complications: no   No notable events documented.  Last Vitals:  Vitals:   04/10/23 1730 04/10/23 1800  BP: (!) 145/84 (!) 139/92  Pulse: 82 85  Resp: 10   Temp:    SpO2: 93% 97%    Last Pain:  Vitals:   04/10/23 1730  TempSrc:   PainSc: 2                  Trevor Iha

## 2023-04-10 NOTE — Op Note (Signed)
OPERATIVE NOTE  Pre-operative Diagnosis: Thickened endometrium, recollection of pyometra (although previous negative cultures)  Post-operative Diagnosis: same, benign appearing endometrium grossly and microscopically, upper abdominal adhesions related to prior surgery  Operation: Robotic-assisted laparoscopic total hysterectomy with bilateral salpingo-oophorectomy  Surgeon: Eugene Garnet MD  Assistant Surgeon: Antionette Char MD (an MD assistant was necessary for tissue manipulation, management of robotic instrumentation, retraction and positioning due to the complexity of the case and hospital policies).   Anesthesia: GET  Urine Output: 500 cc  Operative Findings: On EUA, small mobile uterus. Purulent blood tinged discharged noted on cervical dilation. On intra-abdominal entry, significant upper abdominal adhesive disease from prior Whipple procedure. Although no obvious adhesions of the small bowel within the pelvis, multiple loops of small bowel were noted to be in the pelvis after the patient was placed in Trendelenburg. An attempt was made to mobilize these, but given degree of gas within the bowel, loops returned to the pelvis after a different loop was mobilized free. Given excellent visualization within the pelvis even with the small bowel loops present, decision was made to leave the small bowel loops within the posterior pelvis. Uterus 6-8 cm and normal in appearance. Atrophic appearing bilateral adnexa. No ascites. On frozen, no endometrial lesion. Random frozen section of endometrium showed benign tissue.  Estimated Blood Loss:  50 cc      Total IV Fluids: see I&O flowsheet         Specimens: uterus, cervix, bilateral tubes and ovaries         Complications:  None apparent; patient tolerated the procedure well.         Disposition: PACU - hemodynamically stable.  Procedure Details  The patient was seen in the Holding Room. The risks, benefits, complications, treatment  options, and expected outcomes were discussed with the patient.  The patient concurred with the proposed plan, giving informed consent.  The site of surgery properly noted/marked. The patient was identified as Deborah Travis and the procedure verified as a Robotic-assisted hysterectomy with bilateral salpingo oophorectomy.   After induction of anesthesia, the patient was draped and prepped in the usual sterile manner. Patient was placed in supine position after anesthesia and draped and prepped in the usual sterile manner as follows: Her arms were tucked to her side with all appropriate precautions.  The patient was secured to the bed using padding and tape across her chest.  The patient was placed in the semi-lithotomy position in [place] stirrups.  The perineum and vagina were prepped with CHG. The patient's abdomen was prepped with ChloraPrep and then she was draped after the prep had been allowed to dry for 3 minutes.  A Time Out was held and the above information confirmed.  The urethra was prepped with Betadine. Foley catheter was placed.  A sterile speculum was placed in the vagina.  The cervix was grasped with a single-tooth tenaculum. The cervix was dilated with Shawnie Pons dilators.  The ZUMI uterine manipulator with a small colpotomizer ring was placed without difficulty.  A pneum occluder balloon was placed over the manipulator.  OG tube placement was confirmed and to suction.   Next, a 12 mm skin incision was made just inferior to the umbilicus. Blunt dissection was used open the subcutaneous tissue. Once the fascia was identified, it was grasped with Kochers, sharply entered, and a stay stitch using 0 Vicryl was placed bilaterally. The peritoneum was then grasped and entered sharply. Intra-abdominal entry was confirmed visually. A robot 12 mm Hassan port  was then placed and the abdomen was insufflated and the findings were noted as above.   At this point and all points during the procedure, the patient's  intra-abdominal pressure did not exceed 15 mmHg. Next, two 8 mm skin incision were made in the right abdomen and one in the left abdomen. A 5 mm incision was placed in the mid left abdomen. 8 mm robotic ports were placed as well as a 5 mm airseal trocar. All ports were placed under direct visualization.  The patient was placed in steep Trendelenburg.  Bowel was folded attempted to be mobilized out of the pelvis, but given degree of gas within the small bowel loops in the pelvis, small bowel was not all able to be mobilized out of the pelvis.  The robot was docked in the normal manner.  The sigmoid colon was mobilized sharply from adhesions along the medial aspect of the right IP ligament. The right and left peritoneum were opened parallel to the IP ligament to open the retroperitoneal spaces bilaterally. The round ligaments were transected. The ureter was noted to be on the medial leaf of the broad ligament.  The peritoneum above the ureter was incised and stretched and the infundibulopelvic ligament was skeletonized, cauterized and cut.    The posterior peritoneum was taken down to the level of the KOH ring.  The anterior peritoneum was also taken down.  The bladder flap was created to the level of the KOH ring.  The uterine artery on the right side was skeletonized, cauterized and cut in the normal manner.  A similar procedure was performed on the left.  The colpotomy was made and the uterus, cervix, bilateral ovaries and tubes were amputated and delivered through the vagina.  Pedicles were inspected and excellent hemostasis was achieved.    The colpotomy at the vaginal cuff was closed with 0 Vicryl on a CT1 needle with a figure of eight at each apex and 0 V-Loc to close the midportion of the cuff in a running manner.  Irrigation was used and excellent hemostasis was achieved.  At this point in the procedure was completed.  Robotic instruments were removed under direct visulaization.  The robot was undocked.  The fascia at the 12 mm port was closed with 0 Vicryl on a UR-5 needle under direct visualization and the stay sutures tied together.  The subcuticular tissue was closed with 4-0 Vicryl and the skin was closed with 4-0 Monocryl in a subcuticular manner.  Dermabond was applied.    The vagina was swabbed with minimal bleeding noted. Vagina was irrigated. A running 2-0 Vicryl was used to achieve hemostasis of a small distal posterior vaginal laceration. Foley catheter was removed.  All sponge, lap and needle counts were correct x  3.   The patient was transferred to the recovery room in stable condition.  Eugene Garnet, MD

## 2023-04-10 NOTE — Discharge Instructions (Signed)

## 2023-04-10 NOTE — Interval H&P Note (Signed)
History and Physical Interval Note:  04/10/2023 1:16 PM  Deborah Travis  has presented today for surgery, with the diagnosis of thickened endometrium.  The various methods of treatment have been discussed with the patient and family. After consideration of risks, benefits and other options for treatment, the patient has consented to  Procedure(s): XI ROBOTIC ASSISTED TOTAL HYSTERECTOMY WITH BILATERAL SALPINGO OOPHORECTOMY, POSSIBLE LAPAROTOMY (N/A) POSSIBLE LYMPH NODE DISSECTION (N/A) as a surgical intervention.  The patient's history has been reviewed, patient examined, no change in status, stable for surgery.  I have reviewed the patient's chart and labs.  Questions were answered to the patient's satisfaction.     Carver Fila

## 2023-04-10 NOTE — Transfer of Care (Signed)
Immediate Anesthesia Transfer of Care Note  Patient: Deborah Travis  Procedure(s) Performed: XI ROBOTIC ASSISTED TOTAL HYSTERECTOMY WITH BILATERAL SALPINGO OOPHORECTOMY  Patient Location: PACU  Anesthesia Type:General  Level of Consciousness: awake and alert   Airway & Oxygen Therapy: Patient Spontanous Breathing and Patient connected to face mask oxygen  Post-op Assessment: Report given to RN and Post -op Vital signs reviewed and stable  Post vital signs: Reviewed and stable  Last Vitals:  Vitals Value Taken Time  BP 155/113 04/10/23 1638  Temp    Pulse 91 04/10/23 1640  Resp 14 04/10/23 1640  SpO2 100 % 04/10/23 1640  Vitals shown include unfiled device data.  Last Pain:  Vitals:   04/10/23 1126  TempSrc:   PainSc: 0-No pain      Patients Stated Pain Goal: 4 (04/10/23 1126)  Complications: No notable events documented.

## 2023-04-11 ENCOUNTER — Telehealth: Payer: Self-pay | Admitting: *Deleted

## 2023-04-11 ENCOUNTER — Encounter (HOSPITAL_COMMUNITY): Payer: Self-pay | Admitting: Gynecologic Oncology

## 2023-04-11 NOTE — Telephone Encounter (Signed)
Spoke with Deborah Travis this morning. She states she is eating, drinking and urinating well. She has not had a BM yet but is passing gas. She is taking senokot as prescribed and encouraged her to drink plenty of water. She denies fever or chills. Incisions are dry and intact. She rates her pain 1/10. Her pain is controlled with tylenol.    Instructed to call office with any fever, chills, purulent drainage, uncontrolled pain or any other questions or concerns. Patient verbalizes understanding.   Pt aware of post op appointments as well as the office number [phone] and after hours number [phone] to call if she has any questions or concerns

## 2023-04-11 NOTE — Telephone Encounter (Signed)
Attempted to reach Deborah Travis for post -op call. Left voicemail requesting call back.

## 2023-04-12 ENCOUNTER — Telehealth: Payer: Self-pay

## 2023-04-12 ENCOUNTER — Telehealth: Payer: Self-pay | Admitting: Gynecologic Oncology

## 2023-04-12 LAB — SURGICAL PATHOLOGY

## 2023-04-12 NOTE — Telephone Encounter (Signed)
Doing well. Reviewed benign pathology. All questions.  Deborah Garnet MD Gynecologic Oncology

## 2023-04-12 NOTE — Telephone Encounter (Signed)
Per secure chat from Dr Pricilla Holm cancelled the patient's phone visit for 04/17/2023.

## 2023-04-15 ENCOUNTER — Other Ambulatory Visit: Payer: Self-pay | Admitting: Physician Assistant

## 2023-04-15 DIAGNOSIS — M0600 Rheumatoid arthritis without rheumatoid factor, unspecified site: Secondary | ICD-10-CM

## 2023-04-17 ENCOUNTER — Inpatient Hospital Stay: Payer: 59 | Admitting: Gynecologic Oncology

## 2023-04-17 DIAGNOSIS — E118 Type 2 diabetes mellitus with unspecified complications: Secondary | ICD-10-CM | POA: Diagnosis not present

## 2023-04-18 ENCOUNTER — Ambulatory Visit: Payer: 59 | Admitting: Dietician

## 2023-04-22 ENCOUNTER — Other Ambulatory Visit: Payer: Self-pay | Admitting: Physician Assistant

## 2023-04-22 DIAGNOSIS — M0600 Rheumatoid arthritis without rheumatoid factor, unspecified site: Secondary | ICD-10-CM

## 2023-04-22 NOTE — Telephone Encounter (Signed)
Last Fill: PLQ 02/14/2023 Arava 12/04/2022  Eye exam: 06/01/2022   Labs: 03/29/2023 Sodium 133, CO2 21, Glucose 195, BUN 36, Creatinine 1.16, Calcium 8.6, Albumin 3.3, GFR 48, RBC 3.85, Hemoglobin 10.7, HCT 34.4,   Next Visit: 05/21/2023  Last Visit: 12/04/2022  [id number] rheumatoid arthritis   Current Dose per office note 12/04/2022: Arava 20 mg 1 tablet by mouth daily, Plaquenil 200 mg 1 tablet daily Monday through Friday and half tab on Saturday and sundays.   Okay to refill Plaquenil and Arava?

## 2023-05-07 NOTE — Progress Notes (Signed)
Office Visit Note  Patient: Deborah Travis             Date of Birth: Nov 06, 1940           MRN: [id number]             PCP: Ladora Daniel, PA-C Referring: Ladora Daniel, PA-C Visit Date: 05/21/2023 Occupation: @GUAROCC @  Subjective:  Intermittent pain and swelling in hands.  History of Present Illness: Deborah Travis is a 82 y.o. female with seronegative rheumatoid arthritis, osteoarthritis and osteopenia.  She continues to take leflunomide 20 mg p.o. daily and hydroxychloroquine 200 mg p.o. daily Monday to Friday and 200 mg half a tablet on Saturday and Sunday.  She states she has been tolerating the combination well.  She notices intermittent pain and swelling in her hands when her rings feel tight.  None of the other joints are painful.  She has noticed decreased grip strength in her hands.  Trigger fingers have resolved.  She currently does not have any discomfort in the cervical spine.    Activities of Daily Living:  Patient reports morning stiffness for 0 minutes.   Patient Denies nocturnal pain.  Difficulty dressing/grooming: Denies Difficulty climbing stairs: Denies Difficulty getting out of chair: Denies Difficulty using hands for taps, buttons, cutlery, and/or writing: Denies  Review of Systems  Constitutional:  Negative for fatigue.  HENT:  Negative for mouth sores and mouth dryness.   Eyes:  Positive for dryness.  Respiratory:  Negative for shortness of breath.   Cardiovascular:  Negative for chest pain and palpitations.  Gastrointestinal:  Negative for blood in stool, constipation and diarrhea.  Endocrine: Negative for increased urination.  Genitourinary:  Negative for involuntary urination.  Musculoskeletal:  Positive for joint swelling and muscle weakness. Negative for joint pain, gait problem, joint pain, myalgias, morning stiffness, muscle tenderness and myalgias.  Skin:  Negative for color change, rash, hair loss and sensitivity to sunlight.  Allergic/Immunologic:  Negative for susceptible to infections.  Neurological:  Negative for dizziness and headaches.  Hematological:  Negative for swollen glands.  Psychiatric/Behavioral:  Negative for depressed mood and sleep disturbance. The patient is not nervous/anxious.     PMFS History:  Patient Active Problem List   Diagnosis Date Noted   Thickened endometrium 04/10/2023   Aortic atherosclerosis (HCC) 06/23/2021   Diabetic nephropathy associated with type 2 diabetes mellitus (HCC) 02/06/2021   Stage 3b chronic kidney disease (HCC) 02/06/2021   Chronic calcific pancreatitis (HCC) 08/12/2020   Atherosclerosis 08/08/2018   Glaucoma 05/26/2018   Hypertensive retinopathy 05/26/2018   Seronegative rheumatoid arthritis (HCC) 03/04/2018   Senile purpura (HCC) 02/01/2015   Former smoker 10/12/2013   History of peptic ulcer disease 07/13/2013   Osteopenia 05/07/2013   Hypothyroidism 01/12/2012   Type 2 diabetes with complication (HCC) 05/21/2011   Hypertension associated with diabetes (HCC) 05/21/2011   Exocrine pancreatic insufficiency 05/21/2011   Hyperlipidemia associated with type 2 diabetes mellitus (HCC) 05/21/2011    Past Medical History:  Diagnosis Date   Arthritis    Chronic kidney disease    RENAL STONE   Chronic pancreatitis (HCC)    Complication of anesthesia    slow to awaken   Diabetes mellitus Type 2    Duodenal ulcer    Dyslipidemia    GERD (gastroesophageal reflux disease)    Hypertension    Osteopenia    Thyroid disease    HYPOTHYROID   Vitamin D deficiency    Wears hearing aid in both ears  Family History  Problem Relation Age of Onset   Stroke Mother    Heart disease Father    Healthy Daughter    Healthy Daughter    Emphysema Daughter    Diabetes Daughter    Hernia Daughter    Past Surgical History:  Procedure Laterality Date   APPENDECTOMY     yrs ago, as child   CATARACT EXTRACTION Right 08/27/2012   CHOLECYSTECTOMY     yrs ago   HYSTEROSCOPY WITH D & C  N/A 02/14/2023   Procedure: DILATATION AND CURETTAGE;  Surgeon: Carlisle Cater, MD;  Location: [facility];  Service: Gynecology;  Laterality: N/A;   ROBOTIC ASSISTED TOTAL HYSTERECTOMY WITH BILATERAL SALPINGO OOPHERECTOMY N/A 04/10/2023   Procedure: XI ROBOTIC ASSISTED TOTAL HYSTERECTOMY WITH BILATERAL SALPINGO OOPHORECTOMY;  Surgeon: Carver Fila, MD;  Location: [facility];  Service: Gynecology;  Laterality: N/A;   TONSILLECTOMY AND ADENOIDECTOMY     as child   TUBAL LIGATION     age 7   WHIPPLE PROCEDURE     1998 or 10   Social History   Social History Narrative   Not on file   Immunization History  Administered Date(s) Administered   Fluad Quad(high Dose 65+) 06/01/2019, 05/10/2020, 06/23/2021, 05/04/2022   Influenza Split 05/21/2011, 06/12/2012, 07/15/2013   Influenza, High Dose Seasonal PF 07/13/2013, 07/01/2014, 06/01/2015, 06/15/2016, 04/15/2017, 05/19/2018   PFIZER Comirnaty(Gray Top)Covid-19 Tri-Sucrose Vaccine 01/25/2021   PFIZER(Purple Top)SARS-COV-2 Vaccination 09/18/2019, 10/09/2019   Pneumococcal Conjugate-13 06/15/2016   Pneumococcal Polysaccharide-23 06/12/2012   Tdap 06/28/2011   Zoster, Live 06/27/2013     Objective: Vital Signs: BP 131/75 (BP Location: Left Arm, Patient Position: Sitting, Cuff Size: Normal)   Pulse 68   Resp 13   Ht 5\' 3"  (1.6 m)   Wt 111 lb 6.4 oz (50.5 kg)   LMP  (LMP Unknown)   BMI 19.73 kg/m    Physical Exam Vitals and nursing note reviewed.  Constitutional:      Appearance: She is well-developed.  HENT:     Head: Normocephalic and atraumatic.  Eyes:     Conjunctiva/sclera: Conjunctivae normal.  Cardiovascular:     Rate and Rhythm: Normal rate and regular rhythm.     Heart sounds: Normal heart sounds.  Pulmonary:     Effort: Pulmonary effort is normal.     Breath sounds: Normal breath sounds.  Abdominal:     General: Bowel sounds are normal.     Palpations: Abdomen is soft.  Musculoskeletal:      Cervical back: Normal range of motion.  Lymphadenopathy:     Cervical: No cervical adenopathy.  Skin:    General: Skin is warm and dry.     Capillary Refill: Capillary refill takes less than 2 seconds.  Neurological:     Mental Status: She is alert and oriented to person, place, and time.  Psychiatric:        Behavior: Behavior normal.      Musculoskeletal Exam: Cervical, thoracic and lumbar spine were in good range of motion.  She had no point tenderness.  Shoulder joints, elbow joints, wrist joints, MCPs PIPs and DIPs were in good range of motion.  She had bilateral MCP PIP and DIP thickening with no synovitis.  Hip joints were in good range of motion without any discomfort.  Knee joints in good range of motion without any warmth swelling or effusion.  There was no tenderness over ankles or MTPs.  CDAI Exam: CDAI Score: -- Patient Global:  10 / 100; Provider Global: 10 / 100 Swollen: --; Tender: -- Joint Exam 05/21/2023   No joint exam has been documented for this visit   There is currently no information documented on the homunculus. Go to the Rheumatology activity and complete the homunculus joint exam.  Investigation: No additional findings.  Imaging: No results found.  Recent Labs: Lab Results  Component Value Date   WBC 9.4 03/29/2023   HGB 10.7 (L) 03/29/2023   PLT 298 03/29/2023   NA 133 (L) 03/29/2023   K 4.9 03/29/2023   CL 103 03/29/2023   CO2 21 (L) 03/29/2023   GLUCOSE 195 (H) 03/29/2023   BUN 36 (H) 03/29/2023   CREATININE 1.16 (H) 03/29/2023   BILITOT 0.4 03/29/2023   ALKPHOS 98 03/29/2023   AST 21 03/29/2023   ALT 18 03/29/2023   PROT 7.0 03/29/2023   ALBUMIN 3.3 (L) 03/29/2023   CALCIUM 8.6 (L) 03/29/2023   GFRAA 40 (L) 01/27/2021   QFTBGOLDPLUS NEGATIVE 03/30/2019    Speciality Comments: PLQ Eye Exam:06/01/2022 WNL @ [facility] Follow up in 8 months   Procedures:  No procedures performed Allergies: Levofloxacin   Assessment / Plan:      Visit Diagnoses: Seronegative rheumatoid arthritis (HCC) - RF-,CCP-, 14-3-3 eta negative, Uric acid 4.8, ANA negative, no erosive changes on XR of hands: Patient complains of intermittent swelling in her when her rings are tight.  No synovitis was noted on the examination today.  As she has not had a flare of rheumatoid arthritis.  She continues to take leflunomide and hydroxychloroquine on a regular basis without any side effects.  High risk medication use - Arava 20 mg 1 tablet by mouth daily, Plaquenil 200 mg 1 tablet daily Monday through Friday and half tab on Saturday and sundays.  Her last eye examination was on June 01, 2022.  Need for annual examination to monitor for ocular toxicity was emphasized.  Labs obtained on March 29, 2023 hemoglobin was low at 10.7.  Creatinine was elevated at 1.16.  Calcium was low but albumin was low as well.  She was advised to get repeat labs in November and every 3 months to monitor for drug toxicity.  She was advised to stop leflunomide if she develops an infection resume after the infection resolves  DDD (degenerative disc disease), cervical-she denies any pain and stiffness in her cervical spine today.  She had good range of motion.  Osteopenia of multiple sites - DEXA on 02/18/18: The BMD measured at [facility] Total Right is 0.700 g/cm2 with a T-score of -2.4.  I offered ordering DEXA scan today.  Patient stated that she will get DEXA scan through her new PCP.  Vitamin D deficiency-she is on vitamin D supplement.  Other medical problems are listed as follows:  Senile purpura (HCC)  History of chronic pancreatitis  Hypertension associated with diabetes (HCC)  Hyperlipidemia associated with type 2 diabetes mellitus (HCC)  History of peptic ulcer disease  History of gastroesophageal reflux (GERD)  History of hypothyroidism  Former smoker  Orders: No orders of the defined types were placed in this encounter.  No orders of the defined types were  placed in this encounter.    Follow-Up Instructions: Return in about 5 months (around 10/21/2023) for Rheumatoid arthritis.   Pollyann Savoy, MD  Note - This record has been created using Animal nutritionist.  Chart creation errors have been sought, but may not always  have been located. Such creation errors do not reflect  on  the standard of medical care.

## 2023-05-09 ENCOUNTER — Encounter: Payer: Self-pay | Admitting: Gynecologic Oncology

## 2023-05-09 ENCOUNTER — Inpatient Hospital Stay: Payer: 59 | Attending: Gynecologic Oncology | Admitting: Gynecologic Oncology

## 2023-05-09 ENCOUNTER — Other Ambulatory Visit: Payer: Self-pay

## 2023-05-09 VITALS — BP 119/61 | HR 87 | Temp 98.4°F | Resp 16 | Ht 63.0 in | Wt 111.4 lb

## 2023-05-09 DIAGNOSIS — Z90710 Acquired absence of both cervix and uterus: Secondary | ICD-10-CM

## 2023-05-09 DIAGNOSIS — R9389 Abnormal findings on diagnostic imaging of other specified body structures: Secondary | ICD-10-CM

## 2023-05-09 DIAGNOSIS — N950 Postmenopausal bleeding: Secondary | ICD-10-CM

## 2023-05-09 DIAGNOSIS — Z90722 Acquired absence of ovaries, bilateral: Secondary | ICD-10-CM

## 2023-05-09 DIAGNOSIS — N898 Other specified noninflammatory disorders of vagina: Secondary | ICD-10-CM

## 2023-05-09 NOTE — Patient Instructions (Signed)
You are healing very well from surgery!  Please remember, no heavy lifting for 6 weeks after surgery and nothing in the vagina for at least 10 weeks.  Please call if you need anything in the future.

## 2023-05-09 NOTE — Progress Notes (Signed)
Gynecologic Oncology Return Clinic Visit  05/09/23  Reason for Visit: follow-up  Treatment History: Patient was initially seen for new onset bloating and increased urinary frequency as well as vaginal discharge was purulent and green. Pelvic ultrasound exam at [facility] on 01/22/2023 reveals a uterus measuring 7.2 x 3.7 x 3.6 cm with an endometrial thickness of 34 mm.  Bilateral ovaries normal in appearance.  Fluid with debris filling the endometrial cavity concerning for hematometra.  Fluid collection measures up to 4.6 cm.   Aspiration of endometrial fluid on 02/14/2023 reveals no malignant cells, benign endocervical type epithelium within a background of abundant acute inflammation.   Pelvic ultrasound exam at [facility] on 02/26/2023 reveals a uterus measuring 4.65 x 4.2 x 3.2 cm with an endometrial thickness of 11.6 mm.  Bilateral ovaries normal and somewhat atrophic.  Fluid with debris noted within the endometrial cavity with a collection measuring up to 2.9 cm.   CT of the abdomen and pelvis on 03/06/2023 revealed findings consistent with prior Whipple procedure.  No pathologic adenopathy.  Abnormally thick endometrium measuring 1.3 cm.  Unremarkable adnexa.  No gas identified within the endometrial cavity and no findings suggesting hydrosalpinx or pyosalpinx.  04/10/23: Robotic-assisted laparoscopic total hysterectomy with bilateral salpingo-oophorectomy   Interval History: Doing well.  Denies any abdominal or pelvic pain.  Denies vaginal bleeding.  Reports baseline bowel bladder function.  Past Medical/Surgical History: Past Medical History:  Diagnosis Date   Arthritis    Chronic kidney disease    RENAL STONE   Chronic pancreatitis (HCC)    Complication of anesthesia    slow to awaken   Diabetes mellitus Type 2    Duodenal ulcer    Dyslipidemia    GERD (gastroesophageal reflux disease)    Hypertension    Osteopenia    Thyroid disease    HYPOTHYROID   Vitamin D  deficiency    Wears hearing aid in both ears     Past Surgical History:  Procedure Laterality Date   APPENDECTOMY     yrs ago, as child   CATARACT EXTRACTION Right 08/27/2012   CHOLECYSTECTOMY     yrs ago   HYSTEROSCOPY WITH D & C N/A 02/14/2023   Procedure: DILATATION AND CURETTAGE;  Surgeon: Carlisle Cater, MD;  Location:  SURGERY CENTER;  Service: Gynecology;  Laterality: N/A;   ROBOTIC ASSISTED TOTAL HYSTERECTOMY WITH BILATERAL SALPINGO OOPHERECTOMY N/A 04/10/2023   Procedure: XI ROBOTIC ASSISTED TOTAL HYSTERECTOMY WITH BILATERAL SALPINGO OOPHORECTOMY;  Surgeon: Carver Fila, MD;  Location: [facility];  Service: Gynecology;  Laterality: N/A;   TONSILLECTOMY AND ADENOIDECTOMY     as child   TUBAL LIGATION     age 35   WHIPPLE PROCEDURE     1998 or 47    Family History  Problem Relation Age of Onset   Stroke Mother    Heart disease Father    Healthy Daughter    Healthy Daughter    Emphysema Daughter    Diabetes Daughter    Hernia Daughter     Social History   Socioeconomic History   Marital status: Single    Spouse name: Not on file   Number of children: Not on file   Years of education: Not on file   Highest education level: Not on file  Occupational History   Not on file  Tobacco Use   Smoking status: Every Day    Current packs/day: 0.25    Types: Cigarettes    Passive  exposure: Past   Smokeless tobacco: Never  Vaping Use   Vaping status: Never Used  Substance and Sexual Activity   Alcohol use: No    Alcohol/week: 0.0 standard drinks of alcohol   Drug use: No   Sexual activity: Not Currently  Other Topics Concern   Not on file  Social History Narrative   Not on file   Social Determinants of Health   Financial Resource Strain: Low Risk  (11/05/2022)   Received from [facility], [facility]   Overall Financial Resource Strain (CARDIA)    Difficulty of Paying Living Expenses: Not hard at all  Food Insecurity: No Food Insecurity  (03/13/2023)   Hunger Vital Sign    Worried About Running Out of Food in the Last Year: Never true    Ran Out of Food in the Last Year: Never true  Transportation Needs: No Transportation Needs (11/05/2022)   Received from [employer], [facility]   PRAPARE - Transportation    Lack of Transportation (Medical): No    Lack of Transportation (Non-Medical): No  Physical Activity: Not on file  Stress: Not on file  Social Connections: Unknown (10/22/2022)   Received from [facility], [facility]   Social Network    Social Network: Not on file    Current Medications:  Current Outpatient Medications:    Alcohol Swabs (B-D SINGLE USE SWABS BUTTERFLY) PADS, 1 each by Does not apply route 2 (two) times daily., Disp: 300 each, Rfl: 3   b complex vitamins capsule, Take 1 capsule by mouth daily., Disp: , Rfl:    Calcium-Magnesium-Zinc 333-133-5 MG TABS, Take 1 tablet by mouth daily., Disp: , Rfl:    cholecalciferol (VITAMIN D3) 25 MCG (1000 UT) tablet, Take 1,000 Units by mouth 2 (two) times a day., Disp: , Rfl:    Coenzyme Q10 (CO Q 10) 100 MG CAPS, Take 100 mg by mouth daily., Disp: , Rfl:    Continuous Glucose Sensor (FREESTYLE LIBRE 2 SENSOR) MISC, PLACE SENSOR AND CHANGE EVERY 14 DAYS, Disp: 8 each, Rfl: 2   hydroxychloroquine (PLAQUENIL) 200 MG tablet, TAKE 1 TABLET BY MOUTH DAILY  FROM MONDAY THROUGH FRIDAY AND  1/2 TABLET ON SATURDAYS AND  SUNDAYS, Disp: 72 tablet, Rfl: 0   insulin aspart (NOVOLOG) 100 UNIT/ML injection, Inject 4 Units into the skin 3 (three) times daily before meals., Disp: , Rfl:    insulin glargine, 2 Unit Dial, (TOUJEO MAX SOLOSTAR) 300 UNIT/ML Solostar Pen, Inject 10 Units into the skin daily. (Patient taking differently: Inject 10 Units into the skin every evening.), Disp: 9 mL, Rfl: 1   Insulin Pen Needle (BD PEN NEEDLE NANO 2ND GEN) 32G X 4 MM MISC, 1 each by Does not apply route daily., Disp: 90 each, Rfl: 1   leflunomide (ARAVA) 20 MG tablet, TAKE 1 TABLET BY  MOUTH DAILY, Disp: 90 tablet, Rfl: 0   levothyroxine (SYNTHROID) 125 MCG tablet, Take 1 tablet (125 mcg total) by mouth daily., Disp: 90 tablet, Rfl: 3   lisinopril (ZESTRIL) 10 MG tablet, Take 1 tablet (10 mg total) by mouth daily., Disp: 90 tablet, Rfl: 3   metFORMIN (GLUCOPHAGE-XR) 500 MG 24 hr tablet, TAKE 2 TABLETS BY MOUTH DAILY  WITH BREAKFAST, Disp: 200 tablet, Rfl: 2   Multiple Vitamin (MULITIVITAMIN WITH MINERALS) TABS, Take 1 tablet by mouth daily., Disp: , Rfl:    omeprazole (PRILOSEC) 40 MG capsule, TAKE 1 CAPSULE (40 MG TOTAL) BY MOUTH DAILY., Disp: 90 capsule, Rfl: 1  pravastatin (PRAVACHOL) 20 MG tablet, TAKE 1 TABLET EVERY DAY, Disp: 90 tablet, Rfl: 0   traMADol (ULTRAM) 50 MG tablet, Take 1 tablet (50 mg total) by mouth every 6 (six) hours as needed for severe pain. For AFTER surgery only, do not take and drive, Disp: 10 tablet, Rfl: 0   TURMERIC PO, Take 2 capsules by mouth daily., Disp: , Rfl:    VITAMIN E COMPLEX PO, Take 1 capsule by mouth daily. , Disp: , Rfl:    ZENPEP 25000-79000 units CPEP, TAKE 3 CAPSULES WITH BREAKFAST, LUNCH AND DINNER AND TAKE 1 CAPSULE WITH EACH SNACK (3 A DAY) (Patient taking differently: TAKE 4 CAPSULES WITH BREAKFAST, LUNCH AND DINNER AND TAKE 2 CAPSULE WITH EACH SNACK (3 A DAY)), Disp: 1000 capsule, Rfl: 3   Zinc Sulfate (ZINC 15 PO), Take 15 mg by mouth daily., Disp: , Rfl:   Review of Systems: Denies appetite changes, fevers, chills, fatigue, unexplained weight changes. Denies hearing loss, neck lumps or masses, mouth sores, ringing in ears or voice changes. Denies cough or wheezing.  Denies shortness of breath. Denies chest pain or palpitations. Denies leg swelling. Denies abdominal distention, pain, blood in stools, constipation, diarrhea, nausea, vomiting, or early satiety. Denies pain with intercourse, dysuria, frequency, hematuria or incontinence. Denies hot flashes, pelvic pain, vaginal bleeding or vaginal discharge.   Denies joint  pain, back pain or muscle pain/cramps. Denies itching, rash, or wounds. Denies dizziness, headaches, numbness or seizures. Denies swollen lymph nodes or glands, denies easy bruising or bleeding. Denies anxiety, depression, confusion, or decreased concentration.  Physical Exam: BP 119/61 (BP Location: Left Arm, Patient Position: Sitting)   Pulse 87   Temp 98.4 F (36.9 C) (Oral)   Resp 16   Ht 5\' 3"  (1.6 m)   Wt 111 lb 6.4 oz (50.5 kg)   LMP  (LMP Unknown)   SpO2 99%   BMI 19.73 kg/m  General: Alert, oriented, no acute distress. HEENT: Normocephalic, atraumatic, sclera anicteric. Chest: Unlabored breathing on room air. Abdomen: soft, nontender.  Normoactive bowel sounds.  No masses or hepatosplenomegaly appreciated.  Well-healed incisions. Extremities: Grossly normal range of motion.  Warm, well perfused.  No edema bilaterally.  GU: Normal appearing external genitalia without erythema, excoriation, or lesions.  Speculum exam reveals cuff intact, suture visible.  Bimanual exam reveals intact, no fluctuance or tenderness to palpation.    Laboratory & Radiologic Studies: A. UTERUS, CERVIX, BILATERAL FALLOPIAN TUBES AND OVARIES, RESECTION:  -  Unremarkable cervix with predominantly denuded/atrophic squamous  mucosa, negative for dysplasia.  -  Endometrium with chronic endometritis (plasma cells and increased  lymphocytes/germinal centers) with focal acute inflammation  (neutrophils/microabscess in focal glands) consistent with concomitant  acute endometritis.  -  Unremarkable myometrium with arterial calcifications (i.e.  Monckeberg's calcific arteriolosclerosis).  -  Unremarkable bilateral fallopian tubes.  -  Unremarkable bilateral senescent ovaries with focal incidental simple  cysts   Assessment & Plan: CHONITA LINDBLOM is a 82 y.o. woman s/p definitive TRH/BSO for thickened endometrium, recurrent pyometra.   Patient is doing very well after surgery.  Meeting all postoperative  milestones.  Discussed continued expectations and restrictions.  Reviewed pathology from surgery with her.  She was given a copy of her pathology report.  She will be discharged back to her OB/GYN and other healthcare providers.  16 minutes of total time was spent for this patient encounter, including preparation, face-to-face counseling with the patient and coordination of care, and documentation of the encounter.  Natalia Leatherwood  Pricilla Holm, MD  Division of Gynecologic Oncology  Department of Obstetrics and Gynecology  [facility]

## 2023-05-18 DIAGNOSIS — E118 Type 2 diabetes mellitus with unspecified complications: Secondary | ICD-10-CM | POA: Diagnosis not present

## 2023-05-21 ENCOUNTER — Ambulatory Visit: Payer: 59 | Attending: Rheumatology | Admitting: Rheumatology

## 2023-05-21 ENCOUNTER — Encounter: Payer: Self-pay | Admitting: Rheumatology

## 2023-05-21 VITALS — BP 131/75 | HR 68 | Resp 13 | Ht 63.0 in | Wt 111.4 lb

## 2023-05-21 DIAGNOSIS — M5030 Other cervical disc degeneration, unspecified cervical region: Secondary | ICD-10-CM | POA: Diagnosis not present

## 2023-05-21 DIAGNOSIS — Z8719 Personal history of other diseases of the digestive system: Secondary | ICD-10-CM | POA: Diagnosis not present

## 2023-05-21 DIAGNOSIS — E559 Vitamin D deficiency, unspecified: Secondary | ICD-10-CM | POA: Diagnosis not present

## 2023-05-21 DIAGNOSIS — M8589 Other specified disorders of bone density and structure, multiple sites: Secondary | ICD-10-CM | POA: Diagnosis not present

## 2023-05-21 DIAGNOSIS — Z87891 Personal history of nicotine dependence: Secondary | ICD-10-CM

## 2023-05-21 DIAGNOSIS — E1169 Type 2 diabetes mellitus with other specified complication: Secondary | ICD-10-CM | POA: Diagnosis not present

## 2023-05-21 DIAGNOSIS — Z8711 Personal history of peptic ulcer disease: Secondary | ICD-10-CM

## 2023-05-21 DIAGNOSIS — M0600 Rheumatoid arthritis without rheumatoid factor, unspecified site: Secondary | ICD-10-CM | POA: Diagnosis not present

## 2023-05-21 DIAGNOSIS — M65341 Trigger finger, right ring finger: Secondary | ICD-10-CM

## 2023-05-21 DIAGNOSIS — D692 Other nonthrombocytopenic purpura: Secondary | ICD-10-CM

## 2023-05-21 DIAGNOSIS — Z8639 Personal history of other endocrine, nutritional and metabolic disease: Secondary | ICD-10-CM

## 2023-05-21 DIAGNOSIS — E1159 Type 2 diabetes mellitus with other circulatory complications: Secondary | ICD-10-CM

## 2023-05-21 DIAGNOSIS — Z79899 Other long term (current) drug therapy: Secondary | ICD-10-CM | POA: Diagnosis not present

## 2023-05-21 DIAGNOSIS — E785 Hyperlipidemia, unspecified: Secondary | ICD-10-CM

## 2023-05-21 DIAGNOSIS — M65351 Trigger finger, right little finger: Secondary | ICD-10-CM

## 2023-05-21 DIAGNOSIS — I152 Hypertension secondary to endocrine disorders: Secondary | ICD-10-CM

## 2023-05-21 NOTE — Patient Instructions (Signed)
Standing Labs We placed an order today for your standing lab work.   Please have your standing labs drawn in November and every 3 months  Please have your labs drawn 2 weeks prior to your appointment so that the provider can discuss your lab results at your appointment, if possible.  Please note that you may see your imaging and lab results in MyChart before we have reviewed them. We will contact you once all results are reviewed. Please allow our office up to 72 hours to thoroughly review all of the results before contacting the office for clarification of your results.  WALK-IN LAB HOURS  Monday through Thursday from 8:00 am -12:30 pm and 1:00 pm-5:00 pm and Friday from 8:00 am-12:00 pm.  Patients with office visits requiring labs will be seen before walk-in labs.  You may encounter longer than normal wait times. Please allow additional time. Wait times may be shorter on  Monday and Thursday afternoons.  We do not book appointments for walk-in labs. We appreciate your patience and understanding with our staff.   Labs are drawn by [facility]. Please bring your co-pay at the time of your lab draw.  You may receive a bill from [facility] for your lab work.  Please note if you are on Hydroxychloroquine and and an order has been placed for a Hydroxychloroquine level,  you will need to have it drawn 4 hours or more after your last dose.  If you wish to have your labs drawn at another location, please call the office 24 hours in advance so we can fax the orders.  The office is located at [address], [place], [place] [address]   If you have any questions regarding directions or hours of operation,  please call [phone].   As a reminder, please drink plenty of water prior to coming for your lab work. Thanks!   Vaccines You are taking a medication(s) that can suppress your immune system.  The following immunizations are recommended: Flu annually Covid-19 RSV  Td/Tdap (tetanus,  diphtheria, pertussis) every 10 years Pneumonia (Prevnar 15 then Pneumovax 23 at least 1 year apart.  Alternatively, can take Prevnar 20 without needing additional dose) Shingrix: 2 doses from 4 weeks to 6 months apart  Please check with your PCP to make sure you are up to date.  If you have signs or symptoms of an infection or start antibiotics: First, call your PCP for workup of your infection. Hold your medication through the infection, until you complete your antibiotics, and until symptoms resolve if you take the following: Injectable medication (Actemra, Benlysta, Cimzia, Cosentyx, Enbrel, Humira, Kevzara, Orencia, Remicade, Simponi, Stelara, Taltz, Tremfya) Methotrexate Leflunomide (Arava) Mycophenolate (Cellcept) Harriette Ohara, Olumiant, or Rinvoq

## 2023-06-07 DIAGNOSIS — E039 Hypothyroidism, unspecified: Secondary | ICD-10-CM | POA: Diagnosis not present

## 2023-06-07 DIAGNOSIS — Z23 Encounter for immunization: Secondary | ICD-10-CM | POA: Diagnosis not present

## 2023-06-07 DIAGNOSIS — N1832 Chronic kidney disease, stage 3b: Secondary | ICD-10-CM | POA: Diagnosis not present

## 2023-06-07 DIAGNOSIS — I700 Atherosclerosis of aorta: Secondary | ICD-10-CM | POA: Diagnosis not present

## 2023-06-07 DIAGNOSIS — I152 Hypertension secondary to endocrine disorders: Secondary | ICD-10-CM | POA: Diagnosis not present

## 2023-06-07 DIAGNOSIS — Z0000 Encounter for general adult medical examination without abnormal findings: Secondary | ICD-10-CM | POA: Diagnosis not present

## 2023-06-07 DIAGNOSIS — K861 Other chronic pancreatitis: Secondary | ICD-10-CM | POA: Diagnosis not present

## 2023-06-07 DIAGNOSIS — E1159 Type 2 diabetes mellitus with other circulatory complications: Secondary | ICD-10-CM | POA: Diagnosis not present

## 2023-06-07 DIAGNOSIS — E1121 Type 2 diabetes mellitus with diabetic nephropathy: Secondary | ICD-10-CM | POA: Diagnosis not present

## 2023-06-07 DIAGNOSIS — E1169 Type 2 diabetes mellitus with other specified complication: Secondary | ICD-10-CM | POA: Diagnosis not present

## 2023-06-07 DIAGNOSIS — E785 Hyperlipidemia, unspecified: Secondary | ICD-10-CM | POA: Diagnosis not present

## 2023-06-07 DIAGNOSIS — Z681 Body mass index (BMI) 19 or less, adult: Secondary | ICD-10-CM | POA: Diagnosis not present

## 2023-06-12 ENCOUNTER — Encounter: Payer: Self-pay | Admitting: Gynecology

## 2023-06-17 DIAGNOSIS — E118 Type 2 diabetes mellitus with unspecified complications: Secondary | ICD-10-CM | POA: Diagnosis not present

## 2023-06-19 ENCOUNTER — Other Ambulatory Visit: Payer: Self-pay | Admitting: Physician Assistant

## 2023-06-19 DIAGNOSIS — M0600 Rheumatoid arthritis without rheumatoid factor, unspecified site: Secondary | ICD-10-CM

## 2023-06-23 ENCOUNTER — Other Ambulatory Visit: Payer: Self-pay | Admitting: Physician Assistant

## 2023-06-23 DIAGNOSIS — M0600 Rheumatoid arthritis without rheumatoid factor, unspecified site: Secondary | ICD-10-CM

## 2023-07-12 ENCOUNTER — Other Ambulatory Visit: Payer: Self-pay | Admitting: Family Medicine

## 2023-07-12 DIAGNOSIS — K8681 Exocrine pancreatic insufficiency: Secondary | ICD-10-CM

## 2023-07-18 DIAGNOSIS — M069 Rheumatoid arthritis, unspecified: Secondary | ICD-10-CM | POA: Diagnosis not present

## 2023-07-18 DIAGNOSIS — Z79899 Other long term (current) drug therapy: Secondary | ICD-10-CM | POA: Diagnosis not present

## 2023-07-18 DIAGNOSIS — E118 Type 2 diabetes mellitus with unspecified complications: Secondary | ICD-10-CM | POA: Diagnosis not present

## 2023-07-18 DIAGNOSIS — H401121 Primary open-angle glaucoma, left eye, mild stage: Secondary | ICD-10-CM | POA: Diagnosis not present

## 2023-07-18 DIAGNOSIS — H40021 Open angle with borderline findings, high risk, right eye: Secondary | ICD-10-CM | POA: Diagnosis not present

## 2023-07-18 LAB — HM DIABETES EYE EXAM

## 2023-07-24 ENCOUNTER — Encounter: Payer: Self-pay | Admitting: Family Medicine

## 2023-07-31 ENCOUNTER — Other Ambulatory Visit: Payer: Self-pay | Admitting: Family Medicine

## 2023-07-31 DIAGNOSIS — K8681 Exocrine pancreatic insufficiency: Secondary | ICD-10-CM

## 2023-07-31 NOTE — Telephone Encounter (Signed)
Called pt x2, no answer, straight to voicemail. Pt needs appt before filling.

## 2023-08-06 ENCOUNTER — Other Ambulatory Visit: Payer: Self-pay | Admitting: Family Medicine

## 2023-08-06 DIAGNOSIS — E118 Type 2 diabetes mellitus with unspecified complications: Secondary | ICD-10-CM

## 2023-08-15 ENCOUNTER — Other Ambulatory Visit: Payer: Self-pay | Admitting: *Deleted

## 2023-08-15 DIAGNOSIS — M0600 Rheumatoid arthritis without rheumatoid factor, unspecified site: Secondary | ICD-10-CM

## 2023-08-15 MED ORDER — HYDROXYCHLOROQUINE SULFATE 200 MG PO TABS: ORAL_TABLET | ORAL | 0 refills | Status: DC

## 2023-08-15 NOTE — Telephone Encounter (Signed)
Last Fill: 04/22/2023  Eye exam: 07/18/2023   Labs: 03/29/2023 Sodium 133, CO2 21, Glucose 195, BUN 36, Creatinine 1.16, Calcium 8.6, Albumin 3.3, GFR 48, RBC 3.85, hemoglobin 10.7, HCT 34.4,   Next Visit: 2/242025  Last Visit: 05/21/2023  DX: Seronegative rheumatoid arthritis   Current Dose per office note 05/21/2023: Plaquenil 200 mg 1 tablet daily Monday through Friday and half tab on Saturday and sundays   Okay to refill Plaquenil?

## 2023-10-07 ENCOUNTER — Other Ambulatory Visit: Payer: Self-pay | Admitting: Family Medicine

## 2023-10-07 DIAGNOSIS — E118 Type 2 diabetes mellitus with unspecified complications: Secondary | ICD-10-CM

## 2023-10-09 NOTE — Progress Notes (Unsigned)
 Office Visit Note  Patient: Deborah Travis             Date of Birth: 08-20-1941           MRN: [id number]             PCP: Ladora Daniel, PA-C Referring: Ladora Daniel, PA-C Visit Date: 10/23/2023 Occupation: @GUAROCC @  Subjective:  Medication monitoring   History of Present Illness: Deborah Travis is a 83 y.o. female with history of seronegative rheumatoid arthritis and DDD.  She remains on Arava 20 mg 1 tablet by mouth daily and Plaquenil 200 mg 1 tablet daily Monday through Friday and half tab on Saturday and sundays.  She is tolerating combination therapy without any side effects.  She has not had any recent gaps in therapy.  She denies any signs or symptoms of a rheumatoid arthritis flare.  Patient presents today with pain on the lateral aspect of both hips consistent with directional bursitis.  She is been experiencing pain at night especially when lying on her sides.  She has been trying to perform wall Pilates on a regular basis for exercise.  She denies any groin pain.  She denies any joint swelling.  She denies any new medical conditions.  Activities of Daily Living:  Patient reports morning stiffness for 0 minutes.   Patient Reports nocturnal pain.  Difficulty dressing/grooming: Denies Difficulty climbing stairs: Denies Difficulty getting out of chair: Denies Difficulty using hands for taps, buttons, cutlery, and/or writing: Reports  Review of Systems  Constitutional:  Negative for fatigue.  HENT:  Negative for mouth sores, mouth dryness and nose dryness.   Eyes:  Negative for pain and dryness.  Respiratory:  Negative for shortness of breath and difficulty breathing.   Cardiovascular:  Negative for chest pain and palpitations.  Gastrointestinal:  Negative for blood in stool, constipation and diarrhea.  Endocrine: Positive for increased urination.  Genitourinary:  Negative for involuntary urination.  Musculoskeletal:  Positive for joint pain and joint pain. Negative for gait  problem, joint swelling, myalgias, muscle weakness, morning stiffness, muscle tenderness and myalgias.  Skin:  Negative for color change, rash, hair loss and sensitivity to sunlight.  Allergic/Immunologic: Negative for susceptible to infections.  Neurological:  Negative for dizziness and headaches.  Hematological:  Negative for swollen glands.  Psychiatric/Behavioral:  Negative for depressed mood and sleep disturbance. The patient is not nervous/anxious.     PMFS History:  Patient Active Problem List   Diagnosis Date Noted   Thickened endometrium 04/10/2023   Aortic atherosclerosis (HCC) 06/23/2021   Diabetic nephropathy associated with type 2 diabetes mellitus (HCC) 02/06/2021   Stage 3b chronic kidney disease (HCC) 02/06/2021   Chronic calcific pancreatitis (HCC) 08/12/2020   Atherosclerosis 08/08/2018   Glaucoma 05/26/2018   Hypertensive retinopathy 05/26/2018   Seronegative rheumatoid arthritis (HCC) 03/04/2018   Senile purpura (HCC) 02/01/2015   Former smoker 10/12/2013   History of peptic ulcer disease 07/13/2013   Osteopenia 05/07/2013   Hypothyroidism 01/12/2012   Type 2 diabetes with complication (HCC) 05/21/2011   Hypertension associated with diabetes (HCC) 05/21/2011   Exocrine pancreatic insufficiency 05/21/2011   Hyperlipidemia associated with type 2 diabetes mellitus (HCC) 05/21/2011    Past Medical History:  Diagnosis Date   Arthritis    Chronic kidney disease    RENAL STONE   Chronic pancreatitis (HCC)    Complication of anesthesia    slow to awaken   Diabetes mellitus Type 2    Duodenal ulcer  Dyslipidemia    GERD (gastroesophageal reflux disease)    Hypertension    Osteopenia    Thyroid disease    HYPOTHYROID   Vitamin D deficiency    Wears hearing aid in both ears     Family History  Problem Relation Age of Onset   Stroke Mother    Heart disease Father    Healthy Daughter    Healthy Daughter    Emphysema Daughter    Diabetes Daughter     Hernia Daughter    Past Surgical History:  Procedure Laterality Date   APPENDECTOMY     yrs ago, as child   CATARACT EXTRACTION Right 08/27/2012   CHOLECYSTECTOMY     yrs ago   HYSTEROSCOPY WITH D & C N/A 02/14/2023   Procedure: DILATATION AND CURETTAGE;  Surgeon: Carlisle Cater, MD;  Location: [facility];  Service: Gynecology;  Laterality: N/A;   ROBOTIC ASSISTED TOTAL HYSTERECTOMY WITH BILATERAL SALPINGO OOPHERECTOMY N/A 04/10/2023   Procedure: XI ROBOTIC ASSISTED TOTAL HYSTERECTOMY WITH BILATERAL SALPINGO OOPHORECTOMY;  Surgeon: Carver Fila, MD;  Location: [facility];  Service: Gynecology;  Laterality: N/A;   TONSILLECTOMY AND ADENOIDECTOMY     as child   TUBAL LIGATION     age 48   WHIPPLE PROCEDURE     1998 or 38   Social History   Social History Narrative   Not on file   Immunization History  Administered Date(s) Administered   Fluad Quad(high Dose 65+) 06/01/2019, 05/10/2020, 06/23/2021, 05/04/2022   Influenza Split 05/21/2011, 06/12/2012, 07/15/2013   Influenza, High Dose Seasonal PF 07/13/2013, 07/01/2014, 06/01/2015, 06/15/2016, 04/15/2017, 05/19/2018   PFIZER Comirnaty(Gray Top)Covid-19 Tri-Sucrose Vaccine 01/25/2021   PFIZER(Purple Top)SARS-COV-2 Vaccination 09/18/2019, 10/09/2019   Pneumococcal Conjugate-13 06/15/2016   Pneumococcal Polysaccharide-23 06/12/2012   Tdap 06/28/2011   Zoster, Live 06/27/2013     Objective: Vital Signs: BP 132/77 (BP Location: Right Arm, Patient Position: Sitting, Cuff Size: Normal)   Pulse 77   Resp 14   Ht 5\' 2"  (1.575 m)   Wt 111 lb 12.8 oz (50.7 kg)   LMP  (LMP Unknown)   BMI 20.45 kg/m    Physical Exam Vitals and nursing note reviewed.  Constitutional:      Appearance: She is well-developed.  HENT:     Head: Normocephalic and atraumatic.  Eyes:     Conjunctiva/sclera: Conjunctivae normal.  Cardiovascular:     Rate and Rhythm: Normal rate and regular rhythm.     Heart sounds: Normal heart  sounds.  Pulmonary:     Effort: Pulmonary effort is normal.     Breath sounds: Normal breath sounds.  Abdominal:     General: Bowel sounds are normal.     Palpations: Abdomen is soft.  Musculoskeletal:     Cervical back: Normal range of motion.  Lymphadenopathy:     Cervical: No cervical adenopathy.  Skin:    General: Skin is warm and dry.     Capillary Refill: Capillary refill takes less than 2 seconds.  Neurological:     Mental Status: She is alert and oriented to person, place, and time.  Psychiatric:        Behavior: Behavior normal.      Musculoskeletal Exam: C-spine has slightly limited range of motion with lateral rotation.  Thoracic and lumbar spine have good range of motion.  Shoulder joints, elbow joints, wrist joints, MCPs, PIPs, DIPs have good range of motion with no synovitis.  CMC, PIP, DIP thickening consistent with osteoarthritis  of both hands.  Thickening of MCP joints but no synovitis noted.  Hip joints have good range of motion with no groin pain.  Tenderness over bilateral trochanteric bursa.  Knee joints have good range of motion with no warmth or effusion.  Ankle joints have good range of motion with no tenderness or joint swelling.  CDAI Exam: CDAI Score: -- Patient Global: --; Provider Global: -- Swollen: --; Tender: -- Joint Exam 10/23/2023   No joint exam has been documented for this visit   There is currently no information documented on the homunculus. Go to the Rheumatology activity and complete the homunculus joint exam.  Investigation: No additional findings.  Imaging: No results found.  Recent Labs: Lab Results  Component Value Date   WBC 9.4 03/29/2023   HGB 10.7 (L) 03/29/2023   PLT 298 03/29/2023   NA 133 (L) 03/29/2023   K 4.9 03/29/2023   CL 103 03/29/2023   CO2 21 (L) 03/29/2023   GLUCOSE 195 (H) 03/29/2023   BUN 36 (H) 03/29/2023   CREATININE 1.16 (H) 03/29/2023   BILITOT 0.4 03/29/2023   ALKPHOS 98 03/29/2023   AST 21  03/29/2023   ALT 18 03/29/2023   PROT 7.0 03/29/2023   ALBUMIN 3.3 (L) 03/29/2023   CALCIUM 8.6 (L) 03/29/2023   GFRAA 40 (L) 01/27/2021   QFTBGOLDPLUS NEGATIVE 03/30/2019    Speciality Comments: PLQ Eye Exam:07/18/2023 WNL @ [facility] Follow up in 6 months   Procedures:  No procedures performed Allergies: Levofloxacin   Assessment / Plan:     Visit Diagnoses: Seronegative rheumatoid arthritis (HCC) - RF-,CCP-, 14-3-3 eta negative, Uric acid 4.8, ANA negative, no erosive changes on XR of hands: She has no joint tenderness or synovitis on examination today.  She has not had any signs or symptoms of a rheumatoid arthritis flare.  She has clinically been doing well taking Arava 20 mg 1 tablet by mouth daily and Plaquenil 200 mg 1 tablet by mouth daily Monday through Friday and half tablet on Saturday and Sundays.  She is tolerating combination therapy without any side effects and has not had any recent gaps in therapy.  No medication changes will be made at this time.  She was advised to notify us if she develops signs or symptoms of a flare.  She will follow-up in the office in 5 months or sooner if needed.  High risk medication use - Arava 20 mg 1 tablet by mouth daily, Plaquenil 200 mg 1 tablet daily Monday through Friday and half tab on Saturday and sundays.  CBC and CMP updated on 03/29/23.  Orders for CBC and CMP Released today.  Discussed the importance of holding arava if she develops signs or symptoms of an infection and to resume once the infection has completely cleared.   Lipid panel WNL on 06/08/23.  PLQ Eye Exam:07/18/2023 WNL @ [facility] Follow up in 6 months   - Plan: COMPLETE METABOLIC PANEL WITH GFR, CBC with Differential/Platelet  DDD (degenerative disc disease), cervical: C-spine has limited range of motion with lateral rotation.  No symptoms of radiculopathy at this time.  Osteopenia of multiple sites - DEXA on 02/18/18: The BMD measured at [facility] Right is  0.700 g/cm2 with a T-score of -2.4.  Overdue to update DEXA--future order remains in place.  Trochanteric bursitis of both hips: Patient presents today with pain on the lateral aspect of both hips consistent with trochanter bursitis.  She has difficulty laying on her sides  at night due to nocturnal pain.  Encouraged the patient to perform daily stretching exercises.  She was given a handout of these exercises to perform.  Offered a referral to physical therapy--she will notify us if she would like referral placed.  She is not a good candidate for cortisone injections due to diabetes.  Vitamin D deficiency: She is taking vitamin D 1000 units twice daily.  Other medical conditions are listed as follows:   Senile purpura (HCC)  History of chronic pancreatitis  Hypertension associated with diabetes (HCC): Blood pressure was 132/77 today in the office.  Hyperlipidemia associated with type 2 diabetes mellitus (HCC)  History of peptic ulcer disease  History of gastroesophageal reflux (GERD)  History of hypothyroidism  Former smoker    Orders: Orders Placed This Encounter  Procedures   COMPLETE METABOLIC PANEL WITH GFR   CBC with Differential/Platelet   No orders of the defined types were placed in this encounter.   Follow-Up Instructions: Return in about 5 months (around 03/21/2024) for Rheumatoid arthritis.   Gearldine Bienenstock, PA-C  Note - This record has been created using Dragon software.  Chart creation errors have been sought, but may not always  have been located. Such creation errors do not reflect on  the standard of medical care.

## 2023-10-12 ENCOUNTER — Other Ambulatory Visit: Payer: Self-pay | Admitting: Physician Assistant

## 2023-10-12 DIAGNOSIS — M0600 Rheumatoid arthritis without rheumatoid factor, unspecified site: Secondary | ICD-10-CM

## 2023-10-21 ENCOUNTER — Ambulatory Visit: Payer: 59 | Admitting: Physician Assistant

## 2023-10-23 ENCOUNTER — Encounter: Payer: Self-pay | Admitting: Physician Assistant

## 2023-10-23 ENCOUNTER — Ambulatory Visit: Payer: 59 | Attending: Physician Assistant | Admitting: Physician Assistant

## 2023-10-23 VITALS — BP 132/77 | HR 77 | Resp 14 | Ht 62.0 in | Wt 111.8 lb

## 2023-10-23 DIAGNOSIS — M0600 Rheumatoid arthritis without rheumatoid factor, unspecified site: Secondary | ICD-10-CM

## 2023-10-23 DIAGNOSIS — Z79899 Other long term (current) drug therapy: Secondary | ICD-10-CM

## 2023-10-23 DIAGNOSIS — E559 Vitamin D deficiency, unspecified: Secondary | ICD-10-CM

## 2023-10-23 DIAGNOSIS — Z8711 Personal history of peptic ulcer disease: Secondary | ICD-10-CM

## 2023-10-23 DIAGNOSIS — Z8639 Personal history of other endocrine, nutritional and metabolic disease: Secondary | ICD-10-CM

## 2023-10-23 DIAGNOSIS — Z87891 Personal history of nicotine dependence: Secondary | ICD-10-CM

## 2023-10-23 DIAGNOSIS — M5030 Other cervical disc degeneration, unspecified cervical region: Secondary | ICD-10-CM | POA: Diagnosis not present

## 2023-10-23 DIAGNOSIS — M8589 Other specified disorders of bone density and structure, multiple sites: Secondary | ICD-10-CM | POA: Diagnosis not present

## 2023-10-23 DIAGNOSIS — E785 Hyperlipidemia, unspecified: Secondary | ICD-10-CM

## 2023-10-23 DIAGNOSIS — Z8719 Personal history of other diseases of the digestive system: Secondary | ICD-10-CM

## 2023-10-23 DIAGNOSIS — M7062 Trochanteric bursitis, left hip: Secondary | ICD-10-CM

## 2023-10-23 DIAGNOSIS — E1169 Type 2 diabetes mellitus with other specified complication: Secondary | ICD-10-CM

## 2023-10-23 DIAGNOSIS — M7061 Trochanteric bursitis, right hip: Secondary | ICD-10-CM

## 2023-10-23 DIAGNOSIS — I152 Hypertension secondary to endocrine disorders: Secondary | ICD-10-CM

## 2023-10-23 DIAGNOSIS — D692 Other nonthrombocytopenic purpura: Secondary | ICD-10-CM

## 2023-10-23 DIAGNOSIS — E1159 Type 2 diabetes mellitus with other circulatory complications: Secondary | ICD-10-CM

## 2023-10-23 NOTE — Patient Instructions (Addendum)
 Standing Labs We placed an order today for your standing lab work.   Please have your standing labs drawn at the end of may and every 3 months   Please have your labs drawn 2 weeks prior to your appointment so that the provider can discuss your lab results at your appointment, if possible.  Please note that you may see your imaging and lab results in MyChart before we have reviewed them. We will contact you once all results are reviewed. Please allow our office up to 72 hours to thoroughly review all of the results before contacting the office for clarification of your results.  WALK-IN LAB HOURS  Monday through Thursday from 8:00 am -12:30 pm and 1:00 pm-5:00 pm and Friday from 8:00 am-12:00 pm.  Patients with office visits requiring labs will be seen before walk-in labs.  You may encounter longer than normal wait times. Please allow additional time. Wait times may be shorter on  Monday and Thursday afternoons.  We do not book appointments for walk-in labs. We appreciate your patience and understanding with our staff.   Labs are drawn by [facility]. Please bring your co-pay at the time of your lab draw.  You may receive a bill from [facility] for your lab work.  Please note if you are on Hydroxychloroquine and and an order has been placed for a Hydroxychloroquine level,  you will need to have it drawn 4 hours or more after your last dose.  If you wish to have your labs drawn at another location, please call the office 24 hours in advance so we can fax the orders.  The office is located at [address], [place], [place] [address]   If you have any questions regarding directions or hours of operation,  please call [phone].   As a reminder, please drink plenty of water prior to coming for your lab work. Thanks!   Hip Bursitis Rehab Ask your health care provider which exercises are safe for you. Do exercises exactly as told by your health care provider and adjust them as  directed. It is normal to feel mild stretching, pulling, tightness, or discomfort as you do these exercises. Stop right away if you feel sudden pain or your pain gets worse. Do not begin these exercises until told by your health care provider. Stretching exercise This exercise warms up your muscles and joints and improves the movement and flexibility of your hip. This exercise also helps to relieve pain and stiffness. Iliotibial band stretch An iliotibial band is a strong band of muscle tissue that runs from the outer side of your hip to the outer side of your thigh and knee. Lie on your side with your left / right leg in the top position. Bend your left / right knee and grab your ankle. Stretch out your bottom arm to help you balance. Slowly bring your knee back so your thigh is slightly behind your body. Slowly lower your knee toward the floor until you feel a gentle stretch on the outside of your left / right thigh. If you do not feel a stretch and your knee will not lower more toward the floor, place the heel of your other foot on top of your knee and pull your knee down toward the floor with your foot. Hold this position for __________ seconds. Slowly return to the starting position. Repeat __________ times. Complete this exercise __________ times a day. Strengthening exercises These exercises build strength and endurance in your hip and  pelvis. Endurance is the ability to use your muscles for a long time, even after they get tired. Bridge This exercise strengthens the muscles that move your thigh backward (hip extensors). Lie on your back on a firm surface with your knees bent and your feet flat on the floor. Tighten your buttocks muscles and lift your buttocks off the floor until your trunk is level with your thighs. Do not arch your back. You should feel the muscles working in your buttocks and the back of your thighs. If you do not feel these muscles, slide your feet 1-2 inches (2.5-5 cm)  farther away from your buttocks. If this exercise is too easy, try doing it with your arms crossed over your chest. Hold this position for __________ seconds. Slowly lower your hips to the starting position. Let your muscles relax completely after each repetition. Repeat __________ times. Complete this exercise __________ times a day. Squats This exercise strengthens the muscles in front of your thigh and knee (quadriceps). Stand in front of a table, with your feet and knees pointing straight ahead. You may rest your hands on the table for balance but not for support. Slowly bend your knees and lower your hips like you are going to sit in a chair. Keep your weight over your heels, not over your toes. Keep your lower legs upright so they are parallel with the table legs. Do not let your hips go lower than your knees. Do not bend lower than told by your health care provider. If your hip pain increases, do not bend as low. Hold the squat position for __________ seconds. Slowly push with your legs to return to standing. Do not use your hands to pull yourself to standing. Repeat __________ times. Complete this exercise __________ times a day. Hip hike  Stand sideways on a bottom step. Stand on your left / right leg with your other foot unsupported next to the step. You can hold on to the railing or wall for balance if needed. Keep your knees straight and your torso square. Then lift your left / right hip up toward the ceiling. Hold this position for __________ seconds. Slowly let your left / right hip lower toward the floor, past the starting position. Your foot should get closer to the floor. Do not lean or bend your knees. Repeat __________ times. Complete this exercise __________ times a day. Single leg stand This exercise increases your balance. Without shoes, stand near a railing or in a doorway. You may hold on to the railing or door frame as needed for balance. Squeeze your left / right  buttock muscles, then lift up your other foot. Do not let your left / right hip push out to the side. It is helpful to stand in front of a mirror for this exercise so you can watch your hip. Hold this position for __________ seconds. Repeat __________ times. Complete this exercise __________ times a day.  Iliotibial Band Syndrome Rehab Ask your health care provider which exercises are safe for you. Do exercises exactly as told by your provider and adjust them as told. It's normal to feel mild stretching, pulling, tightness, or discomfort as you do these exercises. Stop right away if you feel sudden pain or your pain gets a lot worse. Do not begin these exercises until told by your provider. Stretching and range-of-motion exercises These exercises warm up your muscles and joints. They also improve the movement and flexibility of your hip and pelvis. Quadriceps stretch, prone  Lie face down (prone) on a firm surface like a bed or padded floor. Bend your left / right knee. Reach back to hold your ankle or pant leg. If you can't reach your ankle or pant leg, use a belt looped around your foot and grab the belt instead. Gently pull your heel toward your butt. Your knee should not slide out to the side. You should feel a stretch in the front of your thigh and knee, also called the quadriceps. Hold this position for __________ seconds. Repeat __________ times. Complete this exercise __________ times a day. Iliotibial band stretch The iliotibial band is a strip of tissue that runs along the outside of your hip down to your knee. Lie on your side with your left / right leg on top. Bend both knees and grab your left / right ankle. Stretch out your bottom arm to help you balance. Slowly bring your top knee back so your thigh goes behind your back. Slowly lower your top leg toward the floor until you feel a gentle stretch on the outside of your left / right hip and thigh. If you don't feel a stretch and  your knee won't go farther, place the heel of your other foot on top of your knee and pull your knee down toward the floor with your foot. Hold this position for __________ seconds. Repeat __________ times. Complete this exercise __________ times a day. Strengthening exercises These exercises build strength and endurance in your hip and pelvis. Endurance means your muscles can keep working even when they're tired. Straight leg raises, side-lying This exercise strengthens the muscles that rotate the leg at the hip and move it away from your body. These muscles are called hip abductors. Lie on your side with your left / right leg on top. Lie so your head, shoulder, hip, and knee line up. You can bend your bottom knee to help you balance. Roll your hips slightly forward so they're stacked directly over each other. Your left / right knee should face forward. Tense the muscles in your outer thigh and hip. Lift your top leg 4-6 inches (10-15 cm) off the ground. Hold this position for __________ seconds. Slowly lower your leg back down to the starting position. Let your muscles fully relax before doing this exercise again. Repeat __________ times. Complete this exercise __________ times a day. Leg raises, prone This exercise strengthens the muscles that move the hips backward. These muscles are called hip extensors. Lie face down (prone) on your bed or a firm surface. You can put a pillow under your hips for comfort and to support your lower back. Bend your left / right knee so your foot points straight up toward the ceiling. Keep the other leg straight and behind you. Squeeze your butt muscles. Lift your left / right thigh off the firm surface. Do not let your back arch. Tense your thigh muscle as hard as you can without having more knee pain. Hold this position for __________ seconds. Slowly lower your leg to the starting position. Allow your leg to relax all the way. Repeat __________ times.  Complete this exercise __________ times a day. Hip hike  Stand sideways on a bottom step. Place your feet so that your left / right leg is on the step, and the other foot is hanging off the side. If you need support for balance, hold onto a railing or wall. Keep your knees straight and your abdomen square, meaning your hips are level. Then, lift your  left / right hip up toward the ceiling. Slowly let your leg that's hanging off the step lower towards the floor. Your foot should get closer to the ground. Do not lean or bend your knees during this movement. Repeat __________ times. Complete this exercise __________ times a day. This information is not intended to replace advice given to you by your health care provider. Make sure you discuss any questions you have with your health care provider. Document Revised: 10/26/2022 Document Reviewed: 10/26/2022 Elsevier Patient Education  2024 [employer].

## 2023-10-24 LAB — COMPLETE METABOLIC PANEL WITH GFR
AG Ratio: 1.2 (calc) (ref 1.0–2.5)
ALT: 13 U/L (ref 6–29)
AST: 17 U/L (ref 10–35)
Albumin: 3.9 g/dL (ref 3.6–5.1)
Alkaline phosphatase (APISO): 133 U/L (ref 37–153)
BUN/Creatinine Ratio: 20 (calc) (ref 6–22)
BUN: 20 mg/dL (ref 7–25)
CO2: 24 mmol/L (ref 20–32)
Calcium: 9.6 mg/dL (ref 8.6–10.4)
Chloride: 102 mmol/L (ref 98–110)
Creat: 0.99 mg/dL — ABNORMAL HIGH (ref 0.60–0.95)
Globulin: 3.2 g/dL (ref 1.9–3.7)
Glucose, Bld: 169 mg/dL — ABNORMAL HIGH (ref 65–99)
Potassium: 5.9 mmol/L — ABNORMAL HIGH (ref 3.5–5.3)
Sodium: 136 mmol/L (ref 135–146)
Total Bilirubin: 0.3 mg/dL (ref 0.2–1.2)
Total Protein: 7.1 g/dL (ref 6.1–8.1)
eGFR: 58 mL/min/{1.73_m2} — ABNORMAL LOW (ref >=60)

## 2023-10-24 LAB — CBC WITH DIFFERENTIAL/PLATELET
Absolute Lymphocytes: 2104 {cells}/uL (ref 850–3900)
Absolute Monocytes: 905 {cells}/uL (ref 200–950)
Basophils Absolute: 20 {cells}/uL (ref 0–200)
Basophils Relative: 0.3 %
Eosinophils Absolute: 288 {cells}/uL (ref 15–500)
Eosinophils Relative: 4.3 %
HCT: 35.8 % (ref 35.0–45.0)
Hemoglobin: 11.3 g/dL — ABNORMAL LOW (ref 11.7–15.5)
MCH: 28.5 pg (ref 27.0–33.0)
MCHC: 31.6 g/dL — ABNORMAL LOW (ref 32.0–36.0)
MCV: 90.4 fL (ref 80.0–100.0)
MPV: 9.9 fL (ref 7.5–12.5)
Monocytes Relative: 13.5 %
Neutro Abs: 3384 {cells}/uL (ref 1500–7800)
Neutrophils Relative %: 50.5 %
Platelets: 304 10*3/uL (ref 140–400)
RBC: 3.96 10*6/uL (ref 3.80–5.10)
RDW: 13 % (ref 11.0–15.0)
Total Lymphocyte: 31.4 %
WBC: 6.7 10*3/uL (ref 3.8–10.8)

## 2023-10-24 NOTE — Progress Notes (Signed)
 Patient should reach out to PCP to hold supplement and to determine when potassium should be rechecked

## 2023-10-24 NOTE — Progress Notes (Signed)
 Hemoglobin remains low but has improved. Rest of CBC stable.  Potassium is elevated-5.9.  please clarify if she is taking a potassium supplement? Any medication changes recently? Recommend rechecking potassium.  Creatinine remains elevated but has improved.

## 2023-11-08 ENCOUNTER — Other Ambulatory Visit: Payer: Self-pay

## 2023-11-08 DIAGNOSIS — M0600 Rheumatoid arthritis without rheumatoid factor, unspecified site: Secondary | ICD-10-CM

## 2023-11-08 NOTE — Telephone Encounter (Signed)
 Patient called and requested refill of PLQ to [facility].  Last Fill: 08/15/2023  Eye exam: 07/18/2023   Labs: 10/23/2023 Hemoglobin remains low but has improved. Rest of CBC stable. Potassium is elevated-5.9.  please clarify if she is taking a potassium supplement? Any medication changes recently? Recommend rechecking potassium. Creatinine remains elevated but has improved.  Next Visit: 03/19/2024  Last Visit: 10/23/2023  [id number] rheumatoid arthritis   Current Dose per office note on 10/23/2023: Plaquenil 200 mg 1 tablet daily Monday through Friday and half tab on Saturday and sundays.   Okay to refill Plaquenil?

## 2023-11-10 MED ORDER — HYDROXYCHLOROQUINE SULFATE 200 MG PO TABS: ORAL_TABLET | ORAL | 0 refills | Status: DC

## 2023-11-25 ENCOUNTER — Other Ambulatory Visit: Payer: Self-pay | Admitting: *Deleted

## 2023-11-25 DIAGNOSIS — Z79899 Other long term (current) drug therapy: Secondary | ICD-10-CM

## 2023-11-25 DIAGNOSIS — M0600 Rheumatoid arthritis without rheumatoid factor, unspecified site: Secondary | ICD-10-CM

## 2023-11-25 MED ORDER — LEFLUNOMIDE 20 MG PO TABS: 20.0000 mg | ORAL_TABLET | Freq: Every day | ORAL | 0 refills | Status: DC

## 2023-11-25 NOTE — Telephone Encounter (Signed)
 Patient contacted the office and requested Arava to be sent to the [facility].   Last Fill: 04/22/2023  Labs: 10/23/2023 Hemoglobin remains low but has improved. Rest of CBC stable. Potassium is elevated-5.9.  Creatinine remains elevated but has improved.   Next Visit: 03/19/2024  Last Visit: 10/23/2023  DX: Seronegative rheumatoid arthritis   Current Dose per office note 10/22/2022: Arava 20 mg 1 tablet by mouth daily   Okay to refill Arava ?

## 2024-01-07 ENCOUNTER — Other Ambulatory Visit: Payer: Self-pay | Admitting: Physician Assistant

## 2024-01-07 DIAGNOSIS — M0600 Rheumatoid arthritis without rheumatoid factor, unspecified site: Secondary | ICD-10-CM

## 2024-01-07 NOTE — Telephone Encounter (Signed)
 Last Fill: 11/10/2023  Eye exam: 07/18/2023 WNL   Labs: 10/23/2023 Hemoglobin remains low but has improved. Rest of CBC stable. Potassium is elevated-5.9.  Next Visit: 03/19/2024  Last Visit: 10/23/2023  [id number] rheumatoid arthritis   Current Dose per office note 10/23/2023: Plaquenil  200 mg 1 tablet by mouth daily Monday through Friday and half tablet on Saturday and Sundays   Patient advised that labs need to be updated.   Okay to refill Plaquenil ?

## 2024-01-09 ENCOUNTER — Other Ambulatory Visit: Payer: Self-pay | Admitting: *Deleted

## 2024-01-09 DIAGNOSIS — E1169 Type 2 diabetes mellitus with other specified complication: Secondary | ICD-10-CM

## 2024-01-09 DIAGNOSIS — Z79899 Other long term (current) drug therapy: Secondary | ICD-10-CM

## 2024-01-09 LAB — CBC WITH DIFFERENTIAL/PLATELET
Absolute Lymphocytes: 1982 {cells}/uL (ref 850–3900)
Absolute Monocytes: 806 {cells}/uL (ref 200–950)
Basophils Absolute: 42 {cells}/uL (ref 0–200)
Basophils Relative: 0.5 %
Eosinophils Absolute: 504 {cells}/uL — ABNORMAL HIGH (ref 15–500)
Eosinophils Relative: 6 %
HCT: 36.1 % (ref 35.0–45.0)
Hemoglobin: 11.1 g/dL — ABNORMAL LOW (ref 11.7–15.5)
MCH: 29.4 pg (ref 27.0–33.0)
MCHC: 30.7 g/dL — ABNORMAL LOW (ref 32.0–36.0)
MCV: 95.5 fL (ref 80.0–100.0)
MPV: 9.8 fL (ref 7.5–12.5)
Monocytes Relative: 9.6 %
Neutro Abs: 5065 {cells}/uL (ref 1500–7800)
Neutrophils Relative %: 60.3 %
Platelets: 319 10*3/uL (ref 140–400)
RBC: 3.78 10*6/uL — ABNORMAL LOW (ref 3.80–5.10)
RDW: 13.8 % (ref 11.0–15.0)
Total Lymphocyte: 23.6 %
WBC: 8.4 10*3/uL (ref 3.8–10.8)

## 2024-01-09 LAB — LIPID PANEL
Cholesterol: 121 mg/dL (ref <200)
HDL: 64 mg/dL (ref >=50)
LDL Cholesterol (Calc): 41 mg/dL
Non-HDL Cholesterol (Calc): 57 mg/dL (ref <130)
Total CHOL/HDL Ratio: 1.9 (calc) (ref <5.0)
Triglycerides: 84 mg/dL (ref <150)

## 2024-01-09 LAB — COMPREHENSIVE METABOLIC PANEL WITH GFR
AG Ratio: 1.3 (calc) (ref 1.0–2.5)
ALT: 23 U/L (ref 6–29)
AST: 22 U/L (ref 10–35)
Albumin: 3.8 g/dL (ref 3.6–5.1)
Alkaline phosphatase (APISO): 195 U/L — ABNORMAL HIGH (ref 37–153)
BUN/Creatinine Ratio: 37 (calc) — ABNORMAL HIGH (ref 6–22)
BUN: 37 mg/dL — ABNORMAL HIGH (ref 7–25)
CO2: 22 mmol/L (ref 20–32)
Calcium: 9.3 mg/dL (ref 8.6–10.4)
Chloride: 104 mmol/L (ref 98–110)
Creat: 1 mg/dL — ABNORMAL HIGH (ref 0.60–0.95)
Globulin: 3 g/dL (ref 1.9–3.7)
Glucose, Bld: 240 mg/dL — ABNORMAL HIGH (ref 65–99)
Potassium: 4.9 mmol/L (ref 3.5–5.3)
Sodium: 135 mmol/L (ref 135–146)
Total Bilirubin: 0.3 mg/dL (ref 0.2–1.2)
Total Protein: 6.8 g/dL (ref 6.1–8.1)
eGFR: 57 mL/min/{1.73_m2} — ABNORMAL LOW (ref >=60)

## 2024-01-10 ENCOUNTER — Ambulatory Visit: Payer: Self-pay | Admitting: Rheumatology

## 2024-01-10 NOTE — Progress Notes (Signed)
 Lipid panel is normal.  Glucose is elevated, creatinine stable.  Hemoglobin is low and stable.

## 2024-01-26 ENCOUNTER — Other Ambulatory Visit: Payer: Self-pay | Admitting: Physician Assistant

## 2024-01-26 DIAGNOSIS — M0600 Rheumatoid arthritis without rheumatoid factor, unspecified site: Secondary | ICD-10-CM

## 2024-01-27 NOTE — Telephone Encounter (Signed)
 Last Fill: 11/25/2023  Labs: 01/09/2024 Lipid panel is normal.  Glucose is elevated, creatinine stable.  Hemoglobin is low and stable.   Next Visit: 03/19/2024  Last Visit: 10/23/2023  DX: Seronegative rheumatoid arthritis (HCC)   Current Dose per office note 10/23/2023: Arava  20 mg 1 tablet by mouth daily   Okay to refill Arava  ?

## 2024-02-03 ENCOUNTER — Telehealth: Payer: Self-pay

## 2024-02-03 DIAGNOSIS — M0600 Rheumatoid arthritis without rheumatoid factor, unspecified site: Secondary | ICD-10-CM

## 2024-02-03 NOTE — Telephone Encounter (Signed)
 Patient called requesting refill on Hydroxychloroquine . Verified she uses [facility]. Patient will check with pharmacy in 24-48 hours or await a call back from clinical staff if there are any issues with this refill request.  [phone]

## 2024-02-03 NOTE — Telephone Encounter (Signed)
 Contacted the patient and advised Hydroxychloroquine  was sent to [facility] on 01/07/2024. Patient states she will contact her pharmacy and call us  back if she has any other questions.

## 2024-03-02 LAB — OPHTHALMOLOGY REPORT-SCANNED

## 2024-03-04 ENCOUNTER — Other Ambulatory Visit: Payer: Self-pay

## 2024-03-04 DIAGNOSIS — M0600 Rheumatoid arthritis without rheumatoid factor, unspecified site: Secondary | ICD-10-CM

## 2024-03-04 MED ORDER — LEFLUNOMIDE 20 MG PO TABS: 20.0000 mg | ORAL_TABLET | Freq: Every day | ORAL | 0 refills | Status: DC

## 2024-03-04 NOTE — Telephone Encounter (Signed)
 Patient contacted the office and states that [facility] contacted her and advised they are out of stock and on backorder for her Leflunomide . Patient inquired if we could send her prescription to [facility] in [place] instead. Contacted [facility] and cancelled the prescription, Belvie from [facility] states the patient still had a full 90 day supply. Please review and sign.

## 2024-03-05 NOTE — Progress Notes (Unsigned)
 Office Visit Note  Patient: Deborah Travis             Date of Birth: 1940-10-24           MRN: [id number]             PCP: Samie Frederick, PA-C Referring: Samie Frederick, PA-C Visit Date: 03/19/2024 Occupation: @GUAROCC @  Subjective:  Medication monitoring  History of Present Illness: Deborah Travis is a 83 y.o. female with history of seronegative rheumatoid arthritis and osteoarthritis.  Patient remains on Arava  20 mg 1 tablet by mouth daily and Plaquenil  200 mg 1 tablet daily Monday through Friday and half tab on Saturday and sundays.   She is tolerating combination therapy without any side effects and has not had any gaps in therapy.  She denies any signs or symptoms of a rheumatoid arthritis flare.  Patient states that her hip pain has improved since performing daily stretching exercises.  Patient states that she continues to have some weakness in her hands as well as difficulty opening jars and bottles.  She denies any joint swelling at this time. She denies any new medical conditions.  She denies any recent or recurrent infections.    Activities of Daily Living:  Patient reports morning stiffness for 0 minutes.   Patient Denies nocturnal pain.  Difficulty dressing/grooming: Denies Difficulty climbing stairs: Denies Difficulty getting out of chair: Denies Difficulty using hands for taps, buttons, cutlery, and/or writing: Reports  Review of Systems  Constitutional:  Negative for fatigue.  HENT:  Negative for mouth sores and mouth dryness.   Eyes:  Negative for dryness.  Respiratory:  Negative for shortness of breath.   Cardiovascular:  Negative for chest pain and palpitations.  Gastrointestinal:  Negative for blood in stool, constipation and diarrhea.  Endocrine: Negative for increased urination.  Genitourinary:  Negative for involuntary urination.  Musculoskeletal:  Negative for joint pain, gait problem, joint pain, joint swelling, myalgias, muscle weakness, morning stiffness,  muscle tenderness and myalgias.  Skin:  Positive for hair loss. Negative for color change, rash and sensitivity to sunlight.  Allergic/Immunologic: Negative for susceptible to infections.  Neurological:  Negative for dizziness and headaches.  Hematological:  Negative for swollen glands.  Psychiatric/Behavioral:  Negative for depressed mood and sleep disturbance. The patient is not nervous/anxious.     PMFS History:  Patient Active Problem List   Diagnosis Date Noted   Thickened endometrium 04/10/2023   Aortic atherosclerosis (HCC) 06/23/2021   Diabetic nephropathy associated with type 2 diabetes mellitus (HCC) 02/06/2021   Stage 3b chronic kidney disease (HCC) 02/06/2021   Chronic calcific pancreatitis (HCC) 08/12/2020   Atherosclerosis 08/08/2018   Glaucoma 05/26/2018   Hypertensive retinopathy 05/26/2018   Seronegative rheumatoid arthritis (HCC) 03/04/2018   Senile purpura (HCC) 02/01/2015   Former smoker 10/12/2013   History of peptic ulcer disease 07/13/2013   Osteopenia 05/07/2013   Hypothyroidism 01/12/2012   Type 2 diabetes with complication (HCC) 05/21/2011   Hypertension associated with diabetes (HCC) 05/21/2011   Exocrine pancreatic insufficiency 05/21/2011   Hyperlipidemia associated with type 2 diabetes mellitus (HCC) 05/21/2011    Past Medical History:  Diagnosis Date   Arthritis    Chronic kidney disease    RENAL STONE   Chronic pancreatitis (HCC)    Complication of anesthesia    slow to awaken   Diabetes mellitus Type 2    Duodenal ulcer    Dyslipidemia    GERD (gastroesophageal reflux disease)    Hypertension  Osteopenia    Thyroid  disease    HYPOTHYROID   Vitamin D  deficiency    Wears hearing aid in both ears     Family History  Problem Relation Age of Onset   Stroke Mother    Heart disease Father    Healthy Daughter    Healthy Daughter    Emphysema Daughter    Diabetes Daughter    Hernia Daughter    Past Surgical History:  Procedure  Laterality Date   APPENDECTOMY     yrs ago, as child   CATARACT EXTRACTION Right 08/27/2012   CHOLECYSTECTOMY     yrs ago   HYSTEROSCOPY WITH D & C N/A 02/14/2023   Procedure: DILATATION AND CURETTAGE;  Surgeon: Sudie Lavonia HERO, MD;  Location: [facility];  Service: Gynecology;  Laterality: N/A;   ROBOTIC ASSISTED TOTAL HYSTERECTOMY WITH BILATERAL SALPINGO OOPHERECTOMY N/A 04/10/2023   Procedure: XI ROBOTIC ASSISTED TOTAL HYSTERECTOMY WITH BILATERAL SALPINGO OOPHORECTOMY;  Surgeon: Viktoria Comer SAUNDERS, MD;  Location: [facility];  Service: Gynecology;  Laterality: N/A;   TONSILLECTOMY AND ADENOIDECTOMY     as child   TUBAL LIGATION     age 68   WHIPPLE PROCEDURE     1998 or 26   Social History   Social History Narrative   Not on file   Immunization History  Administered Date(s) Administered   Fluad Quad(high Dose 65+) 06/01/2019, 05/10/2020, 06/23/2021, 05/04/2022   Influenza Split 05/21/2011, 06/12/2012, 07/15/2013   Influenza, High Dose Seasonal PF 07/13/2013, 07/01/2014, 06/01/2015, 06/15/2016, 04/15/2017, 05/19/2018   PFIZER Comirnaty(Gray Top)Covid-19 Tri-Sucrose Vaccine 01/25/2021   PFIZER(Purple Top)SARS-COV-2 Vaccination 09/18/2019, 10/09/2019   Pneumococcal Conjugate-13 06/15/2016   Pneumococcal Polysaccharide-23 06/12/2012   Tdap 06/28/2011   Zoster, Live 06/27/2013     Objective: Vital Signs: BP 93/60 (BP Location: Left Arm, Patient Position: Sitting, Cuff Size: Normal)   Pulse 80   Resp 15   Ht 5' 2 (1.575 m)   Wt 110 lb 4.8 oz (50 kg)   LMP  (LMP Unknown)   BMI 20.17 kg/m    Physical Exam Vitals and nursing note reviewed.  Constitutional:      Appearance: She is well-developed.  HENT:     Head: Normocephalic and atraumatic.  Eyes:     Conjunctiva/sclera: Conjunctivae normal.  Cardiovascular:     Rate and Rhythm: Normal rate and regular rhythm.     Heart sounds: Normal heart sounds.  Pulmonary:     Effort: Pulmonary effort is normal.      Breath sounds: Normal breath sounds.  Abdominal:     General: Bowel sounds are normal.     Palpations: Abdomen is soft.  Musculoskeletal:     Cervical back: Normal range of motion.  Lymphadenopathy:     Cervical: No cervical adenopathy.  Skin:    General: Skin is warm and dry.     Capillary Refill: Capillary refill takes less than 2 seconds.  Neurological:     Mental Status: She is alert and oriented to person, place, and time.  Psychiatric:        Behavior: Behavior normal.      Musculoskeletal Exam: C-spine has slightly limited range of motion with lateral rotation.  Thoracic and lumbar spine good range of motion.  Shoulder joints, elbow joints, wrist joints have good range of motion with no synovitis.  CMC, PIP, DIP thickening consistent with osteoarthritis of both hands.  Thickening and subluxation of bilateral first MCP joints.  No tenderness or synovitis over MCP  joints.  Hip joints have good range of motion with no groin pain.  No tenderness over the trochanteric bursa at this time.  Knee joints have good range of motion no warmth or effusion.  Ankle joints have good range of motion with no tenderness or joint swelling.  CDAI Exam: CDAI Score: -- Patient Global: --; Provider Global: -- Swollen: --; Tender: -- Joint Exam 03/19/2024   No joint exam has been documented for this visit   There is currently no information documented on the homunculus. Go to the Rheumatology activity and complete the homunculus joint exam.  Investigation: No additional findings.  Imaging: No results found.  Recent Labs: Lab Results  Component Value Date   WBC 8.4 01/09/2024   HGB 11.1 (L) 01/09/2024   PLT 319 01/09/2024   NA 135 01/09/2024   K 4.9 01/09/2024   CL 104 01/09/2024   CO2 22 01/09/2024   GLUCOSE 240 (H) 01/09/2024   BUN 37 (H) 01/09/2024   CREATININE 1.00 (H) 01/09/2024   BILITOT 0.3 01/09/2024   ALKPHOS 98 03/29/2023   AST 22 01/09/2024   ALT 23 01/09/2024   PROT  6.8 01/09/2024   ALBUMIN 3.3 (L) 03/29/2023   CALCIUM  9.3 01/09/2024   GFRAA 40 (L) 01/27/2021   QFTBGOLDPLUS NEGATIVE 03/30/2019    Speciality Comments: PLQ Eye Exam:07/18/2023 WNL @ [facility] Follow up in 6 months   Procedures:  No procedures performed Allergies: Levofloxacin  and Codeine    Assessment / Plan:     Visit Diagnoses: Seronegative rheumatoid arthritis (HCC) - RF-,CCP-, 14-3-3 eta negative, Uric acid 4.8, ANA negative, no erosive changes on XR of hands: She has no synovitis on examination today.  She has not had any signs or symptoms of a rheumatoid arthritis flare.  She has clinically been doing well taking Arava  20 mg 1 tablet by mouth daily and Plaquenil  200 mg 1 tablet daily Monday through Friday and half tablet on Saturday and Sundays.  She is tolerating combination therapy without any side effects and has not had any gaps in therapy.  She has CMC, PIP, DIP thickening consistent with osteoarthritis of both hands.  Subluxation and thickening of bilateral first MCP joints but no synovitis was noted.  She has difficulty opening jars and bottles as well as with grip strength.  Discussed the importance of joint protection and muscle strengthening.  She was given a handout of hand exercises to perform.  She will notify us  if she develops any new or worsening symptoms.  No medication changes will be made at this time.  She will follow-up in the office in 5 months or sooner if needed.  High risk medication use - Arava  20 mg 1 tablet by mouth daily, Plaquenil  200 mg 1 tablet daily Monday through Friday and half tab on Saturday and sundays. CBC and CMP updated on 01/09/24.  Future orders for CBC and CMP replaced today.  Patient plans on returning for updated lab work in mid August and every 3 months to monitor for drug toxicity. Lipid panel updated 01/09/24.  PLQ Eye Exam:07/18/2023 WNL @ [facility] Follow up in 6 months  No recent or recurrent infections.  Discussed the  importance of holding arava  if she develops signs or symptoms of an infection and to resume once the infection has completely cleared.   - Plan: CBC with Differential/Platelet, Comprehensive metabolic panel with GFR  Trochanteric bursitis of both hips: Improved.  She has been performing daily exercises which have been  helpful.  She has no tenderness upon palpation today.  DDD (degenerative disc disease), cervical: C-spine has slightly limited range of motion with lateral rotation.  No symptoms of radiculopathy at this time.  Osteopenia of multiple sites - DEXA on 02/18/18: The BMD measured at [facility] Right is 0.700 g/cm2 with a T-score of -2.4.  Overdue to update DEXA--future order remains in place.  Patient has not yet scheduled an updated bone density but was reminded to do so.  Vitamin D  deficiency: She is taking vitamin D  2000 units daily.  Other medical conditions are listed as follows:  Hyperlipidemia associated with type 2 diabetes mellitus (HCC): Lipid panel within normal limits on 01/09/2024.  Hypertension associated with diabetes (HCC): Blood pressure was 93/60 today in the office.  History of chronic pancreatitis  Senile purpura (HCC)  History of peptic ulcer disease  History of gastroesophageal reflux (GERD)  History of hypothyroidism  Former smoker  Orders: Orders Placed This Encounter  Procedures   CBC with Differential/Platelet   Comprehensive metabolic panel with GFR   No orders of the defined types were placed in this encounter.    Follow-Up Instructions: Return in about 5 months (around 08/19/2024) for Rheumatoid arthritis.   Deborah CHRISTELLA Craze, PA-C  Note - This record has been created using Dragon software.  Chart creation errors have been sought, but may not always  have been located. Such creation errors do not reflect on  the standard of medical care.

## 2024-03-19 ENCOUNTER — Ambulatory Visit: Payer: 59 | Attending: Physician Assistant | Admitting: Physician Assistant

## 2024-03-19 ENCOUNTER — Encounter: Payer: Self-pay | Admitting: Physician Assistant

## 2024-03-19 VITALS — BP 93/60 | HR 80 | Resp 15 | Ht 62.0 in | Wt 110.3 lb

## 2024-03-19 DIAGNOSIS — M7061 Trochanteric bursitis, right hip: Secondary | ICD-10-CM | POA: Diagnosis not present

## 2024-03-19 DIAGNOSIS — M0600 Rheumatoid arthritis without rheumatoid factor, unspecified site: Secondary | ICD-10-CM | POA: Diagnosis not present

## 2024-03-19 DIAGNOSIS — M5030 Other cervical disc degeneration, unspecified cervical region: Secondary | ICD-10-CM | POA: Diagnosis not present

## 2024-03-19 DIAGNOSIS — E559 Vitamin D deficiency, unspecified: Secondary | ICD-10-CM

## 2024-03-19 DIAGNOSIS — E1159 Type 2 diabetes mellitus with other circulatory complications: Secondary | ICD-10-CM

## 2024-03-19 DIAGNOSIS — E1169 Type 2 diabetes mellitus with other specified complication: Secondary | ICD-10-CM

## 2024-03-19 DIAGNOSIS — M7062 Trochanteric bursitis, left hip: Secondary | ICD-10-CM

## 2024-03-19 DIAGNOSIS — M8589 Other specified disorders of bone density and structure, multiple sites: Secondary | ICD-10-CM

## 2024-03-19 DIAGNOSIS — Z79899 Other long term (current) drug therapy: Secondary | ICD-10-CM | POA: Diagnosis not present

## 2024-03-19 DIAGNOSIS — I152 Hypertension secondary to endocrine disorders: Secondary | ICD-10-CM

## 2024-03-19 DIAGNOSIS — E785 Hyperlipidemia, unspecified: Secondary | ICD-10-CM

## 2024-03-19 DIAGNOSIS — D692 Other nonthrombocytopenic purpura: Secondary | ICD-10-CM

## 2024-03-19 DIAGNOSIS — Z8639 Personal history of other endocrine, nutritional and metabolic disease: Secondary | ICD-10-CM

## 2024-03-19 DIAGNOSIS — Z87891 Personal history of nicotine dependence: Secondary | ICD-10-CM

## 2024-03-19 DIAGNOSIS — Z8719 Personal history of other diseases of the digestive system: Secondary | ICD-10-CM

## 2024-03-19 DIAGNOSIS — Z8711 Personal history of peptic ulcer disease: Secondary | ICD-10-CM

## 2024-03-19 NOTE — Patient Instructions (Signed)
 Standing Labs We placed an order today for your standing lab work.   Please have your standing labs drawn in Mid-August and every 3 months   Please have your labs drawn 2 weeks prior to your appointment so that the provider can discuss your lab results at your appointment, if possible.  Please note that you may see your imaging and lab results in MyChart before we have reviewed them. We will contact you once all results are reviewed. Please allow our office up to 72 hours to thoroughly review all of the results before contacting the office for clarification of your results.  WALK-IN LAB HOURS  Monday through Thursday from 8:00 am -12:30 pm and 1:00 pm-4:30 pm and Friday from 8:00 am-12:00 pm.  Patients with office visits requiring labs will be seen before walk-in labs.  You may encounter longer than normal wait times. Please allow additional time. Wait times may be shorter on  Monday and Thursday afternoons.  We do not book appointments for walk-in labs. We appreciate your patience and understanding with our staff.   Labs are drawn by [facility]. Please bring your co-pay at the time of your lab draw.  You may receive a bill from [facility] for your lab work.  Please note if you are on Hydroxychloroquine  and and an order has been placed for a Hydroxychloroquine  level,  you will need to have it drawn 4 hours or more after your last dose.  If you wish to have your labs drawn at another location, please call the office 24 hours in advance so we can fax the orders.  The office is located at [address], [address], [place], [place] [address]   If you have any questions regarding directions or hours of operation,  please call [phone].   As a reminder, please drink plenty of water  prior to coming for your lab work. Thanks!  Hand Exercises Hand exercises can be helpful for almost anyone. They can strengthen your hands and improve flexibility and movement. The exercises can also increase blood  flow to the hands. These results can make your work and daily tasks easier for you. Hand exercises can be especially helpful for people who have joint pain from arthritis or nerve damage from using their hands over and over. These exercises can also help people who injure a hand. Exercises Most of these hand exercises are gentle stretching and motion exercises. It is usually safe to do them often throughout the day. Warming up your hands before exercise may help reduce stiffness. You can do this with gentle massage or by placing your hands in warm water  for 10-15 minutes. It is normal to feel some stretching, pulling, tightness, or mild discomfort when you begin new exercises. In time, this will improve. Remember to always be careful and stop right away if you feel sudden, very bad pain or your pain gets worse. You want to get better and be safe. Ask your health care provider which exercises are safe for you. Do exercises exactly as told by your provider and adjust them as told. Do not begin these exercises until told by your provider. Knuckle bend or claw fist  Stand or sit with your arm, hand, and all five fingers pointed straight up. Make sure to keep your wrist straight. Gently bend your fingers down toward your palm until the tips of your fingers are touching your palm. Keep your big knuckle straight and only bend the small knuckles in your fingers. Hold this position for  10 seconds. Straighten your fingers back to your starting position. Repeat this exercise 5-10 times with each hand. Full finger fist  Stand or sit with your arm, hand, and all five fingers pointed straight up. Make sure to keep your wrist straight. Gently bend your fingers into your palm until the tips of your fingers are touching the middle of your palm. Hold this position for 10 seconds. Extend your fingers back to your starting position, stretching every joint fully. Repeat this exercise 5-10 times with each  hand. Straight fist  Stand or sit with your arm, hand, and all five fingers pointed straight up. Make sure to keep your wrist straight. Gently bend your fingers at the big knuckle, where your fingers meet your hand, and at the middle knuckle. Keep the knuckle at the tips of your fingers straight and try to touch the bottom of your palm. Hold this position for 10 seconds. Extend your fingers back to your starting position, stretching every joint fully. Repeat this exercise 5-10 times with each hand. Tabletop  Stand or sit with your arm, hand, and all five fingers pointed straight up. Make sure to keep your wrist straight. Gently bend your fingers at the big knuckle, where your fingers meet your hand, as far down as you can. Keep the small knuckles in your fingers straight. Think of forming a tabletop with your fingers. Hold this position for 10 seconds. Extend your fingers back to your starting position, stretching every joint fully. Repeat this exercise 5-10 times with each hand. Finger spread  Place your hand flat on a table with your palm facing down. Make sure your wrist stays straight. Spread your fingers and thumb apart from each other as far as you can until you feel a gentle stretch. Hold this position for 10 seconds. Bring your fingers and thumb tight together again. Hold this position for 10 seconds. Repeat this exercise 5-10 times with each hand. Making circles  Stand or sit with your arm, hand, and all five fingers pointed straight up. Make sure to keep your wrist straight. Make a circle by touching the tip of your thumb to the tip of your index finger. Hold for 10 seconds. Then open your hand wide. Repeat this motion with your thumb and each of your fingers. Repeat this exercise 5-10 times with each hand. Thumb motion  Sit with your forearm resting on a table and your wrist straight. Your thumb should be facing up toward the ceiling. Keep your fingers relaxed as you move  your thumb. Lift your thumb up as high as you can toward the ceiling. Hold for 10 seconds. Bend your thumb across your palm as far as you can, reaching the tip of your thumb for the small finger (pinkie) side of your palm. Hold for 10 seconds. Repeat this exercise 5-10 times with each hand. Grip strengthening  Hold a stress ball or other soft ball in the middle of your hand. Slowly increase the pressure, squeezing the ball as much as you can without causing pain. Think of bringing the tips of your fingers into the middle of your palm. All of your finger joints should bend when doing this exercise. Hold your squeeze for 10 seconds, then relax. Repeat this exercise 5-10 times with each hand. Contact a health care provider if: Your hand pain or discomfort gets much worse when you do an exercise. Your hand pain or discomfort does not improve within 2 hours after you exercise. If you have either of  these problems, stop doing these exercises right away. Do not do them again unless your provider says that you can. Get help right away if: You develop sudden, severe hand pain or swelling. If this happens, stop doing these exercises right away. Do not do them again unless your provider says that you can. This information is not intended to replace advice given to you by your health care provider. Make sure you discuss any questions you have with your health care provider. Document Revised: 08/28/2022 Document Reviewed: 08/28/2022 Elsevier Patient Education  2024 [employer].

## 2024-04-01 ENCOUNTER — Other Ambulatory Visit: Payer: Self-pay | Admitting: Physician Assistant

## 2024-04-01 DIAGNOSIS — M0600 Rheumatoid arthritis without rheumatoid factor, unspecified site: Secondary | ICD-10-CM

## 2024-04-01 NOTE — Telephone Encounter (Signed)
 Last Fill: 01/07/2024  Eye exam: 07/18/2023 WNL   Labs: 01/09/2024 RBC 3.78, Hgb 11.1, MCHC 30.7, Eosinophils Absolute 504, Glucose 240, BUN 1.00, GFR 57, BUN/Creat. Ratio 37, Alk. Phos 195  Next Visit: 08/24/2024  Last Visit: 03/19/2024  IK:Dzmnwzhjupcz rheumatoid arthritis   Current Dose per office note 03/19/2024: Plaquenil  200 mg 1 tablet daily Monday through Friday and half tab on Saturday and sundays.   Okay to refill Plaquenil ?

## 2024-05-30 ENCOUNTER — Other Ambulatory Visit: Payer: Self-pay | Admitting: Physician Assistant

## 2024-05-30 DIAGNOSIS — M0600 Rheumatoid arthritis without rheumatoid factor, unspecified site: Secondary | ICD-10-CM

## 2024-06-01 NOTE — Telephone Encounter (Signed)
 Last Fill: 03/04/2024  Labs: 01/09/2024 Lipid panel is normal.  Glucose is elevated, creatinine stable.  Hemoglobin is low and stable.   Next Visit: 08/24/2024  Last Visit: 03/19/2024  DX:  Seronegative rheumatoid arthritis   Current Dose per office note on 03/19/2024: Arava  20 mg 1 tablet by mouth daily   Advised patient she is overdue for labs, patient will come in one day this week. Standing orders are in place.   Okay to refill 30 day supply of Arava  ?

## 2024-06-02 ENCOUNTER — Other Ambulatory Visit: Payer: Self-pay

## 2024-06-02 ENCOUNTER — Telehealth: Payer: Self-pay | Admitting: Pharmacist

## 2024-06-02 DIAGNOSIS — Z79899 Other long term (current) drug therapy: Secondary | ICD-10-CM

## 2024-06-02 LAB — COMPREHENSIVE METABOLIC PANEL WITH GFR
AG Ratio: 1.3 (calc) (ref 1.0–2.5)
ALT: 16 U/L (ref 6–29)
AST: 21 U/L (ref 10–35)
Albumin: 4.3 g/dL (ref 3.6–5.1)
Alkaline phosphatase (APISO): 99 U/L (ref 37–153)
BUN/Creatinine Ratio: 34 (calc) — ABNORMAL HIGH (ref 6–22)
BUN: 41 mg/dL — ABNORMAL HIGH (ref 7–25)
CO2: 24 mmol/L (ref 20–32)
Calcium: 9.8 mg/dL (ref 8.6–10.4)
Chloride: 101 mmol/L (ref 98–110)
Creat: 1.2 mg/dL — ABNORMAL HIGH (ref 0.60–0.95)
Globulin: 3.4 g/dL (ref 1.9–3.7)
Glucose, Bld: 149 mg/dL — ABNORMAL HIGH (ref 65–99)
Potassium: 4.6 mmol/L (ref 3.5–5.3)
Sodium: 134 mmol/L — ABNORMAL LOW (ref 135–146)
Total Bilirubin: 0.5 mg/dL (ref 0.2–1.2)
Total Protein: 7.7 g/dL (ref 6.1–8.1)
eGFR: 46 mL/min/1.73m2 — ABNORMAL LOW (ref >=60)

## 2024-06-02 LAB — CBC WITH DIFFERENTIAL/PLATELET
Absolute Lymphocytes: 1764 {cells}/uL (ref 850–3900)
Absolute Monocytes: 916 {cells}/uL (ref 200–950)
Basophils Absolute: 42 {cells}/uL (ref 0–200)
Basophils Relative: 0.5 %
Eosinophils Absolute: 227 {cells}/uL (ref 15–500)
Eosinophils Relative: 2.7 %
HCT: 36 % (ref 35.0–45.0)
Hemoglobin: 11.3 g/dL — ABNORMAL LOW (ref 11.7–15.5)
MCH: 28.3 pg (ref 27.0–33.0)
MCHC: 31.4 g/dL — ABNORMAL LOW (ref 32.0–36.0)
MCV: 90.2 fL (ref 80.0–100.0)
MPV: 9.7 fL (ref 7.5–12.5)
Monocytes Relative: 10.9 %
Neutro Abs: 5452 {cells}/uL (ref 1500–7800)
Neutrophils Relative %: 64.9 %
Platelets: 346 Thousand/uL (ref 140–400)
RBC: 3.99 Million/uL (ref 3.80–5.10)
RDW: 14 % (ref 11.0–15.0)
Total Lymphocyte: 21 %
WBC: 8.4 Thousand/uL (ref 3.8–10.8)

## 2024-06-02 NOTE — Telephone Encounter (Signed)
 Freestyle Libre 2 device placed in patient's anterior right arm. She states the sensor fell off when she was dressing this morning  NDC: [phone] Lot: [id number] Unclear exp date  Sherry Pennant, PharmD, MPH, BCPS, CPP Clinical Pharmacist [facility])

## 2024-06-03 ENCOUNTER — Ambulatory Visit: Payer: Self-pay | Admitting: Physician Assistant

## 2024-06-03 DIAGNOSIS — Z79899 Other long term (current) drug therapy: Secondary | ICD-10-CM

## 2024-06-03 MED ORDER — LEFLUNOMIDE 10 MG PO TABS: 10.0000 mg | ORAL_TABLET | Freq: Every day | ORAL | Status: DC

## 2024-06-03 NOTE — Progress Notes (Signed)
 Glucose is elevated-149.  Creatinine is elevated-1.20 and GFR is low-46. Please clarify if she has had any medication changes? NSAID use?  Rest of CMP WNL Hemoglobin is low but stable--11.3.  MCHC low but improved.  Rest of CBC WNL.

## 2024-06-03 NOTE — Progress Notes (Signed)
 Recommend reducing arava  to 10 mg daily--recheck BMP with GFR in 2-3 weeks.

## 2024-06-08 ENCOUNTER — Other Ambulatory Visit: Payer: Self-pay | Admitting: Rheumatology

## 2024-06-08 DIAGNOSIS — M0600 Rheumatoid arthritis without rheumatoid factor, unspecified site: Secondary | ICD-10-CM

## 2024-06-08 NOTE — Telephone Encounter (Signed)
 Last Fill: 04/01/2024  Eye exam: 07/18/2023    Labs: 06/02/2024 Glucose is elevated-149.  Creatinine is elevated-1.20 and GFR is low-46. Please clarify if she has had any medication changes? NSAID use?  Rest of CMP WNL Hemoglobin is low but stable--11.3.  MCHC low but improved.  Rest of CBC WNL.   Patient states she has not had any medication changes or using any NSAIDS.  Recommend reducing arava  to 10 mg daily--recheck BMP with GFR in 2-3 weeks.   Next Visit: 08/24/2024  Last Visit: 03/19/2024  IK:Dzmnwzhjupcz rheumatoid arthritis   Current Dose per office note on 03/19/2024: Plaquenil  200 mg 1 tablet daily Monday through Friday and half tab on Saturday and sundays.   Okay to refill Plaquenil ?

## 2024-06-29 ENCOUNTER — Other Ambulatory Visit: Payer: Self-pay | Admitting: Physician Assistant

## 2024-06-29 ENCOUNTER — Other Ambulatory Visit: Payer: Self-pay

## 2024-06-29 DIAGNOSIS — M0600 Rheumatoid arthritis without rheumatoid factor, unspecified site: Secondary | ICD-10-CM

## 2024-06-29 MED ORDER — LEFLUNOMIDE 10 MG PO TABS: 10.0000 mg | ORAL_TABLET | Freq: Every day | ORAL | 0 refills | Status: DC

## 2024-06-29 NOTE — Telephone Encounter (Signed)
 Last Fill: 06/03/2024 no print placed 07/02/2024 last fill  Labs: 06/02/2024 Glucose is elevated-149.  Creatinine is elevated-1.20 and GFR is low-46. Please clarify if she has had any medication changes? NSAID use?  Rest of CMP WNL Hemoglobin is low but stable--11.3.  MCHC low but improved.  Rest of CBC WNL.  Recommend reducing arava  to 10 mg daily--recheck BMP with GFR in 2-3 weeks.   Next Visit: 08/24/2024  Last Visit: 03/19/2024  DX: Seronegative rheumatoid arthritis (HCC)   Current Dose per office note 03/19/2024: Arava  20 mg 1 tablet by mouth daily   Per lab note on 06/02/2024: Recommend reducing arava  to 10 mg daily  Okay to refill Arava ?

## 2024-07-20 ENCOUNTER — Other Ambulatory Visit: Payer: Self-pay | Admitting: *Deleted

## 2024-07-20 MED ORDER — LEFLUNOMIDE 10 MG PO TABS: 10.0000 mg | ORAL_TABLET | Freq: Every day | ORAL | 0 refills | Status: DC

## 2024-07-20 NOTE — Telephone Encounter (Signed)
 Patient contacted the office and states she was only given a 30 day supply from the pharmacy and they advised her she has not refills. Patient states she would prefer to have her prescription sent to [facility]  Last Fill: 06/29/2024  Labs: 06/02/2024 Glucose is elevated-149. Creatinine is elevated-1.20 and GFR is low-46. Rest of CMP WNL  Hemoglobin is low but stable--11.3.  MCHC low but improved.  Rest of CBC WNL  Next Visit: 08/24/2024  Last Visit: 03/19/2024  DX: Seronegative rheumatoid arthritis   Current Dose per  lab note 06/02/2024: Recommend reducing arava  to 10 mg daily   Okay to refill Arava  ?

## 2024-08-10 NOTE — Progress Notes (Unsigned)
 "  Office Visit Note  Patient: Deborah Travis             Date of Birth: 1940/10/30           MRN: [id number]             PCP: Samie Frederick, PA-C Referring: Samie Frederick, PA-C Visit Date: 08/24/2024 Occupation: Data Unavailable  Subjective:  Medication monitoring   History of Present Illness: Deborah Travis is a 83 y.o. female with history of seronegative rheumatoid arthritis and osteoarthritis.  Patient remains on Arava  20 mg 1 tablet by mouth daily and Plaquenil  200 mg 1 tablet daily Monday through Friday and half tab on Saturday and sundays.  She is tolerating combination therapy without any side effects and has not had any gaps in therapy.  Patient states that she recently noticed a knot on the right thumb which was painful and has contributed to some weakness in the right thumb.  Patient states these symptoms have resolved but she has noticed some increased prominence of the right thumb since then.  She denies any other joint pain or joint swelling at this time.  Patient states that she has been started on Fosamax 70 mg 1 tablet by mouth once weekly for management of osteoporosis.  She has also been taking calcium  and vitamin D  supplement daily. She denies any recent or recurrent infections.    Activities of Daily Living:  Patient reports morning stiffness for about 5 minutes.   Patient Denies nocturnal pain.  Difficulty dressing/grooming: Denies Difficulty climbing stairs: Denies Difficulty getting out of chair: Denies Difficulty using hands for taps, buttons, cutlery, and/or writing: Reports  Review of Systems  Constitutional:  Negative for fatigue.  HENT:  Negative for mouth sores and mouth dryness.   Eyes:  Negative for dryness.  Respiratory:  Negative for shortness of breath.   Cardiovascular:  Negative for chest pain and palpitations.  Gastrointestinal:  Negative for blood in stool, constipation and diarrhea.  Endocrine: Negative for increased urination.  Genitourinary:   Negative for involuntary urination.  Musculoskeletal:  Positive for joint pain, gait problem, joint pain, joint swelling and morning stiffness. Negative for myalgias, muscle weakness, muscle tenderness and myalgias.  Skin:  Negative for color change, rash, hair loss and sensitivity to sunlight.  Allergic/Immunologic: Negative for susceptible to infections.  Neurological:  Negative for dizziness and headaches.  Hematological:  Negative for swollen glands.  Psychiatric/Behavioral:  Positive for sleep disturbance. Negative for depressed mood. The patient is not nervous/anxious.     PMFS History:  Patient Active Problem List   Diagnosis Date Noted   Thickened endometrium 04/10/2023   Aortic atherosclerosis 06/23/2021   Diabetic nephropathy associated with type 2 diabetes mellitus (HCC) 02/06/2021   Stage 3b chronic kidney disease (HCC) 02/06/2021   Chronic calcific pancreatitis (HCC) 08/12/2020   Atherosclerosis 08/08/2018   Glaucoma 05/26/2018   Hypertensive retinopathy 05/26/2018   Seronegative rheumatoid arthritis (HCC) 03/04/2018   Senile purpura 02/01/2015   Former smoker 10/12/2013   History of peptic ulcer disease 07/13/2013   Osteopenia 05/07/2013   Hypothyroidism 01/12/2012   Type 2 diabetes with complication (HCC) 05/21/2011   Hypertension associated with diabetes (HCC) 05/21/2011   Exocrine pancreatic insufficiency 05/21/2011   Hyperlipidemia associated with type 2 diabetes mellitus (HCC) 05/21/2011    Past Medical History:  Diagnosis Date   Arthritis    Chronic kidney disease    RENAL STONE   Chronic pancreatitis (HCC)    Complication of anesthesia  slow to awaken   Diabetes mellitus Type 2    Duodenal ulcer    Dyslipidemia    GERD (gastroesophageal reflux disease)    Hypertension    Osteopenia    Thyroid  disease    HYPOTHYROID   Vitamin D  deficiency    Wears hearing aid in both ears     Family History  Problem Relation Age of Onset   Stroke Mother     Heart disease Father    Healthy Daughter    Healthy Daughter    Emphysema Daughter    Diabetes Daughter    Hernia Daughter    Past Surgical History:  Procedure Laterality Date   APPENDECTOMY     yrs ago, as child   CATARACT EXTRACTION Right 08/27/2012   CHOLECYSTECTOMY     yrs ago   HYSTEROSCOPY WITH D & C N/A 02/14/2023   Procedure: DILATATION AND CURETTAGE;  Surgeon: Sudie Lavonia HERO, MD;  Location: [facility];  Service: Gynecology;  Laterality: N/A;   ROBOTIC ASSISTED TOTAL HYSTERECTOMY WITH BILATERAL SALPINGO OOPHERECTOMY N/A 04/10/2023   Procedure: XI ROBOTIC ASSISTED TOTAL HYSTERECTOMY WITH BILATERAL SALPINGO OOPHORECTOMY;  Surgeon: Viktoria Comer SAUNDERS, MD;  Location: [facility];  Service: Gynecology;  Laterality: N/A;   TONSILLECTOMY AND ADENOIDECTOMY     as child   TUBAL LIGATION     age 17   WHIPPLE PROCEDURE     1998 or 28   Social History[1] Social History   Social History Narrative   Not on file     Immunization History  Administered Date(s) Administered   Fluad Quad(high Dose 65+) 06/01/2019, 05/10/2020, 06/23/2021, 05/04/2022   INFLUENZA, HIGH DOSE SEASONAL PF 07/13/2013, 07/01/2014, 06/01/2015, 06/15/2016, 04/15/2017, 05/19/2018   Influenza Split 05/21/2011, 06/12/2012, 07/15/2013   PFIZER Comirnaty(Gray Top)Covid-19 Tri-Sucrose Vaccine 01/25/2021   PFIZER(Purple Top)SARS-COV-2 Vaccination 09/18/2019, 10/09/2019   Pneumococcal Conjugate-13 06/15/2016   Pneumococcal Polysaccharide-23 06/12/2012   Tdap 06/28/2011   Zoster, Live 06/27/2013     Objective: Vital Signs: BP 112/72   Pulse 80   Temp 98.3 F (36.8 C)   Resp 14   Ht 5' 2 (1.575 m)   Wt 119 lb 12.8 oz (54.3 kg)   LMP  (LMP Unknown)   BMI 21.91 kg/m    Physical Exam Vitals and nursing note reviewed.  Constitutional:      Appearance: She is well-developed.  HENT:     Head: Normocephalic and atraumatic.  Eyes:     Conjunctiva/sclera: Conjunctivae normal.   Cardiovascular:     Rate and Rhythm: Normal rate and regular rhythm.     Heart sounds: Normal heart sounds.  Pulmonary:     Effort: Pulmonary effort is normal.     Breath sounds: Normal breath sounds.  Abdominal:     General: Bowel sounds are normal.     Palpations: Abdomen is soft.  Musculoskeletal:     Cervical back: Normal range of motion.  Lymphadenopathy:     Cervical: No cervical adenopathy.  Skin:    General: Skin is warm and dry.     Capillary Refill: Capillary refill takes less than 2 seconds.  Neurological:     Mental Status: She is alert and oriented to person, place, and time.  Psychiatric:        Behavior: Behavior normal.      Musculoskeletal Exam: C-spine has slightly limited range of motion with lateral rotation.  Thoracic and lumbar spine have good range of motion.  No midline spinal tenderness.  Shoulder joints,  elbow joints, wrist joints have good range of motion with no discomfort.  CMC, PIP, DIP thickening consistent with osteoarthritis of both hands.  Thickening and subluxation of bilateral first MCP joints noted.  Hip joints have good range of motion with no groin pain.  Knee joints have good range of motion no warmth or effusion.  No tenderness or swelling of ankle joints.  CDAI Exam: CDAI Score: -- Patient Global: --; Provider Global: -- Swollen: --; Tender: -- Joint Exam 08/24/2024   No joint exam has been documented for this visit   There is currently no information documented on the homunculus. Go to the Rheumatology activity and complete the homunculus joint exam.  Investigation: No additional findings.  Imaging: No results found.  Recent Labs: Lab Results  Component Value Date   WBC 8.4 06/02/2024   HGB 11.3 (L) 06/02/2024   PLT 346 06/02/2024   NA 134 (L) 06/02/2024   K 4.6 06/02/2024   CL 101 06/02/2024   CO2 24 06/02/2024   GLUCOSE 149 (H) 06/02/2024   BUN 41 (H) 06/02/2024   CREATININE 1.20 (H) 06/02/2024   BILITOT 0.5  06/02/2024   ALKPHOS 98 03/29/2023   AST 21 06/02/2024   ALT 16 06/02/2024   PROT 7.7 06/02/2024   ALBUMIN 3.3 (L) 03/29/2023   CALCIUM  9.8 06/02/2024   GFRAA 40 (L) 01/27/2021   QFTBGOLDPLUS NEGATIVE 03/30/2019    Speciality Comments: PLQ Eye Exam:07/18/2023 WNL @ [facility] Follow up in 6 months   Contacted [facility] and patient had exam on 03/02/2024, waiting to received faxed eye exam.   Procedures:  No procedures performed Allergies: Levofloxacin  and Codeine   Assessment / Plan:     Visit Diagnoses: Seronegative rheumatoid arthritis (HCC) - RF-,CCP-, 14-3-3 eta negative, Uric acid 4.8, ANA negative, no erosive changes on XR of hands: She has no synovitis on examination today.  She does not have any signs or symptoms of a rheumatoid arthritis flare.  She has clinically been doing well taking Arava  20 mg 1 tablet by mouth daily and Plaquenil  200 mg 1 tablet daily Monday through Friday and half tablet on Saturday and Sundays.  She is tolerating combination therapy without any side effects and has not had any gaps in therapy.  No recent or recurrent infections.  No medication changes will be made at this time.  She was advised to notify us  if she develops signs or symptoms of a flare.  She will follow up in 5 months or sooner if needed.   High risk medication use - Arava  20 mg 1 tablet by mouth daily, Plaquenil  200 mg 1 tablet daily Monday through Friday and half tab on Saturday and sundays.  CBC and CMP updated on 06/02/24. Orders for CBC and CMP released today.  No recent or recurrent infections. Discussed the importance of holding arava  if she develops signs or symptoms an infection and to resume once the infection has completely cleared.  PLQ Eye Exam:07/18/2023 WNL @ [facility]. We will call for most recent plaquenil  eye exam.   - Plan: CBC with Differential/Platelet, Comprehensive metabolic panel with GFR  Trochanteric bursitis of both hips: Not currently  symptomatic.  DDD (degenerative disc disease), cervical: C-spine has limited ROM with lateral rotation.   Osteopenia of multiple sites - DEXA on 02/18/18: The BMD measured at [facility] is 0.700 g/cm2 with a T-score of -2.4.   She has had an updated bone density ordered by her PCP.  She has been initiated on Fosamax 70 mg 1 tablet by mouth once weekly.  She is also taking calcium  and vitamin D  supplement daily.  Vitamin D  deficiency: She is taking a calcium  and vitamin D  supplement daily.  Other medical conditions are listed as follows:  Hyperlipidemia associated with type 2 diabetes mellitus (HCC)  Hypertension associated with diabetes (HCC)  History of chronic pancreatitis  Senile purpura  History of peptic ulcer disease  History of gastroesophageal reflux (GERD)  History of hypothyroidism  Former smoker  Orders: Orders Placed This Encounter  Procedures   CBC with Differential/Platelet   Comprehensive metabolic panel with GFR   No orders of the defined types were placed in this encounter.   Follow-Up Instructions: Return in about 5 months (around 01/22/2025) for Rheumatoid arthritis, Osteoarthritis.   Waddell CHRISTELLA Craze, PA-C  Note - This record has been created using Dragon software.  Chart creation errors have been sought, but may not always  have been located. Such creation errors do not reflect on  the standard of medical care.     [1]  Social History Tobacco Use   Smoking status: Former    Current packs/day: 0.00    Types: Cigarettes    Quit date: 06/2023    Years since quitting: 1.1    Passive exposure: Past   Smokeless tobacco: Never  Vaping Use   Vaping status: Never Used  Substance Use Topics   Alcohol use: No    Alcohol/week: 0.0 standard drinks of alcohol   Drug use: No   "

## 2024-08-16 ENCOUNTER — Other Ambulatory Visit: Payer: Self-pay | Admitting: Physician Assistant

## 2024-08-16 DIAGNOSIS — M0600 Rheumatoid arthritis without rheumatoid factor, unspecified site: Secondary | ICD-10-CM

## 2024-08-17 NOTE — Telephone Encounter (Signed)
 Last Fill: 06/08/2024  Eye exam: 07/18/2023 WNL   Labs: 06/02/2024 Glucose is elevated-149.  Creatinine is elevated-1.20 and GFR is low-46. Please clarify if she has had any medication changes? NSAID use?  Rest of CMP WNL Hemoglobin is low but stable--11.3.  MCHC low but improved.  Rest of CBC WNL.   Next Visit: 08/24/2024  Last Visit: 03/19/2024  IK:Dzmnwzhjupcz rheumatoid arthritis (HCC)   Current Dose per office note 03/19/2024: Plaquenil  200 mg 1 tablet daily Monday through Friday and half tab on Saturday and sundays.   Contacted the patient and advised she is due to update her eye exam. Patient states she is not home right now and is unable to tell if she has had one this year or if she has one scheduled. Patient states she will let us  know when she comes in for her appointment next week.   Okay to refill Plaquenil ?

## 2024-08-24 ENCOUNTER — Encounter: Payer: Self-pay | Admitting: Physician Assistant

## 2024-08-24 ENCOUNTER — Ambulatory Visit: Admitting: Physician Assistant

## 2024-08-24 VITALS — BP 112/72 | HR 80 | Temp 98.3°F | Resp 14 | Ht 62.0 in | Wt 119.8 lb

## 2024-08-24 DIAGNOSIS — I152 Hypertension secondary to endocrine disorders: Secondary | ICD-10-CM

## 2024-08-24 DIAGNOSIS — M5030 Other cervical disc degeneration, unspecified cervical region: Secondary | ICD-10-CM

## 2024-08-24 DIAGNOSIS — Z79899 Other long term (current) drug therapy: Secondary | ICD-10-CM | POA: Diagnosis not present

## 2024-08-24 DIAGNOSIS — M7061 Trochanteric bursitis, right hip: Secondary | ICD-10-CM | POA: Diagnosis not present

## 2024-08-24 DIAGNOSIS — E785 Hyperlipidemia, unspecified: Secondary | ICD-10-CM

## 2024-08-24 DIAGNOSIS — M8589 Other specified disorders of bone density and structure, multiple sites: Secondary | ICD-10-CM | POA: Diagnosis not present

## 2024-08-24 DIAGNOSIS — E1169 Type 2 diabetes mellitus with other specified complication: Secondary | ICD-10-CM | POA: Diagnosis not present

## 2024-08-24 DIAGNOSIS — M0600 Rheumatoid arthritis without rheumatoid factor, unspecified site: Secondary | ICD-10-CM | POA: Diagnosis not present

## 2024-08-24 DIAGNOSIS — Z8711 Personal history of peptic ulcer disease: Secondary | ICD-10-CM

## 2024-08-24 DIAGNOSIS — D692 Other nonthrombocytopenic purpura: Secondary | ICD-10-CM

## 2024-08-24 DIAGNOSIS — Z8639 Personal history of other endocrine, nutritional and metabolic disease: Secondary | ICD-10-CM

## 2024-08-24 DIAGNOSIS — E1159 Type 2 diabetes mellitus with other circulatory complications: Secondary | ICD-10-CM

## 2024-08-24 DIAGNOSIS — M7062 Trochanteric bursitis, left hip: Secondary | ICD-10-CM

## 2024-08-24 DIAGNOSIS — Z87891 Personal history of nicotine dependence: Secondary | ICD-10-CM

## 2024-08-24 DIAGNOSIS — E559 Vitamin D deficiency, unspecified: Secondary | ICD-10-CM

## 2024-08-24 DIAGNOSIS — Z8719 Personal history of other diseases of the digestive system: Secondary | ICD-10-CM

## 2024-08-24 NOTE — Patient Instructions (Signed)
 Standing Labs We placed an order today for your standing lab work.   Please have your standing labs drawn in early January and every 3 months   Please have your labs drawn 2 weeks prior to your appointment so that the provider can discuss your lab results at your appointment, if possible.  Please note that you may see your imaging and lab results in MyChart before we have reviewed them. We will contact you once all results are reviewed. Please allow our office up to 72 hours to thoroughly review all of the results before contacting the office for clarification of your results.  WALK-IN LAB HOURS  Monday through Thursday from 8:00 am - 4:30 pm and Friday from 8:00 am-12:00 pm.  Patients with office visits requiring labs will be seen before walk-in labs.  You may encounter longer than normal wait times. Please allow additional time. Wait times may be shorter on  Monday and Thursday afternoons.  We do not book appointments for walk-in labs. We appreciate your patience and understanding with our staff.   Labs are drawn by [facility]. Please bring your co-pay at the time of your lab draw.  You may receive a bill from [facility] for your lab work.  Please note if you are on Hydroxychloroquine  and and an order has been placed for a Hydroxychloroquine  level,  you will need to have it drawn 4 hours or more after your last dose.  If you wish to have your labs drawn at another location, please call the office 24 hours in advance so we can fax the orders.  The office is located at [address], [place], [place] [address]   If you have any questions regarding directions or hours of operation,  please call [phone].   As a reminder, please drink plenty of water  prior to coming for your lab work. Thanks!

## 2024-09-09 ENCOUNTER — Other Ambulatory Visit: Payer: Self-pay | Admitting: Physician Assistant

## 2024-09-09 DIAGNOSIS — M0600 Rheumatoid arthritis without rheumatoid factor, unspecified site: Secondary | ICD-10-CM

## 2024-09-09 NOTE — Telephone Encounter (Signed)
 Last Fill: 08/17/2024  Eye exam: 03/02/2024 WNL   Labs: 06/02/2024 Glucose is elevated-149.  Creatinine is elevated-1.20 and GFR is low-46. Please clarify if she has had any medication changes? NSAID use?  Rest of CMP WNL Hemoglobin is low but stable--11.3.  MCHC low but improved.  Rest of CBC WNL.   Next Visit: 01/20/2025  Last Visit: 08/24/2024  DX: Seronegative rheumatoid arthritis   Current Dose per office note 08/24/2024: Plaquenil  200 mg 1 tablet daily Monday through Friday and half tab on Saturday and sundays   Okay to refill Plaquenil ?

## 2024-09-26 ENCOUNTER — Other Ambulatory Visit: Payer: Self-pay | Admitting: Physician Assistant

## 2024-09-28 NOTE — Telephone Encounter (Signed)
 Last Fill: 07/20/2024  Labs: 06/02/2024 Glucose is elevated-149. Creatinine is elevated-1.20 and GFR is low-46 Rest of CMP WNL. Hemoglobin is low but stable--11.3.  MCHC low but improved.  Rest of CBC WNL.   Next Visit: 01/20/2025  Last Visit: 08/24/2024  DX: Seronegative rheumatoid arthritis   Current Dose per lab note 06/02/2025: Recommend reducing arava  to 10 mg daily   Patient advised she is due to update her lab work. Patient states she will come to update once weather permits.   Okay to refill Arava  ?

## 2025-01-20 ENCOUNTER — Ambulatory Visit: Admitting: Rheumatology
